# Patient Record
Sex: Female | Born: 1940 | Race: White | Hispanic: No | Marital: Married | Smoking: Former smoker
Health system: Southern US, Community
[De-identification: ages and names within clinical notes are randomized; demographics above are authoritative.]

## PROBLEM LIST (undated history)

## (undated) DIAGNOSIS — G4733 Obstructive sleep apnea (adult) (pediatric): Secondary | ICD-10-CM

## (undated) DIAGNOSIS — T7840XA Allergy, unspecified, initial encounter: Secondary | ICD-10-CM

## (undated) DIAGNOSIS — H269 Unspecified cataract: Secondary | ICD-10-CM

## (undated) DIAGNOSIS — Z85828 Personal history of other malignant neoplasm of skin: Secondary | ICD-10-CM

## (undated) DIAGNOSIS — H409 Unspecified glaucoma: Secondary | ICD-10-CM

## (undated) DIAGNOSIS — F419 Anxiety disorder, unspecified: Secondary | ICD-10-CM

## (undated) DIAGNOSIS — H9190 Unspecified hearing loss, unspecified ear: Secondary | ICD-10-CM

## (undated) DIAGNOSIS — K219 Gastro-esophageal reflux disease without esophagitis: Secondary | ICD-10-CM

## (undated) DIAGNOSIS — H35039 Hypertensive retinopathy, unspecified eye: Secondary | ICD-10-CM

## (undated) DIAGNOSIS — F32A Depression, unspecified: Secondary | ICD-10-CM

## (undated) DIAGNOSIS — E785 Hyperlipidemia, unspecified: Secondary | ICD-10-CM

## (undated) DIAGNOSIS — G43909 Migraine, unspecified, not intractable, without status migrainosus: Secondary | ICD-10-CM

## (undated) DIAGNOSIS — G4730 Sleep apnea, unspecified: Secondary | ICD-10-CM

## (undated) DIAGNOSIS — H9319 Tinnitus, unspecified ear: Secondary | ICD-10-CM

## (undated) DIAGNOSIS — F329 Major depressive disorder, single episode, unspecified: Secondary | ICD-10-CM

## (undated) DIAGNOSIS — Z9989 Dependence on other enabling machines and devices: Principal | ICD-10-CM

## (undated) HISTORY — DX: Personal history of other malignant neoplasm of skin: Z85.828

## (undated) HISTORY — DX: Unspecified cataract: H26.9

## (undated) HISTORY — DX: Unspecified glaucoma: H40.9

## (undated) HISTORY — DX: Migraine, unspecified, not intractable, without status migrainosus: G43.909

## (undated) HISTORY — DX: Sleep apnea, unspecified: G47.30

## (undated) HISTORY — DX: Depression, unspecified: F32.A

## (undated) HISTORY — DX: Tinnitus, unspecified ear: H93.19

## (undated) HISTORY — PX: EYE SURGERY: SHX253

## (undated) HISTORY — DX: Hypertensive retinopathy, unspecified eye: H35.039

## (undated) HISTORY — DX: Gastro-esophageal reflux disease without esophagitis: K21.9

## (undated) HISTORY — DX: Anxiety disorder, unspecified: F41.9

## (undated) HISTORY — PX: CATARACT EXTRACTION: SUR2

## (undated) HISTORY — DX: Dependence on other enabling machines and devices: Z99.89

## (undated) HISTORY — DX: Unspecified hearing loss, unspecified ear: H91.90

## (undated) HISTORY — PX: OTHER SURGICAL HISTORY: SHX169

## (undated) HISTORY — DX: Allergy, unspecified, initial encounter: T78.40XA

## (undated) HISTORY — DX: Hyperlipidemia, unspecified: E78.5

## (undated) HISTORY — PX: COSMETIC SURGERY: SHX468

## (undated) HISTORY — DX: Major depressive disorder, single episode, unspecified: F32.9

## (undated) HISTORY — DX: Obstructive sleep apnea (adult) (pediatric): G47.33

---

## 1998-10-28 ENCOUNTER — Other Ambulatory Visit: Admission: RE | Admit: 1998-10-28 | Discharge: 1998-10-28 | Payer: Self-pay | Admitting: Obstetrics and Gynecology

## 1999-09-23 ENCOUNTER — Other Ambulatory Visit: Admission: RE | Admit: 1999-09-23 | Discharge: 1999-09-23 | Payer: Self-pay | Admitting: Obstetrics and Gynecology

## 2000-04-26 ENCOUNTER — Ambulatory Visit (HOSPITAL_COMMUNITY): Admission: RE | Admit: 2000-04-26 | Discharge: 2000-04-26 | Payer: Self-pay | Admitting: Gastroenterology

## 2000-09-27 ENCOUNTER — Other Ambulatory Visit: Admission: RE | Admit: 2000-09-27 | Discharge: 2000-09-27 | Payer: Self-pay | Admitting: Obstetrics and Gynecology

## 2001-10-06 ENCOUNTER — Other Ambulatory Visit: Admission: RE | Admit: 2001-10-06 | Discharge: 2001-10-06 | Payer: Self-pay | Admitting: Obstetrics and Gynecology

## 2002-05-18 ENCOUNTER — Ambulatory Visit (HOSPITAL_COMMUNITY): Admission: RE | Admit: 2002-05-18 | Discharge: 2002-05-18 | Payer: Self-pay | Admitting: Neurology

## 2002-05-22 ENCOUNTER — Ambulatory Visit (HOSPITAL_COMMUNITY): Admission: RE | Admit: 2002-05-22 | Discharge: 2002-05-22 | Payer: Self-pay | Admitting: Ophthalmology

## 2002-05-22 ENCOUNTER — Encounter: Payer: Self-pay | Admitting: Ophthalmology

## 2002-11-08 ENCOUNTER — Other Ambulatory Visit: Admission: RE | Admit: 2002-11-08 | Discharge: 2002-11-08 | Payer: Self-pay | Admitting: Obstetrics and Gynecology

## 2003-11-21 ENCOUNTER — Other Ambulatory Visit: Admission: RE | Admit: 2003-11-21 | Discharge: 2003-11-21 | Payer: Self-pay | Admitting: Obstetrics and Gynecology

## 2004-12-25 ENCOUNTER — Other Ambulatory Visit: Admission: RE | Admit: 2004-12-25 | Discharge: 2004-12-25 | Payer: Self-pay | Admitting: Obstetrics and Gynecology

## 2005-08-28 ENCOUNTER — Ambulatory Visit (HOSPITAL_COMMUNITY): Admission: RE | Admit: 2005-08-28 | Discharge: 2005-08-28 | Payer: Self-pay | Admitting: Gastroenterology

## 2005-12-29 ENCOUNTER — Other Ambulatory Visit: Admission: RE | Admit: 2005-12-29 | Discharge: 2005-12-29 | Payer: Self-pay | Admitting: Obstetrics and Gynecology

## 2007-01-19 ENCOUNTER — Other Ambulatory Visit: Admission: RE | Admit: 2007-01-19 | Discharge: 2007-01-19 | Payer: Self-pay | Admitting: Obstetrics and Gynecology

## 2008-02-17 ENCOUNTER — Other Ambulatory Visit: Admission: RE | Admit: 2008-02-17 | Discharge: 2008-02-17 | Payer: Self-pay | Admitting: Gynecology

## 2009-02-27 ENCOUNTER — Other Ambulatory Visit: Admission: RE | Admit: 2009-02-27 | Discharge: 2009-02-27 | Payer: Self-pay | Admitting: Obstetrics and Gynecology

## 2011-03-20 NOTE — Op Note (Signed)
NAME:  Nicole Holt, Nicole Holt                 ACCOUNT NO.:  [id number]   MEDICAL RECORD NO.:  [id number]          PATIENT TYPE:  AMB   LOCATION:  ENDO                         FACILITY:  [facility]   PHYSICIAN:  John C. Madilyn Fireman, M.D.    DATE OF BIRTH:  02-Jul-1941   DATE OF PROCEDURE:  08/28/2005  DATE OF DISCHARGE:                                 OPERATIVE REPORT   PROCEDURE:  Colonoscopy.   INDICATIONS FOR PROCEDURE:  Family history of colon cancer in a first-degree  relative.   PROCEDURE:  The patient was placed in the left lateral decubitus position  then placed on the pulse monitor with continuous low-flow oxygen delivered  by nasal cannula. She was sedated with 75 mcg IV fentanyl  and 7 mg IV  Versed. The Olympus video colonoscope was inserted into the rectum and  advanced to the cecum, confirmed by transillumination at McBurney's point  and visualization of the ileocecal valve and appendiceal orifice. The prep  was excellent. The cecum, ascending, and transverse colon all appeared  normal with no masses, polyps, diverticula or other mucosal abnormalities.  There were a few scattered diverticula noted in the descending and sigmoid  colon which were otherwise normal. The rectum appeared normal. On  retroflexed view, the anus revealed no obvious internal hemorrhoids. The  scope was then withdrawn and the patient returned to the recovery room in  stable condition. She tolerated the procedure well and there were no  immediate complications.   IMPRESSION:  Diverticulosis otherwise normal study.   PLAN:  Repeat colonoscopy in 5 years.           ______________________________  Everardo All Madilyn Fireman, M.D.     J[facility]/MEDQ  D:  08/28/2005  T:  08/28/2005  Job:  [id number]   cc:   Harrel Lemon. Merla Riches, M.D.  Fax: [phone]

## 2011-03-20 NOTE — Procedures (Signed)
[facility]  Patient:    Nicole Holt, Nicole Holt                        MRN: [id number] Proc. Date: 04/26/00 Adm. Date:  [id number] Attending:  Louie Bun CC:         Edwena Felty. Ashley Royalty, M.D.                           Procedure Report  PROCEDURE PERFORMED:  Colonoscopy.  ENDOSCOPIST:  Everardo All. Madilyn Fireman, M.D.  INDICATION FOR PROCEDURE:  Intermittent rectal bleeding in a 70 year old patient with no prior colon screening.  DESCRIPTION OF PROCEDURE:  The patient was placed in the left lateral decubitus position and placed on the pulse monitor with continuous low-flow oxygen delivered by nasal cannula.  She was sedated with 75 mg IV Demerol and 7 mg IV Versed.  The Olympus video colonoscope was inserted into the rectum and advanced to the cecum, confirmed by transillumination of McBurneys point and visualization of the ileocecal valve and the appendiceal orifice.  The prep was excellent.  The cecum, ascending, transverse, descending and sigmoid colon all appeared normal with no masses, polyps, diverticula or other mucosal abnormalities.  The rectum likewise appeared normal and a retroflexed view of the anus did reveal some small internal hemorrhoids.  The colonoscope was then withdrawn and the patient returned to the recovery room in stable condition. She tolerated the procedure well and there were no immediate complications.  IMPRESSION:  Internal hemorrhoid; otherwise, normal colonoscopy. DD:  04/26/00 TD:  04/27/00 Job: [id number]

## 2012-01-18 ENCOUNTER — Ambulatory Visit (INDEPENDENT_AMBULATORY_CARE_PROVIDER_SITE_OTHER): Payer: Medicare PPO | Admitting: Family Medicine

## 2012-01-18 VITALS — BP 140/80 | HR 86 | Temp 97.7°F | Resp 18 | Ht 62.75 in | Wt 156.4 lb

## 2012-01-18 DIAGNOSIS — J189 Pneumonia, unspecified organism: Secondary | ICD-10-CM

## 2012-01-18 MED ORDER — HYDROCODONE-HOMATROPINE 5-1.5 MG/5ML PO SYRP: 5.0000 mL | ORAL_SOLUTION | Freq: Four times a day (QID) | ORAL | Status: DC | PRN

## 2012-01-18 MED ORDER — DOXYCYCLINE HYCLATE 100 MG PO TABS: 100.0000 mg | ORAL_TABLET | Freq: Two times a day (BID) | ORAL | Status: AC

## 2012-01-18 NOTE — Progress Notes (Signed)
2 weeks ago had N,V,D.  Was okay for a week.  Then last week developed myalgias, cough, and chills.  Associated with sore throat.  Cough is violent.  After the cough, patient feels exhausted.  She thinks that the temperature is going up at night.  She's using her hydromet from a year ago, and it is helping.  O:  NAD  Color good Oroph:  Clear TM's normal Neck:  Supple without adenopathy Chest:  Few wheezes, bibasilar rales.  Violent cough  A:  Bronchitis, early pneumonia  P:  Doxycycline 100 bid hydromet

## 2012-02-06 ENCOUNTER — Other Ambulatory Visit: Payer: Self-pay | Admitting: Internal Medicine

## 2012-06-09 ENCOUNTER — Encounter: Payer: Self-pay | Admitting: Obstetrics & Gynecology

## 2012-06-09 ENCOUNTER — Ambulatory Visit (INDEPENDENT_AMBULATORY_CARE_PROVIDER_SITE_OTHER): Payer: Self-pay | Admitting: Obstetrics & Gynecology

## 2012-06-09 VITALS — BP 138/88 | HR 81 | Temp 97.6°F | Ht 63.0 in | Wt 154.9 lb

## 2012-06-09 DIAGNOSIS — Z01419 Encounter for gynecological examination (general) (routine) without abnormal findings: Secondary | ICD-10-CM

## 2012-06-09 DIAGNOSIS — Z0000 Encounter for general adult medical examination without abnormal findings: Secondary | ICD-10-CM

## 2012-06-09 NOTE — Progress Notes (Signed)
Subjective:    Nicole Holt is a 71 y.o. female who presents for an annual exam. The patient has no complaints today. The patient is sexually active. GYN screening history: last pap: was normal. The patient wears seatbelts: yes. The patient participates in regular exercise: yes. Has the patient ever been transfused or tattooed?: not asked. The patient reports that there is not domestic violence in her life.   Menstrual History: OB History    Grav Para Term Preterm Abortions TAB SAB Ect Mult Living   2 2 2  0 0 0 0 0 0 2      Menarche age: 44 No LMP recorded. Patient is postmenopausal.    The following portions of the patient's history were reviewed and updated as appropriate: allergies, current medications, past family history, past medical history, past social history, past surgical history and problem list.  Review of Systems Pertinent items are noted in HPI.    Objective:    BP 138/88  Pulse 81  Temp 97.6 F (36.4 C)  Ht 5\' 3"  (1.6 m)  Wt 154 lb 14.4 oz (70.262 kg)  BMI 27.44 kg/m2  General Appearance:    Alert, cooperative, no distress, appears stated age  Head:    Normocephalic, without obvious abnormality, atraumatic  Eyes:    PERRL, conjunctiva/corneas clear, EOM's intact, fundi    benign, both eyes  Ears:    Normal TM's and external ear canals, both ears  Nose:   Nares normal, septum midline, mucosa normal, no drainage    or sinus tenderness  Throat:   Lips, mucosa, and tongue normal; teeth and gums normal  Neck:   Supple, symmetrical, trachea midline, no adenopathy;    thyroid:  no enlargement/tenderness/nodules; no carotid   bruit or JVD  Back:     Symmetric, no curvature, ROM normal, no CVA tenderness  Lungs:     Clear to auscultation bilaterally, respirations unlabored  Chest Wall:    No tenderness or deformity   Heart:    Regular rate and rhythm, S1 and S2 normal, no murmur, rub   or gallop  Breast Exam:    No tenderness, masses, or nipple abnormality    Abdomen:     Soft, non-tender, bowel sounds active all four quadrants,    no masses, no organomegaly  Genitalia:    Normal female without lesion, discharge or tenderness, moderate atrophy, NSSA, NT, no adnexal masses  Rectal:    Small hemorrhoid   Extremities:   Extremities normal, atraumatic, no cyanosis or edema  Pulses:   2+ and symmetric all extremities  Skin:   Skin color, texture, turgor normal, no rashes or lesions  Lymph nodes:   Cervical, supraclavicular, and axillary nodes normal  Neurologic:   CNII-XII intact, normal strength, sensation and reflexes    throughout  .    Assessment:    Healthy female exam.    Plan:     Mammogram.

## 2012-06-16 ENCOUNTER — Other Ambulatory Visit: Payer: Self-pay | Admitting: Internal Medicine

## 2012-06-16 NOTE — Telephone Encounter (Signed)
Please pull chart.

## 2012-06-16 NOTE — Telephone Encounter (Signed)
Patient's chart is at the nurses station in the pa pool.

## 2012-07-27 ENCOUNTER — Encounter: Payer: Self-pay | Admitting: Internal Medicine

## 2012-07-27 ENCOUNTER — Ambulatory Visit (INDEPENDENT_AMBULATORY_CARE_PROVIDER_SITE_OTHER): Payer: Medicare PPO | Admitting: Internal Medicine

## 2012-07-27 VITALS — BP 130/70 | HR 60 | Temp 97.8°F | Resp 16 | Ht 62.0 in | Wt 154.6 lb

## 2012-07-27 DIAGNOSIS — C4491 Basal cell carcinoma of skin, unspecified: Secondary | ICD-10-CM

## 2012-07-27 DIAGNOSIS — H8110 Benign paroxysmal vertigo, unspecified ear: Secondary | ICD-10-CM

## 2012-07-27 DIAGNOSIS — H409 Unspecified glaucoma: Secondary | ICD-10-CM

## 2012-07-27 DIAGNOSIS — H9319 Tinnitus, unspecified ear: Secondary | ICD-10-CM

## 2012-07-27 DIAGNOSIS — Z0000 Encounter for general adult medical examination without abnormal findings: Secondary | ICD-10-CM

## 2012-07-27 DIAGNOSIS — G2581 Restless legs syndrome: Secondary | ICD-10-CM

## 2012-07-27 DIAGNOSIS — Z6828 Body mass index (BMI) 28.0-28.9, adult: Secondary | ICD-10-CM

## 2012-07-27 DIAGNOSIS — G4730 Sleep apnea, unspecified: Secondary | ICD-10-CM

## 2012-07-27 DIAGNOSIS — F411 Generalized anxiety disorder: Secondary | ICD-10-CM

## 2012-07-27 DIAGNOSIS — H9190 Unspecified hearing loss, unspecified ear: Secondary | ICD-10-CM | POA: Insufficient documentation

## 2012-07-27 DIAGNOSIS — Z23 Encounter for immunization: Secondary | ICD-10-CM

## 2012-07-27 DIAGNOSIS — M810 Age-related osteoporosis without current pathological fracture: Secondary | ICD-10-CM

## 2012-07-27 DIAGNOSIS — F419 Anxiety disorder, unspecified: Secondary | ICD-10-CM

## 2012-07-27 LAB — COMPREHENSIVE METABOLIC PANEL
ALT: 17 U/L (ref 0–35)
AST: 21 U/L (ref 0–37)
BUN: 21 mg/dL (ref 6–23)
CO2: 25 mEq/L (ref 19–32)
Creat: 0.88 mg/dL (ref 0.50–1.10)
Total Bilirubin: 0.7 mg/dL (ref 0.3–1.2)

## 2012-07-27 LAB — LIPID PANEL
Cholesterol: 189 mg/dL (ref 0–200)
HDL: 51 mg/dL (ref >39)
Total CHOL/HDL Ratio: 3.7 Ratio
VLDL: 35 mg/dL (ref 0–40)

## 2012-07-27 LAB — CBC WITH DIFFERENTIAL/PLATELET
Basophils Absolute: 0 10*3/uL (ref 0.0–0.1)
Eosinophils Absolute: 0.1 10*3/uL (ref 0.0–0.7)
Eosinophils Relative: 1 % (ref 0–5)
MCH: 30.5 pg (ref 26.0–34.0)
MCV: 90.9 fL (ref 78.0–100.0)
Monocytes Absolute: 0.5 10*3/uL (ref 0.1–1.0)
Platelets: 284 10*3/uL (ref 150–400)
RDW: 14.4 % (ref 11.5–15.5)

## 2012-07-27 LAB — POCT URINALYSIS DIPSTICK
Blood, UA: NEGATIVE
Ketones, UA: NEGATIVE
Protein, UA: NEGATIVE
Spec Grav, UA: 1.01
Urobilinogen, UA: 0.2
pH, UA: 8

## 2012-07-27 MED ORDER — PNEUMOCOCCAL VAC POLYVALENT 25 MCG/0.5ML IJ INJ: 0.5000 mL | INJECTION | INTRAMUSCULAR | Status: DC

## 2012-07-27 MED ORDER — DIAZEPAM 5 MG PO TABS: 5.0000 mg | ORAL_TABLET | Freq: Four times a day (QID) | ORAL | Status: DC | PRN

## 2012-07-27 NOTE — Progress Notes (Addendum)
Subjective:    Patient ID: Nicole Holt, female    DOB: [birth year], 71 y.o.   MRN: [id number]  HPI Pt. here for F/U/routine CPE She states that this year has been going great for her.  She switched insurance plans and needed to change gynecologists, but has been able to locate a new provider.  Pt. Has not used the rx given to her for the shingles vaccine, she is undecided if this is something she will get.  She is also not interested in receiving the flu shot, but she is interested interested in receiving the pneumonia vaccine.  She takes her Valium less than before, but still uses is when she has to spend time around her family.  She also mentions she is concerned she might have sleep apnea and c/o loud snoring at night.  She shares that she has seen her dermatologist and only has one area of concern on her nose that her dermatologist is following.   Tet 10/04/08 Declines flu vaccine because husband got GuillianBarre Next depression screen negative Fall risk drain negative Colonoscopy 2012 [facility] DEXA scan per her Gynecology Dr. Alric Ran to continue Actonel in 2003 and/continues with calcium and vitamin D supplementation/Pap and mammogram up-to-date Declines zoster vaccine Ophthalmology-Whittaker/Drops for glaucoma/travatan Living will/organ donor Confirm nonsmoker very Active with volunteer activities//Active exerciser//good diet only needs anxiety medication when she has to visit with family around the care of her mother Still a live = age 71  Family history-father had kidney failure and congestive heart failure-mother with TIA-sister with rheumatoid arthritis and Parkinson's disease, polymyalgia rheumatica and history of TIA or stroke secondary to patent foramen ovale Happily married/1 daily  Review of Systems Tinnitus - chronic since 2001 Pt. Now using hearing aids (digital) Neuro: Sleep - Snoring loud, feels numb and tingling, wakes up feeling non-restored, falls asleep  watching TV,is not tired while driving Skin :Nose lesion, MOHS sx Oct 2013 Remainder 14 systems total noncontributory    Objective:   Physical Exam Vitals:  Filed Vitals:   07/27/12 1216  BP: 130/70  Pulse: 60  Temp: 97.8 F (36.6 C)  Resp: 16  Weight 154 pounds  General: Pt. is happy and conversational and appears younger than her age. Constitutional:No acute distress HEENT: Intact Though nares boggy No thyromegaly or lymphadenopathy  Respiratory: CTA bilaterally CV: RRR Without murmurs/no carotid bruits/ GI: Abdomen normal contour, No abdominal bruits, BSx4, non-tender to palpation, no masses, No organomegaly MSK: Normal bulk and tone, full ROM Major joints Skin: Red lesion on bridge of nose (prior biopsy)--Basal cell cancer with Mohs surgery pending Neurological intact Psychiatric intact         Assessment & Plan:  Routine physical examination age 71 1. BMI 28.0-28.9,adult    2. Sleep apnea-Possible    3. Tinnitus    4. Hearing loss -Has new digital hearing aids   5. Basal cell cancer -Scheduled for surgery Dr Myracle-Brassfield  6. Need for prophylactic vaccination against Streptococcus pneumoniae (pneumococcus)    7. Osteopenia   8. Glaucoma -Good ophthalmologic followup   9. Restless leg syndrome    10. Anxiety -Stable   11. Benign positional vertigo -Stable    -Discussed pneumonia and shingles vaccine with pt. -Administer Pneumovax  -Advised pt. To use hydrocortisone cream if new hearing aids irritate skin. -Pt. Education re: sleep apnea and offered sleep study -Schedule polysomnogram -Refill Valium -Continue calcium and vitamin D Meds ordered this encounter  Medications  . diazepam (VALIUM) 5 MG tablet  Sig: Take 1 tablet (5 mg total) by mouth every 6 (six) hours as needed for anxiety.    Dispense:  30 tablet    Refill:  5  . pneumococcal 23 valent vaccine (PNU-IMMUNE) injection 0.5 mL    Sig:    Routine labs

## 2012-07-27 NOTE — Progress Notes (Deleted)
  Subjective:    Patient ID: Nicole Holt, female    DOB: 05-Jun-1941, 71 y.o.   MRN: [id number]  HPI    Review of Systems  Constitutional: Negative.   HENT: Positive for tinnitus.   Eyes: Negative.   Respiratory: Positive for apnea.   Skin: Positive for color change.  Psychiatric/Behavioral: Positive for disturbed wake/sleep cycle.       Objective:   Physical Exam        Assessment & Plan:

## 2012-07-28 ENCOUNTER — Encounter: Payer: Self-pay | Admitting: Internal Medicine

## 2012-07-28 DIAGNOSIS — H8110 Benign paroxysmal vertigo, unspecified ear: Secondary | ICD-10-CM | POA: Insufficient documentation

## 2012-07-28 DIAGNOSIS — G2581 Restless legs syndrome: Secondary | ICD-10-CM | POA: Insufficient documentation

## 2012-07-28 DIAGNOSIS — M810 Age-related osteoporosis without current pathological fracture: Secondary | ICD-10-CM | POA: Insufficient documentation

## 2012-07-28 DIAGNOSIS — F419 Anxiety disorder, unspecified: Secondary | ICD-10-CM | POA: Insufficient documentation

## 2012-07-28 DIAGNOSIS — H409 Unspecified glaucoma: Secondary | ICD-10-CM | POA: Insufficient documentation

## 2012-08-19 DIAGNOSIS — Z85828 Personal history of other malignant neoplasm of skin: Secondary | ICD-10-CM

## 2012-08-19 HISTORY — DX: Personal history of other malignant neoplasm of skin: Z85.828

## 2012-08-22 ENCOUNTER — Ambulatory Visit (HOSPITAL_COMMUNITY)
Admission: RE | Admit: 2012-08-22 | Discharge: 2012-08-22 | Disposition: A | Discharge status: Home / Self Care | Payer: Medicare PPO | Source: Ambulatory Visit | Attending: Obstetrics & Gynecology | Admitting: Obstetrics & Gynecology

## 2012-08-22 DIAGNOSIS — Z0000 Encounter for general adult medical examination without abnormal findings: Secondary | ICD-10-CM

## 2012-08-22 DIAGNOSIS — Z1231 Encounter for screening mammogram for malignant neoplasm of breast: Secondary | ICD-10-CM

## 2013-01-26 ENCOUNTER — Encounter: Payer: Self-pay | Admitting: Internal Medicine

## 2013-03-08 ENCOUNTER — Other Ambulatory Visit: Payer: Self-pay | Admitting: Internal Medicine

## 2013-07-04 ENCOUNTER — Other Ambulatory Visit: Payer: Self-pay | Admitting: Internal Medicine

## 2013-08-02 ENCOUNTER — Encounter: Payer: Self-pay | Admitting: Neurology

## 2013-08-30 ENCOUNTER — Ambulatory Visit (INDEPENDENT_AMBULATORY_CARE_PROVIDER_SITE_OTHER): Payer: Medicare PPO | Admitting: Internal Medicine

## 2013-08-30 ENCOUNTER — Encounter: Payer: Self-pay | Admitting: Internal Medicine

## 2013-08-30 VITALS — BP 124/64 | HR 64 | Temp 98.0°F | Resp 16 | Ht 62.0 in | Wt 159.2 lb

## 2013-08-30 DIAGNOSIS — H9313 Tinnitus, bilateral: Secondary | ICD-10-CM

## 2013-08-30 DIAGNOSIS — M810 Age-related osteoporosis without current pathological fracture: Secondary | ICD-10-CM

## 2013-08-30 DIAGNOSIS — Z0000 Encounter for general adult medical examination without abnormal findings: Secondary | ICD-10-CM

## 2013-08-30 DIAGNOSIS — Z6828 Body mass index (BMI) 28.0-28.9, adult: Secondary | ICD-10-CM

## 2013-08-30 DIAGNOSIS — F419 Anxiety disorder, unspecified: Secondary | ICD-10-CM

## 2013-08-30 DIAGNOSIS — H9193 Unspecified hearing loss, bilateral: Secondary | ICD-10-CM

## 2013-08-30 DIAGNOSIS — H409 Unspecified glaucoma: Secondary | ICD-10-CM

## 2013-08-30 NOTE — Progress Notes (Signed)
Subjective:    Patient ID: Nicole Holt, female    DOB: June 14, 1941, 72 y.o.   MRN: [id number]  HPI Patient presents today for CPE. Patient Active Problem List   Diagnosis Date Noted  . Osteoporosis 07/28/2012  . Glaucoma 07/28/2012  . Restless leg syndrome 07/28/2012  . Anxiety 07/28/2012  . Benign positional vertigo 07/28/2012  . BMI 28.0-28.9,adult 07/27/2012  . Tinnitus 07/27/2012  . Hearing loss 07/27/2012  . Basal cell cancer 07/27/2012  Current outpatient prescriptions:diazepam (VALIUM) 5 MG tablet, TAKE ONE TABLET BY MOUTH EVERY 6 HOURS AS NEEDED FOR ANXIETY, Disp: 30 tablet, Rfl: 0;  Travoprost, BAK Free, (TRAVATAN) 0.004 % SOLN ophthalmic solution, Place 1 drop into both eyes at bedtime., Disp: , Rfl: ;  HYDROcodone-homatropine (HYCODAN) 5-1.5 MG/5ML syrup, Take 5 mLs by mouth every 6 (six) hours as needed., Disp: 120 mL, Rfl: 1   Last CPE- 08/22/12 Last pap- 02/27/09 Last mammo-10/13 Dexa- 2011 Tetanus- 1/09 Pneumovax- 07/27/12  Had constipation about 6 months ago with hemorrhoids and some rectal bleeding. This is exacerbated by travel or not having adequate fluid intake. Not currently having problems.Last colonoscopy 2012.  Left index finger had some infection around nail. She lanced it and expressed the pus and it resolved.   Doing well with OSA CPAP treatment. Has been using since January with great results. Sleeps much better, no daytime sleeping.  Knees hurt bilaterally below kneecaps, left worse than right. Noticed this after using treadmill two days in a row. Has had problems off and on in the past and had xrays, was diagnosed with tendonitis.   Her mother is still alive and living in a nursing home. Relationship improved.   Has learned to live with tinnitus. Had hearing aids adjusted from [facility] which helps with background noise.   Review of Systems  Constitutional: Negative.   HENT: Positive for hearing loss and tinnitus.   Eyes: Negative.   Respiratory:  Negative.   Cardiovascular: Negative.   Gastrointestinal: Positive for abdominal pain, constipation and anal bleeding.  Endocrine: Negative.   Genitourinary: Negative.   Musculoskeletal: Positive for arthralgias.       Knees hurt  Skin: Negative.   Allergic/Immunologic: Negative.   Neurological: Negative.   Hematological: Negative.   Psychiatric/Behavioral: Positive for agitation. The patient is nervous/anxious.        Objective:   Physical Exam  Nursing note and vitals reviewed. Constitutional: She is oriented to person, place, and time. She appears well-developed and well-nourished. No distress.  HENT:  Right Ear: External ear normal.  Left Ear: External ear normal.  Nose: Nose normal.  Mouth/Throat: Oropharynx is clear and moist.  Eyes: Conjunctivae are normal. Right eye exhibits no discharge. Left eye exhibits no discharge.  Neck: Normal range of motion. Neck supple. No thyromegaly present.  Cardiovascular: Normal rate, regular rhythm, normal heart sounds and intact distal pulses.   Pulmonary/Chest: Effort normal and breath sounds normal. Right breast exhibits no inverted nipple, no mass, no nipple discharge, no skin change and no tenderness. Left breast exhibits no inverted nipple, no mass, no nipple discharge, no skin change and no tenderness.  Abdominal: Soft. Bowel sounds are normal. She exhibits no distension and no mass. There is no tenderness. There is no rebound and no guarding.  Musculoskeletal: Normal range of motion. She exhibits no edema and no tenderness.  Lymphadenopathy:    She has no cervical adenopathy.  Neurological: She is alert and oriented to person, place, and time. She has normal reflexes.  Skin: Skin is warm and dry. She is not diaphoretic.  Psychiatric: She has a normal mood and affect. Her behavior is normal. Judgment and thought content normal.      Assessment & Plan:  Annual exam Routine general medical examination at a health care  facility  Anxiety  BMI 28.0-28.9,adult---focus on loss  Glaucoma  Hearing loss, bilateral---aids recently recalibr  Osteoporosis---has improved to osteopenia///will cont w/ D Ca  Tinnitus, bilateral---no chg  Meds ordered this encounter  Medications  . diazepam (VALIUM) 5 MG tablet    Sig: TAKE ONE TABLET BY MOUTH EVERY 6 HOURS AS NEEDED FOR ANXIETY    Dispense:  30 tablet    Refill:  1

## 2013-09-01 MED ORDER — DIAZEPAM 5 MG PO TABS: ORAL_TABLET | ORAL | Status: DC

## 2013-10-03 ENCOUNTER — Other Ambulatory Visit: Payer: Self-pay | Admitting: Internal Medicine

## 2013-10-03 DIAGNOSIS — Z1231 Encounter for screening mammogram for malignant neoplasm of breast: Secondary | ICD-10-CM

## 2013-10-12 ENCOUNTER — Ambulatory Visit (HOSPITAL_COMMUNITY)
Admission: RE | Admit: 2013-10-12 | Discharge: 2013-10-12 | Disposition: A | Discharge status: Home / Self Care | Payer: Medicare PPO | Source: Ambulatory Visit | Attending: Internal Medicine | Admitting: Internal Medicine

## 2013-10-12 DIAGNOSIS — Z1231 Encounter for screening mammogram for malignant neoplasm of breast: Secondary | ICD-10-CM | POA: Insufficient documentation

## 2014-02-01 ENCOUNTER — Encounter: Payer: Self-pay | Admitting: *Deleted

## 2014-02-02 ENCOUNTER — Encounter: Payer: Self-pay | Admitting: Neurology

## 2014-02-02 ENCOUNTER — Ambulatory Visit (INDEPENDENT_AMBULATORY_CARE_PROVIDER_SITE_OTHER): Payer: Commercial Managed Care - HMO | Admitting: Neurology

## 2014-02-02 ENCOUNTER — Encounter (INDEPENDENT_AMBULATORY_CARE_PROVIDER_SITE_OTHER): Payer: Self-pay

## 2014-02-02 VITALS — BP 111/72 | HR 79 | Resp 16 | Ht 62.0 in | Wt 167.0 lb

## 2014-02-02 DIAGNOSIS — Z9989 Dependence on other enabling machines and devices: Principal | ICD-10-CM

## 2014-02-02 DIAGNOSIS — G4733 Obstructive sleep apnea (adult) (pediatric): Secondary | ICD-10-CM | POA: Insufficient documentation

## 2014-02-02 DIAGNOSIS — G252 Other specified forms of tremor: Secondary | ICD-10-CM

## 2014-02-02 DIAGNOSIS — G478 Other sleep disorders: Secondary | ICD-10-CM | POA: Insufficient documentation

## 2014-02-02 DIAGNOSIS — E669 Obesity, unspecified: Secondary | ICD-10-CM

## 2014-02-02 DIAGNOSIS — G250 Essential tremor: Secondary | ICD-10-CM

## 2014-02-02 HISTORY — DX: Obstructive sleep apnea (adult) (pediatric): G47.33

## 2014-02-02 NOTE — Patient Instructions (Signed)

## 2014-02-02 NOTE — Progress Notes (Signed)
[facility]  Provider:  Larey Seat, M D  Referring Provider: Leandrew Koyanagi, MD Primary Care Physician:  Leandrew Koyanagi, MD  Chief Complaint  Patient presents with  . Follow-up    Room 11  . Sleep consult    HPI:  Nicole Holt is a 73 y.o. female  Is seen here as a revisit  from Dr. Laney Pastor for CPAP compliance.    Nicole Holt was seen last 12-2012.   Nicole Holt originally presented upon referral of Dr. Laney Pastor in 2013 . She is a right handed , chronically hoarse ,caucasian female patient , who  had reportedly loudly snoring and often woke herself from her snoring. She also woke up with a dry mouth and felt not  rested or restored in the mornings.  She gained weight , became less active.  Or roll prior to her going sleep evaluation she endorsed getting 6-7 hours of nocturnal sleep. She also has suffered from allergic rhinitis and used an Teaching laboratory technician. Her grandchildren had reportedly been bothered by her loud snoring , which made her seek medical advice. She endorsed the Epworth score at 14 of 21 points at the time ( 2013) .  The patient's sleep study in November 2013 documented AHI of 9.2 and RDI of 18, not SPLIT -  but she was hypoxic for over one hour at night.  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  She returned for a CPAP auto- titration and seemed to respond best to only 5 cm water pressure. 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 As she started on CPAP later that year , her rhinitis improved and she had no further sinusitis exacerbations.  She goes to bed at 11 Pm and falls asleep within 5 minutes, has a bathroom break at 2.30 AM and rarely another at 4.30. Unless she has a lot on her mind, she will easily  fall asleep again.  Total sleep time is now 7-8 hours nightly, no daytime naps.     Her Epworth sleepiness score had been reduced after she initiated CPAP he was and is currently at 6 points her fatigue severity score it on the 18 points , both greatly reduced. . Her geriatric depression score was at 3 points,  not indicative of depression.  Unable today to review the patient's compliance record she has 100% compliance on 180 date download, dated 02-02-2014.  The nightly use of 7 hours 55 minutes with CPAP her residual AHI is 0.9 at that but CPAP pressure of 5 cm water. She loves her CPAP !  No neck , face or ENT surgery history .   Review of Systems: Out of a complete 14 system review, the patient complains of only the following symptoms, and all other reviewed systems are negative. The patient just lost her mother and her sick pet, but is not depressed.   Family history : mother had dementia and died in 11/26/2013 at age 48, her sister has PD, at age 69.   History   Social History  . Marital Status: Married    Spouse Name: Legrand Como    Number of Children: 2  . Years of Education: 16   Occupational History  . Retired    Science writer  History Main Topics  . Smoking status: Former Research scientist (life sciences)  . Smokeless tobacco: Never Used     Comment: Quit in 1987  . Alcohol Use: 7.2 oz/week    12 Glasses of wine per week     Comment: wine or alchol daily  . Drug Use: No  . Sexual Activity: Yes   Other Topics Concern  . Not on file   Social History Narrative   Patient is married Legrand Como) and lives with her husband.   Patient is retired.   Patient has two children.   Patient is right-handed.   Patient has a college education.   Patient drinks two cups of coffee daily.             Family History  Problem Relation Age of Onset  . Osteoporosis Mother   . Stroke Mother   . Kidney disease Father   . Stroke Sister   . Congestive Heart Failure Father   . Arthritis/Rheumatoid    . Parkinson's  disease    . Polymyalgia rheumatica Sister     Past Medical History  Diagnosis Date  . Anxiety     panic attack  . Osteoporosis   . Depression   . Glaucoma   . Sleep apnea   . Hearing loss   . History of basal cell cancer 08/19/2012    Removed from nose  . Glaucoma   . Tinnitus   . Migraines     ocular  . OSA on CPAP 02/02/2014    PSG 09-27-2012 RDI 18.9 and AHi 9.8, Epworth 15 .titrated to only 5 cm water.     Past Surgical History  Procedure Laterality Date  . Cosmetic surgery      Current Outpatient Prescriptions  Medication Sig Dispense Refill  . diazepam (VALIUM) 5 MG tablet TAKE ONE TABLET BY MOUTH EVERY 6 HOURS AS NEEDED FOR ANXIETY  30 tablet  1  . Travoprost, BAK Free, (TRAVATAN) 0.004 % SOLN ophthalmic solution Place 1 drop into both eyes at bedtime.       No current facility-administered medications for this visit.    Allergies as of 02/02/2014 - Review Complete 02/02/2014  Allergen Reaction Noted  . Codeine Nausea Only 01/18/2012  . Estrogens conjugated Other (See Comments) 01/18/2012  . Paroxetine hcl Other (See Comments) 01/18/2012    Vitals: BP 111/72  Pulse 79  Resp 16  Ht 5\' 2"  (1.575 m)  Wt 167 lb (75.751 kg)  BMI 30.54 kg/m2 Last Weight:  Wt Readings from Last 1 Encounters:  02/02/14 167 lb (75.751 kg)   Last Height:   Ht Readings from Last 1 Encounters:  02/02/14 5\' 2"  (1.575 m)    Physical exam:  General: The patient is awake, alert and appears not in acute distress. The patient is well groomed. Head: Normocephalic, atraumatic. Neck is supple. Mallampati 2 , neck circumference:15.5 , tongue tremor , jaw tremor, tremolous speech.  Cardiovascular:  Regular rate and rhythm, without  murmurs or carotid bruit, and without distended neck veins. Respiratory: Lungs are clear to auscultation. Skin:  Without evidence of edema, or rash Trunk: BMI is  elevated . This  patient has normal posture.  Neurologic exam : The patient is awake and  alert, oriented to place and time.  Memory subjective described as intact.  There is a normal attention span & concentration ability. Speech is fluent without  dysarthria, dysphonia or aphasia. Mood and affect are appropriate.  Cranial nerves: Pupils are equal and briskly reactive to light.  Funduscopic exam without evidence of pallor or edema.  Extraocular movements  in vertical and horizontal planes intact and without nystagmus. Visual fields by finger perimetry are intact. Hearing to finger rub intact.  Facial sensation intact to fine touch. Facial motor strength is symmetric and tongue and uvula move midline.  Motor exam:   Normal tone , muscle bulk and symmetric normal strength in all extremities.  Sensory:  Fine touch, pinprick and vibration were normal.  Coordination: Rapid alternating movements in the fingers/hands is tested and normal. Finger-to-nose maneuver tested and with a mild  tremor.  Gait and station: Patient walks without assistive device . Strength within normal limits.  Deep tendon reflexes: in the  upper and lower extremities are symmetric and intact. Babinski maneuver response is  downgoing.   Assessment:  After physical and neurologic examination, review of laboratory studies, imaging, neurophysiology testing and pre-existing records, assessment is   1) OSA with UARS.  This is well controlled on the current CPAP setting of 5 cm. 100% compliance.  Humidifier can be self adjusted.   2) continued weight gain , low carb diet and exercise regimen discussed.   Plan:  Treatment plan and additional workup :Continue CPAP , RV in 12 month with machine , see NP Ward Givens  .

## 2014-03-04 ENCOUNTER — Other Ambulatory Visit: Payer: Self-pay | Admitting: Internal Medicine

## 2014-03-05 NOTE — Telephone Encounter (Signed)
faxed

## 2014-09-03 ENCOUNTER — Encounter: Payer: Self-pay | Admitting: Neurology

## 2014-09-12 ENCOUNTER — Encounter: Payer: Self-pay | Admitting: Internal Medicine

## 2014-09-12 ENCOUNTER — Ambulatory Visit (INDEPENDENT_AMBULATORY_CARE_PROVIDER_SITE_OTHER): Payer: Commercial Managed Care - HMO | Admitting: Internal Medicine

## 2014-09-12 VITALS — BP 130/76 | HR 86 | Temp 97.7°F | Resp 16 | Ht 61.5 in | Wt 164.0 lb

## 2014-09-12 DIAGNOSIS — Z026 Encounter for examination for insurance purposes: Secondary | ICD-10-CM

## 2014-09-12 DIAGNOSIS — Z136 Encounter for screening for cardiovascular disorders: Secondary | ICD-10-CM

## 2014-09-12 DIAGNOSIS — F419 Anxiety disorder, unspecified: Secondary | ICD-10-CM

## 2014-09-12 DIAGNOSIS — Z0000 Encounter for general adult medical examination without abnormal findings: Secondary | ICD-10-CM

## 2014-09-12 DIAGNOSIS — Z13220 Encounter for screening for lipoid disorders: Secondary | ICD-10-CM

## 2014-09-12 DIAGNOSIS — Z1211 Encounter for screening for malignant neoplasm of colon: Secondary | ICD-10-CM

## 2014-09-12 LAB — CBC WITH DIFFERENTIAL/PLATELET
BASOS PCT: 1 % (ref 0–1)
Basophils Absolute: 0 10*3/uL (ref 0.0–0.1)
Eosinophils Absolute: 0 10*3/uL (ref 0.0–0.7)
Eosinophils Relative: 1 % (ref 0–5)
HCT: 41.5 % (ref 36.0–46.0)
HEMOGLOBIN: 14.3 g/dL (ref 12.0–15.0)
LYMPHS ABS: 1.6 10*3/uL (ref 0.7–4.0)
Lymphocytes Relative: 32 % (ref 12–46)
MCH: 31.8 pg (ref 26.0–34.0)
MCHC: 34.5 g/dL (ref 30.0–36.0)
MCV: 92.4 fL (ref 78.0–100.0)
MONOS PCT: 8 % (ref 3–12)
Monocytes Absolute: 0.4 10*3/uL (ref 0.1–1.0)
NEUTROS ABS: 2.8 10*3/uL (ref 1.7–7.7)
NEUTROS PCT: 58 % (ref 43–77)
Platelets: 256 10*3/uL (ref 150–400)
RBC: 4.49 MIL/uL (ref 3.87–5.11)
RDW: 14.1 % (ref 11.5–15.5)
WBC: 4.9 10*3/uL (ref 4.0–10.5)

## 2014-09-12 LAB — COMPLETE METABOLIC PANEL WITH GFR
ALT: 21 U/L (ref 0–35)
AST: 24 U/L (ref 0–37)
Albumin: 4 g/dL (ref 3.5–5.2)
Alkaline Phosphatase: 61 U/L (ref 39–117)
BILIRUBIN TOTAL: 0.5 mg/dL (ref 0.2–1.2)
BUN: 15 mg/dL (ref 6–23)
CALCIUM: 8.8 mg/dL (ref 8.4–10.5)
CO2: 28 mEq/L (ref 19–32)
CREATININE: 0.87 mg/dL (ref 0.50–1.10)
Chloride: 104 mEq/L (ref 96–112)
GFR, EST NON AFRICAN AMERICAN: 67 mL/min
GFR, Est African American: 77 mL/min
Glucose, Bld: 87 mg/dL (ref 70–99)
Potassium: 4.6 mEq/L (ref 3.5–5.3)
Sodium: 142 mEq/L (ref 135–145)
Total Protein: 6.5 g/dL (ref 6.0–8.3)

## 2014-09-12 LAB — LIPID PANEL
CHOLESTEROL: 181 mg/dL (ref 0–200)
HDL: 55 mg/dL (ref >39)
LDL CALC: 96 mg/dL (ref 0–99)
Total CHOL/HDL Ratio: 3.3 Ratio
Triglycerides: 148 mg/dL (ref <150)
VLDL: 30 mg/dL (ref 0–40)
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
## 2014-09-12 NOTE — Progress Notes (Deleted)
   Subjective:    Patient ID: Nicole Holt, female    DOB: 1941-07-20, 73 y.o.   MRN: [id number]  HPI    Review of Systems  Constitutional: Positive for diaphoresis.  HENT: Positive for sneezing and tinnitus.   Eyes: Positive for discharge and redness.  Gastrointestinal: Positive for constipation.  Allergic/Immunologic: Positive for environmental allergies.  Psychiatric/Behavioral: Positive for agitation. The patient is nervous/anxious.        Objective:   Physical Exam        Assessment & Plan:

## 2014-09-13 LAB — TSH: TSH: 2.828 u[IU]/mL (ref 0.350–4.500)

## 2014-09-20 ENCOUNTER — Encounter: Payer: Self-pay | Admitting: Internal Medicine

## 2014-09-26 ENCOUNTER — Other Ambulatory Visit: Payer: Self-pay | Admitting: Internal Medicine

## 2014-09-28 NOTE — Telephone Encounter (Signed)
Almyra Free spoke to pt and put in p/up drawer.

## 2014-10-15 ENCOUNTER — Telehealth: Payer: Self-pay

## 2014-10-15 NOTE — Telephone Encounter (Signed)
Spoke to patient to encourage her to get her flu shot, however, patient states she does not get the flu shot.

## 2014-12-12 ENCOUNTER — Other Ambulatory Visit: Payer: Self-pay | Admitting: Internal Medicine

## 2014-12-16 NOTE — Telephone Encounter (Signed)
Ready

## 2014-12-17 NOTE — Telephone Encounter (Signed)
Faxed

## 2014-12-23 ENCOUNTER — Other Ambulatory Visit: Payer: Self-pay | Admitting: Internal Medicine

## 2014-12-25 NOTE — Telephone Encounter (Signed)
Faxed

## 2015-02-05 ENCOUNTER — Ambulatory Visit: Payer: Commercial Managed Care - HMO | Admitting: Neurology

## 2015-03-06 ENCOUNTER — Other Ambulatory Visit: Payer: Self-pay

## 2015-03-06 DIAGNOSIS — Z1231 Encounter for screening mammogram for malignant neoplasm of breast: Secondary | ICD-10-CM

## 2015-03-12 ENCOUNTER — Ambulatory Visit (INDEPENDENT_AMBULATORY_CARE_PROVIDER_SITE_OTHER): Payer: Commercial Managed Care - HMO | Admitting: Neurology

## 2015-03-12 ENCOUNTER — Encounter: Payer: Self-pay | Admitting: Neurology

## 2015-03-12 VITALS — BP 127/71 | HR 70 | Ht 62.6 in | Wt 169.0 lb

## 2015-03-12 DIAGNOSIS — G4733 Obstructive sleep apnea (adult) (pediatric): Secondary | ICD-10-CM | POA: Diagnosis not present

## 2015-03-12 DIAGNOSIS — E669 Obesity, unspecified: Secondary | ICD-10-CM | POA: Diagnosis not present

## 2015-03-12 DIAGNOSIS — G478 Other sleep disorders: Secondary | ICD-10-CM

## 2015-03-12 DIAGNOSIS — Z9989 Dependence on other enabling machines and devices: Principal | ICD-10-CM

## 2015-03-12 DIAGNOSIS — Z6830 Body mass index (BMI) 30.0-30.9, adult: Secondary | ICD-10-CM | POA: Insufficient documentation

## 2015-03-12 NOTE — Progress Notes (Signed)
[facility]  Provider:  Larey Seat, M D  Referring Provider: Leandrew Koyanagi, MD Primary Care Physician:  Leandrew Koyanagi, MD  Chief Complaint  Patient presents with  . Follow-up    cpap, rm 10, alone    HPI:  Nicole Holt is a 74 y.o. female  Is seen here as a revisit  from Dr. Laney Pastor for CPAP compliance.    Nicole Holt was seen last 12-2012.   Nicole Holt originally presented upon referral of Dr. Laney Pastor in 2013 . She is a right handed , chronically hoarse ,caucasian female patient , who  had reportedly loudly snoring and often woke herself from her snoring. She also woke up with a dry mouth and felt not  rested or restored in the mornings.  She gained weight , became less active. Or roll prior to her going sleep evaluation she endorsed getting 6-7 hours of nocturnal sleep. She also has suffered from allergic rhinitis and used an Teaching laboratory technician. Her grandchildren had reportedly been bothered by her loud snoring , which made her seek medical advice. She endorsed the Epworth score at 14 of 21 points at the time ( 2013) . The patient's sleep study in November 2013 documented AHI of 9.2 and RDI of 18, not SPLIT -  but she was hypoxic for over one hour at night.  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  She returned for a CPAP auto- titration and seemed to respond best to only 5 cm water pressure. 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 As she started on CPAP later that year , her rhinitis improved and she had no further sinusitis exacerbations.  She goes to bed at 11 Pm and falls asleep within 5 minutes, has a bathroom break at 2.30 AM and rarely another at 4.30. Unless she has a lot on her mind, she will easily fall asleep  again.  Total sleep time is now 7-8 hours nightly, no daytime naps.  Her Epworth sleepiness score had been reduced after she initiated CPAP he was and is currently at 6 points her fatigue severity score it on the 18 points , both greatly reduced. . Her geriatric depression score was at 3 points,  not indicative of depression.  Unable today to review the patient's compliance record she has 100% compliance on 180 date download, dated 02-02-2014. The nightly use of 7 hours 55 minutes with CPAP her residual AHI is 0.9 at that but CPAP pressure of 5 cm water. She loves her CPAP ! No neck , face or ENT surgery history .   Interval history since the patient underwent a CPAP titration on 09-27-12 as been followed yearly in our office.  Today is 03-12-15, the patient is currently under [facility] coverage and uses the only available DME, preop. She has a CPAP machine by [employer] system 1 with heated humidifier,  coiled air tubing and uses a S-M size nasal mask. She has still other size liners, but only this one fits.  She needs to reorder this size through upper area. Her compliance information is available today downloaded in office on with a date 03-11-15 she's 100% compliant forward 30 out of 30 days of use 100% for over 4  hours of daily use with an average user time of 8 hours and 17 minutes. Her average AHI is 0.8 and a CPAP pressure of only 5 cm water.   her spepech is non fluent at the beginning of today's visit, but improved. She looks for words.   Review of Systems: Out of a complete 14 system review, the patient complains of only the following symptoms, and all other reviewed systems are negative. Geriatric depression score was endorsed at 5 points which is borderline for depression,  the patient had  lost her mother, her by now 4 year old sister suffers from Parkinson's disease. Her Epworth sleepiness score is endorsed at 5 points and her fatigue severity score 27 points. She  continues an active lifestyle likes to travel and spends time at the beach. She easily cries.      History   Social History  . Marital Status: Married    Spouse Name: Legrand Como  . Number of Children: 2  . Years of Education: 16   Occupational History  . Retired    Social History Main Topics  . Smoking status: Former Research scientist (life sciences)  . Smokeless tobacco: Never Used     Comment: Quit in 1987  . Alcohol Use: 7.2 oz/week    12 Glasses of wine per week     Comment: wine or alchol daily  . Drug Use: No  . Sexual Activity: Yes   Other Topics Concern  . Not on file   Social History Narrative   Patient is married Legrand Como) and lives with her husband.   Patient is retired.   Patient has two children.   Patient is right-handed.   Patient has a college education.   Patient drinks two cups of coffee daily.             Family History  Problem Relation Age of Onset  . Osteoporosis Mother   . Stroke Mother   . Hyperlipidemia Mother   . Kidney disease Father   . Congestive Heart Failure Father   . Heart disease Father   . Hyperlipidemia Father   . Stroke Sister   . Arthritis/Rheumatoid    . Parkinson's disease    . Polymyalgia rheumatica Sister     Past Medical History  Diagnosis Date  . Anxiety     panic attack  . Osteoporosis   . Depression   . Glaucoma   . Sleep apnea   . Hearing loss   . History of basal cell cancer 08/19/2012    Removed from nose  . Glaucoma   . Tinnitus   . Migraines     ocular  . OSA on CPAP 02/02/2014    PSG 09-27-2012 RDI 18.9 and AHi 9.8, Epworth 15 .titrated to only 5 cm water.     Past Surgical History  Procedure Laterality Date  . Cosmetic surgery    . Cataract surgery      Current Outpatient Prescriptions  Medication Sig Dispense Refill  . diazepam (VALIUM) 5 MG tablet TAKE ONE TABLET BY MOUTH EVERY 6 HOURS AS NEEDED FOR ANXIETY 30 tablet 0  . Travoprost, BAK Free, (TRAVATAN) 0.004 % SOLN ophthalmic solution Place 1 drop into both  eyes at bedtime.     No current facility-administered medications for this visit.    Allergies as of 03/12/2015 - Review Complete 03/12/2015  Allergen Reaction Noted  . Codeine Nausea Only 01/18/2012  . Estrogens conjugated Other (See Comments) 01/18/2012  . Paroxetine hcl Other (See Comments) 01/18/2012  Vitals: BP 127/71 mmHg  Pulse 70  Ht 5' 2.6" (1.59 m)  Wt 169 lb (76.658 kg)  BMI 30.32 kg/m2 Last Weight:  Wt Readings from Last 1 Encounters:  03/12/15 169 lb (76.658 kg)   Last Height:   Ht Readings from Last 1 Encounters:  03/12/15 5' 2.6" (1.59 m)    Physical exam:  General: The patient is awake, alert and appears not in acute distress. The patient is well groomed. Head: Normocephalic, atraumatic.  Neck is supple. Mallampati 2 , neck circumference:15.5, tongue tremor, jaw tremor, tremolous speech.  Cardiovascular:  Regular rate and rhythm, without  murmurs or carotid bruit, and without distended neck veins. Respiratory: Lungs are clear to auscultation. Skin:  Without evidence of edema, or rash Trunk: BMI is elevated. This patient has normal posture.  Neurologic exam : The patient is awake and alert, oriented to place and time.  Memory subjective described as intact.  There is a normal attention span & concentration ability.  Speech is non- fluent, she struggles today with fluency,  is mid-sentence lost for words.  Mood and affect are outgoing, but volatile.  She feels easily in tears. She is easily choked up.  Cranial nerves: Pupils are reactive to light. The patient just underwent a left-sided cataract ectomy. She does have a slightly distal rounded pupil on the left this is also a corrective lens which allows her to not wear glasses. Funduscopic exam without evidence of pallor or edema.  Extraocular movements  in vertical and horizontal planes intact and without nystagmus.  Visual fields by finger perimetry are intact. Hearing corrected by hearing aids.   Facial sensation intact to fine touch.  Facial motor strength is symmetric and tongue and uvula move midline.mallompatin2 , neck circumference 15  Motor exam:  Normal tone, muscle bulk and symmetric normal strength in all extremities. Sensory:  Fine touch, pinprick and vibration were normal.Coordination: . Finger-to-nose maneuver tested and with a mild  tremor.  Gait and station: Patient walks without assistive device . Strength within normal limits.  Deep tendon reflexes: in the  upper and lower extremities are symmetric and intact. Babinski maneuver response is  downgoing.   Assessment:  After physical and neurologic examination, review of laboratory studies, imaging, neurophysiology testing and pre-existing records, assessment is   1) OSA with UARS.  This condition is well controlled on the current CPAP setting of 5 cm.  100% compliance.  Humidifier can be self adjusted.   2) continued weight gain , low carb diet and exercise regimen discussed.   3) the speech fluency is s disturbed, i would like for her PCP to look at this , too. I will perform a MOCA with the revisit.    Plan:  Treatment plan and additional workup : Continue CPAP , RV in 12 month with machine , MOCA please, see NP Ward Givens  .

## 2015-03-13 ENCOUNTER — Ambulatory Visit
Admission: RE | Admit: 2015-03-13 | Discharge: 2015-03-13 | Disposition: A | Discharge status: Home / Self Care | Payer: Commercial Managed Care - HMO | Source: Ambulatory Visit

## 2015-03-13 ENCOUNTER — Ambulatory Visit: Payer: Medicare PPO

## 2015-03-13 DIAGNOSIS — Z1231 Encounter for screening mammogram for malignant neoplasm of breast: Secondary | ICD-10-CM

## 2015-03-28 DIAGNOSIS — H4011X1 Primary open-angle glaucoma, mild stage: Secondary | ICD-10-CM | POA: Diagnosis not present

## 2015-04-25 DIAGNOSIS — G4733 Obstructive sleep apnea (adult) (pediatric): Secondary | ICD-10-CM | POA: Diagnosis not present

## 2015-08-08 ENCOUNTER — Other Ambulatory Visit: Payer: Self-pay | Admitting: Internal Medicine

## 2015-08-09 ENCOUNTER — Telehealth: Payer: Self-pay | Admitting: *Deleted

## 2015-08-09 NOTE — Telephone Encounter (Signed)
Called patient's pharmacy spoke to [facility] with prescription instructions on diazepam 5 mg. Also, patient was advised it was called to pharmacy.

## 2015-08-09 NOTE — Telephone Encounter (Signed)
Pt has appt on 11/16

## 2015-09-18 ENCOUNTER — Encounter: Payer: Self-pay | Admitting: Internal Medicine

## 2015-09-18 ENCOUNTER — Ambulatory Visit (INDEPENDENT_AMBULATORY_CARE_PROVIDER_SITE_OTHER): Payer: Commercial Managed Care - HMO | Admitting: Internal Medicine

## 2015-09-18 VITALS — BP 155/77 | HR 77 | Temp 98.0°F | Resp 16 | Ht 62.5 in | Wt 164.0 lb

## 2015-09-18 DIAGNOSIS — Z0000 Encounter for general adult medical examination without abnormal findings: Secondary | ICD-10-CM | POA: Diagnosis not present

## 2015-09-18 DIAGNOSIS — G4733 Obstructive sleep apnea (adult) (pediatric): Secondary | ICD-10-CM | POA: Diagnosis not present

## 2015-09-18 DIAGNOSIS — L853 Xerosis cutis: Secondary | ICD-10-CM

## 2015-09-18 DIAGNOSIS — E669 Obesity, unspecified: Secondary | ICD-10-CM | POA: Diagnosis not present

## 2015-09-18 DIAGNOSIS — F419 Anxiety disorder, unspecified: Secondary | ICD-10-CM | POA: Diagnosis not present

## 2015-09-18 DIAGNOSIS — L659 Nonscarring hair loss, unspecified: Secondary | ICD-10-CM

## 2015-09-18 DIAGNOSIS — L719 Rosacea, unspecified: Secondary | ICD-10-CM | POA: Diagnosis not present

## 2015-09-18 DIAGNOSIS — Z6828 Body mass index (BMI) 28.0-28.9, adult: Secondary | ICD-10-CM

## 2015-09-18 DIAGNOSIS — H409 Unspecified glaucoma: Secondary | ICD-10-CM | POA: Diagnosis not present

## 2015-09-18 DIAGNOSIS — Z9842 Cataract extraction status, left eye: Secondary | ICD-10-CM

## 2015-09-18 DIAGNOSIS — E781 Pure hyperglyceridemia: Secondary | ICD-10-CM | POA: Diagnosis not present

## 2015-09-18 DIAGNOSIS — Z9849 Cataract extraction status, unspecified eye: Secondary | ICD-10-CM | POA: Insufficient documentation

## 2015-09-18 LAB — COMPREHENSIVE METABOLIC PANEL
ALBUMIN: 4.3 g/dL (ref 3.6–5.1)
ALK PHOS: 65 U/L (ref 33–130)
ALT: 21 U/L (ref 6–29)
AST: 22 U/L (ref 10–35)
BILIRUBIN TOTAL: 0.7 mg/dL (ref 0.2–1.2)
BUN: 17 mg/dL (ref 7–25)
CO2: 28 mmol/L (ref 20–31)
CREATININE: 0.85 mg/dL (ref 0.60–0.93)
Calcium: 9.6 mg/dL (ref 8.6–10.4)
Chloride: 101 mmol/L (ref 98–110)
Glucose, Bld: 85 mg/dL (ref 65–99)
Potassium: 4.9 mmol/L (ref 3.5–5.3)
SODIUM: 139 mmol/L (ref 135–146)
TOTAL PROTEIN: 7.1 g/dL (ref 6.1–8.1)

## 2015-09-18 LAB — CBC WITH DIFFERENTIAL/PLATELET
BASOS ABS: 0.1 10*3/uL (ref 0.0–0.1)
BASOS PCT: 1 % (ref 0–1)
Eosinophils Absolute: 0.1 10*3/uL (ref 0.0–0.7)
Eosinophils Relative: 1 % (ref 0–5)
HCT: 43.8 % (ref 36.0–46.0)
HEMOGLOBIN: 14.7 g/dL (ref 12.0–15.0)
Lymphocytes Relative: 35 % (ref 12–46)
Lymphs Abs: 2.1 10*3/uL (ref 0.7–4.0)
MCH: 30.7 pg (ref 26.0–34.0)
MCHC: 33.6 g/dL (ref 30.0–36.0)
MCV: 91.4 fL (ref 78.0–100.0)
MONOS PCT: 7 % (ref 3–12)
MPV: 9.2 fL (ref 8.6–12.4)
Monocytes Absolute: 0.4 10*3/uL (ref 0.1–1.0)
NEUTROS ABS: 3.3 10*3/uL (ref 1.7–7.7)
NEUTROS PCT: 56 % (ref 43–77)
PLATELETS: 278 10*3/uL (ref 150–400)
RBC: 4.79 MIL/uL (ref 3.87–5.11)
RDW: 13.8 % (ref 11.5–15.5)
WBC: 5.9 10*3/uL (ref 4.0–10.5)

## 2015-09-18 LAB — LIPID PANEL
CHOLESTEROL: 199 mg/dL (ref 125–200)
HDL: 56 mg/dL (ref >=46)
LDL Cholesterol: 100 mg/dL (ref <130)
TRIGLYCERIDES: 215 mg/dL — AB (ref <150)
Total CHOL/HDL Ratio: 3.6 Ratio (ref <=5.0)
VLDL: 43 mg/dL — ABNORMAL HIGH (ref <30)

## 2015-09-18 LAB — TSH: TSH: 1.419 u[IU]/mL (ref 0.350–4.500)

## 2015-09-18 MED ORDER — ZOSTER VACCINE LIVE 19400 UNT/0.65ML ~~LOC~~ SOLR: 0.6500 mL | Freq: Once | SUBCUTANEOUS | Status: DC

## 2015-09-21 NOTE — Progress Notes (Addendum)
Subjective:    Patient ID: Nicole Holt, female    DOB: [birth year], 74 y.o.   MRN: [id number]  HPIannual Patient Active Problem List   Diagnosis Date Noted  . OSA on CPAP---doing very well!!!!! 02/02/2014    Priority: Medium  . Rosacea----flushes when anxious 09/18/2015  . S/P cataract surgery 09/18/2015  . Obesity---incr activity 03/12/2015  . Tremor, essential 02/02/2014  . UARS (upper airway resistance syndrome) 02/02/2014  . Osteoporosis 23-Aug-2012  . Glaucoma Aug 23, 2012  . Restless leg syndrome Aug 23, 2012  . Anxiety---see past OVs--part social and part unrequieted relat with Mom, a tyrant for all, now deceased 2 yrs Aug 23, 2012  . Tinnitus 07/27/2012  . Hearing loss 07/27/2012  . Basal cell cancer 07/27/2012   Doing well in general. Lots of activity with grandkids and her godson. [facility] state in philosophy and helping her with her journal which may be published at some point.   mammo 5/16 wnl Time to do dexa again--has had findings that improved at f/u over the yrs Colo 2012  Review of Systems 14pt neg other than PI    Objective:   Physical Exam  Constitutional: She is oriented to person, place, and time. She appears well-developed and well-nourished. No distress.  HENT:  Head: Normocephalic.  Right Ear: External ear normal.  Left Ear: External ear normal.  Nose: Nose normal.  Mouth/Throat: Oropharynx is clear and moist.  Eyes: Conjunctivae and EOM are normal. Pupils are equal, round, and reactive to light.  Neck: Normal range of motion. Neck supple. No thyromegaly present.  Cardiovascular: Normal rate, regular rhythm, normal heart sounds and intact distal pulses.   No murmur heard. Pulmonary/Chest: Effort normal and breath sounds normal. She has no wheezes.  Abdominal: Soft. Bowel sounds are normal. She exhibits no distension and no mass. There is no tenderness. There is no rebound.  Musculoskeletal: Normal range of motion. She exhibits no edema or  tenderness.  Lymphadenopathy:    She has no cervical adenopathy.  Neurological: She is alert and oriented to person, place, and time. She has normal reflexes. No cranial nerve deficit.  Skin: Skin is warm and dry. No rash noted.  Psychiatric: She has a normal mood and affect. Her behavior is normal. Judgment and thought content normal.  Nursing note and vitals reviewed. BP 155/77 mmHg  Pulse 77  Temp(Src) 98 F (36.7 C)  Resp 16  Ht 5' 2.5" (1.588 m)  Wt 164 lb (74.39 kg)  BMI 29.50 kg/m2 BP Readings from Last 3 Encounters:  09/18/15 155/77  03/12/15 127/71  09/12/14 130/76           Assessment & Plan:  Annual physical exam - Plan: CBC with Differential/Platelet, Comprehensive metabolic panel, Lipid panel, TSH  OSA on CPAP-stable  Obesity BMI 28.0-28.9,adult Wt Readings from Last 3 Encounters:  09/18/15 164 lb (74.39 kg)  03/12/15 169 lb (76.658 kg)  09/12/14 164 lb (74.39 kg)   discussed diet and exercise  Anxiety--improving  Rosacea--stable  Dry skin Hair loss  Glaucoma  S/P cataract surgery, left--2015  BP sl incr w/out dx HTN--?anx as usual---home BPs to be done and sent in  Meds ordered this encounter  Medications  . zoster vaccine live, PF, (ZOSTAVAX) 76283 UNT/0.65ML injection    Sig: Inject 19,400 Units into the skin once. Administer at pharmacy    Dispense:  1 each    Refill:  0   prevnar    Addendum labs 09/21/2015 Results for orders placed or performed in visit  on 09/18/15  CBC with Differential/Platelet  Result Value Ref Range   WBC 5.9 4.0 - 10.5 K/uL   RBC 4.79 3.87 - 5.11 MIL/uL   Hemoglobin 14.7 12.0 - 15.0 g/dL   HCT 43.8 36.0 - 46.0 %   MCV 91.4 78.0 - 100.0 fL   MCH 30.7 26.0 - 34.0 pg   MCHC 33.6 30.0 - 36.0 g/dL   RDW 13.8 11.5 - 15.5 %   Platelets 278 150 - 400 K/uL   MPV 9.2 8.6 - 12.4 fL   Neutrophils Relative % 56 43 - 77 %   Neutro Abs 3.3 1.7 - 7.7 K/uL   Lymphocytes Relative 35 12 - 46 %   Lymphs Abs 2.1 0.7 -  4.0 K/uL   Monocytes Relative 7 3 - 12 %   Monocytes Absolute 0.4 0.1 - 1.0 K/uL   Eosinophils Relative 1 0 - 5 %   Eosinophils Absolute 0.1 0.0 - 0.7 K/uL   Basophils Relative 1 0 - 1 %   Basophils Absolute 0.1 0.0 - 0.1 K/uL   Smear Review Criteria for review not met   Comprehensive metabolic panel  Result Value Ref Range   Sodium 139 135 - 146 mmol/L   Potassium 4.9 3.5 - 5.3 mmol/L   Chloride 101 98 - 110 mmol/L   CO2 28 20 - 31 mmol/L   Glucose, Bld 85 65 - 99 mg/dL   BUN 17 7 - 25 mg/dL   Creat 0.85 0.60 - 0.93 mg/dL   Total Bilirubin 0.7 0.2 - 1.2 mg/dL   Alkaline Phosphatase 65 33 - 130 U/L   AST 22 10 - 35 U/L   ALT 21 6 - 29 U/L   Total Protein 7.1 6.1 - 8.1 g/dL   Albumin 4.3 3.6 - 5.1 g/dL   Calcium 9.6 8.6 - 10.4 mg/dL  Lipid panel  Result Value Ref Range   Cholesterol 199 125 - 200 mg/dL   Triglycerides 215 (H) <150 mg/dL   HDL 56 >=46 mg/dL   Total CHOL/HDL Ratio 3.6 <=5.0 Ratio   VLDL 43 (H) <30 mg/dL   LDL Cholesterol 100 <130 mg/dL  TSH  Result Value Ref Range   TSH 1.419 0.350 - 4.500 uIU/mL   Fu 87yr

## 2015-09-30 ENCOUNTER — Encounter: Payer: Self-pay | Admitting: Internal Medicine

## 2015-10-01 DIAGNOSIS — H401131 Primary open-angle glaucoma, bilateral, mild stage: Secondary | ICD-10-CM | POA: Diagnosis not present

## 2015-10-23 DIAGNOSIS — L821 Other seborrheic keratosis: Secondary | ICD-10-CM | POA: Diagnosis not present

## 2015-10-23 DIAGNOSIS — L812 Freckles: Secondary | ICD-10-CM | POA: Diagnosis not present

## 2015-10-23 DIAGNOSIS — Z85828 Personal history of other malignant neoplasm of skin: Secondary | ICD-10-CM | POA: Diagnosis not present

## 2015-11-15 ENCOUNTER — Other Ambulatory Visit: Payer: Self-pay | Admitting: Internal Medicine

## 2015-11-15 ENCOUNTER — Telehealth: Payer: Self-pay

## 2015-11-15 NOTE — Telephone Encounter (Signed)
Called in.

## 2015-11-15 NOTE — Telephone Encounter (Signed)
See letter 11/19 Copy at tl Reassure her all ok

## 2015-11-15 NOTE — Telephone Encounter (Signed)
Ok to call in

## 2015-11-15 NOTE — Telephone Encounter (Signed)
Dr. Laney Pastor, pt never received labs from Nov. Please review. Thanks

## 2015-11-18 NOTE — Telephone Encounter (Signed)
Called pt and advised message from provider on their voicemail.  

## 2016-01-17 DIAGNOSIS — G4733 Obstructive sleep apnea (adult) (pediatric): Secondary | ICD-10-CM | POA: Diagnosis not present

## 2016-01-29 DIAGNOSIS — H401131 Primary open-angle glaucoma, bilateral, mild stage: Secondary | ICD-10-CM | POA: Diagnosis not present

## 2016-01-29 DIAGNOSIS — H2511 Age-related nuclear cataract, right eye: Secondary | ICD-10-CM | POA: Diagnosis not present

## 2016-03-06 ENCOUNTER — Telehealth: Payer: Self-pay | Admitting: *Deleted

## 2016-03-06 NOTE — Telephone Encounter (Signed)
I called and LMVM home that Dr. Brett Fairy ok'd for pt to see NP (CM).  Please return call.

## 2016-03-09 NOTE — Telephone Encounter (Signed)
I spoke to pt and she is ok to see NP.   Made appt for 03-10-16 at 1100.

## 2016-03-10 ENCOUNTER — Ambulatory Visit (INDEPENDENT_AMBULATORY_CARE_PROVIDER_SITE_OTHER): Payer: Commercial Managed Care - HMO | Admitting: Nurse Practitioner

## 2016-03-10 ENCOUNTER — Encounter: Payer: Self-pay | Admitting: Nurse Practitioner

## 2016-03-10 VITALS — BP 129/76 | HR 63 | Ht 62.5 in | Wt 170.0 lb

## 2016-03-10 DIAGNOSIS — G4733 Obstructive sleep apnea (adult) (pediatric): Secondary | ICD-10-CM | POA: Diagnosis not present

## 2016-03-10 DIAGNOSIS — E669 Obesity, unspecified: Secondary | ICD-10-CM

## 2016-03-10 DIAGNOSIS — G250 Essential tremor: Secondary | ICD-10-CM

## 2016-03-10 DIAGNOSIS — Z9989 Dependence on other enabling machines and devices: Principal | ICD-10-CM

## 2016-03-10 NOTE — Progress Notes (Signed)
I agree with the assessment and plan as directed by NP .The patient is known to me .   Shaye Lagace, MD  

## 2016-03-10 NOTE — Progress Notes (Signed)
[facility]  PATIENT: Nicole Holt DOB: Mar 11, 1941   REASON FOR VISIT: Follow-up for obstructive sleep apnea on CPAP, obesity, nonfluent speech at last visit HISTORY FROM: Patient    HISTORY OF PRESENT ILLNESS:HISTORT CDMrs. Holt originally presented upon referral of Dr. Laney Pastor in 2013 . She is a right handed , chronically hoarse ,caucasian female patient , who had reportedly loudly snoring and often woke herself from her snoring. She also woke up with a dry mouth and felt not rested or restored in the mornings. She gained weight , became less active. Or roll prior to her going sleep evaluation she endorsed getting 6-7 hours of nocturnal sleep. She also has suffered from allergic rhinitis and used an Counsellor. Her grandchildren had reportedly been bothered by her loud snoring , which made her seek medical advice. She endorsed the Epworth score at 14 of 21 points at the time ( 2013) . The patient's sleep study in November 2013 documented AHI of 9.2 and RDI of 18, not SPLIT - but she was hypoxic for over one hour at night.  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  She returned for a CPAP auto- titration and seemed to respond best to only 5 cm water pressure. 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 As she started on CPAP later that year , her rhinitis improved and she had no further sinusitis exacerbations.  She goes to bed at 11 Pm and falls asleep within 5 minutes, has a bathroom break at 2.30 AM and rarely another at 4.30. Unless she has a lot on her mind, she will easily fall asleep again.  Total sleep time is now 7-8 hours nightly, no daytime naps.  Her Epworth sleepiness score had been reduced after she initiated CPAP he  was and is currently at 6 points her fatigue severity score it on the 18 points , both greatly reduced. . Her geriatric depression score was at 3 points, not indicative of depression. Unable today to review the patient's compliance record she has 100% compliance on 180 date download, dated 02-02-2014. The nightly use of 7 hours 55 minutes with CPAP her residual AHI is 0.9 at that but CPAP pressure of 5 cm water. She loves her CPAP ! No neck , face or ENT surgery history .   Interval history since the patient underwent a CPAP titration on 09-27-12 as been followed yearly in our office.  Today is 03-12-15,CD the patient is currently under [facility] coverage and uses the only available DME, preop. She has a CPAP machine by [employer] system 1 with heated humidifier, coiled air tubing and uses a S-M size nasal mask. She has still other size liners, but only this one fits.  She needs to reorder this size through upper area. Her compliance information is available today downloaded in office on with a date 03-11-15 she's 100% compliant forward 30 out of 30 days of use 100% for over 4 hours of daily use with an average user time of 8 hours and 17 minutes. Her average AHI is 0.8 and a CPAP pressure of only 5 cm water.  her spepech is non fluent at the beginning of today's visit, but improved. She looks for words.   UPDATE  05/19/2017CM Nicole Holt, 75 year old returns for yearly follow-up for CPAP compliance. In addition she had some nonfluent speech at her last visit and Dr. Brett Fairy has requested [facility]  testing as well. Her CPAP compliance is  100% with average usage 8 hours 13 minutes. Her average AHI is 0.8. Pressure is at 5 cm. No leaks ESS score 4, fatigue severity scale 18. MOCA -25 out of 30. A tremor is noted to voice. Her sister has Parkinson's disease. She returns for reevaluation. Previous records reviewed REVIEW OF SYSTEMS: Full 14 system review of systems performed and notable  only for those listed, all others are neg:  Constitutional: neg  Cardiovascular: neg Ear/Nose/Throat: neg  Skin: neg Eyes: neg Respiratory: neg Gastroitestinal: neg  Hematology/Lymphatic: neg  Endocrine: neg Musculoskeletal:neg Allergy/Immunology: neg Neurological: neg Psychiatric: Anxiety depression Sleep : Obstructive sleep apnea on CPAP   ALLERGIES: Allergies  Allergen Reactions  . Codeine Nausea Only  . Estrogens Conjugated Other (See Comments)    HA  . Paroxetine Hcl Other (See Comments)    Felt like she was "out of her head"    HOME MEDICATIONS: Outpatient Prescriptions Prior to Visit  Medication Sig Dispense Refill  . diazepam (VALIUM) 5 MG tablet TAKE ONE TABLET BY MOUTH EVERY 6 HOURS AS NEEDED FOR ANXIETY 30 tablet 5  . Travoprost, BAK Free, (TRAVATAN) 0.004 % SOLN ophthalmic solution Place 1 drop into both eyes at bedtime.    . zoster vaccine live, PF, (ZOSTAVAX) 16109 UNT/0.65ML injection Inject 19,400 Units into the skin once. Administer at pharmacy 1 each 0   No facility-administered medications prior to visit.    PAST MEDICAL HISTORY: Past Medical History  Diagnosis Date  . Anxiety     panic attack  . Osteoporosis   . Depression   . Glaucoma   . Sleep apnea   . Hearing loss   . History of basal cell cancer 08/19/2012    Removed from nose  . Glaucoma   . Tinnitus   . Migraines     ocular  . OSA on CPAP 02/02/2014    PSG 09-27-2012 RDI 18.9 and AHi 9.8, Epworth 15 .titrated to only 5 cm water.     PAST SURGICAL HISTORY: Past Surgical History  Procedure Laterality Date  . Cosmetic surgery    . Cataract surgery      FAMILY HISTORY: Family History  Problem Relation Age of Onset  . Osteoporosis Mother   . Stroke Mother   . Hyperlipidemia Mother   . Kidney disease Father   . Congestive Heart Failure Father   . Heart disease Father   . Hyperlipidemia Father   . Stroke Sister   . Parkinson's disease Sister   . Arthritis/Rheumatoid    .  Parkinson's disease    . Polymyalgia rheumatica Sister   . Hypertension Brother     SOCIAL HISTORY: Social History   Social History  . Marital Status: Married    Spouse Name: Legrand Como  . Number of Children: 2  . Years of Education: 16   Occupational History  . Retired    Social History Main Topics  . Smoking status: Former Research scientist (life sciences)  . Smokeless tobacco: Never Used     Comment: Quit in 1987  . Alcohol Use: 7.2 oz/week    12 Glasses of wine per week     Comment: wine or alcohol daily  . Drug Use: No  . Sexual Activity: Yes   Other Topics Concern  . Not on file   Social  History Narrative   Patient is married Legrand Como) and lives with her husband.   Patient is retired.   Patient has two children.   Patient is right-handed.   Patient has a college education.   Patient drinks two cups of coffee daily.              PHYSICAL EXAM  Filed Vitals:   03/10/16 1059  BP: 129/76  Pulse: 63  Height: 5' 2.5" (1.588 m)  Weight: 170 lb (77.111 kg)   Body mass index is 30.58 kg/(m^2).  Generalized: Well developed, in no acute distress ,Well-groomed Head: normocephalic and atraumatic,. Oropharynx benign  Neck: Supple, no carotid bruits , neck circumference 15.5 Cardiac: Regular rate rhythm, no murmur  Musculoskeletal: No deformity   Neurological examination   Mentation: Alert oriented to time, place, history taking. Attention span and concentration appropriate. Recent and remote memory intact.  Follows all commands speech and language fluent. MOCA 25/30. ESS 4 fatigue severity score 18  Cranial nerve II-XII: Fundoscopic exam reveals sharp disc margins.Pupils were equal round reactive to light extraocular movements were full, visual field were full on confrontational test. Facial sensation and strength were normal. hearing was intact to finger rubbing bilaterally. Uvula tongue midline. head turning and shoulder shrug were normal and symmetric.Tongue protrusion into cheek  strength was normal. Vocal tremor Motor: normal bulk and tone, full strength in the BUE, BLE, fine finger movements normal, no pronator drift. No focal weakness Sensory: normal and symmetric to light touch, pinprick, and  Vibration, proprioception  Coordination: finger-nose-finger, heel-to-shin bilaterally, no dysmetria Reflexes: Brachioradialis 2/2, biceps 2/2, triceps 2/2, patellar 2/2, Achilles 2/2, plantar responses were flexor bilaterally. Gait and Station: Rising up from seated position without assistance, normal stance,  moderate stride, good arm swing, smooth turning, able to perform tiptoe, and heel walking without difficulty. Tandem gait is steady  DIAGNOSTIC DATA (LABS, IMAGING, TESTING) - I reviewed patient records, labs, notes, testing and imaging myself where available.  Lab Results  Component Value Date   WBC 5.9 09/18/2015   HGB 14.7 09/18/2015   HCT 43.8 09/18/2015   MCV 91.4 09/18/2015   PLT 278 09/18/2015      Component Value Date/Time   NA 139 09/18/2015 1258   K 4.9 09/18/2015 1258   CL 101 09/18/2015 1258   CO2 28 09/18/2015 1258   GLUCOSE 85 09/18/2015 1258   BUN 17 09/18/2015 1258   CREATININE 0.85 09/18/2015 1258   CALCIUM 9.6 09/18/2015 1258   PROT 7.1 09/18/2015 1258   ALBUMIN 4.3 09/18/2015 1258   AST 22 09/18/2015 1258   ALT 21 09/18/2015 1258   ALKPHOS 65 09/18/2015 1258   BILITOT 0.7 09/18/2015 1258   GFRNONAA 67 09/12/2014 1730   GFRAA 77 09/12/2014 1730   Lab Results  Component Value Date   CHOL 199 09/18/2015   HDL 56 09/18/2015   LDLCALC 100 09/18/2015   TRIG 215* 09/18/2015   CHOLHDL 3.6 09/18/2015    Lab Results  Component Value Date   TSH 1.419 09/18/2015      ASSESSMENT AND PLAN  75 y.o. year old female  has a past medical history of Anxiety;  Depression;  Sleep apnea;  OSA on CPAP (02/02/2014). And UARS here to follow up. Mild word finding problems.   PLAN CPAP compliance is  100% with average usage 8 hours 13 minutes.  Her average AHI is 0.8. Pressure is at 5 cm. No leaks ESS score 4, fatigue severity scale 18. MOCA -25 out  of 30.  PLAN: CPAP compliance is excellent 100% compliance for 30 days Will follow memory score over time Follow-up yearly and when necessary Vst time 25 min Dennie Bible, [facility], [facility], [facility] [address] [place], [place] [phone]

## 2016-03-10 NOTE — Patient Instructions (Signed)
CPAP compliance is excellent 100% compliance for 30 days Will follow memory score over time Follow-up yearly and when necessary  

## 2016-03-11 ENCOUNTER — Ambulatory Visit: Payer: Commercial Managed Care - HMO | Admitting: Neurology

## 2016-05-11 ENCOUNTER — Other Ambulatory Visit: Payer: Self-pay | Admitting: Emergency Medicine

## 2016-05-11 ENCOUNTER — Telehealth: Payer: Self-pay

## 2016-05-11 ENCOUNTER — Other Ambulatory Visit: Payer: Self-pay | Admitting: Family Medicine

## 2016-05-11 DIAGNOSIS — Z1231 Encounter for screening mammogram for malignant neoplasm of breast: Secondary | ICD-10-CM

## 2016-05-11 NOTE — Telephone Encounter (Signed)
Patient would like her lab results from November 2016 mailed to her.  Is it okay to send.  Doesn't look like Dr. Laney Pastor commented on them.

## 2016-05-11 NOTE — Telephone Encounter (Signed)
Yes, he usually sends a letter with his comments.

## 2016-05-12 ENCOUNTER — Ambulatory Visit
Admission: RE | Admit: 2016-05-12 | Discharge: 2016-05-12 | Disposition: A | Discharge status: Home / Self Care | Payer: Commercial Managed Care - HMO | Source: Ambulatory Visit | Attending: Family Medicine | Admitting: Family Medicine

## 2016-05-12 DIAGNOSIS — Z1231 Encounter for screening mammogram for malignant neoplasm of breast: Secondary | ICD-10-CM | POA: Diagnosis not present

## 2016-05-14 ENCOUNTER — Other Ambulatory Visit: Payer: Self-pay | Admitting: Family Medicine

## 2016-05-14 DIAGNOSIS — R928 Other abnormal and inconclusive findings on diagnostic imaging of breast: Secondary | ICD-10-CM

## 2016-05-20 ENCOUNTER — Ambulatory Visit
Admission: RE | Admit: 2016-05-20 | Discharge: 2016-05-20 | Disposition: A | Discharge status: Home / Self Care | Payer: Commercial Managed Care - HMO | Source: Ambulatory Visit | Attending: Family Medicine | Admitting: Family Medicine

## 2016-05-20 DIAGNOSIS — R928 Other abnormal and inconclusive findings on diagnostic imaging of breast: Secondary | ICD-10-CM

## 2016-05-20 DIAGNOSIS — N6489 Other specified disorders of breast: Secondary | ICD-10-CM | POA: Diagnosis not present

## 2016-06-26 ENCOUNTER — Other Ambulatory Visit: Payer: Self-pay

## 2016-06-26 MED ORDER — DIAZEPAM 5 MG PO TABS: 5.0000 mg | ORAL_TABLET | Freq: Four times a day (QID) | ORAL | 0 refills | Status: DC | PRN

## 2016-06-26 NOTE — Telephone Encounter (Signed)
[place] pt. Last OV 09/1615. Last RFs 11/15/15 for 6 mos of RFs. pended

## 2016-06-26 NOTE — Telephone Encounter (Signed)
Meds ordered this encounter  Medications  . diazepam (VALIUM) 5 MG tablet    Sig: Take 1 tablet (5 mg total) by mouth every 6 (six) hours as needed. for anxiety    Dispense:  30 tablet    Refill:  0    Please advise patient that she needs to establish with a new PCP.

## 2016-06-27 NOTE — Telephone Encounter (Signed)
rx faxed

## 2016-08-04 DIAGNOSIS — H401131 Primary open-angle glaucoma, bilateral, mild stage: Secondary | ICD-10-CM | POA: Diagnosis not present

## 2016-08-04 DIAGNOSIS — H2511 Age-related nuclear cataract, right eye: Secondary | ICD-10-CM | POA: Diagnosis not present

## 2016-09-22 ENCOUNTER — Ambulatory Visit (INDEPENDENT_AMBULATORY_CARE_PROVIDER_SITE_OTHER): Payer: Commercial Managed Care - HMO | Admitting: Physician Assistant

## 2016-09-22 ENCOUNTER — Encounter: Payer: Self-pay | Admitting: Physician Assistant

## 2016-09-22 VITALS — BP 130/74 | HR 62 | Temp 97.6°F | Resp 16 | Ht 62.5 in | Wt 168.2 lb

## 2016-09-22 DIAGNOSIS — E2839 Other primary ovarian failure: Secondary | ICD-10-CM

## 2016-09-22 DIAGNOSIS — R928 Other abnormal and inconclusive findings on diagnostic imaging of breast: Secondary | ICD-10-CM | POA: Diagnosis not present

## 2016-09-22 DIAGNOSIS — E781 Pure hyperglyceridemia: Secondary | ICD-10-CM | POA: Diagnosis not present

## 2016-09-22 DIAGNOSIS — F419 Anxiety disorder, unspecified: Secondary | ICD-10-CM | POA: Diagnosis not present

## 2016-09-22 DIAGNOSIS — Z0000 Encounter for general adult medical examination without abnormal findings: Secondary | ICD-10-CM

## 2016-09-22 DIAGNOSIS — Z23 Encounter for immunization: Secondary | ICD-10-CM | POA: Diagnosis not present

## 2016-09-22 DIAGNOSIS — M810 Age-related osteoporosis without current pathological fracture: Secondary | ICD-10-CM

## 2016-09-22 LAB — LIPID PANEL
Cholesterol: 215 mg/dL — ABNORMAL HIGH (ref <200)
HDL: 53 mg/dL (ref >50)
LDL CALC: 114 mg/dL — AB (ref <100)
Total CHOL/HDL Ratio: 4.1 Ratio (ref <5.0)
Triglycerides: 240 mg/dL — ABNORMAL HIGH (ref <150)
VLDL: 48 mg/dL — AB (ref <30)

## 2016-09-22 LAB — CBC WITH DIFFERENTIAL/PLATELET
Basophils Absolute: 53 cells/uL (ref 0–200)
Basophils Relative: 1 %
Eosinophils Absolute: 53 cells/uL (ref 15–500)
Eosinophils Relative: 1 %
HEMATOCRIT: 43.4 % (ref 35.0–45.0)
HEMOGLOBIN: 14.5 g/dL (ref 11.7–15.5)
LYMPHS ABS: 2014 {cells}/uL (ref 850–3900)
Lymphocytes Relative: 38 %
MCH: 30.7 pg (ref 27.0–33.0)
MCHC: 33.4 g/dL (ref 32.0–36.0)
MCV: 91.8 fL (ref 80.0–100.0)
MPV: 9.4 fL (ref 7.5–12.5)
Monocytes Absolute: 424 cells/uL (ref 200–950)
Monocytes Relative: 8 %
NEUTROS ABS: 2756 {cells}/uL (ref 1500–7800)
Neutrophils Relative %: 52 %
Platelets: 267 10*3/uL (ref 140–400)
RBC: 4.73 MIL/uL (ref 3.80–5.10)
RDW: 14 % (ref 11.0–15.0)
WBC: 5.3 10*3/uL (ref 3.8–10.8)

## 2016-09-22 LAB — COMPREHENSIVE METABOLIC PANEL
ALBUMIN: 4.3 g/dL (ref 3.6–5.1)
ALK PHOS: 59 U/L (ref 33–130)
ALT: 24 U/L (ref 6–29)
AST: 23 U/L (ref 10–35)
BILIRUBIN TOTAL: 0.6 mg/dL (ref 0.2–1.2)
BUN: 15 mg/dL (ref 7–25)
CALCIUM: 9.1 mg/dL (ref 8.6–10.4)
CO2: 27 mmol/L (ref 20–31)
Chloride: 103 mmol/L (ref 98–110)
Creat: 0.98 mg/dL — ABNORMAL HIGH (ref 0.60–0.93)
Glucose, Bld: 89 mg/dL (ref 65–99)
POTASSIUM: 4.3 mmol/L (ref 3.5–5.3)
Sodium: 138 mmol/L (ref 135–146)
Total Protein: 7.1 g/dL (ref 6.1–8.1)

## 2016-09-22 LAB — TSH: TSH: 1.36 m[IU]/L

## 2016-09-22 MED ORDER — DIAZEPAM 5 MG PO TABS: 5.0000 mg | ORAL_TABLET | Freq: Four times a day (QID) | ORAL | 0 refills | Status: DC | PRN

## 2016-09-22 NOTE — Progress Notes (Deleted)
   Subjective:    Patient ID: Nicole Holt, female    DOB: Aug 22, 1941, 75 y.o.   MRN: [id number]  HPI    Review of Systems     Objective:   Physical Exam        Assessment & Plan:

## 2016-09-22 NOTE — Progress Notes (Signed)
Presents today for [employer] Visit-Subsequent.   Date of last exam: 09/2015 with Dr. Laney Pastor, who has since retired  Astronomer used for this visit? no  Patient Care Team: Dennie Bible, NP as Nurse Practitioner (Neurology) Marylynn Pearson, MD as Consulting Physician (Ophthalmology)   Other items to address today: none  Review of Systems  Constitutional: Negative.   HENT: Positive for hearing loss and tinnitus. Negative for congestion, ear discharge, ear pain, nosebleeds, sinus pain and sore throat.        Sneezing  Eyes: Positive for discharge. Negative for blurred vision, double vision, photophobia, pain and redness.  Respiratory: Negative.  Negative for stridor.   Cardiovascular: Negative.   Gastrointestinal: Negative.   Genitourinary: Negative.   Musculoskeletal: Negative.   Skin: Negative.   Neurological: Negative.   Endo/Heme/Allergies: Positive for environmental allergies. Negative for polydipsia. Does not bruise/bleed easily.  Psychiatric/Behavioral: Positive for depression. Negative for hallucinations, memory loss, substance abuse and suicidal ideas. The patient is nervous/anxious. The patient does not have insomnia.        Cancer Screening: Cervical: n/a Breast: yes, 03/2015 Colon: yes, 2012  Prostate: n/a   Other Screening: Last screening for diabetes: 09/2015, normal Last lipid screening: 09/2015, TG 215, remaining normal   ADVANCE DIRECTIVES: Discussed: yes On File: no Materials Provided: patient has these documents at home, will bring them in for her record.   Immunization status: missing doses of pneumococcal, declines influenza vaccine due to fear of Rosalee Kaufman (her husband had it).  Home Environment: lives with her husband.    Patient Active Problem List   Diagnosis Date Noted  . 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 Rosacea 09/18/2015  . S/P cataract surgery 09/18/2015  . Obesity 03/12/2015  . OSA on CPAP 02/02/2014   . Obesity, unspecified 02/02/2014  . Tremor, essential 02/02/2014  . UARS (upper airway resistance syndrome) 02/02/2014  . Osteoporosis 07/28/2012  . Glaucoma 07/28/2012  . Restless leg syndrome 07/28/2012  . Anxiety 07/28/2012  . Benign positional vertigo 07/28/2012  . BMI 28.0-28.9,adult 07/27/2012  . Tinnitus 07/27/2012  . Hearing loss 07/27/2012  . Basal cell cancer 07/27/2012     Past Medical History:  Diagnosis Date  . Allergy   . Anxiety    panic attack  . Cataract   . Depression   . Glaucoma   . Glaucoma   . Hearing loss   . 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 Migraines    ocular  . OSA on CPAP 02/02/2014   PSG 09-27-2012 RDI 18.9 and AHi 9.8, Epworth 15 .titrated to only 5 cm water.   . Osteoporosis   . Sleep apnea   . Tinnitus      Past Surgical History:  Procedure Laterality Date  . cataract surgery    . COSMETIC SURGERY       Family History  Problem Relation Age of Onset  . Osteoporosis Mother   . Stroke Mother   . Hyperlipidemia Mother   . Hypertension Mother   . Kidney disease Father   . Congestive Heart Failure Father   . Heart disease Father   . Hyperlipidemia Father   . Stroke Sister   . Parkinson's disease Sister   . Heart disease Maternal Grandmother   . Stroke Maternal Grandfather   . Stroke Paternal Grandmother   . Hypertension Brother   . Arthritis/Rheumatoid    . Parkinson's disease    . Heart disease    . Polymyalgia  rheumatica Sister      Social History   Social History  . Marital status: Married    Spouse name: Legrand Como  . Number of children: 2  . Years of education: 16   Occupational History  . Retired    Social History Main Topics  . Smoking status: Former Research scientist (life sciences)  . Smokeless tobacco: Never Used     Comment: Quit in 1987  . Alcohol use 7.2 oz/week    12 Glasses of wine per week     Comment: wine or alcohol daily  . Drug use: No  . Sexual activity: Yes   Other Topics Concern   . Not on file   Social History Narrative   Patient is married Legrand Como) and lives with her husband.   Patient is retired.   Patient has two children.   Patient is right-handed.   Patient has a college education.   Patient drinks two cups of coffee daily.              Allergies  Allergen Reactions  . Codeine Nausea Only  . Estrogens Conjugated Other (See Comments)    HA  . Paroxetine Hcl Other (See Comments)    Felt like she was "out of her head"     Prior to Admission medications   Medication Sig Start Date End Date Taking? Authorizing Provider  diazepam (VALIUM) 5 MG tablet Take 1 tablet (5 mg total) by mouth every 6 (six) hours as needed. for anxiety 06/26/16   Karn Derk, PA-C  Travoprost, BAK Free, (TRAVATAN) 0.004 % SOLN ophthalmic solution Place 1 drop into both eyes at bedtime.    Historical Provider, MD  zoster vaccine live, PF, (ZOSTAVAX) 91478 UNT/0.65ML injection Inject 19,400 Units into the skin once. Administer at pharmacy 09/18/15   Leandrew Koyanagi, MD     Depression screen [facility] 2/9 09/22/2016 09/18/2015 09/12/2014  Decreased Interest 1 1 0  Down, Depressed, Hopeless 0 3 0  PHQ - 2 Score 1 4 0  Altered sleeping - 1 -  Tired, decreased energy - 1 -  Change in appetite - 0 -  Feeling bad or failure about yourself  - 0 -  Trouble concentrating - 0 -  Moving slowly or fidgety/restless - 1 -  Suicidal thoughts - 0 -  PHQ-9 Score - 7 -     Fall Risk  09/22/2016 03/10/2016 09/18/2015 09/12/2014  Falls in the past year? No No No No     Functional Status Survey: Is the patient deaf or have difficulty hearing?: Yes Does the patient have difficulty seeing, even when wearing glasses/contacts?: No Does the patient have difficulty concentrating, remembering, or making decisions?: No Does the patient have difficulty walking or climbing stairs?: No Does the patient have difficulty dressing or bathing?: No Does the patient have difficulty doing errands  alone such as visiting a doctor's office or shopping?: No     Evaluation of Cognitive Function: Mood/affect: cheerful/bright Appearance: well-groomed Family Member/caregiver input: none    PHYSICAL EXAM: BP 130/74   Pulse 62   Temp 97.6 F (36.4 C) (Oral)   Resp 16   Ht 5' 2.5" (1.588 m)   Wt 168 lb 3.2 oz (76.3 kg)   SpO2 97%   BMI 30.27 kg/m    Wt Readings from Last 3 Encounters:  09/22/16 168 lb 3.2 oz (76.3 kg)  03/10/16 170 lb (77.1 kg)  09/18/15 164 lb (74.4 kg)       Visual Acuity Screening  Right eye Left eye Both eyes  Without correction: 20/30 20/70 20/30   With correction:         Physical Exam  Constitutional: She is oriented to person, place, and time. She appears well-developed and well-nourished. She is active and cooperative. No distress.  HENT:  Head: Normocephalic and atraumatic.  Right Ear: Hearing, tympanic membrane, external ear and ear canal normal.  Left Ear: Hearing, tympanic membrane, external ear and ear canal normal.  Nose: Nose normal.  Mouth/Throat: Uvula is midline, oropharynx is clear and moist and mucous membranes are normal. No oral lesions. No uvula swelling. No oropharyngeal exudate.  Eyes: Conjunctivae, EOM and lids are normal. Pupils are equal, round, and reactive to light. Right eye exhibits no discharge. Left eye exhibits no discharge. No scleral icterus.  Fundoscopic exam:      The right eye shows no hemorrhage and no papilledema. The right eye shows red reflex.       The left eye shows no hemorrhage and no papilledema. The left eye shows red reflex.  Neck: Normal range of motion, full passive range of motion without pain and phonation normal. Neck supple. No thyromegaly present.  Cardiovascular: Normal rate, regular rhythm, normal heart sounds and intact distal pulses.  Exam reveals no gallop and no friction rub.   No murmur heard. Respiratory: Effort normal and breath sounds normal. Right breast exhibits no inverted  nipple, no mass, no nipple discharge, no skin change and no tenderness. Left breast exhibits no inverted nipple, no mass, no nipple discharge, no skin change and no tenderness. Breasts are symmetrical.  GI: Soft. Normal appearance and bowel sounds are normal. There is no hepatosplenomegaly. There is no tenderness.  Musculoskeletal:       Cervical back: Normal.       Thoracic back: Normal.       Lumbar back: Normal.  Lymphadenopathy:       Head (right side): No submandibular and no tonsillar adenopathy present.       Head (left side): No submandibular and no tonsillar adenopathy present.    She has no cervical adenopathy.       Right: No supraclavicular adenopathy present.       Left: No supraclavicular adenopathy present.  Neurological: She is alert and oriented to person, place, and time. She has normal strength. No cranial nerve deficit or sensory deficit.  Skin: Skin is warm and dry. No rash noted. She is not diaphoretic.  Psychiatric: She has a normal mood and affect. Her speech is normal and behavior is normal. Judgment and thought content normal. Cognition and memory are normal.     Education/Counseling: yes diet and exercise yes prevention of chronic diseases yes smoking/tobacco cessation yes review "Covered Medicare Preventive Services"    ASSESSMENT/PLAN:  1. Medicare annual wellness visit, subsequent Age appropriate anticipatory guidance provided.  2. Need for pneumococcal vaccination - Pneumococcal conjugate vaccine 13-valent IM  3. Age-related osteoporosis without current pathological fracture - DG Bone Density; Future  4. Hypertriglyceridemia - Comprehensive metabolic panel - CBC with Differential/Platelet - Lipid panel  5. Anxiety - TSH - diazepam (VALIUM) 5 MG tablet; Take 1 tablet (5 mg total) by mouth every 6 (six) hours as needed. for anxiety  Dispense: 30 tablet; Refill: 0  6. Abnormal mammogram of right breast Follow-up 6 months as  planned.   Fara Chute, PA-C Physician Assistant-Certified [facility]

## 2016-09-22 NOTE — Progress Notes (Deleted)
   Subjective:    Patient ID: Nicole Holt, female    DOB: [birth year], 75 y.o.   MRN: [id number]  HPI    Review of Systems     Objective:   Physical Exam        Assessment & Plan:

## 2016-09-22 NOTE — Patient Instructions (Signed)
     IF you received an x-ray today, you will receive an invoice from [facility]. Please contact [facility] at [phone] with questions or concerns regarding your invoice.   IF you received labwork today, you will receive an invoice from [facility]. Please contact [facility] at [phone] with questions or concerns regarding your invoice.   Our billing staff will not be able to assist you with questions regarding bills from these companies.  You will be contacted with the lab results as soon as they are available. The fastest way to get your results is to activate your My Chart account. Instructions are located on the last page of this paperwork. If you have not heard from us regarding the results in 2 weeks, please contact this office.      

## 2016-09-22 NOTE — Progress Notes (Deleted)
   Subjective:    Patient ID: Nicole Holt, female    DOB: 1941/04/29, 75 y.o.   MRN: [id number]  HPI    Review of Systems     Objective:   Physical Exam        Assessment & Plan:

## 2016-09-23 ENCOUNTER — Telehealth: Payer: Self-pay

## 2016-09-23 NOTE — Telephone Encounter (Signed)
Order placed for bone density needs to be changed to "estrogen deficiency" as reasons/diag.

## 2016-09-24 ENCOUNTER — Encounter: Payer: Self-pay | Admitting: Physician Assistant

## 2016-09-28 NOTE — Telephone Encounter (Signed)
Added diagnosis of estrogen deficiency. I'm unsure why the diagnosis of otseoporosis doesn't work. Perhaps they can enlighten me?

## 2016-09-28 NOTE — Telephone Encounter (Signed)
Routed to [employer]

## 2016-11-03 ENCOUNTER — Telehealth: Payer: Self-pay

## 2016-11-03 DIAGNOSIS — Z85828 Personal history of other malignant neoplasm of skin: Secondary | ICD-10-CM

## 2016-11-03 NOTE — Telephone Encounter (Signed)
PATIENT HAD HER MEDICARE PHYSICAL DONE WITH CHELLE ON September 22, 2016. SHE FOR GOT TO ASK CHELLE TO PUT IN A REFERRAL FOR HER TO SEE DR. SAVANA LAMAR AT [facility] HER ANNUAL VISIT. SHE HAD AN APPOINTMENT, BUT THEY TOLD HER SHE COULD NOT BE SEEN UNTIL HER PCP CALLED. THEIR PHONE NUMBER IS [phone]. BEST PHONE [phone] (CELL) [place]

## 2016-11-04 DIAGNOSIS — G4733 Obstructive sleep apnea (adult) (pediatric): Secondary | ICD-10-CM | POA: Diagnosis not present

## 2016-11-04 NOTE — Telephone Encounter (Signed)
Chelle, FYI, you were out of the office for a couple of days so went ahead and put this order in w/ assoc Dx of "hx of basal cell carcinoma" (on problem list). Notified pt. This is necessary bc she has [employer] ins.

## 2016-11-05 DIAGNOSIS — H2511 Age-related nuclear cataract, right eye: Secondary | ICD-10-CM | POA: Diagnosis not present

## 2016-11-05 DIAGNOSIS — H401131 Primary open-angle glaucoma, bilateral, mild stage: Secondary | ICD-10-CM | POA: Diagnosis not present

## 2016-11-06 NOTE — Telephone Encounter (Signed)
Excellent

## 2016-11-23 ENCOUNTER — Other Ambulatory Visit: Payer: Self-pay | Admitting: Family Medicine

## 2016-11-23 DIAGNOSIS — N6489 Other specified disorders of breast: Secondary | ICD-10-CM

## 2016-12-09 ENCOUNTER — Ambulatory Visit
Admission: RE | Admit: 2016-12-09 | Discharge: 2016-12-09 | Disposition: A | Discharge status: Home / Self Care | Payer: Medicare HMO | Source: Ambulatory Visit | Attending: Physician Assistant | Admitting: Physician Assistant

## 2016-12-09 ENCOUNTER — Ambulatory Visit
Admission: RE | Admit: 2016-12-09 | Discharge: 2016-12-09 | Disposition: A | Discharge status: Home / Self Care | Payer: Medicare HMO | Source: Ambulatory Visit | Attending: Family Medicine | Admitting: Family Medicine

## 2016-12-09 DIAGNOSIS — N6489 Other specified disorders of breast: Secondary | ICD-10-CM

## 2016-12-09 DIAGNOSIS — N6310 Unspecified lump in the right breast, unspecified quadrant: Secondary | ICD-10-CM | POA: Diagnosis not present

## 2016-12-09 DIAGNOSIS — E2839 Other primary ovarian failure: Secondary | ICD-10-CM

## 2016-12-09 DIAGNOSIS — M8589 Other specified disorders of bone density and structure, multiple sites: Secondary | ICD-10-CM | POA: Diagnosis not present

## 2016-12-09 DIAGNOSIS — M810 Age-related osteoporosis without current pathological fracture: Secondary | ICD-10-CM

## 2016-12-09 DIAGNOSIS — Z780 Asymptomatic menopausal state: Secondary | ICD-10-CM | POA: Diagnosis not present

## 2016-12-31 DIAGNOSIS — Z85828 Personal history of other malignant neoplasm of skin: Secondary | ICD-10-CM | POA: Diagnosis not present

## 2016-12-31 DIAGNOSIS — D1801 Hemangioma of skin and subcutaneous tissue: Secondary | ICD-10-CM | POA: Diagnosis not present

## 2016-12-31 DIAGNOSIS — L821 Other seborrheic keratosis: Secondary | ICD-10-CM | POA: Diagnosis not present

## 2016-12-31 DIAGNOSIS — L814 Other melanin hyperpigmentation: Secondary | ICD-10-CM | POA: Diagnosis not present

## 2017-03-09 ENCOUNTER — Encounter: Payer: Self-pay | Admitting: Neurology

## 2017-03-10 ENCOUNTER — Encounter: Payer: Self-pay | Admitting: Nurse Practitioner

## 2017-03-10 ENCOUNTER — Ambulatory Visit (INDEPENDENT_AMBULATORY_CARE_PROVIDER_SITE_OTHER): Payer: Medicare HMO | Admitting: Nurse Practitioner

## 2017-03-10 ENCOUNTER — Encounter (INDEPENDENT_AMBULATORY_CARE_PROVIDER_SITE_OTHER): Payer: Self-pay

## 2017-03-10 VITALS — BP 127/80 | HR 60 | Ht 62.5 in | Wt 171.0 lb

## 2017-03-10 DIAGNOSIS — G4733 Obstructive sleep apnea (adult) (pediatric): Secondary | ICD-10-CM | POA: Diagnosis not present

## 2017-03-10 DIAGNOSIS — G478 Other sleep disorders: Secondary | ICD-10-CM

## 2017-03-10 DIAGNOSIS — Z9989 Dependence on other enabling machines and devices: Secondary | ICD-10-CM | POA: Diagnosis not present

## 2017-03-10 NOTE — Progress Notes (Signed)
[facility]  PATIENT: Nicole Holt DOB: 23-Sep-1941   REASON FOR VISIT: Follow-up for obstructive sleep apnea on CPAP, obesity, nonfluent speech at last visit, word finding problems HISTORY FROM: Patient    HISTORY OF PRESENT ILLNESS:HISTORT CDMrs. Holt originally presented upon referral of Nicole Holt in 2013 . She is a right handed , chronically hoarse ,caucasian female patient , who had reportedly loudly snoring and often woke herself from her snoring. She also woke up with a dry mouth and felt not rested or restored in the mornings. She gained weight , became less active. Or roll prior to her going sleep evaluation she endorsed getting 6-7 hours of nocturnal sleep. She also has suffered from allergic rhinitis and used an Counsellor. Her grandchildren had reportedly been bothered by her loud snoring , which made her seek medical advice. She endorsed the Epworth score at 14 of 21 points at the time ( 2013) . The patient's sleep study in November 2013 documented AHI of 9.2 and RDI of 18, not SPLIT - but she was hypoxic for over one hour at night.  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  She returned for a CPAP auto- titration and seemed to respond best to only 5 cm water pressure. 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 As she started on CPAP later that year , her rhinitis improved and she had no further sinusitis exacerbations.  She goes to bed at 11 Pm and falls asleep within 5 minutes, has a bathroom break at 2.30 AM and rarely another at 4.30. Unless she has a lot on her mind, she will easily fall asleep again.  Total sleep time is now 7-8 hours nightly, no daytime naps.  Her Epworth sleepiness score had been reduced  after she initiated CPAP he was and is currently at 6 points her fatigue severity score it on the 18 points , both greatly reduced. . Her geriatric depression score was at 3 points, not indicative of depression. Unable today to review the patient's compliance record she has 100% compliance on 180 date download, dated 02-02-2014. The nightly use of 7 hours 55 minutes with CPAP her residual AHI is 0.9 at that but CPAP pressure of 5 cm water. She loves her CPAP ! No neck , face or ENT surgery history .   Interval history since the patient underwent a CPAP titration on 09-27-12 as been followed yearly in our office.  Today is 03-12-15,CD the patient is currently under Nicole Holt coverage and uses the only available DME, preop. She has a CPAP machine by Nicole Holt system 1 with heated humidifier, coiled air tubing and uses a S-M size nasal mask. She has still other size liners, but only this one fits.  She needs to reorder this size through upper area. Her compliance information is available today downloaded in office on with a date 03-11-15 she's 100% compliant forward 30 out of 30 days of use 100% for over 4 hours of daily use with an average user time of 8 hours and 17 minutes. Her average AHI is 0.8 and a CPAP pressure of only 5 cm water.  her spepech is non fluent at the beginning of today's visit, but improved. She looks for words.  UPDATE 05/19/2017CM Nicole Holt, 76 year old returns for yearly follow-up for CPAP compliance. In addition she had some nonfluent speech at her last visit and Nicole Holt has requested Nicole Holt  testing as well. Her CPAP compliance is  100% with average usage 8 hours 13 minutes. Her average AHI is 0.8. Pressure is at 5 cm. No leaks ESS score 4, fatigue severity scale 18. MOCA -25 out of 30. A tremor is noted to voice. Her sister has Parkinson's disease. She returns for reevaluation. Previous records reviewed UPDATE 03/10/17 CM Nicole Holt, 76 year old  female returns for follow-up with history of obstructive sleep apnea on CPAP. She had also had some word finding problems in the past and we will repeat her Nicole Holt. I reviewed the patient's CPAP compliance data from 02/08/2014 to 03/09/2017, which is a total of 30 days, during which time the patient used CPAP every day . The average usage for all days was 8 hours and 44 minutes. The percent used days greater than 4 hours was 100% indicating excellent compliance. The residual AHI was 0.5per hour, indicating a good treatment pressure of 5 cwp The  data will be reviewed again with the patient at the next office visit, which is currently routinely scheduled for 1 year  provide feedback and additional troubleshooting if need be. She is not longer complaining of word finding issues MOCA today 28/30. She has no recall items. She returns for reevaluation     REVIEW OF SYSTEMS: Full 14 system review of systems performed and notable only for those listed, all others are neg:  Constitutional: neg  Cardiovascular: neg Ear/Nose/Throat: neg  Skin: neg Eyes: neg Respiratory: neg Gastroitestinal: neg  Hematology/Lymphatic: neg  Endocrine: neg Musculoskeletal:neg Allergy/Immunology: neg Neurological: neg Psychiatric: Anxiety depression Sleep : Obstructive sleep apnea on CPAP   ALLERGIES: Allergies  Allergen Reactions  . Codeine Nausea Only  . Estrogens Conjugated Other (See Comments)    HA  . Paroxetine Hcl Other (See Comments)    Felt like she was "out of her head"    HOME MEDICATIONS: Outpatient Medications Prior to Visit  Medication Sig Dispense Refill  . diazepam (VALIUM) 5 MG tablet Take 1 tablet (5 mg total) by mouth every 6 (six) hours as needed. for anxiety 30 tablet 0  . Travoprost, BAK Free, (TRAVATAN) 0.004 % SOLN ophthalmic solution Place 1 drop into both eyes at bedtime.     No facility-administered medications prior to visit.     PAST MEDICAL HISTORY: Past Medical  History:  Diagnosis Date  . Allergy   . Anxiety    panic attack  . Cataract   . Depression   . Glaucoma   . Glaucoma   . Hearing loss   . 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 Migraines    ocular  . OSA on CPAP 02/02/2014   PSG 09-27-2012 RDI 18.9 and AHi 9.8, Epworth 15 .titrated to only 5 cm water.   . Osteoporosis   . Sleep apnea   . Tinnitus     PAST SURGICAL HISTORY: Past Surgical History:  Procedure Laterality Date  . cataract surgery    . COSMETIC SURGERY      FAMILY HISTORY: Family History  Problem Relation Age of Onset  . Osteoporosis Mother   . Stroke Mother   . Hyperlipidemia Mother   . Hypertension Mother   . Kidney disease Father   . Congestive Heart Failure Father   . Heart disease Father   .  Hyperlipidemia Father   . Stroke Sister   . Parkinson's disease Sister   . Heart disease Maternal Grandmother   . Stroke Maternal Grandfather   . Stroke Paternal Grandmother   . Hypertension Brother   . Arthritis/Rheumatoid    . Parkinson's disease    . Heart disease    . Polymyalgia rheumatica Sister     SOCIAL HISTORY: Social History   Social History  . Marital status: Married    Spouse name: Legrand Como  . Number of children: 2  . Years of education: 16   Occupational History  . Retired    Social History Main Topics  . Smoking status: Former Research scientist (life sciences)  . Smokeless tobacco: Never Used     Comment: Quit in 1987  . Alcohol use 7.2 oz/week    12 Glasses of wine per week     Comment: wine or alcohol daily  . Drug use: No  . Sexual activity: Yes   Other Topics Concern  . Not on file   Social History Narrative   Patient is married Legrand Como) and lives with her husband.   Patient is retired.   Patient has two children.   Patient is right-handed.   Patient has a college education.   Patient drinks two cups of coffee daily.              PHYSICAL EXAM  Vitals:   03/10/17 1046  BP: 127/80  Pulse: 60  Weight: 171 lb  (77.6 kg)  Height: 5' 2.5" (1.588 m)   Body mass index is 30.78 kg/m.  Generalized: Well developed, in no acute distress ,Well-groomed Head: normocephalic and atraumatic,. Oropharynx benign  Neck: Supple, no carotid bruits , neck circumference 15.5 Cardiac: Regular rate rhythm, no murmur  Musculoskeletal: No deformity   Neurological examination   Mentation: Alert oriented to time, place, history taking. Attention span and concentration appropriate. Recent and remote memory intact.  Follows all commands speech and language fluent. MOCA 28/30. ESS 4 fatigue severity score 17 Montreal Cognitive Assessment  03/10/2017 03/10/2016  Visuospatial/ Executive (0/5) 5 3  Naming (0/3) 3 3  Attention: Read list of digits (0/2) 2 2  Attention: Read list of letters (0/1) 1 1  Attention: Serial 7 subtraction starting at 100 (0/3) 2 3  Language: Repeat phrase (0/2) 1 1  Language : Fluency (0/1) 1 0  Abstraction (0/2) 2 2  Delayed Recall (0/5) 5 3  Orientation (0/6) 6 6  Total 28 24  Adjusted Score (based on education) - 25   Cranial nerve II-XII: Pupils were equal round reactive to light extraocular movements were full, visual field were full on confrontational test. Facial sensation and strength were normal. hearing was intact to finger rubbing bilaterally. Uvula tongue midline. head turning and shoulder shrug were normal and symmetric.Tongue protrusion into cheek strength was normal. Vocal tremor Motor: normal bulk and tone, full strength in the BUE, BLE, fine finger movements normal, no pronator drift. No focal weakness Sensory: normal and symmetric to light touch,  in the upper and lower extremities Coordination: finger-nose-finger, heel-to-shin bilaterally, no dysmetria Reflexes: Brachioradialis 2/2, biceps 2/2, triceps 2/2, patellar 2/2, Achilles 2/2, plantar responses were flexor bilaterally. Gait and Station: Rising up from seated position without assistance, normal stance,  moderate stride,  good arm swing, smooth turning, able to perform tiptoe, and heel walking without difficulty. Tandem gait is steady  DIAGNOSTIC DATA (LABS, IMAGING, TESTING) - I reviewed patient records, labs, notes, testing and imaging myself where available.  Lab  Results  Component Value Date   WBC 5.3 09/22/2016   HGB 14.5 09/22/2016   HCT 43.4 09/22/2016   MCV 91.8 09/22/2016   PLT 267 09/22/2016      Component Value Date/Time   NA 138 09/22/2016 1054   K 4.3 09/22/2016 1054   CL 103 09/22/2016 1054   CO2 27 09/22/2016 1054   GLUCOSE 89 09/22/2016 1054   BUN 15 09/22/2016 1054   CREATININE 0.98 (H) 09/22/2016 1054   CALCIUM 9.1 09/22/2016 1054   PROT 7.1 09/22/2016 1054   ALBUMIN 4.3 09/22/2016 1054   AST 23 09/22/2016 1054   ALT 24 09/22/2016 1054   ALKPHOS 59 09/22/2016 1054   BILITOT 0.6 09/22/2016 1054   GFRNONAA 67 09/12/2014 1730   GFRAA 77 09/12/2014 1730   Lab Results  Component Value Date   CHOL 215 (H) 09/22/2016   HDL 53 09/22/2016   LDLCALC 114 (H) 09/22/2016   TRIG 240 (H) 09/22/2016   CHOLHDL 4.1 09/22/2016    Lab Results  Component Value Date   TSH 1.36 09/22/2016      ASSESSMENT AND PLAN  76 y.o. year old female  has a past medical history of Anxiety;  Depression;  Sleep apnea;  OSA on CPAP (02/02/2014). And UARS here to follow up. No further word finding problems.   PLAN CPAP compliance is  100% with average usage 8 hours 82minutes. Her average AHI is 0.5. Pressure is at 5 cm. No leaks ESS score 4, fatigue severity scale 17. MOCA -28 out of 30.  PLAN: CPAP compliance is excellent 100% compliance for 30 days, no change in settings  Will follow memory score over time Follow-up yearly and when necessary next with Nicole Holt I spent 25 minutes in total face to face time with the patient more than 50% of which was spent counseling and coordination of care, reviewing CPAP test results and MOCA reviewing medications and discussing and reviewing the diagnosis of  obstructive sleep apnea and word finding difficulties Which appear to be stable at this time . , Ray[place] Ma, [facility], APRN  [facility] [address], [place], [place] [address] [phone]

## 2017-03-10 NOTE — Patient Instructions (Signed)
CPAP compliance is excellent 100% compliance for 30 days Will follow memory score over time Follow-up yearly and when necessary

## 2017-03-10 NOTE — Progress Notes (Signed)
I agree with the assessment and plan as directed by NP .The patient is known to me .   Tareva Leske, MD  

## 2017-06-01 DIAGNOSIS — H2511 Age-related nuclear cataract, right eye: Secondary | ICD-10-CM | POA: Diagnosis not present

## 2017-06-01 DIAGNOSIS — H401131 Primary open-angle glaucoma, bilateral, mild stage: Secondary | ICD-10-CM | POA: Diagnosis not present

## 2017-06-16 DIAGNOSIS — G4733 Obstructive sleep apnea (adult) (pediatric): Secondary | ICD-10-CM | POA: Diagnosis not present

## 2017-06-28 ENCOUNTER — Other Ambulatory Visit: Payer: Self-pay | Admitting: Family Medicine

## 2017-06-28 ENCOUNTER — Other Ambulatory Visit: Payer: Self-pay

## 2017-06-28 DIAGNOSIS — N63 Unspecified lump in unspecified breast: Secondary | ICD-10-CM

## 2017-07-01 ENCOUNTER — Ambulatory Visit
Admission: RE | Admit: 2017-07-01 | Discharge: 2017-07-01 | Disposition: A | Discharge status: Home / Self Care | Payer: Medicare HMO | Source: Ambulatory Visit | Attending: Family Medicine | Admitting: Family Medicine

## 2017-07-01 ENCOUNTER — Ambulatory Visit: Payer: Medicare HMO

## 2017-07-01 DIAGNOSIS — R928 Other abnormal and inconclusive findings on diagnostic imaging of breast: Secondary | ICD-10-CM | POA: Diagnosis not present

## 2017-07-01 DIAGNOSIS — N63 Unspecified lump in unspecified breast: Secondary | ICD-10-CM

## 2017-08-04 ENCOUNTER — Other Ambulatory Visit: Payer: Self-pay | Admitting: Physician Assistant

## 2017-08-04 DIAGNOSIS — F419 Anxiety disorder, unspecified: Secondary | ICD-10-CM

## 2017-08-05 NOTE — Telephone Encounter (Signed)
Meds ordered this encounter  Medications  . diazepam (VALIUM) 5 MG tablet    Sig: TAKE ONE TABLET BY MOUTH EVERY 6 HOURS AS NEEDED FOR ANXIETY    Dispense:  30 tablet    Refill:  0

## 2017-08-05 NOTE — Telephone Encounter (Signed)
Rx req Valium Sent to [facility]

## 2017-08-06 ENCOUNTER — Telehealth: Payer: Self-pay

## 2017-08-06 NOTE — Telephone Encounter (Signed)
Pt advised that rx for valium has been faxed to pharmacy.

## 2017-09-27 ENCOUNTER — Ambulatory Visit (INDEPENDENT_AMBULATORY_CARE_PROVIDER_SITE_OTHER): Payer: Medicare HMO

## 2017-09-27 VITALS — BP 130/82 | HR 76 | Temp 97.8°F | Ht 63.0 in | Wt 168.4 lb

## 2017-09-27 DIAGNOSIS — Z0000 Encounter for general adult medical examination without abnormal findings: Secondary | ICD-10-CM | POA: Diagnosis not present

## 2017-09-27 DIAGNOSIS — E781 Pure hyperglyceridemia: Secondary | ICD-10-CM

## 2017-09-27 LAB — COMPREHENSIVE METABOLIC PANEL
A/G RATIO: 1.7 (ref 1.2–2.2)
ALBUMIN: 4.5 g/dL (ref 3.5–4.8)
ALK PHOS: 69 IU/L (ref 39–117)
ALT: 26 IU/L (ref 0–32)
AST: 26 IU/L (ref 0–40)
BILIRUBIN TOTAL: 0.4 mg/dL (ref 0.0–1.2)
BUN / CREAT RATIO: 15 (ref 12–28)
BUN: 14 mg/dL (ref 8–27)
CHLORIDE: 102 mmol/L (ref 96–106)
CO2: 23 mmol/L (ref 20–29)
Calcium: 9.6 mg/dL (ref 8.7–10.3)
Creatinine, Ser: 0.95 mg/dL (ref 0.57–1.00)
GFR calc non Af Amer: 59 mL/min/{1.73_m2} — ABNORMAL LOW (ref >59)
GFR, EST AFRICAN AMERICAN: 68 mL/min/{1.73_m2} (ref >59)
GLUCOSE: 98 mg/dL (ref 65–99)
Globulin, Total: 2.7 g/dL (ref 1.5–4.5)
POTASSIUM: 4.5 mmol/L (ref 3.5–5.2)
Sodium: 140 mmol/L (ref 134–144)
TOTAL PROTEIN: 7.2 g/dL (ref 6.0–8.5)

## 2017-09-27 LAB — CBC WITH DIFFERENTIAL/PLATELET
BASOS ABS: 0 10*3/uL (ref 0.0–0.2)
Basos: 1 %
EOS (ABSOLUTE): 0.1 10*3/uL (ref 0.0–0.4)
Eos: 2 %
Hematocrit: 42.6 % (ref 34.0–46.6)
Hemoglobin: 14.1 g/dL (ref 11.1–15.9)
Immature Grans (Abs): 0 10*3/uL (ref 0.0–0.1)
Immature Granulocytes: 1 %
LYMPHS ABS: 1.6 10*3/uL (ref 0.7–3.1)
Lymphs: 36 %
MCH: 30.9 pg (ref 26.6–33.0)
MCHC: 33.1 g/dL (ref 31.5–35.7)
MCV: 93 fL (ref 79–97)
MONOS ABS: 0.4 10*3/uL (ref 0.1–0.9)
Monocytes: 9 %
NEUTROS ABS: 2.3 10*3/uL (ref 1.4–7.0)
Neutrophils: 51 %
PLATELETS: 268 10*3/uL (ref 150–379)
RBC: 4.57 x10E6/uL (ref 3.77–5.28)
RDW: 14.2 % (ref 12.3–15.4)
WBC: 4.4 10*3/uL (ref 3.4–10.8)

## 2017-09-27 LAB — LIPID PANEL
CHOL/HDL RATIO: 3.1 ratio (ref 0.0–4.4)
Cholesterol, Total: 197 mg/dL (ref 100–199)
HDL: 63 mg/dL (ref >39)
LDL Calculated: 102 mg/dL — ABNORMAL HIGH (ref 0–99)
TRIGLYCERIDES: 158 mg/dL — AB (ref 0–149)
VLDL CHOLESTEROL CAL: 32 mg/dL (ref 5–40)

## 2017-09-27 NOTE — Patient Instructions (Addendum)
Nicole Holt , Thank you for taking time to come for your Medicare Wellness Visit. I appreciate your ongoing commitment to your health goals. Please review the following plan we discussed and let me know if I can assist you in the future.   Screening recommendations/referrals: Colonoscopy: up to date, next due 11/17/2020 Mammogram: up to date, next due 07/02/2019 Bone Density: up to date, next due 12/09/2018 Recommended yearly ophthalmology/optometry visit for glaucoma screening and checkup Recommended yearly dental visit for hygiene and checkup  Vaccinations: Influenza vaccine: declined Pneumococcal vaccine: up to date Tdap vaccine: up to date, next due 11/02/2017 Shingles vaccine: up to date, Can receive new vaccine at your local pharmacy    Advanced directives: Please bring a copy of your POA (Power of [place]) and/or Living Will to your next appointment.   Conditions/risks identified: Try to decrease your alcohol intake daily.   Next appointment: 10/01/17 @ 10:40 am with [place] 75 Years and Older, Female Preventive care refers to lifestyle choices and visits with your health care provider that can promote health and wellness. What does preventive care include?  A yearly physical exam. This is also called an annual well check.  Dental exams once or twice a year.  Routine eye exams. Ask your health care provider how often you should have your eyes checked.  Personal lifestyle choices, including:  Daily care of your teeth and gums.  Regular physical activity.  Eating a healthy diet.  Avoiding tobacco and drug use.  Limiting alcohol use.  Practicing safe sex.  Taking low-dose aspirin every day.  Taking vitamin and mineral supplements as recommended by your health care provider. What happens during an annual well check? The services and screenings done by your health care provider during your annual well check will depend on your age, overall  health, lifestyle risk factors, and family history of disease. Counseling  Your health care provider may ask you questions about your:  Alcohol use.  Tobacco use.  Drug use.  Emotional well-being.  Home and relationship well-being.  Sexual activity.  Eating habits.  History of falls.  Memory and ability to understand (cognition).  Work and work Statistician.  Reproductive health. Screening  You may have the following tests or measurements:  Height, weight, and BMI.  Blood pressure.  Lipid and cholesterol levels. These may be checked every 5 years, or more frequently if you are over 75 years old.  Skin check.  Lung cancer screening. You may have this screening every year starting at age 67 if you have a 30-pack-year history of smoking and currently smoke or have quit within the past 15 years.  Fecal occult blood test (FOBT) of the stool. You may have this test every year starting at age 62.  Flexible sigmoidoscopy or colonoscopy. You may have a sigmoidoscopy every 5 years or a colonoscopy every 10 years starting at age 75.  Hepatitis C blood test.  Hepatitis B blood test.  Sexually transmitted disease (STD) testing.  Diabetes screening. This is done by checking your blood sugar (glucose) after you have not eaten for a while (fasting). You may have this done every 1-3 years.  Bone density scan. This is done to screen for osteoporosis. You may have this done starting at age 26.  Mammogram. This may be done every 1-2 years. Talk to your health care provider about how often you should have regular mammograms. Talk with your health care provider about your test results, treatment options, and  if necessary, the need for more tests. Vaccines  Your health care provider may recommend certain vaccines, such as:  Influenza vaccine. This is recommended every year.  Tetanus, diphtheria, and acellular pertussis (Tdap, Td) vaccine. You may need a Td booster every 10  years.  Zoster vaccine. You may need this after age 52.  Pneumococcal 13-valent conjugate (PCV13) vaccine. One dose is recommended after age 75.  Pneumococcal polysaccharide (PPSV23) vaccine. One dose is recommended after age 40. Talk to your health care provider about which screenings and vaccines you need and how often you need them. This information is not intended to replace advice given to you by your health care provider. Make sure you discuss any questions you have with your health care provider. Document Released: 11/15/2015 Document Revised: 07/08/2016 Document Reviewed: 08/20/2015 Elsevier Interactive Patient Education  2017 [place] Prevention in the Home Falls can cause injuries. They can happen to people of all ages. There are many things you can do to make your home safe and to help prevent falls. What can I do on the outside of my home?  Regularly fix the edges of walkways and driveways and fix any cracks.  Remove anything that might make you trip as you walk through a door, such as a raised step or threshold.  Trim any bushes or trees on the path to your home.  Use bright outdoor lighting.  Clear any walking paths of anything that might make someone trip, such as rocks or tools.  Regularly check to see if handrails are loose or broken. Make sure that both sides of any steps have handrails.  Any raised decks and porches should have guardrails on the edges.  Have any leaves, snow, or ice cleared regularly.  Use sand or salt on walking paths during winter.  Clean up any spills in your garage right away. This includes oil or grease spills. What can I do in the bathroom?  Use night lights.  Install grab bars by the toilet and in the tub and shower. Do not use towel bars as grab bars.  Use non-skid mats or decals in the tub or shower.  If you need to sit down in the shower, use a plastic, non-slip stool.  Keep the floor dry. Clean up any water that  spills on the floor as soon as it happens.  Remove soap buildup in the tub or shower regularly.  Attach bath mats securely with double-sided non-slip rug tape.  Do not have throw rugs and other things on the floor that can make you trip. What can I do in the bedroom?  Use night lights.  Make sure that you have a light by your bed that is easy to reach.  Do not use any sheets or blankets that are too big for your bed. They should not hang down onto the floor.  Have a firm chair that has side arms. You can use this for support while you get dressed.  Do not have throw rugs and other things on the floor that can make you trip. What can I do in the kitchen?  Clean up any spills right away.  Avoid walking on wet floors.  Keep items that you use a lot in easy-to-reach places.  If you need to reach something above you, use a strong step stool that has a grab bar.  Keep electrical cords out of the way.  Do not use floor polish or wax that makes floors slippery. If you  must use wax, use non-skid floor wax.  Do not have throw rugs and other things on the floor that can make you trip. What can I do with my stairs?  Do not leave any items on the stairs.  Make sure that there are handrails on both sides of the stairs and use them. Fix handrails that are broken or loose. Make sure that handrails are as long as the stairways.  Check any carpeting to make sure that it is firmly attached to the stairs. Fix any carpet that is loose or worn.  Avoid having throw rugs at the top or bottom of the stairs. If you do have throw rugs, attach them to the floor with carpet tape.  Make sure that you have a light switch at the top of the stairs and the bottom of the stairs. If you do not have them, ask someone to add them for you. What else can I do to help prevent falls?  Wear shoes that:  Do not have high heels.  Have rubber bottoms.  Are comfortable and fit you well.  Are closed at the  toe. Do not wear sandals.  If you use a stepladder:  Make sure that it is fully opened. Do not climb a closed stepladder.  Make sure that both sides of the stepladder are locked into place.  Ask someone to hold it for you, if possible.  Clearly mark and make sure that you can see:  Any grab bars or handrails.  First and last steps.  Where the edge of each step is.  Use tools that help you move around (mobility aids) if they are needed. These include:  Canes.  Walkers.  Scooters.  Crutches.  Turn on the lights when you go into a dark area. Replace any light bulbs as soon as they burn out.  Set up your furniture so you have a clear path. Avoid moving your furniture around.  If any of your floors are uneven, fix them.  If there are any pets around you, be aware of where they are.  Review your medicines with your doctor. Some medicines can make you feel dizzy. This can increase your chance of falling. Ask your doctor what other things that you can do to help prevent falls. This information is not intended to replace advice given to you by your health care provider. Make sure you discuss any questions you have with your health care provider. Document Released: 08/15/2009 Document Revised: 03/26/2016 Document Reviewed: 11/23/2014 Elsevier Interactive Patient Education  2017 [employer].

## 2017-09-27 NOTE — Progress Notes (Signed)
Subjective:   Nicole Holt is a 76 y.o. female who presents for Medicare Annual (Subsequent) preventive examination.  Review of Systems:  N/A Cardiac Risk Factors include: advanced age (>66men, >42 women);dyslipidemia     Objective:     Vitals: BP 130/82   Pulse 76   Temp 97.8 F (36.6 C) (Oral)   Ht 5\' 3"  (1.6 m)   Wt 168 lb 6 oz (76.4 kg)   SpO2 96%   BMI 29.83 kg/m   Body mass index is 29.83 kg/m.   Tobacco Social History   Tobacco Use  Smoking Status Former Smoker  Smokeless Tobacco Never Used  Tobacco Comment   Quit in 1987     Counseling given: Not Answered Comment: Quit in 1987   Past Medical History:  Diagnosis Date  . Allergy   . Anxiety    panic attack  . Cataract   . Depression   . Glaucoma   . Glaucoma   . Hearing loss   . 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 Migraines    ocular  . OSA on CPAP 02/02/2014   PSG 09-27-2012 RDI 18.9 and AHi 9.8, Epworth 15 .titrated to only 5 cm water.   . Osteoporosis   . Sleep apnea   . Tinnitus    Past Surgical History:  Procedure Laterality Date  . cataract surgery    . COSMETIC SURGERY     Family History  Problem Relation Age of Onset  . Osteoporosis Mother   . Stroke Mother   . Hyperlipidemia Mother   . Hypertension Mother   . Kidney disease Father   . Congestive Heart Failure Father   . Heart disease Father   . Hyperlipidemia Father   . Stroke Sister   . Parkinson's disease Sister   . Heart disease Maternal Grandmother   . Stroke Maternal Grandfather   . Stroke Paternal Grandmother   . Hypertension Brother   . Diabetes Brother   . Arthritis/Rheumatoid Unknown   . Parkinson's disease Unknown   . Heart disease Unknown   . Polymyalgia rheumatica Sister    Social History   Substance and Sexual Activity  Sexual Activity Yes    Outpatient Encounter Medications as of 09/27/2017  Medication Sig  . diazepam (VALIUM) 5 MG tablet TAKE ONE TABLET BY MOUTH EVERY  6 HOURS AS NEEDED FOR ANXIETY  . Travoprost, BAK Free, (TRAVATAN) 0.004 % SOLN ophthalmic solution Place 1 drop into both eyes at bedtime.   No facility-administered encounter medications on file as of 09/27/2017.     Activities of Daily Living In your present state of health, do you have any difficulty performing the following activities: 09/27/2017  Hearing? Y  Comment Patient has to wear hearing aids sometimes  Vision? N  Difficulty concentrating or making decisions? Y  Comment Patient has issues with remembering names and objects  Walking or climbing stairs? N  Dressing or bathing? N  Doing errands, shopping? N  Preparing Food and eating ? N  Using the Toilet? N  In the past six months, have you accidently leaked urine? N  Do you have problems with loss of bowel control? N  Managing your Medications? N  Managing your Finances? N  Housekeeping or managing your Housekeeping? N  Some recent data might be hidden    Patient Care Team: Harrison Mons, PA-C as PCP - General (Family Medicine) Dennie Bible, NP as Nurse Practitioner (Neurology) Marylynn Pearson, MD  as Consulting Physician (Ophthalmology)    Assessment:     Exercise Activities and Dietary recommendations Current Exercise Habits: Structured exercise class, Type of exercise: stretching;treadmill, Time (Minutes): > 60(120 minutes), Frequency (Times/Week): 2, Weekly Exercise (Minutes/Week): 0, Intensity: Mild, Exercise limited by: None identified  Goals    . Reduce alcohol intake     Patient states that she wants to try to decrease her alcohol intake daily.       Fall Risk Fall Risk  09/27/2017 09/22/2016 03/10/2016 09/18/2015 09/12/2014  Falls in the past year? No No No No No   Depression Screen PHQ 2/9 Scores 09/27/2017 09/22/2016 09/18/2015 09/12/2014  PHQ - 2 Score 2 1 4  0  PHQ- 9 Score 7 - 7 -     Cognitive Function   Montreal Cognitive Assessment  03/10/2017 03/10/2016  Visuospatial/ Executive  (0/5) 5 3  Naming (0/3) 3 3  Attention: Read list of digits (0/2) 2 2  Attention: Read list of letters (0/1) 1 1  Attention: Serial 7 subtraction starting at 100 (0/3) 2 3  Language: Repeat phrase (0/2) 1 1  Language : Fluency (0/1) 1 0  Abstraction (0/2) 2 2  Delayed Recall (0/5) 5 3  Orientation (0/6) 6 6  Total 28 24  Adjusted Score (based on education) - 25   6CIT Screen 09/27/2017  What Year? 0 points  What month? 0 points  What time? 0 points  Count back from 20 0 points  Months in reverse 0 points  Repeat phrase 6 points  Total Score 6    Immunization History  Administered Date(s) Administered  . Pneumococcal Conjugate-13 09/22/2016  . Pneumococcal Polysaccharide-23 07/27/2012  . Td 11/03/2007  . Zoster 10/02/2015   Screening Tests Health Maintenance  Topic Date Due  . INFLUENZA VACCINE  09/28/2027 (Originally 06/02/2017)  . TETANUS/TDAP  11/02/2017  . COLONOSCOPY  11/17/2020  . DEXA SCAN  Completed  . PNA vac Low Risk Adult  Completed      Plan:   I have personally reviewed and noted the following in the patient's chart:   . Medical and social history . Use of alcohol, tobacco or illicit drugs  . Current medications and supplements . Functional ability and status . Nutritional status . Physical activity . Advanced directives . List of other physicians . Hospitalizations, surgeries, and ER visits in previous 12 months . Vitals . Screenings to include cognitive, depression, and falls . Referrals and appointments  In addition, I have reviewed and discussed with patient certain preventive protocols, quality metrics, and best practice recommendations. A written personalized care plan for preventive services as well as general preventive health recommendations were provided to patient.   Patient declined flu vaccine 1. Hypertriglyceridemia - Lipid panel - Comprehensive metabolic panel - CBC with Differential/Platelet  2. Encounter for Medicare annual  wellness exam   Andrez Grime, LPN  [id number]

## 2017-10-01 ENCOUNTER — Other Ambulatory Visit: Payer: Self-pay

## 2017-10-01 ENCOUNTER — Encounter: Payer: Self-pay | Admitting: Physician Assistant

## 2017-10-01 ENCOUNTER — Ambulatory Visit (INDEPENDENT_AMBULATORY_CARE_PROVIDER_SITE_OTHER): Payer: Medicare HMO | Admitting: Physician Assistant

## 2017-10-01 VITALS — BP 130/78 | HR 70 | Temp 98.6°F | Resp 18 | Ht 63.0 in | Wt 170.2 lb

## 2017-10-01 DIAGNOSIS — F419 Anxiety disorder, unspecified: Secondary | ICD-10-CM

## 2017-10-01 DIAGNOSIS — Z6830 Body mass index (BMI) 30.0-30.9, adult: Secondary | ICD-10-CM

## 2017-10-01 DIAGNOSIS — J3489 Other specified disorders of nose and nasal sinuses: Secondary | ICD-10-CM

## 2017-10-01 DIAGNOSIS — K1379 Other lesions of oral mucosa: Secondary | ICD-10-CM

## 2017-10-01 DIAGNOSIS — Z0000 Encounter for general adult medical examination without abnormal findings: Secondary | ICD-10-CM | POA: Diagnosis not present

## 2017-10-01 DIAGNOSIS — M810 Age-related osteoporosis without current pathological fracture: Secondary | ICD-10-CM

## 2017-10-01 DIAGNOSIS — E781 Pure hyperglyceridemia: Secondary | ICD-10-CM | POA: Diagnosis not present

## 2017-10-01 MED ORDER — FLUTICASONE PROPIONATE 50 MCG/ACT NA SUSP: 2.0000 | Freq: Every day | NASAL | 12 refills | Status: DC

## 2017-10-01 MED ORDER — DIAZEPAM 5 MG PO TABS: 5.0000 mg | ORAL_TABLET | Freq: Four times a day (QID) | ORAL | 0 refills | Status: DC | PRN

## 2017-10-01 NOTE — Progress Notes (Signed)
Subjective:    Patient ID: Nicole Holt, female    DOB: 02/04/1941, 76 y.o.   MRN: [id number]  HPI   Patient presents for annual physical exam. Main concern is sinus drainage for 8 months. Sometimes its so much that it feels like she's choking. Cold liquids and weather make it worse. This causes her to cough sometimes. Tried using her husband's nasal spray which helped to open up her airways.   Patient also reports mouth pain. Had bridge put in left lower jaw in September. Got infected and was put on Amoxicillin. Feels like part of her jaw is raw when flossing last molar on left lower jaw.    Mood has been okay. Takes Valium as needed for stressful situations. Goes to movies or works out to deal with stress. Has been having rosacea flare-ups more frequently especially with stressful situations.  Endorses stress sweat.  Review of Systems  Constitutional: Negative for chills, fatigue and fever.  HENT: Positive for dental problem, postnasal drip, rhinorrhea and tinnitus. Negative for congestion, ear pain, sinus pressure, sinus pain and sore throat.   Eyes: Negative for pain and discharge.  Respiratory: Positive for cough. Negative for shortness of breath.   Cardiovascular: Negative for chest pain and palpitations.  Gastrointestinal: Positive for constipation (uses warm sitz baths, helpful). Negative for abdominal pain, diarrhea, nausea and vomiting.  Genitourinary: Negative for difficulty urinating.  Musculoskeletal: Negative for arthralgias.  Skin: Negative for rash.  Neurological: Negative for dizziness and headaches.  Psychiatric/Behavioral: The patient is nervous/anxious.    Patient Active Problem List   Diagnosis Date Noted  . 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 Abnormal mammogram of right breast 09/22/2016  . Rosacea 09/18/2015  . S/P cataract surgery 09/18/2015  . BMI 30.0-30.9,adult 03/12/2015  . OSA on CPAP 02/02/2014  . Tremor, essential 02/02/2014  . UARS (upper airway  resistance syndrome) 02/02/2014  . Osteoporosis 07/28/2012  . Glaucoma 07/28/2012  . Restless leg syndrome 07/28/2012  . Anxiety 07/28/2012  . Benign positional vertigo 07/28/2012  . BMI 28.0-28.9,adult 07/27/2012  . Tinnitus 07/27/2012  . Hearing loss 07/27/2012  . Basal cell cancer 07/27/2012   Outpatient Encounter Medications as of 10/01/2017  Medication Sig  . diazepam (VALIUM) 5 MG tablet Take 1 tablet (5 mg total) by mouth every 6 (six) hours as needed. for anxiety  . Travoprost, BAK Free, (TRAVATAN) 0.004 % SOLN ophthalmic solution Place 1 drop into both eyes at bedtime.  . [DISCONTINUED] diazepam (VALIUM) 5 MG tablet TAKE ONE TABLET BY MOUTH EVERY 6 HOURS AS NEEDED FOR ANXIETY  . fluticasone (FLONASE) 50 MCG/ACT nasal spray Place 2 sprays into both nostrils daily.   No facility-administered encounter medications on file as of 10/01/2017.    Allergies  Allergen Reactions  . Codeine Nausea Only  . Estrogens Conjugated Other (See Comments)    HA  . Paroxetine Hcl Other (See Comments)    Felt like she was "out of her head"   Social History   Socioeconomic History  . Marital status: Married    Spouse name: Legrand Como  . Number of children: 2  . Years of education: 62  . Highest education level: Some college, no degree  Social Needs  . Financial resource strain: Not hard at all  . Food insecurity - worry: Never true  . Food insecurity - inability: Never true  . Transportation needs - medical: No  . Transportation needs - non-medical: No  Occupational History  . Occupation: Retired  Tobacco Use  .  Smoking status: Former Research scientist (life sciences)  . Smokeless tobacco: Never Used  . Tobacco comment: Quit in 1987  Substance and Sexual Activity  . Alcohol use: Yes    Alcohol/week: 7.2 oz    Types: 12 Glasses of wine per week    Comment: wine or alcohol daily  . Drug use: No  . Sexual activity: Yes  Other Topics Concern  . Not on file  Social History Narrative   Patient is married  Legrand Como) and lives with her husband.   Patient is retired.   Patient has two children.   Patient is right-handed.   Patient has a college education.   Patient drinks two cups of coffee daily.               Objective:   Physical Exam  Constitutional: She appears well-developed and well-nourished. No distress.  HENT:  Head: Normocephalic and atraumatic.  Right Ear: External ear normal.  Left Ear: External ear normal.  Nose: Nose normal.  Mouth/Throat: Oropharynx is clear and moist.  Eyes: Pupils are equal, round, and reactive to light.  Cardiovascular: Normal rate, regular rhythm, normal heart sounds and intact distal pulses.  Pulmonary/Chest: Effort normal and breath sounds normal.  Abdominal: Soft. Bowel sounds are normal. There is no tenderness.  Lymphadenopathy:    She has no cervical adenopathy.  Neurological: She is alert.  Skin: Skin is warm.  Psychiatric: She has a normal mood and affect.      Assessment & Plan:  Osteopenia, take calcium and vitamin D Flonase for sinus pressure  1. Annual physical exam Age relevant preventative care informational handouts provided.  2. Hypertriglyceridemia -Comprehensive metabolic panel done 14/43/15, abnormal findings: GFR 59 -CBC w/ diff done 09/27/17, results within normal limits -Lipid panel done  done 09/27/17, abnormal findings: triglycerides 158, calculated LDL 102 -Follow up for recheck of labs in an year.   3. Age-related osteoporosis without current pathological fracture Patient instructed to take OTC calcium 1500mg  and vitamin D 1000-2000 IU Look for a calcium supplement that contains magnesium 250-500mg .   4. Anxiety -TSH done 09/22/2016 was within normal limits - diazepam (VALIUM) 5 MG tablet; Take 1 tablet (5 mg total) by mouth every 6 (six) hours as needed. for anxiety  Dispense: 30 tablet; Refill: 0  5. Rosacea Anxiety related. Patient will continue taking Valium as needed.  6. Sinus drainage -  fluticasone (FLONASE) 50 MCG/ACT nasal spray; Place 2 sprays into both nostrils daily.  Dispense: 16 g; Refill: 12  7. Mouth pain Patient instructed to contact her dentist/oral surgeon regarding the irritation with flossing.  8. BMI 30.0-30.9,adult - Amb Ref to Medical Weight Management  [place], Student-PA

## 2017-10-01 NOTE — Assessment & Plan Note (Signed)
Osteopenia by DEXA 12/2016. Calcium 1500 mg daily (add Magnesium 250-500 as needed if increases constipation) and Vitamin D 1000-2000 IU daily. Repeat DEXA 12/2018.

## 2017-10-01 NOTE — Assessment & Plan Note (Signed)
Encouraged increased physical activity and healthy eating choices. She is interested in additional support. Referral to medical weight management.

## 2017-10-01 NOTE — Patient Instructions (Addendum)
Take OTC calcium 1530m and vitamin D 1000-2000 IU Look for a calcium supplement that contains magnesium 250-5040m   Contact your dentist/oral surgeon regarding the irritation with flossing.    IF you received an x-ray today, you will receive an [facility] from [facility]. Please contact [facility] at [phone] questions or concerns regarding your invoice.   IF you received labwork today, you will receive an invoice from [facility] at [phone] questions or concerns regarding your invoice.   Our billing staff will not be able to assist you with questions regarding bills from these companies.  You will be contacted with the lab results as soon as they are available. The fastest way to get your results is to activate your My Chart account. Instructions are located on the last page of this paperwork. If you have not heard from [facility] results in 2 weeks, please contact this office.     Preventive Care 6536ears and Older, Female Preventive care refers to lifestyle choices and visits with your health care provider that can promote health and wellness. What does preventive care include?  A yearly physical exam. This is also called an annual well check.  Dental exams once or twice a year.  Routine eye exams. Ask your health care provider how often you should have your eyes checked.  Personal lifestyle choices, including: ? Daily care of your teeth and gums. ? Regular physical activity. ? Eating a healthy diet. ? Avoiding tobacco and drug use. ? Limiting alcohol use. ? Practicing safe sex. ? Taking low-dose aspirin every day. ? Taking vitamin and mineral supplements as recommended by your health care provider. What happens during an annual well check? The services and screenings done by your health care provider during your annual well check will depend on your age, overall health, lifestyle risk factors, and family  history of disease. Counseling Your health care provider may ask you questions about your:  Alcohol use.  Tobacco use.  Drug use.  Emotional well-being.  Home and relationship well-being.  Sexual activity.  Eating habits.  History of falls.  Memory and ability to understand (cognition).  Work and work enStatistician Reproductive health.  Screening You may have the following tests or measurements:  Height, weight, and BMI.  Blood pressure.  Lipid and cholesterol levels. These may be checked every 5 years, or more frequently if you are over 5069ears old.  Skin check.  Lung cancer screening. You may have this screening every year starting at age 7454f you have a 30-pack-year history of smoking and currently smoke or have quit within the past 15 years.  Fecal occult blood test (FOBT) of the stool. You may have this test every year starting at age 75 Flexible sigmoidoscopy or colonoscopy. You may have a sigmoidoscopy every 5 years or a colonoscopy every 10 years starting at age 75 Hepatitis C blood test.  Hepatitis B blood test.  Sexually transmitted disease (STD) testing.  Diabetes screening. This is done by checking your blood sugar (glucose) after you have not eaten for a while (fasting). You may have this done every 1-3 years.  Bone density scan. This is done to screen for osteoporosis. You may have this done starting at age 75 Mammogram. This may be done every 1-2 years. Talk to your health care provider about how often you should have regular mammograms.  Talk with your health care provider about your test results, treatment options,  and if necessary, the need for more tests. Vaccines Your health care provider may recommend certain vaccines, such as:  Influenza vaccine. This is recommended every year.  Tetanus, diphtheria, and acellular pertussis (Tdap, Td) vaccine. You may need a Td booster every 10 years.  Varicella vaccine. You may need this if  you have not been vaccinated.  Zoster vaccine. You may need this after age 79.  Measles, mumps, and rubella (MMR) vaccine. You may need at least one dose of MMR if you were born in 1957 or later. You may also need a second dose.  Pneumococcal 13-valent conjugate (PCV13) vaccine. One dose is recommended after age 50.  Pneumococcal polysaccharide (PPSV23) vaccine. One dose is recommended after age 65.  Meningococcal vaccine. You may need this if you have certain conditions.  Hepatitis A vaccine. You may need this if you have certain conditions or if you travel or work in places where you may be exposed to hepatitis A.  Hepatitis B vaccine. You may need this if you have certain conditions or if you travel or work in places where you may be exposed to hepatitis B.  Haemophilus influenzae type b (Hib) vaccine. You may need this if you have certain conditions.  Talk to your health care provider about which screenings and vaccines you need and how often you need them. This information is not intended to replace advice given to you by your health care provider. Make sure you discuss any questions you have with your health care provider. Document Released: 11/15/2015 Document Revised: 07/08/2016 Document Reviewed: 08/20/2015 Elsevier Interactive Patient Education  2017 [employer].

## 2017-10-01 NOTE — Progress Notes (Signed)
Patient ID: YOUSRA Holt, female    DOB: [birth year], 76 y.o.   MRN: [id number]  PCP: Nicole Mons, PA-C  Chief Complaint  Patient presents with  . Annual Exam  . Medication Refill    Valium 5 MG    Subjective:   Presents for Annual Exam. Had her Medicare Wellness Visit 09/27/2017. Labs were updated at that time. Seasonal influenza vaccine offered. She declined, relating that she had never taken it and never would, because she doesn't trust the government  Cervical Cancer Screening: no longer a candidate Breast Cancer Screening: mammogram 06/2017. Repeat in 1 year, due to abnormal findings, thought likely benign. Colorectal Cancer Screening: normal colonoscopy 2012. Repeat 2022? Bone Density Testing: 12/09/2016. Osteopenia. Calcium and vitamin D. Rpeat 12/2018. HIV Screening: not a candidate for screening. Very low risk. STI Screening: very low risk. Seasonal Influenza Vaccination: declined Td/Tdap Vaccination: 11/2007. Repeat 2019. Pneumococcal Vaccination: series complete Zoster Vaccination: Zostavax 2016 Frequency of Dental evaluation: Q6 months, recent root canal and bridge. Having some  Frequency of Eye evaluation: annually    Review of Systems Constitutional: Negative for chills, fatigue and fever.  HENT: Positive for dental problem, postnasal drip, rhinorrhea and tinnitus. (sinus symptoms x 8 months. Phlegm sometimes chokes her. Uses her husband's nasal spray with relief, but doesn't know what it is.) Negative for congestion, ear pain, sinus pressure, sinus pain and sore throat.   Eyes: Negative for pain and discharge.  Respiratory: Positive for cough. Negative for shortness of breath.   Cardiovascular: Negative for chest pain and palpitations.  Gastrointestinal: Positive for constipation (uses warm sitz baths, helpful). Negative for abdominal pain, diarrhea, nausea and vomiting.  Genitourinary: Negative for difficulty urinating.  Musculoskeletal: Negative for  arthralgias.  Skin: Negative for rash.  Neurological: Negative for dizziness and headaches.  Psychiatric/Behavioral: The patient is nervous/anxious.        Patient Active Problem List   Diagnosis Date Noted  . 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 Abnormal mammogram of right breast 09/22/2016  . Rosacea 09/18/2015  . S/P cataract surgery 09/18/2015  . Obesity 03/12/2015  . OSA on CPAP 02/02/2014  . Obesity, unspecified 02/02/2014  . Tremor, essential 02/02/2014  . UARS (upper airway resistance syndrome) 02/02/2014  . Osteoporosis 07/28/2012  . Glaucoma 07/28/2012  . Restless leg syndrome 07/28/2012  . Anxiety 07/28/2012  . Benign positional vertigo 07/28/2012  . BMI 28.0-28.9,adult 07/27/2012  . Tinnitus 07/27/2012  . Hearing loss 07/27/2012  . Basal cell cancer 07/27/2012     Prior to Admission medications   Medication Sig Start Date End Date Taking? Authorizing Provider  diazepam (VALIUM) 5 MG tablet TAKE ONE TABLET BY MOUTH EVERY 6 HOURS AS NEEDED FOR ANXIETY 08/05/17  Yes Peace Jost, PA-C  Travoprost, BAK Free, (TRAVATAN) 0.004 % SOLN ophthalmic solution Place 1 drop into both eyes at bedtime.   Yes [provider]     Allergies  Allergen Reactions  . Codeine Nausea Only  . Estrogens Conjugated Other (See Comments)    HA  . Paroxetine Hcl Other (See Comments)    Felt like she was "out of her head"       Objective:  Physical Exam  Constitutional: She is oriented to person, place, and time. She appears well-developed and well-nourished. She is active and cooperative. No distress.  BP 130/78 (BP Location: Right Arm, Patient Position: Sitting, Cuff Size: Normal)   Pulse 70   Temp 98.6 F (37 C) (Oral)   Resp 18   Ht  5\' 3"  (1.6 m)   Wt 170 lb 3.2 oz (77.2 kg)   SpO2 95%   BMI 30.15 kg/m   HENT:  Head: Normocephalic and atraumatic.  Right Ear: Hearing and external ear normal.  Left Ear: Hearing and external ear normal.  Nose: Nose normal.    Mouth/Throat: Uvula is midline, oropharynx is clear and moist and mucous membranes are normal.    Eyes: Conjunctivae are normal. No scleral icterus.  Neck: Normal range of motion. Neck supple. No thyromegaly present.  Cardiovascular: Normal rate, regular rhythm and normal heart sounds.  Pulses:      Radial pulses are 2+ on the right side, and 2+ on the left side.  Pulmonary/Chest: Effort normal and breath sounds normal. Right breast exhibits no inverted nipple, no mass, no nipple discharge, no skin change and no tenderness. Left breast exhibits no inverted nipple, no mass, no nipple discharge, no skin change and no tenderness. Breasts are symmetrical.  Abdominal: Soft. Bowel sounds are normal. There is no hepatosplenomegaly. There is no tenderness. There is no CVA tenderness.  Lymphadenopathy:       Head (right side): No tonsillar, no preauricular, no posterior auricular and no occipital adenopathy present.       Head (left side): No tonsillar, no preauricular, no posterior auricular and no occipital adenopathy present.    She has no cervical adenopathy.       Right: No supraclavicular adenopathy present.       Left: No supraclavicular adenopathy present.  Neurological: She is alert and oriented to person, place, and time. No sensory deficit.  Skin: Skin is warm, dry and intact. No rash noted. No cyanosis or erythema. Nails show no clubbing.  Psychiatric: She has a normal mood and affect. Her speech is normal and behavior is normal.           Assessment & Plan:   Problem List Items Addressed This Visit    Osteoporosis    Osteopenia by DEXA 12/2016. Calcium 1500 mg daily (add Magnesium 250-500 as needed if increases constipation) and Vitamin D 1000-2000 IU daily. Repeat DEXA 12/2018.      Anxiety    Stable. Continue PRN diazepam.      Relevant Medications   diazepam (VALIUM) 5 MG tablet   BMI 30.0-30.9,adult    Encouraged increased physical activity and healthy eating choices.  She is interested in additional support. Referral to medical weight management.      Relevant Orders   Amb Ref to Medical Weight Management   Hypertriglyceridemia    Much improved with addition of OTC fish oil supplement. Healthy eating, increase exercise.       Other Visit Diagnoses    Annual physical exam    -  Primary   Age appropriate health guidance provided.   Sinus drainage       Trial of Flonase. If ineffective, let me know what her husband uses.   Relevant Medications   fluticasone (FLONASE) 50 MCG/ACT nasal spray   Mouth pain       Abnormality not appreciated. Encouraged her to contact her dental specialist.       No Follow-up on file.   Fara Chute, PA-C [facility]

## 2017-10-01 NOTE — Assessment & Plan Note (Signed)
Much improved with addition of OTC fish oil supplement. Healthy eating, increase exercise.

## 2017-10-01 NOTE — Assessment & Plan Note (Signed)
Stable. Continue PRN diazepam.

## 2017-10-19 DIAGNOSIS — H2511 Age-related nuclear cataract, right eye: Secondary | ICD-10-CM | POA: Diagnosis not present

## 2017-10-19 DIAGNOSIS — H401131 Primary open-angle glaucoma, bilateral, mild stage: Secondary | ICD-10-CM | POA: Diagnosis not present

## 2017-12-19 DIAGNOSIS — G4733 Obstructive sleep apnea (adult) (pediatric): Secondary | ICD-10-CM | POA: Diagnosis not present

## 2018-01-19 ENCOUNTER — Ambulatory Visit (INDEPENDENT_AMBULATORY_CARE_PROVIDER_SITE_OTHER): Payer: Medicare HMO | Admitting: Physician Assistant

## 2018-01-19 ENCOUNTER — Encounter: Payer: Self-pay | Admitting: Physician Assistant

## 2018-01-19 ENCOUNTER — Other Ambulatory Visit: Payer: Self-pay

## 2018-01-19 VITALS — BP 114/66 | HR 79 | Temp 98.7°F | Resp 18 | Ht 63.0 in | Wt 171.8 lb

## 2018-01-19 DIAGNOSIS — R05 Cough: Secondary | ICD-10-CM | POA: Diagnosis not present

## 2018-01-19 DIAGNOSIS — K219 Gastro-esophageal reflux disease without esophagitis: Secondary | ICD-10-CM

## 2018-01-19 DIAGNOSIS — R053 Chronic cough: Secondary | ICD-10-CM

## 2018-01-19 MED ORDER — HYDROCODONE-HOMATROPINE 5-1.5 MG/5ML PO SYRP: 2.5000 mL | ORAL_SOLUTION | Freq: Every day | ORAL | 0 refills | Status: AC

## 2018-01-19 NOTE — Progress Notes (Signed)
01/19/2018 10:57 AM   DOB: 1940-11-22 / MRN: [id number]  SUBJECTIVE:  Nicole Holt is a 77 y.o. female presenting for acute on chronic cough.  She tells me she has GERD which has been worsening over the last 2-3 months seems to be related to what she eats.  She feels that she can control this with diet.  She is tried Tums and this seems to relieve it.  She like some cough syrup to help her rest at night.  She is allergic to codeine; estrogens conjugated; and paroxetine hcl.   She  has a past medical history of Allergy, Anxiety, Cataract, Depression, Glaucoma, Glaucoma, Hearing loss, History of basal cell cancer (08/19/2012), Migraines, OSA on CPAP (02/02/2014), Osteoporosis, Sleep apnea, and Tinnitus.    She  reports that she has quit smoking. she has never used smokeless tobacco. She reports that she drinks about 7.2 oz of alcohol per week. She reports that she does not use drugs. She  reports that she currently engages in sexual activity. The patient  has a past surgical history that includes Cosmetic surgery and cataract surgery.  Her family history includes Arthritis/Rheumatoid in her unknown relative; Congestive Heart Failure in her father; Diabetes in her brother; Heart disease in her father, maternal grandmother, and unknown relative; Hyperlipidemia in her father and mother; Hypertension in her brother and mother; Kidney disease in her father; Osteoporosis in her mother; Parkinson's disease in her sister and unknown relative; Polymyalgia rheumatica in her sister; Stroke in her maternal grandfather, mother, paternal grandmother, and sister.  Review of Systems  Constitutional: Negative for chills, diaphoresis and fever.  HENT: Positive for congestion. Negative for sinus pain and sore throat.   Eyes: Negative.   Respiratory: Positive for cough and sputum production. Negative for hemoptysis, shortness of breath and wheezing.   Cardiovascular: Negative for chest pain, orthopnea and leg  swelling.  Gastrointestinal: Negative for nausea.  Skin: Negative for rash.  Neurological: Negative for dizziness, sensory change, speech change, focal weakness and headaches.    The problem list and medications were reviewed and updated by myself where necessary and exist elsewhere in the encounter.   OBJECTIVE:  BP 114/66 (BP Location: Left Arm, Patient Position: Sitting, Cuff Size: Large)   Pulse 79   Temp 98.7 F (37.1 C) (Oral)   Resp 18   Ht 5\' 3"  (1.6 m)   Wt 171 lb 12.8 oz (77.9 kg)   SpO2 95%   BMI 30.43 kg/m   Wt Readings from Last 3 Encounters:  01/19/18 171 lb 12.8 oz (77.9 kg)  10/01/17 170 lb 3.2 oz (77.2 kg)  09/27/17 168 lb 6 oz (76.4 kg)     Physical Exam  Constitutional: She is active.  Non-toxic appearance.  HENT:  Right Ear: Hearing, tympanic membrane, external ear and ear canal normal.  Left Ear: Hearing, tympanic membrane, external ear and ear canal normal.  Nose: Nose normal. Right sinus exhibits no maxillary sinus tenderness and no frontal sinus tenderness. Left sinus exhibits no maxillary sinus tenderness and no frontal sinus tenderness.  Mouth/Throat: Uvula is midline, oropharynx is clear and moist and mucous membranes are normal. Mucous membranes are not dry. No oropharyngeal exudate, posterior oropharyngeal edema or tonsillar abscesses.  Cardiovascular: Normal rate, regular rhythm, S1 normal, S2 normal, normal heart sounds and intact distal pulses. Exam reveals no gallop, no friction rub and no decreased pulses.  No murmur heard. Pulmonary/Chest: Effort normal. No stridor. No tachypnea. No respiratory distress. She has no  wheezes. She has no rales.  Abdominal: She exhibits no distension.  Musculoskeletal: She exhibits no edema.  Lymphadenopathy:       Head (right side): No submandibular and no tonsillar adenopathy present.       Head (left side): No submandibular and no tonsillar adenopathy present.    She has no cervical adenopathy.    Neurological: She is alert.  Skin: Skin is warm and dry. She is not diaphoretic. No pallor.    No results found for this or any previous visit (from the past 72 hour(s)).  No results found.  ASSESSMENT AND PLAN:  Crosby was seen today for cough.  Diagnoses and all orders for this visit:  Chronic coughing patient here for acute on chronic cough.  Her nose is congested however she has very few other symptoms.  She is very wary of taking pills.  She does have a history of worsening GERD over the last 8 months.  I have advised that we try some Zantac 75 mg at night as this may be the cause of her cough however she refuses this.  I will give her 30 mL's of cough syrup and advising on her AVS that if this fails to work she will need to try to treat her heartburn.  She is not losing weight.  She can come back and see myself or Chelle for this. -     HYDROcodone-homatropine (HYCODAN) 5-1.5 MG/5ML syrup; Take 2.5-5 mLs by mouth at bedtime for 5 days. Do not mix with Valium.  Always wear your CPAP if you are going to be taking this medication to sleep.  Gastroesophageal reflux disease, esophagitis presence not specified: Likely the driving etiology for chronic cough.   The patient is advised to call or return to clinic if she does not see an improvement in symptoms, or to seek the care of the closest emergency department if she worsens with the above plan.   Philis Fendt, MHS, PA-C [facility] 01/19/2018 10:57 AM

## 2018-01-19 NOTE — Patient Instructions (Addendum)
If your cough does not go away with the cough syrup, time and please go purchase some Zantac 75 mg  and take at night to treat for GERD etiology.   We recommend that you schedule a mammogram for breast cancer screening. Typically, you do not need a referral to do this. Please contact a local imaging center to schedule your mammogram.  [facility] - [phone]  *ask for the Radiology Department [facility] ([place]) - [phone] or [phone]  [facility] - [phone] [place] [phone] [facility] - [phone]  *ask for the [facility] - [phone]  *ask for the Radiology Department [facility] - [phone]  *ask for the [place] - [phone]

## 2018-02-07 ENCOUNTER — Encounter: Payer: Self-pay | Admitting: Physician Assistant

## 2018-02-16 DIAGNOSIS — L718 Other rosacea: Secondary | ICD-10-CM | POA: Diagnosis not present

## 2018-02-16 DIAGNOSIS — L814 Other melanin hyperpigmentation: Secondary | ICD-10-CM | POA: Diagnosis not present

## 2018-02-16 DIAGNOSIS — Z85828 Personal history of other malignant neoplasm of skin: Secondary | ICD-10-CM | POA: Diagnosis not present

## 2018-02-16 DIAGNOSIS — L821 Other seborrheic keratosis: Secondary | ICD-10-CM | POA: Diagnosis not present

## 2018-02-16 DIAGNOSIS — D2221 Melanocytic nevi of right ear and external auricular canal: Secondary | ICD-10-CM | POA: Diagnosis not present

## 2018-02-16 DIAGNOSIS — L438 Other lichen planus: Secondary | ICD-10-CM | POA: Diagnosis not present

## 2018-02-16 DIAGNOSIS — L738 Other specified follicular disorders: Secondary | ICD-10-CM | POA: Diagnosis not present

## 2018-03-13 ENCOUNTER — Encounter: Payer: Self-pay | Admitting: Neurology

## 2018-03-14 ENCOUNTER — Ambulatory Visit: Payer: Medicare HMO | Admitting: Neurology

## 2018-03-14 ENCOUNTER — Encounter: Payer: Self-pay | Admitting: Neurology

## 2018-03-14 VITALS — BP 130/81 | HR 72 | Ht 63.0 in | Wt 171.0 lb

## 2018-03-14 DIAGNOSIS — R0982 Postnasal drip: Secondary | ICD-10-CM

## 2018-03-14 DIAGNOSIS — R0689 Other abnormalities of breathing: Secondary | ICD-10-CM | POA: Diagnosis not present

## 2018-03-14 DIAGNOSIS — G478 Other sleep disorders: Secondary | ICD-10-CM | POA: Diagnosis not present

## 2018-03-14 DIAGNOSIS — G4733 Obstructive sleep apnea (adult) (pediatric): Secondary | ICD-10-CM | POA: Diagnosis not present

## 2018-03-14 NOTE — Progress Notes (Signed)
[facility]  Provider:  Larey Seat, M D  Referring Provider: Harrison Mons, PA-C Primary Care Physician:  Harrison Mons, PA-C  Chief Complaint  Patient presents with  . Follow-up    pt alone, rm 11. pt states that she feels it has been 5 years and she is questioning about getting a new machine. no problems wih current machine, DME [employer],     HPI:   Nicole Holt is a 77 y.o. female seen here as a revisit on 03-14-2018 She was originally sent by PA  Jacqulynn Cadet for OSA work up, now followed for CPAP compliance.  I have not seen Nicole Holt since 2017 and in the meantime she has followed with our nurse practitioner here for regular compliance checks.  Today she endorsed the fatigue severity score of 12 points, the Epworth sleepiness score is 3 points, the geriatric depression score at 0 points.  Her C-Flex machine has been followed by Hue[facility] Romans And she received her machine  5.5 years ago and could get a new one. Has one nocturia at 3 AM. Sleeps 6-7 hours at night. No dry mouth nor headaches. No choking or snoring.   This CPAP still works, she has been highly compliant, 100% with a average use of time on all days of 8 hours and 50 minutes nightly, she has a residual AHI of only 0.6 with a CPAP setting of only 5 cm water.  This patient had a very mild apnea and mainly upper airway resistance syndrome which is treated with low pressure CPAP.  She never required oxygen, and she has been feeling less sleepy and less fatigued, clearly stating a benefit of CPAP therapy. She has still sinus post nasal drip.     Nicole Holt originally presented upon referral of Dr. Laney Pastor in 2013 . She is a right handed , chronically hoarse ,caucasian female patient , who  had reportedly loudly snoring and often woke herself from her snoring. She also woke up with a dry mouth and felt not  rested or restored in the mornings.  She gained weight , became less active.  Prior to her sleep evaluation  she endorsed getting 6-7 hours of nocturnal sleep. She also has suffered from allergic rhinitis and used an Teaching laboratory technician. Her grandchildren had reportedly been bothered by her loud snoring , which made her seek medical advice. She endorsed the Epworth score at 14 of 21 points at the time ( 2013) . The patient's sleep study in November 2013 documented AHI of 9.2 and RDI of 18, not SPLIT -  but she was hypoxic for over one hour at night.  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  She returned for a CPAP auto- titration and seemed to respond best to only 5 cm water pressure. 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 As she started on CPAP later that year , her rhinitis improved and she had no further sinusitis exacerbations.  She goes to bed at 11 Pm and falls asleep within 5 minutes, has a bathroom break at 2.30 AM and rarely another at 4.30. Unless she has a lot on her mind, she will easily fall asleep again.  Total sleep time is now 7-8 hours nightly, no daytime naps.  Her  Epworth sleepiness score had been reduced after she initiated CPAP he was and is currently at 6 points her fatigue severity score it on the 18 points , both greatly reduced. . Her geriatric depression score was at 3 points,  not indicative of depression.  Unable today to review the patient's compliance record she has 100% compliance on 180 date download, dated 02-02-2014. The nightly use of 7 hours 55 minutes with CPAP her residual AHI is 0.9 at that but CPAP pressure of 5 cm water. She loves her CPAP ! No neck , face or ENT surgery history .   Interval history since the patient underwent a CPAP titration on 09-27-12 as been followed yearly in our office.  Today is 03-12-15, the patient is currently under  [facility] coverage and uses the only available DME, preop. She has a CPAP machine by [employer] system 1 with heated humidifier,  coiled air tubing and uses a S-M size nasal mask. She has still other size liners, but only this one fits.  She needs to reorder this size through upper area. Her compliance information is available today downloaded in office on with a date 03-11-15 she's 100% compliant forward 30 out of 30 days of use 100% for over 4 hours of daily use with an average user time of 8 hours and 17 minutes. Her average AHI is 0.8 and a CPAP pressure of only 5 cm water. Her spepech is non fluent at the beginning of today's visit, but improved. She looks for words.   Review of Systems: Out of a complete 14 system review, the patient complains of only the following symptoms, and all other reviewed systems are negative.   Her 72 year old sister suffers from Parkinson's disease.   Today she endorsed the fatigue severity score of 12 points,  the Epworth sleepiness score is 3 points,  the geriatric depression score at 0 points.   Her C-Flex machine has been followed by Huey Romans    Social History   Socioeconomic History  . Marital status: Married    Spouse name: Legrand Como  . Number of children: 2  . Years of education: 50  . Highest education level: Some college, no degree  Occupational History  . Occupation: Retired  Scientific laboratory technician  . Financial resource strain: Not hard at all  . Food insecurity:    Worry: Never true    Inability: Never true  . Transportation needs:    Medical: No    Non-medical: No  Tobacco Use  . Smoking status: Former Research scientist (life sciences)  . Smokeless tobacco: Never Used  . Tobacco comment: Quit in 1987  Substance and Sexual Activity  . Alcohol use: Yes    Alcohol/week: 7.2 oz    Types: 12 Glasses of wine per week    Comment: wine or alcohol daily  . Drug use: No  . Sexual activity: Yes  Lifestyle  . Physical activity:    Days per week: 2 days     Minutes per session: 120 min  . Stress: Very much  Relationships  . Social connections:    Talks on phone: More than three times a week    Gets together: Twice a week    Attends religious service: Never    Active member of club or organization: Yes    Attends meetings of clubs or organizations: More than 4 times per year    Relationship status: Married  . Intimate partner violence:    Fear of current or ex partner: No  Emotionally abused: No    Physically abused: No    Forced sexual activity: No  Other Topics Concern  . Not on file  Social History Narrative   Patient is married Legrand Como) and lives with her husband.   Patient is retired.   Patient has two children.   Patient is right-handed.   Patient has a college education.   Patient drinks two cups of coffee daily.             Family History  Problem Relation Age of Onset  . Osteoporosis Mother   . Stroke Mother   . Hyperlipidemia Mother   . Hypertension Mother   . Kidney disease Father   . Congestive Heart Failure Father   . Heart disease Father   . Hyperlipidemia Father   . Stroke Sister   . Parkinson's disease Sister   . Heart disease Maternal Grandmother   . Stroke Maternal Grandfather   . Stroke Paternal Grandmother   . Hypertension Brother   . Diabetes Brother   . Arthritis/Rheumatoid Unknown   . Parkinson's disease Unknown   . Heart disease Unknown   . Polymyalgia rheumatica Sister     Past Medical History:  Diagnosis Date  . Allergy   . Anxiety    panic attack  . Cataract   . Depression   . Glaucoma   . Glaucoma   . Hearing loss   . 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 Migraines    ocular  . OSA on CPAP 02/02/2014   PSG 09-27-2012 RDI 18.9 and AHi 9.8, Epworth 15 .titrated to only 5 cm water.   . Osteoporosis   . Sleep apnea   . Tinnitus     Past Surgical History:  Procedure Laterality Date  . cataract surgery    . COSMETIC SURGERY      Current  Outpatient Medications  Medication Sig Dispense Refill  . diazepam (VALIUM) 5 MG tablet Take 1 tablet (5 mg total) by mouth every 6 (six) hours as needed. for anxiety 30 tablet 0  . fluticasone (FLONASE) 50 MCG/ACT nasal spray Place 2 sprays into both nostrils daily. 16 g 12  . Travoprost, BAK Free, (TRAVATAN) 0.004 % SOLN ophthalmic solution Place 1 drop into both eyes at bedtime.     No current facility-administered medications for this visit.     Allergies as of 03/14/2018 - Review Complete 03/14/2018  Allergen Reaction Noted  . Codeine Nausea Only 01/18/2012  . Estrogens conjugated Other (See Comments) 01/18/2012  . Paroxetine hcl Other (See Comments) 01/18/2012    Vitals: BP 130/81   Pulse 72   Ht 5\' 3"  (1.6 m)   Wt 171 lb (77.6 kg)   BMI 30.29 kg/m  Last Weight:  Wt Readings from Last 1 Encounters:  03/14/18 171 lb (77.6 kg)   Last Height:   Ht Readings from Last 1 Encounters:  03/14/18 5\' 3"  (1.6 m)    Physical exam:  General: The patient is awake, alert and appears not in acute distress. The patient is well groomed. Has dysphonia.  Head: Normocephalic, atraumatic.  Neck is supple. Mallampati 2 , neck circumference:15.0, has still mild  tongue tremor,  jaw tremor, tremulous speech.  Cardiovascular:  Regular rate and rhythm, without murmurs or carotid bruit, and without distended neck veins. Respiratory: Lungs are clear to auscultation.Skin: tanned,  Without evidence of edema, or rash. Facial rosacea.  Trunk: BMI is elevated. This patient has normal posture.  Neurologic exam : The patient is awake and alert, oriented to place and time.  There is a normal attention span & concentration ability.  Speech is non- fluent, she struggles today with fluency,  is mid-sentence lost for words.  Mood and affect are outgoing, but volatile. She feels easily in tears. She is easily choked up.  Cranial nerves: Pupils are reactive to light. The patient just underwent a left-sided  lensectomy.  She does have a slightly disrounded left pupil - wears no longer corrective lenses which allows her to not wear glasses. Funduscopic exam without evidence of pallor or edema.  Extraocular movements  in vertical and horizontal planes intact and without nystagmus.  Visual fields by finger perimetry are intact. Hearing corrected by hearing aids bilaterally- she speaks loudly.  Facial motor strength is symmetric. Motor exam:  Normal tone, muscle bulk and symmetric normal strength in all extremities. Sensory:  Fine touch, pinprick and vibration were normal. Coordination: . Finger-to-nose maneuver tested and with a mild  tremor.  Gait and station: Patient walks without assistive device. Strength within normal limits.  Deep tendon reflexes: in the  upper and lower extremities are symmetric and intact. Babinski maneuver response is  downgoing.   Assessment:  After physical and neurologic examination, review of laboratory studies, imaging, neurophysiology testing and pre-existing records, assessment is   1) OSA with UARS.  This condition is well controlled on the current CPAP setting of 5 cm.  100% compliance, well controlled AHI under 1 ! .  Humidifier can be self adjusted.   2) weight has been stabile, lost a few pounds.    3) some delay in word finding - delayed , not a "blank"     Plan:  Treatment plan and additional workup : Continue CPAP at 5 cm water, for UARS and  mild OSA,.   RV in 12 month with CPAP machine , MOCA please.  Patient declined speech therapy .   Larey Seat, MD   03-14-2018   Medical Director of [facility] at [facility],  Fellow of the AASM, ABPN and ABSM.

## 2018-03-21 DIAGNOSIS — G4733 Obstructive sleep apnea (adult) (pediatric): Secondary | ICD-10-CM | POA: Diagnosis not present

## 2018-03-30 ENCOUNTER — Encounter: Payer: Self-pay | Admitting: Family Medicine

## 2018-04-19 DIAGNOSIS — H401131 Primary open-angle glaucoma, bilateral, mild stage: Secondary | ICD-10-CM | POA: Diagnosis not present

## 2018-04-19 DIAGNOSIS — H2511 Age-related nuclear cataract, right eye: Secondary | ICD-10-CM | POA: Diagnosis not present

## 2018-04-19 DIAGNOSIS — H43813 Vitreous degeneration, bilateral: Secondary | ICD-10-CM | POA: Diagnosis not present

## 2018-05-24 ENCOUNTER — Ambulatory Visit: Payer: Medicare HMO | Admitting: Family Medicine

## 2018-05-24 ENCOUNTER — Encounter: Payer: Self-pay | Admitting: Family Medicine

## 2018-05-24 VITALS — BP 122/78 | HR 64 | Temp 98.4°F | Ht 63.0 in | Wt 170.8 lb

## 2018-05-24 DIAGNOSIS — F419 Anxiety disorder, unspecified: Secondary | ICD-10-CM

## 2018-05-24 DIAGNOSIS — E785 Hyperlipidemia, unspecified: Secondary | ICD-10-CM | POA: Insufficient documentation

## 2018-05-24 DIAGNOSIS — W57XXXA Bitten or stung by nonvenomous insect and other nonvenomous arthropods, initial encounter: Secondary | ICD-10-CM

## 2018-05-24 DIAGNOSIS — S40862A Insect bite (nonvenomous) of left upper arm, initial encounter: Secondary | ICD-10-CM

## 2018-05-24 DIAGNOSIS — S80862A Insect bite (nonvenomous), left lower leg, initial encounter: Secondary | ICD-10-CM

## 2018-05-24 DIAGNOSIS — S40861A Insect bite (nonvenomous) of right upper arm, initial encounter: Secondary | ICD-10-CM

## 2018-05-24 DIAGNOSIS — S80861A Insect bite (nonvenomous), right lower leg, initial encounter: Secondary | ICD-10-CM

## 2018-05-24 MED ORDER — DIAZEPAM 5 MG PO TABS: 5.0000 mg | ORAL_TABLET | Freq: Four times a day (QID) | ORAL | 0 refills | Status: DC | PRN

## 2018-05-24 NOTE — Progress Notes (Signed)
Subjective:  Nicole Holt is a 77 y.o. female who presents today with a chief complaint of insect bites and to establish care  HPI:  Insect bites, new problem Symptoms started several weeks to months ago.  Located on lower extremities and arms bilaterally.  Has several small, dark spots that are itchy.  Has tried over-the-counter medications including hydrocortisone which have helped a little bit.  Symptoms seem to be stable.  No reported fevers or chills.  No other obvious alleviating or aggravating factors.  Dyslipidemia, chronic problem, new to provider Several year history.  Was told she had high triglyceride levels.  She has been making lifestyle modifications and been taking fish oil.  Goes to the gym and walks on a treadmill 4 times a week.  Anxiety, chronic problem, new to provider Severe history.  Very sparingly takes Valium 5 mg daily as needed for severe anxiety and panic attacks.  Symptoms seem to be well controlled.  ROS: Per HPI, otherwise a complete review of systems was negative.   PMH:  The following were reviewed and entered/updated in epic: Past Medical History:  Diagnosis Date  . Allergy   . Anxiety    panic attack  . Cataract   . Depression   . GERD (gastroesophageal reflux disease)   . Glaucoma   . Glaucoma   . Hearing loss   . 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 Hyperlipidemia   . Migraines    ocular  . OSA on CPAP 02/02/2014   PSG 09-27-2012 RDI 18.9 and AHi 9.8, Epworth 15 .titrated to only 5 cm water.   . Osteoporosis   . Sleep apnea   . Tinnitus    Patient Active Problem List   Diagnosis Date Noted  . Dyslipidemia 05/24/2018  . Abnormal mammogram of right breast 09/22/2016  . Rosacea 09/18/2015  . S/P cataract surgery 09/18/2015  . BMI 30.0-30.9,adult 03/12/2015  . OSA on CPAP 02/02/2014  . Tremor, essential 02/02/2014  . UARS (upper airway resistance syndrome) 02/02/2014  . Osteoporosis 07/28/2012  .  Glaucoma 07/28/2012  . Restless leg syndrome 07/28/2012  . Anxiety 07/28/2012  . Benign positional vertigo 07/28/2012  . Tinnitus 07/27/2012  . Hearing loss 07/27/2012  . Basal cell cancer 07/27/2012   Past Surgical History:  Procedure Laterality Date  . cataract surgery    . COSMETIC SURGERY      Family History  Problem Relation Age of Onset  . Osteoporosis Mother   . Stroke Mother   . Hyperlipidemia Mother   . Hypertension Mother   . Kidney disease Father   . Congestive Heart Failure Father   . Heart disease Father   . Hyperlipidemia Father   . Stroke Sister   . Parkinson's disease Sister   . Heart disease Maternal Grandmother   . Stroke Maternal Grandfather   . Stroke Paternal Grandmother   . Hypertension Brother   . Diabetes Brother   . Arthritis/Rheumatoid Unknown   . Parkinson's disease Unknown   . Heart disease Unknown   . Polymyalgia rheumatica Sister     Medications- reviewed and updated Current Outpatient Medications  Medication Sig Dispense Refill  . calcium-vitamin D (OSCAL WITH D) 500-200 MG-UNIT tablet Take 2 tablets by mouth daily with breakfast.    . diazepam (VALIUM) 5 MG tablet Take 1 tablet (5 mg total) by mouth every 6 (six) hours as needed. for anxiety 30 tablet 0  . fluticasone (FLONASE) 50 MCG/ACT nasal  spray Place 2 sprays into both nostrils daily. 16 g 12  . Multiple Vitamins-Minerals (CENTRUM ADULTS PO) Take by mouth.    . Omega-3 Fatty Acids (FISH OIL PO) Take by mouth.    . Travoprost, BAK Free, (TRAVATAN) 0.004 % SOLN ophthalmic solution Place 1 drop into both eyes at bedtime.     No current facility-administered medications for this visit.     Allergies-reviewed and updated Allergies  Allergen Reactions  . Codeine Nausea Only  . Estrogens Conjugated Other (See Comments)    HA  . Paroxetine Hcl Other (See Comments)    Felt like she was "out of her head"    Social History   Socioeconomic History  . Marital status: Married     Spouse name: Legrand Como  . Number of children: 2  . Years of education: 53  . Highest education level: Some college, no degree  Occupational History  . Occupation: Retired  Scientific laboratory technician  . Financial resource strain: Not hard at all  . Food insecurity:    Worry: Never true    Inability: Never true  . Transportation needs:    Medical: No    Non-medical: No  Tobacco Use  . Smoking status: Former Research scientist (life sciences)  . Smokeless tobacco: Never Used  . Tobacco comment: Quit in 1987  Substance and Sexual Activity  . Alcohol use: Yes    Alcohol/week: 7.2 oz    Types: 12 Glasses of wine per week    Comment: wine or alcohol daily  . Drug use: No  . Sexual activity: Yes  Lifestyle  . Physical activity:    Days per week: 2 days    Minutes per session: 120 min  . Stress: Very much  Relationships  . Social connections:    Talks on phone: More than three times a week    Gets together: Twice a week    Attends religious service: Never    Active member of club or organization: Yes    Attends meetings of clubs or organizations: More than 4 times per year    Relationship status: Married  Other Topics Concern  . Not on file  Social History Narrative   Patient is married Legrand Como) and lives with her husband.   Patient is retired.   Patient has two children.   Patient is right-handed.   Patient has a college education.   Patient drinks two cups of coffee daily.             Objective:  Physical Exam: BP 122/78 (BP Location: Left Arm, Patient Position: Sitting, Cuff Size: Normal)   Pulse 64   Temp 98.4 F (36.9 C) (Oral)   Ht 5\' 3"  (1.6 m)   Wt 170 lb 12.8 oz (77.5 kg)   SpO2 96%   BMI 30.26 kg/m   Gen: NAD, resting comfortably CV: RRR with no murmurs appreciated Pulm: NWOB, CTAB with no crackles, wheezes, or rhonchi GI: Normal bowel sounds present. Soft, Nontender, Nondistended. MSK: No edema, cyanosis, or clubbing noted Skin: Several small, discrete, 1 cm hyperpigmented lesions  scattered across lower extremities and upper extremities. Neuro: Grossly normal, moves all extremities Psych: Normal affect and thought content  Assessment/Plan:  Dyslipidemia Continue lifestyle modifications.  Follow-up in 4 months for lipid panel.  Anxiety Stable.  Continue low-dose Valium as needed.  Refill was given today.  Insect bites No red flag signs or symptoms.  Reassured patient.  She will continue using over-the-counter hydrocortisone cream as needed.  Preventative Healthcare Patient was  instructed to return soon for CPE.  Algis Greenhouse. Jerline Pain, MD 05/24/2018 11:24 AM

## 2018-05-24 NOTE — Assessment & Plan Note (Signed)
Continue lifestyle modifications.  Follow-up in 4 months for lipid panel.

## 2018-05-24 NOTE — Assessment & Plan Note (Signed)
Stable.  Continue low-dose Valium as needed.  Refill was given today.

## 2018-05-24 NOTE — Patient Instructions (Signed)
It was very nice to see you today!  You are very good job care of yourself! Please keep up the good work!  Please keep an eye on the spots in your legs.  I think they should follow-up normally.  Please let you know if the spots get worse or if you have any redness or drainage.  Come back to see me on or after 09/28/2018 for your physical with blood work.  You can also schedule an annual wellness visit.  Take care, Dr Jerline Pain

## 2018-07-27 ENCOUNTER — Other Ambulatory Visit: Payer: Self-pay | Admitting: Family Medicine

## 2018-07-27 DIAGNOSIS — N6489 Other specified disorders of breast: Secondary | ICD-10-CM

## 2018-07-29 ENCOUNTER — Telehealth: Payer: Self-pay | Admitting: *Deleted

## 2018-07-29 NOTE — Telephone Encounter (Signed)
I have not received anything from [facility] as of yet.

## 2018-07-29 NOTE — Telephone Encounter (Signed)
Copied from [place] [id number]. Topic: Referral - Status >> Jul 29, 2018 10:29 AM Reyne Dumas L wrote: Reason for CRM:   Cherish from the [facility] calling.  States they faxed over orders for pt to have a follow up diagnostic mammogram (appt scheduled for Monday) on 09/25 and 09/26.  They need these signed and sent back. Cherish can be reached at [phone]

## 2018-07-29 NOTE — Telephone Encounter (Signed)
Form has been signed and faxed. 

## 2018-07-29 NOTE — Telephone Encounter (Signed)
[place] with [facility] called and will be faxing again. She said if she doesn't hear back by 4pm, they will have to call the patient and cancel her appt for monday

## 2018-08-01 ENCOUNTER — Ambulatory Visit
Admission: RE | Admit: 2018-08-01 | Discharge: 2018-08-01 | Disposition: A | Discharge status: Home / Self Care | Payer: Medicare HMO | Source: Ambulatory Visit | Attending: Family Medicine | Admitting: Family Medicine

## 2018-08-01 DIAGNOSIS — N6489 Other specified disorders of breast: Secondary | ICD-10-CM

## 2018-08-08 DIAGNOSIS — G4733 Obstructive sleep apnea (adult) (pediatric): Secondary | ICD-10-CM | POA: Diagnosis not present

## 2018-09-20 DIAGNOSIS — H2511 Age-related nuclear cataract, right eye: Secondary | ICD-10-CM | POA: Diagnosis not present

## 2018-09-20 DIAGNOSIS — H04221 Epiphora due to insufficient drainage, right lacrimal gland: Secondary | ICD-10-CM | POA: Diagnosis not present

## 2018-09-20 DIAGNOSIS — H401131 Primary open-angle glaucoma, bilateral, mild stage: Secondary | ICD-10-CM | POA: Diagnosis not present

## 2018-09-20 DIAGNOSIS — H43813 Vitreous degeneration, bilateral: Secondary | ICD-10-CM | POA: Diagnosis not present

## 2018-09-26 ENCOUNTER — Telehealth: Payer: Self-pay | Admitting: Family Medicine

## 2018-09-26 NOTE — Telephone Encounter (Signed)
Patient requests a call to have lab work done on 10/14/18 for her annual wellness visit. Please call to advise as she is concerned about being a possible diabetic.

## 2018-09-28 ENCOUNTER — Ambulatory Visit: Payer: Medicare HMO

## 2018-09-28 NOTE — Telephone Encounter (Signed)
Spoke with patient and assured her that she could have her lab work done when she has her annual wellness visit.

## 2018-10-06 ENCOUNTER — Ambulatory Visit: Payer: Medicare HMO

## 2018-10-06 ENCOUNTER — Ambulatory Visit: Payer: Medicare HMO | Admitting: Family Medicine

## 2018-10-06 DIAGNOSIS — H04221 Epiphora due to insufficient drainage, right lacrimal gland: Secondary | ICD-10-CM | POA: Diagnosis not present

## 2018-10-11 ENCOUNTER — Ambulatory Visit: Payer: Medicare HMO | Admitting: Family Medicine

## 2018-10-14 ENCOUNTER — Encounter: Payer: Self-pay | Admitting: Family Medicine

## 2018-10-14 ENCOUNTER — Ambulatory Visit (INDEPENDENT_AMBULATORY_CARE_PROVIDER_SITE_OTHER): Payer: Medicare HMO | Admitting: Family Medicine

## 2018-10-14 ENCOUNTER — Ambulatory Visit (INDEPENDENT_AMBULATORY_CARE_PROVIDER_SITE_OTHER): Payer: Medicare HMO

## 2018-10-14 VITALS — BP 128/82 | HR 68 | Temp 97.7°F | Ht 63.0 in | Wt 175.4 lb

## 2018-10-14 VITALS — BP 128/82 | HR 68 | Temp 97.7°F | Ht 63.0 in | Wt 175.0 lb

## 2018-10-14 DIAGNOSIS — Z0000 Encounter for general adult medical examination without abnormal findings: Secondary | ICD-10-CM

## 2018-10-14 DIAGNOSIS — H9193 Unspecified hearing loss, bilateral: Secondary | ICD-10-CM | POA: Diagnosis not present

## 2018-10-14 DIAGNOSIS — K625 Hemorrhage of anus and rectum: Secondary | ICD-10-CM

## 2018-10-14 DIAGNOSIS — E785 Hyperlipidemia, unspecified: Secondary | ICD-10-CM

## 2018-10-14 DIAGNOSIS — K5900 Constipation, unspecified: Secondary | ICD-10-CM | POA: Diagnosis not present

## 2018-10-14 DIAGNOSIS — F419 Anxiety disorder, unspecified: Secondary | ICD-10-CM | POA: Diagnosis not present

## 2018-10-14 LAB — COMPREHENSIVE METABOLIC PANEL
ALT: 25 U/L (ref 0–35)
AST: 23 U/L (ref 0–37)
Albumin: 4.4 g/dL (ref 3.5–5.2)
Alkaline Phosphatase: 62 U/L (ref 39–117)
BUN: 15 mg/dL (ref 6–23)
CHLORIDE: 104 meq/L (ref 96–112)
CO2: 27 mEq/L (ref 19–32)
Calcium: 9.5 mg/dL (ref 8.4–10.5)
Creatinine, Ser: 0.85 mg/dL (ref 0.40–1.20)
GFR: 69.05 mL/min (ref >60.00)
Glucose, Bld: 105 mg/dL — ABNORMAL HIGH (ref 70–99)
Potassium: 4.6 mEq/L (ref 3.5–5.1)
Sodium: 140 mEq/L (ref 135–145)
Total Bilirubin: 0.6 mg/dL (ref 0.2–1.2)
Total Protein: 6.9 g/dL (ref 6.0–8.3)

## 2018-10-14 LAB — LIPID PANEL
CHOL/HDL RATIO: 4
Cholesterol: 203 mg/dL — ABNORMAL HIGH (ref 0–200)
HDL: 54.8 mg/dL (ref >39.00)
NONHDL: 148.55
Triglycerides: 207 mg/dL — ABNORMAL HIGH (ref 0.0–149.0)
VLDL: 41.4 mg/dL — ABNORMAL HIGH (ref 0.0–40.0)

## 2018-10-14 LAB — TSH: TSH: 1.33 u[IU]/mL (ref 0.35–4.50)

## 2018-10-14 LAB — CBC
HCT: 43.9 % (ref 36.0–46.0)
Hemoglobin: 14.9 g/dL (ref 12.0–15.0)
MCHC: 33.9 g/dL (ref 30.0–36.0)
MCV: 93.3 fl (ref 78.0–100.0)
Platelets: 270 10*3/uL (ref 150.0–400.0)
RBC: 4.7 Mil/uL (ref 3.87–5.11)
RDW: 13.6 % (ref 11.5–15.5)
WBC: 5.7 10*3/uL (ref 4.0–10.5)

## 2018-10-14 LAB — LDL CHOLESTEROL, DIRECT: Direct LDL: 116 mg/dL

## 2018-10-14 MED ORDER — DIAZEPAM 5 MG PO TABS: 5.0000 mg | ORAL_TABLET | Freq: Four times a day (QID) | ORAL | 0 refills | Status: DC | PRN

## 2018-10-14 NOTE — Patient Instructions (Signed)
Nicole Holt , Thank you for taking time to come for your Medicare Wellness Visit. I appreciate your ongoing commitment to your health goals. Please review the following plan we discussed and let me know if I can assist you in the future.   These are the goals we discussed: Goals    . Reduce alcohol intake     Patient states that she wants to try to decrease her alcohol intake daily.        This is a list of the screening recommended for you and due dates:  Health Maintenance  Topic Date Due  . Tetanus Vaccine  05/25/2019*  . Flu Shot  09/28/2027*  . DEXA scan (bone density measurement)  Completed  . Pneumonia vaccines  Completed  *Topic was postponed. The date shown is not the original due date.   Preventive Care for Adults  A healthy lifestyle and preventive care can promote health and wellness. Preventive health guidelines for adults include the following key practices.  . A routine yearly physical is a good way to check with your health care provider about your health and preventive screening. It is a chance to share any concerns and updates on your health and to receive a thorough exam.  . Visit your dentist for a routine exam and preventive care every 6 months. Brush your teeth twice a day and floss once a day. Good oral hygiene prevents tooth decay and gum disease.  . The frequency of eye exams is based on your age, health, family medical history, use  of contact lenses, and other factors. Follow your health care provider's recommendations for frequency of eye exams.  . Eat a healthy diet. Foods like vegetables, fruits, whole grains, low-fat dairy products, and lean protein foods contain the nutrients you need without too many calories. Decrease your intake of foods high in solid fats, added sugars, and salt. Eat the right amount of calories for you. Get information about a proper diet from your health care provider, if necessary.  . Regular physical exercise is one of the  most important things you can do for your health. Most adults should get at least 150 minutes of moderate-intensity exercise (any activity that increases your heart rate and causes you to sweat) each week. In addition, most adults need muscle-strengthening exercises on 2 or more days a week.  Silver Sneakers may be a benefit available to you. To determine eligibility, you may visit the website: www.silversneakers.com or contact program at 9172393581 Mon-Fri between 8AM-8PM.   . Maintain a healthy weight. The body mass index (BMI) is a screening tool to identify possible weight problems. It provides an estimate of body fat based on height and weight. Your health care provider can find your BMI and can help you achieve or maintain a healthy weight.   For adults 20 years and older: ? A BMI below 18.5 is considered underweight. ? A BMI of 18.5 to 24.9 is normal. ? A BMI of 25 to 29.9 is considered overweight. ? A BMI of 30 and above is considered obese.   . Maintain normal blood lipids and cholesterol levels by exercising and minimizing your intake of saturated fat. Eat a balanced diet with plenty of fruit and vegetables. Blood tests for lipids and cholesterol should begin at age 33 and be repeated every 5 years. If your lipid or cholesterol levels are high, you are over 50, or you are at high risk for heart disease, you may need your cholesterol levels checked  more frequently. Ongoing high lipid and cholesterol levels should be treated with medicines if diet and exercise are not working.  . If you smoke, find out from your health care provider how to quit. If you do not use tobacco, please do not start.  . If you choose to drink alcohol, please do not consume more than 2 drinks per day. One drink is considered to be 12 ounces (355 mL) of beer, 5 ounces (148 mL) of wine, or 1.5 ounces (44 mL) of liquor.  . If you are 75-34 years old, ask your health care provider if you should take aspirin to  prevent strokes.  . Use sunscreen. Apply sunscreen liberally and repeatedly throughout the day. You should seek shade when your shadow is shorter than you. Protect yourself by wearing long sleeves, pants, a wide-brimmed hat, and sunglasses year round, whenever you are outdoors.  . Once a month, do a whole body skin exam, using a mirror to look at the skin on your back. Tell your health care provider of new moles, moles that have irregular borders, moles that are larger than a pencil eraser, or moles that have changed in shape or color.

## 2018-10-14 NOTE — Assessment & Plan Note (Signed)
Check CBC, CMET, and TSH.  Recommended good oral hydration and fiber supplementation.  Recommended over-the-counter MiraLAX as needed to have at least 2-3 soft bowel movements daily.  Recommended KUB however patient declined.  Patient declined rectal exam as well.  Discussed reasons to return to care or seek emergent care.  Referral to GI placed.

## 2018-10-14 NOTE — Progress Notes (Signed)
   Subjective:  Nicole Holt is a 77 y.o. female who presents today with a chief complaint of constipation.   HPI:  Constipation, new problem Patient has had some difficulty with bowel movement for the past several months.  This worsened within the last couple of weeks.  She feels like she is having some issue moving stool.  Says that she occasionally has to "move the stool" with her finger have a bowel movement.  Has some hemorrhoids with occasional rectal bleeding as well.  Has had a colonoscopy in 2012 which was reportedly normal.  She has not tried any over-the-counter medications or other treatments.  Dental pain Started few weeks ago.  Has seen her dentist and was started on amoxicillin.  Pain is improved modestly since then.  She thinks that is due to ill fitting bridge and crown.   Anxiety, established problem, Stable Currently takes Valium 5 mg as needed.  She has not had to take this very often.  Symptoms of been worsened recently.  She usually comes with her anxiety by getting some alone time.  Depression screen PHQ 2/9 10/14/2018  Decreased Interest 1  Down, Depressed, Hopeless 2  PHQ - 2 Score 3  Altered sleeping 3  Tired, decreased energy 0  Change in appetite 0  Feeling bad or failure about yourself  0  Trouble concentrating 1  Moving slowly or fidgety/restless 0  Suicidal thoughts 0  PHQ-9 Score 7  Difficult doing work/chores Somewhat difficult   ROS: Per HPI  PMH: She reports that she has quit smoking. She has never used smokeless tobacco. She reports current alcohol use of about 12.0 standard drinks of alcohol per week. She reports that she does not use drugs.  Objective:  Physical Exam: BP 128/82 (BP Location: Left Arm, Patient Position: Sitting, Cuff Size: Normal)   Pulse 68   Temp 97.7 F (36.5 C) (Oral)   Ht 5\' 3"  (1.6 m)   Wt 175 lb (79.4 kg)   SpO2 95%   BMI 31.00 kg/m   Gen: NAD, resting comfortably HEENT: No apparent dental abscesses or other  lesions. CV: RRR with no murmurs appreciated Pulm: NWOB, CTAB with no crackles, wheezes, or rhonchi GI: Normal bowel sounds present. Soft, Nontender, Nondistended.  Assessment/Plan:  Rectal bleeding Likely secondary to hemorrhoids.  Vitals stable.  Check CBC.  Referral placed to GI.  Dyslipidemia Check lipid panel today.  Constipation Check CBC, CMET, and TSH.  Recommended good oral hydration and fiber supplementation.  Recommended over-the-counter MiraLAX as needed to have at least 2-3 soft bowel movements daily.  Recommended KUB however patient declined.  Patient declined rectal exam as well.  Discussed reasons to return to care or seek emergent care.  Referral to GI placed.  Anxiety Had a lengthy discussion with patient regarding treatment options.  She does not want to have psychotherapy at this time.  Discussed a few mindfulness techniques.  She will continue Valium 5 mg as needed.  This is refilled today.  Time Spent: I spent >40 minutes face-to-face with the patient, with more than half spent on counseling for management plan for constipation, dental pain, and anxiety.   Algis Greenhouse. Jerline Pain, MD 10/14/2018 1:04 PM

## 2018-10-14 NOTE — Progress Notes (Signed)
PCP notes: Last OV 05/24/2018   Health maintenance: Up to Date   Abnormal screenings: PHQ9 score of 7   Patient concerns: Needs a referral placed to [facility] hearing office for hearing testing to be updated. She needs to have her hearing aids updated-Referral placed  Having issues with constipation and rectal bleeding, right sided abdominal pain   Nurse concerns:See above   Next PCP appt: 10/14/2018

## 2018-10-14 NOTE — Progress Notes (Signed)
I have personally reviewed the Medicare Annual Wellness Visit and agree with the assessment and plan.  Algis Greenhouse. Jerline Pain, MD 10/14/2018 1:28 PM

## 2018-10-14 NOTE — Progress Notes (Signed)
Subjective:   Nicole Holt is a 77 y.o. female who presents for Medicare Annual (Subsequent) preventive examination.  Review of Systems:  No ROS.  Medicare Wellness Visit. Additional risk factors are reflected in the social history. Cardiac Risk Factors include: advanced age (>38men, >22 women) Patient lives in a 2 story  ith her husband of 38 years. They have 2 cats. Patient enjoys feeding the birds and her cats. Enjoys drinking coffee and reading. Working on Estate agent a book. Enjoys journaling.    Goes to bed around 10-12. Wears a CPAP at night. Gets up about 2 times a night to go to the bathroom. She gets up around 8:30. Feels more rested now than before she had the CPAP.    Objective:     Vitals: BP 128/82 (BP Location: Left Arm, Patient Position: Sitting, Cuff Size: Large)   Pulse 68   Temp 97.7 F (36.5 C) (Oral)   Ht 5\' 3"  (1.6 m)   Wt 175 lb 6.4 oz (79.6 kg)   SpO2 95%   BMI 31.07 kg/m   Body mass index is 31.07 kg/m.  Advanced Directives 10/14/2018 09/27/2017  Does Patient Have a Medical Advance Directive? Yes Yes  Type of Paramedic of [place];Living will [place];Living will  Does patient want to make changes to medical advance directive? No - Patient declined -  Copy of [place] in Chart? No - copy requested No - copy requested    Tobacco Social History   Tobacco Use  Smoking Status Former Smoker  Smokeless Tobacco Never Used  Tobacco Comment   Quit in 1987     Counseling given: Not Answered Comment: Quit in 1987    Past Medical History:  Diagnosis Date  . Allergy   . Anxiety    panic attack  . Cataract   . Depression   . GERD (gastroesophageal reflux disease)   . Glaucoma   . Glaucoma   . Hearing loss   . 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 Hyperlipidemia   . Migraines    ocular  . OSA on CPAP 02/02/2014   PSG 09-27-2012 RDI 18.9 and AHi 9.8, [place]  15 .titrated to only 5 cm water.   . Osteoporosis   . Sleep apnea   . Tinnitus    Past Surgical History:  Procedure Laterality Date  . cataract surgery    . COSMETIC SURGERY     Family History  Problem Relation Age of Onset  . Osteoporosis Mother   . Stroke Mother   . Hyperlipidemia Mother   . Hypertension Mother   . Kidney disease Father   . Congestive Heart Failure Father   . Heart disease Father   . Hyperlipidemia Father   . Stroke Sister   . Parkinson's disease Sister   . Heart disease Sister   . Heart disease Maternal Grandmother   . Gout Maternal Grandmother   . Stroke Maternal Grandfather   . Stroke Paternal Grandmother   . Thrombosis Paternal Grandfather   . Early death Paternal Grandfather   . Hypertension Brother   . Diabetes Brother   . Arthritis/Rheumatoid Other   . Parkinson's disease Other   . Heart disease Other   . Obesity Daughter   . Breast cancer Neg Hx    Social History   Socioeconomic History  . Marital status: Married    Spouse name: Legrand Como  . Number of children: 2  .  Years of education: 29  . Highest education level: Some college, no degree  Occupational History  . Occupation: Retired  Scientific laboratory technician  . Financial resource strain: Not hard at all  . Food insecurity:    Worry: Never true    Inability: Never true  . Transportation needs:    Medical: No    Non-medical: No  Tobacco Use  . Smoking status: Former Research scientist (life sciences)  . Smokeless tobacco: Never Used  . Tobacco comment: Quit in 1987  Substance and Sexual Activity  . Alcohol use: Yes    Alcohol/week: 12.0 standard drinks    Types: 12 Glasses of wine per week    Comment: wine or alcohol daily  . Drug use: No  . Sexual activity: Yes  Lifestyle  . Physical activity:    Days per week: 2 days    Minutes per session: 120 min  . Stress: Very much  Relationships  . Social connections:    Talks on phone: More than three times a week    Gets together: Twice a week    Attends religious  service: Never    Active member of club or organization: Yes    Attends meetings of clubs or organizations: More than 4 times per year    Relationship status: Married  Other Topics Concern  . Not on file  Social History Narrative   Patient is married Legrand Como) and lives with her husband.   Patient is retired.   Patient has two children.   Patient is right-handed.   Patient has a college education.   Patient drinks two cups of coffee daily.             Outpatient Encounter Medications as of 10/14/2018  Medication Sig  . calcium-vitamin D (OSCAL WITH D) 500-200 MG-UNIT tablet Take 2 tablets by mouth daily with breakfast.  . diazepam (VALIUM) 5 MG tablet Take 1 tablet (5 mg total) by mouth every 6 (six) hours as needed. for anxiety  . fluticasone (FLONASE) 50 MCG/ACT nasal spray Place 2 sprays into both nostrils daily.  . Multiple Vitamins-Minerals (CENTRUM ADULTS PO) Take by mouth.  . Travoprost, BAK Free, (TRAVATAN) 0.004 % SOLN ophthalmic solution Place 1 drop into both eyes at bedtime.  . Omega-3 Fatty Acids (FISH OIL PO) Take by mouth.   No facility-administered encounter medications on file as of 10/14/2018.     Activities of Daily Living In your present state of health, do you have any difficulty performing the following activities: 10/14/2018  Hearing? Y  Comment Referral placed for hearing testing  Vision? N  Difficulty concentrating or making decisions? N  Walking or climbing stairs? N  Dressing or bathing? N  Doing errands, shopping? N  Preparing Food and eating ? N  Using the Toilet? N  In the past six months, have you accidently leaked urine? N  Do you have problems with loss of bowel control? N  Managing your Medications? N  Managing your Finances? N  Housekeeping or managing your Housekeeping? N  Some recent data might be hidden    Patient Care Team: Vivi Barrack, MD as PCP - General (Family Medicine) Dennie Bible, NP as Nurse Practitioner  (Neurology) Marylynn Pearson, MD as Consulting Physician (Ophthalmology) Dohmeier, Asencion Partridge, MD as Consulting Physician (Neurology) Dentistry, [facility]    Assessment:   This is a routine wellness examination for Zaeda.  Exercise Activities and Dietary recommendations Current Exercise Habits: Structured exercise class([employer]), Type of exercise: treadmill, Time (Minutes): 60,  Frequency (Times/Week): 2, Weekly Exercise (Minutes/Week): 120, Intensity: Mild, Exercise limited by: orthopedic condition(s)   Breakfast: coffee, eats something around 10am. Cereal or leftovers, oatmeal  Lunch: Sometimes she goes to [employer]. Vegetables, chicken, sauteed spinach with vinegar, drinks water  Dinner: Leftovers, chicken and vegetables, drinks water or cranberry juice  Snacks on chips at night, occasional rum with orange and cranberry juice, or gin and tonic, some kind of alcohol every night Goals    . Reduce alcohol intake     Patient states that she wants to try to decrease her alcohol intake daily.        Fall Risk Fall Risk  10/14/2018 05/24/2018 01/19/2018 10/01/2017 09/27/2017  Falls in the past year? 0 No No No No    Depression Screen PHQ 2/9 Scores 10/14/2018 05/24/2018 01/19/2018 10/01/2017  PHQ - 2 Score 3 0 0 0  PHQ- 9 Score 7 - - -  Exception Documentation - - - Medical reason     Cognitive Function MMSE - Mini Mental State Exam 10/14/2018  Orientation to time 5  Orientation to Place 5  Registration 3  Attention/ Calculation 5  Recall 3  Language- name 2 objects 2  Language- repeat 1  Language- follow 3 step command 3  Language- read & follow direction 1  Write a sentence 1  Copy design 1  Total score 30   Montreal Cognitive Assessment  03/10/2017 03/10/2016  Visuospatial/ Executive (0/5) 5 3  Naming (0/3) 3 3  Attention: Read list of digits (0/2) 2 2  Attention: Read list of letters (0/1) 1 1  Attention: Serial 7 subtraction starting at 100 (0/3) 2 3  Language:  Repeat phrase (0/2) 1 1  Language : Fluency (0/1) 1 0  Abstraction (0/2) 2 2  Delayed Recall (0/5) 5 3  Orientation (0/6) 6 6  Total 28 24  Adjusted Score (based on education) - 25   6CIT Screen 09/27/2017  What Year? 0 points  What month? 0 points  What time? 0 points  Count back from 20 0 points  Months in reverse 0 points  Repeat phrase 6 points  Total Score 6    Immunization History  Administered Date(s) Administered  . Pneumococcal Conjugate-13 09/22/2016  . Pneumococcal Polysaccharide-23 07/27/2012  . Td 11/03/2007  . Zoster 10/02/2015      Screening Tests Health Maintenance  Topic Date Due  . TETANUS/TDAP  05/25/2019 (Originally 11/02/2017)  . INFLUENZA VACCINE  09/28/2027 (Originally 06/02/2018)  . DEXA SCAN  Completed  . PNA vac Low Risk Adult  Completed         Plan:   Follow Up with PCP as Advised   I have personally reviewed and noted the following in the patient's chart:   . Medical and social history . Use of alcohol, tobacco or illicit drugs  . Current medications and supplements . Functional ability and status . Nutritional status . Physical activity . Advanced directives . List of other physicians . Vitals . Screenings to include cognitive, depression, and falls . Referrals and appointments  In addition, I have reviewed and discussed with patient certain preventive protocols, quality metrics, and best practice recommendations. A written personalized care plan for preventive services as well as general preventive health recommendations were provided to patient.     [place], [place]  17/79/3903

## 2018-10-14 NOTE — Assessment & Plan Note (Signed)
Likely secondary to hemorrhoids.  Vitals stable.  Check CBC.  Referral placed to GI.

## 2018-10-14 NOTE — Assessment & Plan Note (Signed)
Had a lengthy discussion with patient regarding treatment options.  She does not want to have psychotherapy at this time.  Discussed a few mindfulness techniques.  She will continue Valium 5 mg as needed.  This is refilled today.

## 2018-10-14 NOTE — Patient Instructions (Signed)
It was very nice to see you today!  We will check blood work today.  Please make sure you are getting plenty of fluids.  You can take MiraLAX as needed.  Please also make sure you are taking plenty of fiber in your diet.  We will send you to see the GI doctor also.  Take care, Dr Jerline Pain

## 2018-10-14 NOTE — Assessment & Plan Note (Signed)
Check lipid panel today 

## 2018-10-19 ENCOUNTER — Other Ambulatory Visit: Payer: Self-pay

## 2018-10-19 ENCOUNTER — Encounter: Payer: Self-pay | Admitting: Family Medicine

## 2018-10-19 ENCOUNTER — Telehealth: Payer: Self-pay | Admitting: Family Medicine

## 2018-10-19 DIAGNOSIS — R739 Hyperglycemia, unspecified: Secondary | ICD-10-CM | POA: Insufficient documentation

## 2018-10-19 MED ORDER — ATORVASTATIN CALCIUM 40 MG PO TABS: 40.0000 mg | ORAL_TABLET | Freq: Every day | ORAL | 3 refills | Status: DC

## 2018-10-19 NOTE — Progress Notes (Signed)
Please inform patient of the following:  Her cholesterol and blood sugar levels were up a bit. Recommend starting lipitor 40mg  daily to improve cholesterol numbers and lower risk of heart attack and stroke. Please send this in if she is willing to start. She should continue working on regular exercise and a healthy diet.  All of her other labs are normal.  We can recheck again in 1 year.

## 2018-10-19 NOTE — Telephone Encounter (Signed)
See note

## 2018-10-19 NOTE — Telephone Encounter (Signed)
Copied from [place] [id number]. Topic: Referral - Question >> Oct 19, 2018  8:41 AM Keene Breath wrote: Reason for CRM: Michelene Heady with [facility]. Called to request information regarding a referral.  She stated that there is some information missing from the doctor's office that needs to be filled out before the referral can be accepted.  CB# [phone]

## 2018-12-06 ENCOUNTER — Encounter: Payer: Self-pay | Admitting: Family Medicine

## 2018-12-06 ENCOUNTER — Ambulatory Visit (INDEPENDENT_AMBULATORY_CARE_PROVIDER_SITE_OTHER): Payer: Medicare HMO | Admitting: Family Medicine

## 2018-12-06 VITALS — BP 124/78 | HR 79 | Temp 98.4°F | Ht 63.0 in | Wt 180.0 lb

## 2018-12-06 DIAGNOSIS — E785 Hyperlipidemia, unspecified: Secondary | ICD-10-CM

## 2018-12-06 DIAGNOSIS — M25561 Pain in right knee: Secondary | ICD-10-CM

## 2018-12-06 DIAGNOSIS — M25551 Pain in right hip: Secondary | ICD-10-CM | POA: Diagnosis not present

## 2018-12-06 DIAGNOSIS — F419 Anxiety disorder, unspecified: Secondary | ICD-10-CM | POA: Diagnosis not present

## 2018-12-06 DIAGNOSIS — S76311A Strain of muscle, fascia and tendon of the posterior muscle group at thigh level, right thigh, initial encounter: Secondary | ICD-10-CM | POA: Diagnosis not present

## 2018-12-06 NOTE — Assessment & Plan Note (Addendum)
No red flags.  She seems to be managing a difficult time of year for her as best as possible. Continue Valium as needed.

## 2018-12-06 NOTE — Assessment & Plan Note (Signed)
Patient declined starting statin.  We will recheck lipid panel again in 6 to 12 months.

## 2018-12-06 NOTE — Patient Instructions (Signed)
It was very nice to see you today!  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  I also think that you probably strained her hamstring a little bit.  Please work on the exercises.  You can take ibuprofen as needed.  Let me know if not improving able we will get you into see Dr. Paulla Fore.  Take care, Dr Jerline Pain

## 2018-12-06 NOTE — Progress Notes (Signed)
   Chief Complaint:  Nicole Holt is a 78 y.o. female who presents for same day appointment with a chief complaint of leg Holt.   Assessment/Plan:  Right Leg Holt Likely multifactorial in setting of underlying knee and hip osteoarthritis.  She may have a small hamstring strain as well.  Symptoms symptoms are improving.  Discussed home exercise program and handout was given.  Continue over-the-counter ibuprofen as needed.  Discussed reasons return to care.  Follow-up with sports medicine if not improving.  Dyslipidemia Patient declined starting statin.  We will recheck lipid panel again in 6 to 12 months.  Anxiety No red flags.  She seems to be managing a difficult time of year for her as best as possible. Continue Valium as needed.      Subjective:  HPI:  Leg Holt, acute problem Started about a week ago.  Located in right knee and radiates into her low back.  Was doing work around the house when she bent over and felt a sudden Holt to the outer part of her right knee. Took ibuprofen which helped. Symptoms have improved over the past few days. No other obvious alleviating or aggravating factors.   Dyslipidemia Found on most recent blood work. Did not start the lipitor.   Anxiety Overall, her symptoms are stable, however she does note this is a more difficult time of year to her due to the anniversary of her husband's suicide and passing of her parents.  She occasionally takes Valium as needed which helps.   ROS: Per HPI  PMH: She reports that she has quit smoking. She has never used smokeless tobacco. She reports current alcohol use of about 12.0 standard drinks of alcohol per week. She reports that she does not use drugs.      Objective:  Physical Exam: BP 124/78 (BP Location: Left Arm, Patient Position: Sitting, Cuff Size: Normal)   Pulse 79   Temp 98.4 F (36.9 C) (Oral)   Ht 5\' 3"  (1.6 m)   Wt 180 lb (81.6 kg)   SpO2 97%   BMI 31.89 kg/m   Gen: NAD, resting  comfortably MSK: -Right hip: No deformities.  Limited range of motion with internal and external rotation.  Holt elicited with resisted abduction and flexion. -Right knee: Tender palpation along joint line.  Crepitus with active range of motion.  Negative anterior and posterior drawer signs.  Neurovascular intact distally.      Nicole Holt. Nicole Pain, MD 12/06/2018 11:28 AM

## 2019-02-22 DIAGNOSIS — Z85828 Personal history of other malignant neoplasm of skin: Secondary | ICD-10-CM | POA: Diagnosis not present

## 2019-02-22 DIAGNOSIS — G4733 Obstructive sleep apnea (adult) (pediatric): Secondary | ICD-10-CM | POA: Diagnosis not present

## 2019-02-22 DIAGNOSIS — L821 Other seborrheic keratosis: Secondary | ICD-10-CM | POA: Diagnosis not present

## 2019-02-22 DIAGNOSIS — D485 Neoplasm of uncertain behavior of skin: Secondary | ICD-10-CM | POA: Diagnosis not present

## 2019-02-22 DIAGNOSIS — D1801 Hemangioma of skin and subcutaneous tissue: Secondary | ICD-10-CM | POA: Diagnosis not present

## 2019-02-22 DIAGNOSIS — C44212 Basal cell carcinoma of skin of right ear and external auricular canal: Secondary | ICD-10-CM | POA: Diagnosis not present

## 2019-03-07 DIAGNOSIS — C44212 Basal cell carcinoma of skin of right ear and external auricular canal: Secondary | ICD-10-CM | POA: Diagnosis not present

## 2019-03-07 DIAGNOSIS — Z85828 Personal history of other malignant neoplasm of skin: Secondary | ICD-10-CM | POA: Diagnosis not present

## 2019-03-09 ENCOUNTER — Telehealth: Payer: Self-pay | Admitting: Neurology

## 2019-03-09 NOTE — Telephone Encounter (Signed)
Called patient to offer a virtual visit for her 5/14 yearly cpap appointment. Patient stated that she did not know whether or not she needed this appointment. I informed her that a yearly follow-up for her cpap is necessary as it works just like a prescription- an appt is needed in order to have a new prescription for the cpap supplies. She stated that she wanted a new cpap machine. I told her that in order to get a new cpap machine she would need to talk to Dr. Brett Fairy and it is recommended that she keep her appointment. She stated that she would like to cancel this appointment.

## 2019-03-16 ENCOUNTER — Ambulatory Visit: Payer: Medicare HMO | Admitting: Neurology

## 2019-04-24 ENCOUNTER — Other Ambulatory Visit: Payer: Self-pay

## 2019-04-24 ENCOUNTER — Encounter: Payer: Self-pay | Admitting: Family Medicine

## 2019-04-24 ENCOUNTER — Ambulatory Visit (INDEPENDENT_AMBULATORY_CARE_PROVIDER_SITE_OTHER): Payer: Medicare HMO | Admitting: Family Medicine

## 2019-04-24 VITALS — BP 124/80 | HR 83 | Temp 97.7°F | Ht 63.0 in | Wt 182.2 lb

## 2019-04-24 DIAGNOSIS — E785 Hyperlipidemia, unspecified: Secondary | ICD-10-CM | POA: Diagnosis not present

## 2019-04-24 DIAGNOSIS — J3489 Other specified disorders of nose and nasal sinuses: Secondary | ICD-10-CM | POA: Diagnosis not present

## 2019-04-24 DIAGNOSIS — K625 Hemorrhage of anus and rectum: Secondary | ICD-10-CM

## 2019-04-24 DIAGNOSIS — J309 Allergic rhinitis, unspecified: Secondary | ICD-10-CM | POA: Diagnosis not present

## 2019-04-24 DIAGNOSIS — F419 Anxiety disorder, unspecified: Secondary | ICD-10-CM

## 2019-04-24 LAB — COMPREHENSIVE METABOLIC PANEL
ALT: 18 U/L (ref 0–35)
AST: 18 U/L (ref 0–37)
Albumin: 4.3 g/dL (ref 3.5–5.2)
Alkaline Phosphatase: 64 U/L (ref 39–117)
BUN: 14 mg/dL (ref 6–23)
CO2: 28 mEq/L (ref 19–32)
Calcium: 9.3 mg/dL (ref 8.4–10.5)
Chloride: 102 mEq/L (ref 96–112)
Creatinine, Ser: 0.89 mg/dL (ref 0.40–1.20)
GFR: 61.52 mL/min (ref >60.00)
Glucose, Bld: 95 mg/dL (ref 70–99)
Potassium: 4.6 mEq/L (ref 3.5–5.1)
Sodium: 138 mEq/L (ref 135–145)
Total Bilirubin: 0.5 mg/dL (ref 0.2–1.2)
Total Protein: 6.5 g/dL (ref 6.0–8.3)

## 2019-04-24 LAB — CBC
HCT: 41.5 % (ref 36.0–46.0)
Hemoglobin: 14.1 g/dL (ref 12.0–15.0)
MCHC: 34 g/dL (ref 30.0–36.0)
MCV: 93.3 fl (ref 78.0–100.0)
Platelets: 253 10*3/uL (ref 150.0–400.0)
RBC: 4.45 Mil/uL (ref 3.87–5.11)
RDW: 14.2 % (ref 11.5–15.5)
WBC: 6.4 10*3/uL (ref 4.0–10.5)

## 2019-04-24 LAB — LIPID PANEL
Cholesterol: 179 mg/dL (ref 0–200)
HDL: 57.4 mg/dL (ref >39.00)
LDL Cholesterol: 98 mg/dL (ref 0–99)
NonHDL: 121.63
Total CHOL/HDL Ratio: 3
Triglycerides: 120 mg/dL (ref 0.0–149.0)
VLDL: 24 mg/dL (ref 0.0–40.0)

## 2019-04-24 LAB — TSH: TSH: 1.44 u[IU]/mL (ref 0.35–4.50)

## 2019-04-24 MED ORDER — DIAZEPAM 5 MG PO TABS: 5.0000 mg | ORAL_TABLET | Freq: Four times a day (QID) | ORAL | 0 refills | Status: DC | PRN

## 2019-04-24 MED ORDER — FLUTICASONE PROPIONATE 50 MCG/ACT NA SUSP: 2.0000 | Freq: Every day | NASAL | 12 refills | Status: DC

## 2019-04-24 NOTE — Assessment & Plan Note (Signed)
Check CBC, CMET, lipid panel, and TSH.

## 2019-04-24 NOTE — Patient Instructions (Signed)
It was very nice to see you today!  You have no signs of blockages in your arteries or veins.  We will check blood work today.  I will refill your Flonase and Valium.  Please schedule an appointment with Lattie Haw soon.  Come back to see me in 3 to 6 months, or sooner as needed.  Take care, Dr Jerline Pain

## 2019-04-24 NOTE — Assessment & Plan Note (Signed)
Stable. Continue Flonase 

## 2019-04-24 NOTE — Progress Notes (Signed)
   Chief Complaint:  Nicole Holt is a 78 y.o. female who presents today with a chief complaint of anxiety.   Assessment/Plan:  Allergic rhinitis Stable.  Continue Flonase.  Anxiety Worsened since last visit.  She would like to follow-up with behavioral health specialist as she meant was given with contact information.  She stated she would contact them soon.  We will continue Valium 5 mg q6hrs daily as needed.  Rectal bleeding Advised her to follow up with GI soon. Will check CBC.   Dyslipidemia Check CBC, CMET, lipid panel, and TSH.     Subjective:  HPI:  Her stable, chronic medical conditions are outlined below:  # Dyslipidemia Found on blood work 6 months ago. She refused started a statin at this time. Would like to have blood work rechecked.   # Anxiety / Depression - Worsened since last visit.  - Has a lot of anxiety and depression/bereaveent surrounding the suicide of her late husband and passing of several family members - Takes valium as needed which works well - ROS: No reported SI or HI  # Allergic Rhinitis - Uses flonase as needed seasonally  ROS: Per HPI  PMH: She reports that she has quit smoking. She has never used smokeless tobacco. She reports current alcohol use of about 12.0 standard drinks of alcohol per week. She reports that she does not use drugs.      Objective:  Physical Exam: BP 124/80 (BP Location: Left Arm, Patient Position: Sitting, Cuff Size: Normal)   Pulse 83   Temp 97.7 F (36.5 C) (Oral)   Ht 5\' 3"  (1.6 m)   Wt 182 lb 3.2 oz (82.6 kg)   SpO2 98%   BMI 32.28 kg/m   Gen: NAD, resting comfortably CV: Regular rate and rhythm with no murmurs appreciated.  No carotid bruits. Pulm: Normal work of breathing, clear to auscultation bilaterally with no crackles, wheezes, or rhonchi      Lanny Lipkin M. Jerline Pain, MD 04/24/2019 3:50 PM

## 2019-04-24 NOTE — Assessment & Plan Note (Signed)
Advised her to follow up with GI soon. Will check CBC.

## 2019-04-24 NOTE — Assessment & Plan Note (Signed)
Worsened since last visit.  She would like to follow-up with behavioral health specialist as she meant was given with contact information.  She stated she would contact them soon.  We will continue Valium 5 mg q6hrs daily as needed.

## 2019-04-25 NOTE — Progress Notes (Signed)
Please inform patient of the following:  All of her labs are NORMAL. Cholesterol levels look much better. Do not need to change her treatment plan at this time. We can recheck in 6-12 months.  Algis Greenhouse. Jerline Pain, MD 04/25/2019 8:12 AM

## 2019-04-26 ENCOUNTER — Telehealth: Payer: Self-pay

## 2019-04-26 NOTE — Telephone Encounter (Signed)
Copied from [place] [phone]. Topic: General - Other >> Apr 26, 2019 11:39 AM Marin Olp L wrote: Reason for CRM: Patient returning missed call RE lab results. Patient also wants them mailed to her home address.

## 2019-04-26 NOTE — Telephone Encounter (Signed)
Patient has received her lab results.

## 2019-05-10 DIAGNOSIS — Z8371 Family history of colonic polyps: Secondary | ICD-10-CM | POA: Diagnosis not present

## 2019-05-10 DIAGNOSIS — K921 Melena: Secondary | ICD-10-CM | POA: Diagnosis not present

## 2019-05-10 DIAGNOSIS — R198 Other specified symptoms and signs involving the digestive system and abdomen: Secondary | ICD-10-CM | POA: Diagnosis not present

## 2019-05-17 DIAGNOSIS — K921 Melena: Secondary | ICD-10-CM | POA: Diagnosis not present

## 2019-05-17 DIAGNOSIS — Z8371 Family history of colonic polyps: Secondary | ICD-10-CM | POA: Diagnosis not present

## 2019-05-17 DIAGNOSIS — Z800 Family history of malignant neoplasm of digestive organs: Secondary | ICD-10-CM | POA: Diagnosis not present

## 2019-05-17 DIAGNOSIS — D120 Benign neoplasm of cecum: Secondary | ICD-10-CM | POA: Diagnosis not present

## 2019-05-17 DIAGNOSIS — K5730 Diverticulosis of large intestine without perforation or abscess without bleeding: Secondary | ICD-10-CM | POA: Diagnosis not present

## 2019-05-17 DIAGNOSIS — D125 Benign neoplasm of sigmoid colon: Secondary | ICD-10-CM | POA: Diagnosis not present

## 2019-05-17 LAB — HM COLONOSCOPY

## 2019-05-19 DIAGNOSIS — D125 Benign neoplasm of sigmoid colon: Secondary | ICD-10-CM | POA: Diagnosis not present

## 2019-05-19 DIAGNOSIS — D120 Benign neoplasm of cecum: Secondary | ICD-10-CM | POA: Diagnosis not present

## 2019-06-06 DIAGNOSIS — G4733 Obstructive sleep apnea (adult) (pediatric): Secondary | ICD-10-CM | POA: Diagnosis not present

## 2019-06-14 ENCOUNTER — Encounter: Payer: Self-pay | Admitting: Family Medicine

## 2019-07-04 ENCOUNTER — Other Ambulatory Visit: Payer: Self-pay | Admitting: Family Medicine

## 2019-07-04 DIAGNOSIS — Z1231 Encounter for screening mammogram for malignant neoplasm of breast: Secondary | ICD-10-CM

## 2019-08-15 DIAGNOSIS — H401132 Primary open-angle glaucoma, bilateral, moderate stage: Secondary | ICD-10-CM | POA: Diagnosis not present

## 2019-08-15 DIAGNOSIS — H04221 Epiphora due to insufficient drainage, right lacrimal gland: Secondary | ICD-10-CM | POA: Diagnosis not present

## 2019-08-15 DIAGNOSIS — H2511 Age-related nuclear cataract, right eye: Secondary | ICD-10-CM | POA: Diagnosis not present

## 2019-08-15 DIAGNOSIS — H43813 Vitreous degeneration, bilateral: Secondary | ICD-10-CM | POA: Diagnosis not present

## 2019-08-21 ENCOUNTER — Ambulatory Visit
Admission: RE | Admit: 2019-08-21 | Discharge: 2019-08-21 | Disposition: A | Discharge status: Home / Self Care | Payer: Medicare HMO | Source: Ambulatory Visit | Attending: Family Medicine | Admitting: Family Medicine

## 2019-08-21 ENCOUNTER — Other Ambulatory Visit: Payer: Self-pay

## 2019-08-21 DIAGNOSIS — Z1231 Encounter for screening mammogram for malignant neoplasm of breast: Secondary | ICD-10-CM

## 2019-08-31 ENCOUNTER — Ambulatory Visit (INDEPENDENT_AMBULATORY_CARE_PROVIDER_SITE_OTHER): Payer: Medicare HMO | Admitting: Family Medicine

## 2019-08-31 ENCOUNTER — Encounter: Payer: Self-pay | Admitting: Family Medicine

## 2019-08-31 ENCOUNTER — Other Ambulatory Visit: Payer: Self-pay

## 2019-08-31 VITALS — BP 132/74 | HR 67 | Temp 98.0°F | Ht 63.0 in | Wt 175.2 lb

## 2019-08-31 DIAGNOSIS — G4700 Insomnia, unspecified: Secondary | ICD-10-CM

## 2019-08-31 DIAGNOSIS — M65311 Trigger thumb, right thumb: Secondary | ICD-10-CM | POA: Diagnosis not present

## 2019-08-31 MED ORDER — DICLOFENAC SODIUM 75 MG PO TBEC: 75.0000 mg | DELAYED_RELEASE_TABLET | Freq: Two times a day (BID) | ORAL | 0 refills | Status: DC

## 2019-08-31 NOTE — Patient Instructions (Signed)
It was very nice to see you today!  Please try the the diclofenac.   Try the benadryl for sleep.  Let me know if not improving in 5-7 days.  Take care, Dr Jerline Pain  Please try these tips to maintain a healthy lifestyle:   Eat at least 3 REAL meals and 1-2 snacks per day.  Aim for no more than 5 hours between eating.  If you eat breakfast, please do so within one hour of getting up.    Obtain twice as many fruits/vegetables as protein or carbohydrate foods for both lunch and dinner. (Half of each meal should be fruits/vegetables, one quarter protein, and one quarter starchy carbs)   Cut down on sweet beverages. This includes juice, soda, and sweet tea.    Exercise at least 150 minutes every week.

## 2019-08-31 NOTE — Progress Notes (Signed)
   Chief Complaint:  Nicole Holt is a 78 y.o. female who presents today with a chief complaint of thumb pain.   Assessment/Plan:  Trigger thumb Offered corticosteroid injection today however patient declined.  We will start diclofenac 75 mg twice daily for the next week.  If no improvement would consider referral to sports medicine.  Discussed reasons return to care.  Insomnia Briefly discussed lifestyle modifications.  Recommended melatonin.  Also recommended Benadryl.  Discussed reasons return to care.    Subjective:  HPI:  Thumb Pain Started 3 weeks ago. No obvious precipitating events. Sometimes feels like her thumb becomes locked in position.  Some swelling as well.  Located on right thumb.  No treatments tried.  Symptoms are stable.  No other obvious aggravating or alleviating factors.  Insomnia Several year history.  Has tried melatonin with modest improvement.  Has difficulty staying asleep.  Her anxiety and depression seem to be at baseline.  ROS: Per HPI  PMH: She reports that she has quit smoking. She has never used smokeless tobacco. She reports current alcohol use of about 12.0 standard drinks of alcohol per week. She reports that she does not use drugs.      Objective:  Physical Exam: BP 132/74   Pulse 67   Temp 98 F (36.7 C)   Ht 5\' 3"  (1.6 m)   Wt 175 lb 3.2 oz (79.5 kg)   SpO2 96%   BMI 31.04 kg/m  Wt Readings from Last 3 Encounters:  08/31/19 175 lb 3.2 oz (79.5 kg)  04/24/19 182 lb 3.2 oz (82.6 kg)  12/06/18 180 lb (81.6 kg)  MSK: -Right thumb: Degenerative changes noted.  Full range of motion.  Some catching with active extension noted.  Mildly tender to palpation along MCP joint.  Neurovascular intact distally.     Algis Greenhouse. Jerline Pain, MD 08/31/2019 10:25 AM

## 2019-08-31 NOTE — Assessment & Plan Note (Signed)
Briefly discussed lifestyle modifications.  Recommended melatonin.  Also recommended Benadryl.  Discussed reasons return to care.

## 2019-10-23 ENCOUNTER — Ambulatory Visit (INDEPENDENT_AMBULATORY_CARE_PROVIDER_SITE_OTHER): Payer: Medicare HMO

## 2019-10-23 ENCOUNTER — Other Ambulatory Visit: Payer: Self-pay

## 2019-10-23 VITALS — BP 126/72 | HR 74 | Temp 98.3°F | Ht 63.0 in | Wt 176.4 lb

## 2019-10-23 DIAGNOSIS — Z0000 Encounter for general adult medical examination without abnormal findings: Secondary | ICD-10-CM

## 2019-10-23 NOTE — Patient Instructions (Signed)
[name] , Thank you for taking time to come for your Medicare Wellness Visit. I appreciate your ongoing commitment to your health goals. Please review the following plan we discussed and let me know if I can assist you in the future.   Screening recommendations/referrals: Colorectal Screening: up to date; colonoscopy 05/17/19 Mammogram: up to date; last 08/21/19 Bone Density: up to date; last 12/09/16  Vision and Dental Exams: Recommended annual ophthalmology exams for early detection of glaucoma and other disorders of the eye Recommended annual dental exams for proper oral hygiene  Vaccinations: Influenza vaccine:  recommended this fall either at PCP office or through your local pharmacy  Pneumococcal vaccine: up to date; last 09/22/16 Tdap vaccine: recommended; Please call your insurance company to determine your out of pocket expense. You may receive this vaccine at your local pharmacy or Health Dept. Shingles vaccine: Please call your insurance company to determine your out of pocket expense for the Shingrix vaccine. You may receive this vaccine at your local pharmacy.  Advanced directives: We have received a copy of your POA (Power of [place]) and/or Living Will. These documents can be located in your chart.  Goals: Recommend to drink at least 6-8 8oz glasses of water per day and consume a balanced diet rich in fresh fruits and vegetables.   Next appointment: Please schedule your Annual Wellness Visit with your Nurse Health Advisor in one year.  Preventive Care 30 Years and Older, Female Preventive care refers to lifestyle choices and visits with your health care provider that can promote health and wellness. What does preventive care include?  A yearly physical exam. This is also called an annual well check.  Dental exams once or twice a year.  Routine eye exams. Ask your health care provider how often you should have your eyes checked.  Personal lifestyle choices,  including:  Daily care of your teeth and gums.  Regular physical activity.  Eating a healthy diet.  Avoiding tobacco and drug use.  Limiting alcohol use.  Practicing safe sex.  Taking low-dose aspirin every day if recommended by your health care provider.  Taking vitamin and mineral supplements as recommended by your health care provider. What happens during an annual well check? The services and screenings done by your health care provider during your annual well check will depend on your age, overall health, lifestyle risk factors, and family history of disease. Counseling  Your health care provider may ask you questions about your:  Alcohol use.  Tobacco use.  Drug use.  Emotional well-being.  Home and relationship well-being.  Sexual activity.  Eating habits.  History of falls.  Memory and ability to understand (cognition).  Work and work Statistician.  Reproductive health. Screening  You may have the following tests or measurements:  Height, weight, and BMI.  Blood pressure.  Lipid and cholesterol levels. These may be checked every 5 years, or more frequently if you are over 60 years old.  Skin check.  Lung cancer screening. You may have this screening every year starting at age 71 if you have a 30-pack-year history of smoking and currently smoke or have quit within the past 15 years.  Fecal occult blood test (FOBT) of the stool. You may have this test every year starting at age 41.  Flexible sigmoidoscopy or colonoscopy. You may have a sigmoidoscopy every 5 years or a colonoscopy every 10 years starting at age 45.  Hepatitis C blood test.  Hepatitis B blood test.  Sexually transmitted disease (  STD) testing.  Diabetes screening. This is done by checking your blood sugar (glucose) after you have not eaten for a while (fasting). You may have this done every 1-3 years.  Bone density scan. This is done to screen for osteoporosis. You may have this  done starting at age 14.  Mammogram. This may be done every 1-2 years. Talk to your health care provider about how often you should have regular mammograms. Talk with your health care provider about your test results, treatment options, and if necessary, the need for more tests. Vaccines  Your health care provider may recommend certain vaccines, such as:  Influenza vaccine. This is recommended every year.  Tetanus, diphtheria, and acellular pertussis (Tdap, Td) vaccine. You may need a Td booster every 10 years.  Zoster vaccine. You may need this after age 9.  Pneumococcal 13-valent conjugate (PCV13) vaccine. One dose is recommended after age 79.  Pneumococcal polysaccharide (PPSV23) vaccine. One dose is recommended after age 34. Talk to your health care provider about which screenings and vaccines you need and how often you need them. This information is not intended to replace advice given to you by your health care provider. Make sure you discuss any questions you have with your health care provider. Document Released: 11/15/2015 Document Revised: 07/08/2016 Document Reviewed: 08/20/2015 Elsevier Interactive Patient Education  2017 [place] Prevention in the Home Falls can cause injuries. They can happen to people of all ages. There are many things you can do to make your home safe and to help prevent falls. What can I do on the outside of my home?  Regularly fix the edges of walkways and driveways and fix any cracks.  Remove anything that might make you trip as you walk through a door, such as a raised step or threshold.  Trim any bushes or trees on the path to your home.  Use bright outdoor lighting.  Clear any walking paths of anything that might make someone trip, such as rocks or tools.  Regularly check to see if handrails are loose or broken. Make sure that both sides of any steps have handrails.  Any raised decks and porches should have guardrails on the  edges.  Have any leaves, snow, or ice cleared regularly.  Use sand or salt on walking paths during winter.  Clean up any spills in your garage right away. This includes oil or grease spills. What can I do in the bathroom?  Use night lights.  Install grab bars by the toilet and in the tub and shower. Do not use towel bars as grab bars.  Use non-skid mats or decals in the tub or shower.  If you need to sit down in the shower, use a plastic, non-slip stool.  Keep the floor dry. Clean up any water that spills on the floor as soon as it happens.  Remove soap buildup in the tub or shower regularly.  Attach bath mats securely with double-sided non-slip rug tape.  Do not have throw rugs and other things on the floor that can make you trip. What can I do in the bedroom?  Use night lights.  Make sure that you have a light by your bed that is easy to reach.  Do not use any sheets or blankets that are too big for your bed. They should not hang down onto the floor.  Have a firm chair that has side arms. You can use this for support while you get dressed.  Do  not have throw rugs and other things on the floor that can make you trip. What can I do in the kitchen?  Clean up any spills right away.  Avoid walking on wet floors.  Keep items that you use a lot in easy-to-reach places.  If you need to reach something above you, use a strong step stool that has a grab bar.  Keep electrical cords out of the way.  Do not use floor polish or wax that makes floors slippery. If you must use wax, use non-skid floor wax.  Do not have throw rugs and other things on the floor that can make you trip. What can I do with my stairs?  Do not leave any items on the stairs.  Make sure that there are handrails on both sides of the stairs and use them. Fix handrails that are broken or loose. Make sure that handrails are as long as the stairways.  Check any carpeting to make sure that it is firmly  attached to the stairs. Fix any carpet that is loose or worn.  Avoid having throw rugs at the top or bottom of the stairs. If you do have throw rugs, attach them to the floor with carpet tape.  Make sure that you have a light switch at the top of the stairs and the bottom of the stairs. If you do not have them, ask someone to add them for you. What else can I do to help prevent falls?  Wear shoes that:  Do not have high heels.  Have rubber bottoms.  Are comfortable and fit you well.  Are closed at the toe. Do not wear sandals.  If you use a stepladder:  Make sure that it is fully opened. Do not climb a closed stepladder.  Make sure that both sides of the stepladder are locked into place.  Ask someone to hold it for you, if possible.  Clearly mark and make sure that you can see:  Any grab bars or handrails.  First and last steps.  Where the edge of each step is.  Use tools that help you move around (mobility aids) if they are needed. These include:  Canes.  Walkers.  Scooters.  Crutches.  Turn on the lights when you go into a dark area. Replace any light bulbs as soon as they burn out.  Set up your furniture so you have a clear path. Avoid moving your furniture around.  If any of your floors are uneven, fix them.  If there are any pets around you, be aware of where they are.  Review your medicines with your doctor. Some medicines can make you feel dizzy. This can increase your chance of falling. Ask your doctor what other things that you can do to help prevent falls. This information is not intended to replace advice given to you by your health care provider. Make sure you discuss any questions you have with your health care provider. Document Released: 08/15/2009 Document Revised: 03/26/2016 Document Reviewed: 11/23/2014 Elsevier Interactive Patient Education  2017 [employer].

## 2019-10-23 NOTE — Progress Notes (Signed)
Subjective:   Nicole Holt is a 78 y.o. female who presents for Medicare Annual (Subsequent) preventive examination.  Review of Systems:   Cardiac Risk Factors include: advanced age (>32men, >69 women)    Objective:     Vitals: BP 126/72 (BP Location: Left Arm, Patient Position: Sitting, Cuff Size: Small)   Pulse 74   Temp 98.3 F (36.8 C) (Temporal)   Ht 5\' 3"  (1.6 m)   Wt 176 lb 6.4 oz (80 kg)   SpO2 97%   BMI 31.25 kg/m   Body mass index is 31.25 kg/m.  Advanced Directives 10/23/2019 10/14/2018 09/27/2017  Does Patient Have a Medical Advance Directive? Yes Yes Yes  Type of Advance Directive Living will;Healthcare Power of [place];Living will [name];Living will  Does patient want to make changes to medical advance directive? No - Patient declined No - Patient declined -  Copy of [name] in Chart? Yes - validated most recent copy scanned in chart (See row information) No - copy requested No - copy requested    Tobacco Social History   Tobacco Use  Smoking Status Former Smoker  Smokeless Tobacco Never Used  Tobacco Comment   Quit in 1987     Counseling given: Not Answered Comment: Quit in 1987   Clinical Intake:  Pre-visit preparation completed: Yes  Pain : No/denies pain  Diabetes: No  How often do you need to have someone help you when you read instructions, pamphlets, or other written materials from your doctor or pharmacy?: 1 - Never  Interpreter Needed?: No  Information entered by :: Denman George LPN  Past Medical History:  Diagnosis Date  . Allergy   . Anxiety    panic attack  . Cataract   . Depression   . GERD (gastroesophageal reflux disease)   . Glaucoma   . Glaucoma   . Hearing loss   . 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 Hyperlipidemia   . Migraines    ocular  . OSA on CPAP 02/02/2014   PSG 09-27-2012 RDI 18.9 and AHi 9.8,  Epworth 15 .titrated to only 5 cm water.   . Osteoporosis   . Sleep apnea   . Tinnitus    Past Surgical History:  Procedure Laterality Date  . cataract surgery    . COSMETIC SURGERY     Family History  Problem Relation Age of Onset  . Osteoporosis Mother   . Stroke Mother   . Hyperlipidemia Mother   . Hypertension Mother   . Kidney disease Father   . Congestive Heart Failure Father   . Heart disease Father   . Hyperlipidemia Father   . Stroke Sister   . Parkinson's disease Sister   . Heart disease Sister   . Heart disease Maternal Grandmother   . Gout Maternal Grandmother   . Stroke Maternal Grandfather   . Stroke Paternal Grandmother   . Thrombosis Paternal Grandfather   . Early death Paternal Grandfather   . Hypertension Brother   . Diabetes Brother   . Arthritis/Rheumatoid Other   . Parkinson's disease Other   . Heart disease Other   . Obesity Daughter   . Breast cancer Neg Hx    Social History   Socioeconomic History  . Marital status: Married    Spouse name: Legrand Como  . Number of children: 2  . Years of education: 16  . Highest education level:  Some college, no degree  Occupational History  . Occupation: Retired  Tobacco Use  . Smoking status: Former Research scientist (life sciences)  . Smokeless tobacco: Never Used  . Tobacco comment: Quit in 1987  Substance and Sexual Activity  . Alcohol use: Yes    Alcohol/week: 12.0 standard drinks    Types: 12 Glasses of wine per week    Comment: wine or alcohol daily  . Drug use: No  . Sexual activity: Yes  Other Topics Concern  . Not on file  Social History Narrative   Patient is married Legrand Como) and lives with her husband.   Patient is retired.   Patient has two children.   Patient is right-handed.   Patient has a college education.   Patient drinks two cups of coffee daily.            Social Determinants of Health   Financial Resource Strain:   . Difficulty of Paying Living Expenses: Not on file  Food Insecurity:   .  Worried About Charity fundraiser in the Last Year: Not on file  . Ran Out of Food in the Last Year: Not on file  Transportation Needs:   . Lack of Transportation (Medical): Not on file  . Lack of Transportation (Non-Medical): Not on file  Physical Activity:   . Days of Exercise per Week: Not on file  . Minutes of Exercise per Session: Not on file  Stress:   . Feeling of Stress : Not on file  Social Connections:   . Frequency of Communication with Friends and Family: Not on file  . Frequency of Social Gatherings with Friends and Family: Not on file  . Attends Religious Services: Not on file  . Active Member of Clubs or Organizations: Not on file  . Attends Archivist Meetings: Not on file  . Marital Status: Not on file    Outpatient Encounter Medications as of 10/23/2019  Medication Sig  . calcium-vitamin D (OSCAL WITH D) 500-200 MG-UNIT tablet Take 2 tablets by mouth daily with breakfast.  . diazepam (VALIUM) 5 MG tablet Take 1 tablet (5 mg total) by mouth every 6 (six) hours as needed. for anxiety  . diclofenac (VOLTAREN) 75 MG EC tablet Take 1 tablet (75 mg total) by mouth 2 (two) times daily.  . fluticasone (FLONASE) 50 MCG/ACT nasal spray Place 2 sprays into both nostrils daily.  . Melatonin 10 MG TABS Take by mouth.  . Multiple Vitamins-Minerals (CENTRUM ADULTS PO) Take by mouth.  . Omega-3 Fatty Acids (FISH OIL PO) Take by mouth.  . Travoprost, BAK Free, (TRAVATAN) 0.004 % SOLN ophthalmic solution Place 1 drop into both eyes at bedtime.  . Turmeric (QC TUMERIC COMPLEX PO) Take by mouth.   No facility-administered encounter medications on file as of 10/23/2019.    Activities of Daily Living In your present state of health, do you have any difficulty performing the following activities: 10/23/2019  Hearing? N  Vision? N  Difficulty concentrating or making decisions? N  Walking or climbing stairs? N  Dressing or bathing? N  Doing errands, shopping? N  Preparing  Food and eating ? N  Using the Toilet? N  In the past six months, have you accidently leaked urine? N  Do you have problems with loss of bowel control? N  Managing your Medications? N  Managing your Finances? N  Housekeeping or managing your Housekeeping? N  Some recent data might be hidden    Patient Care Team: Vivi Barrack,  MD as PCP - General (Family Medicine) Dennie Bible, NP as Nurse Practitioner (Neurology) Marylynn Pearson, MD as Consulting Physician (Ophthalmology) Dohmeier, Asencion Partridge, MD as Consulting Physician (Neurology) Dentistry, [facility]    Assessment:   This is a routine wellness examination for Selia.  Exercise Activities and Dietary recommendations Current Exercise Habits: Home exercise routine, Type of exercise: Other - see comments(walking), Time (Minutes): 30, Frequency (Times/Week): 4, Weekly Exercise (Minutes/Week): 120, Intensity: Mild  Goals    . Reduce alcohol intake     Patient states that she wants to try to decrease her alcohol intake daily.        Fall Risk Fall Risk  10/23/2019 10/14/2018 05/24/2018 01/19/2018 10/01/2017  Falls in the past year? 1 0 No No No  Injury with Fall? 1 - - - -  Follow up Falls evaluation completed;Education provided;Falls prevention discussed - - - -   Is the patient's home free of loose throw rugs in walkways, pet beds, electrical cords, etc?   yes      Grab bars in the bathroom? yes      Handrails on the stairs?   yes      Adequate lighting? Yes   Timed Get Up and Go performed: completed and within normal timeframe; no gait abnormalities noted    Depression Screen PHQ 2/9 Scores 10/23/2019 04/24/2019 12/06/2018 10/14/2018  PHQ - 2 Score 0 1 0 3  PHQ- 9 Score - 4 - 7  Exception Documentation - - - -     Cognitive Function- no cognitive concerns at this time  Cognitive Testing  Alert? Yes         Normal Appearance? Yes  Oriented to person? Yes           Place? Yes  Time? Yes  Recall of three  objects? Yes  Can perform simple calculations? Yes  Displays appropriate judgment? Yes  Can read the correct time from a watch face? Yes   MMSE - Mini Mental State Exam 10/14/2018  Orientation to time 5  Orientation to Place 5  Registration 3  Attention/ Calculation 5  Recall 3  Language- name 2 objects 2  Language- repeat 1  Language- follow 3 step command 3  Language- read & follow direction 1  Write a sentence 1  Copy design 1  Total score 30   Montreal Cognitive Assessment  03/10/2017 03/10/2016  Visuospatial/ Executive (0/5) 5 3  Naming (0/3) 3 3  Attention: Read list of digits (0/2) 2 2  Attention: Read list of letters (0/1) 1 1  Attention: Serial 7 subtraction starting at 100 (0/3) 2 3  Language: Repeat phrase (0/2) 1 1  Language : Fluency (0/1) 1 0  Abstraction (0/2) 2 2  Delayed Recall (0/5) 5 3  Orientation (0/6) 6 6  Total 28 24  Adjusted Score (based on education) - 25   6CIT Screen 09/27/2017  What Year? 0 points  What month? 0 points  What time? 0 points  Count back from 20 0 points  Months in reverse 0 points  Repeat phrase 6 points  Total Score 6    Immunization History  Administered Date(s) Administered  . Pneumococcal Conjugate-13 09/22/2016  . Pneumococcal Polysaccharide-23 07/27/2012  . Td 11/03/2007  . Zoster 10/02/2015    Qualifies for Shingles Vaccine?Discussed and patient will check with pharmacy for coverage.  Patient education handout provided   Screening Tests Health Maintenance  Topic Date Due  . TETANUS/TDAP  08/30/2020 (Originally 11/02/2017)  .  INFLUENZA VACCINE  09/28/2027 (Originally 06/03/2019)  . DEXA SCAN  Completed  . PNA vac Low Risk Adult  Completed    Cancer Screenings: Lung: Low Dose CT Chest recommended if Age 66-80 years, 30 pack-year currently smoking OR have quit w/in 15years. Patient does not qualify. Breast:  Up to date on Mammogram? Yes   Up to date of Bone Density/Dexa? Yes Colorectal: colonoscopy 05/17/19 with  Dr. Watt Climes     Plan:  I have personally reviewed and addressed the Medicare Annual Wellness questionnaire and have noted the following in the patient's chart:  A. Medical and social history B. Use of alcohol, tobacco or illicit drugs  C. Current medications and supplements D. Functional ability and status E.  Nutritional status F.  Physical activity G. Advance directives H. List of other physicians I.  Hospitalizations, surgeries, and ER visits in previous 12 months J.  [place] such as hearing and vision if needed, cognitive and depression L. Referrals, records requested, and appointments- none   In addition, I have reviewed and discussed with patient certain preventive protocols, quality metrics, and best practice recommendations. A written personalized care plan for preventive services as well as general preventive health recommendations were provided to patient.   Signed,  Denman George, LPN  Nurse Health Advisor   Nurse Notes: no additional

## 2019-10-25 ENCOUNTER — Ambulatory Visit: Payer: Medicare HMO

## 2019-10-30 ENCOUNTER — Other Ambulatory Visit: Payer: Self-pay

## 2019-10-30 ENCOUNTER — Encounter: Payer: Self-pay | Admitting: Neurology

## 2019-10-30 ENCOUNTER — Ambulatory Visit (INDEPENDENT_AMBULATORY_CARE_PROVIDER_SITE_OTHER): Payer: Medicare HMO | Admitting: Neurology

## 2019-10-30 VITALS — BP 141/86 | HR 84 | Temp 97.4°F | Ht 63.0 in | Wt 178.0 lb

## 2019-10-30 DIAGNOSIS — G4733 Obstructive sleep apnea (adult) (pediatric): Secondary | ICD-10-CM

## 2019-10-30 DIAGNOSIS — R0689 Other abnormalities of breathing: Secondary | ICD-10-CM

## 2019-10-30 DIAGNOSIS — G478 Other sleep disorders: Secondary | ICD-10-CM | POA: Diagnosis not present

## 2019-10-30 DIAGNOSIS — H9193 Unspecified hearing loss, bilateral: Secondary | ICD-10-CM | POA: Diagnosis not present

## 2019-10-30 DIAGNOSIS — R0982 Postnasal drip: Secondary | ICD-10-CM

## 2019-10-30 DIAGNOSIS — J309 Allergic rhinitis, unspecified: Secondary | ICD-10-CM | POA: Diagnosis not present

## 2019-10-30 DIAGNOSIS — R4701 Aphasia: Secondary | ICD-10-CM

## 2019-10-30 DIAGNOSIS — G2581 Restless legs syndrome: Secondary | ICD-10-CM

## 2019-10-30 DIAGNOSIS — G250 Essential tremor: Secondary | ICD-10-CM

## 2019-10-30 DIAGNOSIS — Z9989 Dependence on other enabling machines and devices: Secondary | ICD-10-CM

## 2019-10-30 NOTE — Patient Instructions (Signed)
[place] resistance Syndrome/ mild OSA    Aphasia Aphasia is a language disorder that affects the part of your brain that you use to communicate. Aphasia does not affect your intelligence, but you may have trouble:  Speaking.  Understanding speech.  Reading.  Writing. Some people with aphasia may also have trouble with memory or attention. Aphasia can happen to anyone at any age, but it is most common in older adults. What are the causes? This condition is caused by damage to the language centers of the brain. Damage may be caused by:  Stroke. This causes a disruption of blood flow to certain areas of the brain. Stroke is the most common cause of aphasia.  Traumatic brain injury (TBI).  Brain tumor.  Infection of the brain tissues.  Nervous system disease that gradually gets worse (progressiveneurological disorder), such as dementia or multiple sclerosis (MS).  Brain surgery. What are the signs or symptoms? Symptoms of this condition include:  Trouble finding the right words.  Difficulty expressing thoughts and needs through speech.  Using the wrong words, nonsense words, or jargon.  Talking in sentences that do not make sense or that are not grammatically correct.  Being unable to repeat back words and phrases.  Difficulty expressing ideas through writing.  Difficulty comprehending when reading.  Being unable to understand other people's speech.  Having trouble understanding numbers. The condition affects people differently. Symptoms may start suddenly or come on gradually, depending on the underlying cause. How is this diagnosed? This condition may be diagnosed based on a screening of your ability to communicate as soon as symptoms start, or are medically stable after a stroke or brain injury. Later, a more comprehensive assessment may be conducted either in the hospital or rehabilitation center. The assessment may test your ability to:  Use speech to  communicate your personal needs.  Use muscles in your mouth and throat for speaking and swallowing.  Express ideas with speech or other means of communication, such as hand gestures.  Make conversation with others across a variety of topics.  Hear and understand speech.  Understand and produce written material.  Manage memory and attention associated with communication. How is this treated? Treatment for this condition depends on your needs and abilities. The goal is to help restore your ability to communicate or find ways to manage communication challenges. Common treatments include:  Speech-language therapy. Part of this may include: ? Re-building intonation, sentence structure, and vocabulary. ? Learning other ways to communicate, such as using word books, communication boards, or special software programs. ? Learning to communicate with writing, sign language, or hand gestures. ? Using a combination of methods to communicate.  Working with family members. This may include: ? Learning ways to communicate. ? Emotional support.  Support groups.  Occupational therapy. This can help to find devices to assist with daily living activities. Treatment usually begins as soon as possible. It may begin while you are in the hospital and continue in a rehabilitation center or at home. In some cases, aphasia may improve quickly on its own. In other cases, recovery occurs more slowly over time. Follow these instructions at home:   Keep all follow-up visits as told by your health care provider. This is important.  Make sure you have a good support system at home.  Find a support group. This can help you connect with others who are going through the same thing.  Try the following tips while communicating: ? Use short, simple sentences. Ask  family members to do the same. Sentences that require one-word or short answers are easiest. ? Avoid distractions like background noise when trying to  listen or talk. ? Try communicating with gestures, pointing, writing, or drawing. ? Talk slowly. Ask family members to talk to you slowly. ? Maintain eye contact when communicating. Ask family members to do the same when communicating with you. ? Ask family members to give you time to respond or to engage in conversation with them. Contact a health care provider if:  Your symptoms change or get worse.  You are struggling with anxiety or depression. Get help right away if you have:  Any symptoms of a stroke. "BE FAST" is an easy way to remember the main warning signs of a stroke: ? B - Balance. Signs are dizziness, sudden trouble walking, or loss of balance. ? E - Eyes. Signs are trouble seeing or a sudden change in vision. ? F - Face. Signs are a sudden weakness or numbness of the face, or the face or eyelid drooping on one side. ? A - Arms. Signs are weakness or numbness in an arm. This happens suddenly and usually on one side of the body. ? S - Speech. Signs are sudden trouble speaking, slurred speech, or trouble understanding what people say. ? T - Time. Time to call emergency service. Write down what time symptoms started.  Other signs of a stroke, such as: ? A sudden, severe headache with no known cause. ? Nausea or vomiting. ? Seizure. Summary  Aphasia is a language disorder that results from damage to the part of your brain that helps you use language to communicate. Aphasia does not affect your intelligence, but may cause difficulty with speech, writing, reading, or understanding others.  In some cases, aphasia may improve quickly on its own. In other cases, recovery occurs more slowly over time.  Get help right away if you have symptoms of a stroke. This information is not intended to replace advice given to you by your health care provider. Make sure you discuss any questions you have with your health care provider. Document Released: 07/11/2002 Document Revised: 11/19/2017  Document Reviewed: 11/16/2017 Elsevier Patient Education  2020 [employer].

## 2019-10-30 NOTE — Progress Notes (Signed)
[facility]  Provider:  Larey Seat, M D  Referring Provider: Harrison Mons, PA Primary Care Physician:  Vivi Barrack, MD  Chief Complaint  Patient presents with  . Follow-up    pt alone, rm 11. pt states that her machine is 77 yrs old and she is interested getting a new machine. DME [facility]    HPI:   Nicole Holt is a 78 y.o. female seen here as a an established patient with OSA pn CPAP on RV 10-30-2019. She is hard of hearing and would rather me using a face-shield. She reports a lot of broken veins in her legs. I encouraged her to see PCP for that. Her speech has been less fluent , more stammering.  She has been listening to great courses on CD ROM, established a day by day routine.  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  She became tearful when she spoke about lonely Christmas. Her sister has PD, and is ' pitiful" in declining health. Her grandson, whom she adores is now 63 years old and came over on Christmas Day. She failed on social distancing by her own accord. At the [place] is born on [birth year], she is using a C-Flex [employer] machine her download for the last 30 days shows 100% compliance no day without use of an average use at time of 9 hours 29 minutes and 59 seconds.  Average time in airleak is 2 minutes at night her average AHI is 0.7 and her CPAP is set at the lowest possible pressure of 5 cm water.  The geriatric depression score was endorsed at 8 out of 15 points indicative of clinical depression, her Epworth Sleepiness Scale was endorsed at 6 out of 24 points and she does not feel necessarily fatigue but she acknowledges that she has a hard time getting out of bed in the morning. The CPAP machine was issued 09-27-2012.       CD -Revisit on 03-14-2018 She was originally sent by PA  Jacqulynn Cadet for OSA work up, now followed for CPAP compliance.  I had not seen Nicole Holt since 2017 and in the meantime she has  followed with our nurse practitioner here for regular compliance checks.  Today she endorsed the fatigue severity score of 12 points, the Epworth sleepiness score is 3 points, the geriatric depression score at 0 points.  Her C-Flex machine has been followed by Huey Romans And she received her machine  5.5 years ago and could get a new one. Has one nocturia at 3 AM. Sleeps 6-7 hours at night. No dry mouth nor headaches. No choking or snoring.   This CPAP still works, she has been highly compliant, 100% with a average use of time on all days of 8 hours and 50 minutes nightly, she has a residual AHI of only 0.6 with a CPAP setting of only 5 cm water.  This patient had a very mild apnea and mainly upper airway resistance syndrome which is treated with low pressure CPAP.  She never required oxygen, and she has been feeling less sleepy and less fatigued, clearly stating a benefit of CPAP therapy. She has still sinus post nasal drip.     Nicole Holt originally presented upon referral of Dr. Laney Pastor in 2013 . She is a right handed , chronically hoarse ,caucasian female patient , who  had reportedly loudly snoring and often woke herself from her snoring. She also woke up with a  dry mouth and felt not  rested or restored in the mornings.  She gained weight , became less active.  Prior to her sleep evaluation she endorsed getting 6-7 hours of nocturnal sleep. She also has suffered from allergic rhinitis and used an Teaching laboratory technician. Her grandchildren had reportedly been bothered by her loud snoring , which made her seek medical advice. She endorsed the Epworth score at 14 of 21 points at the time ( 2013) . The patient's sleep study in November 2013 documented AHI of 9.2 and RDI of 18, not SPLIT -  but she was hypoxic for over one hour at night.  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  She returned for a CPAP auto- titration and  seemed to respond best to only 5 cm water pressure. 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 As she started on CPAP later that year , her rhinitis improved and she had no further sinusitis exacerbations.  She goes to bed at 11 Pm and falls asleep within 5 minutes, has a bathroom break at 2.30 AM and rarely another at 4.30. Unless she has a lot on her mind, she will easily fall asleep again.  Total sleep time is now 7-8 hours nightly, no daytime naps.  Her Epworth sleepiness score had been reduced after she initiated CPAP he was and is currently at 6 points her fatigue severity score it on the 18 points , both greatly reduced. . Her geriatric depression score was at 3 points,  not indicative of depression.  Unable today to review the patient's compliance record she has 100% compliance on 180 date download, dated 02-02-2014. The nightly use of 7 hours 55 minutes with CPAP her residual AHI is 0.9 at that but CPAP pressure of 5 cm water. She loves her CPAP ! No neck , face or ENT surgery history .   Interval history since the patient underwent a CPAP titration on 09-27-12 as been followed yearly in our office.  Today is 03-12-15, the patient is currently under [facility] coverage and uses the only available DME, preop. She has a CPAP machine by [employer] system 1 with heated humidifier,  coiled air tubing and uses a S-M size nasal mask. She has still other size liners, but only this one fits.  She needs to reorder this size through upper area. Her compliance information is available today downloaded in office on with a date 03-11-15 she's 100% compliant forward 30 out of 30 days of use 100% for over 4 hours of daily use with an average user time of 8 hours and 17 minutes. Her average AHI is 0.8 and a CPAP pressure of only 5 cm water. Her speech is  non fluent at the beginning of today's visit, but improved. She looks for words.   Review of Systems: Out of a complete 14 system review, the patient complains of only the following symptoms, and all other reviewed systems are negative. Her speech is non fluent at the beginning of today's visit, but improved. She looks for words.  She writes fluently.   UARS - mild OSA- can't sleep without CPAP.    Her 49 year old sister suffers from Parkinson's disease, falls all the time.   Today she endorsed the Epworth  sleepiness score is 1 points,  the geriatric depression score at 0 points.   Her C-Flex machine has been followed by Huey Romans    Social History   Socioeconomic History  . Marital status: Married    Spouse name: Legrand Como  . Number of children: 2  . Years of education: 38  . Highest education level: Some college, no degree  Occupational History  . Occupation: Retired  Tobacco Use  . Smoking status: Former Research scientist (life sciences)  . Smokeless tobacco: Never Used  . Tobacco comment: Quit in 1987  Substance and Sexual Activity  . Alcohol use: Yes    Alcohol/week: 12.0 standard drinks    Types: 12 Glasses of wine per week    Comment: wine or alcohol daily  . Drug use: No  . Sexual activity: Yes  Other Topics Concern  . Not on file  Social History Narrative   Patient is married Legrand Como) and lives with her husband.   Patient is retired.   Patient has two children.   Patient is right-handed.   Patient has a college education.   Patient drinks two cups of coffee daily.            Social Determinants of Health   Financial Resource Strain:   . Difficulty of Paying Living Expenses: Not on file  Food Insecurity:   . Worried About Charity fundraiser in the Last Year: Not on file  . Ran Out of Food in the Last Year: Not on file  Transportation Needs:   . Lack of Transportation (Medical): Not on file  . Lack of Transportation (Non-Medical): Not on file  Physical Activity:   . Days of  Exercise per Week: Not on file  . Minutes of Exercise per Session: Not on file  Stress:   . Feeling of Stress : Not on file  Social Connections:   . Frequency of Communication with Friends and Family: Not on file  . Frequency of Social Gatherings with Friends and Family: Not on file  . Attends Religious Services: Not on file  . Active Member of Clubs or Organizations: Not on file  . Attends Archivist Meetings: Not on file  . Marital Status: Not on file  Intimate Partner Violence:   . Fear of Current or Ex-Partner: Not on file  . Emotionally Abused: Not on file  . Physically Abused: Not on file  . Sexually Abused: Not on file    Family History  Problem Relation Age of Onset  . Osteoporosis Mother   . Stroke Mother   . Hyperlipidemia Mother   . Hypertension Mother   . Kidney disease Father   . Congestive Heart Failure Father   . Heart disease Father   . Hyperlipidemia Father   . Stroke Sister   . Parkinson's disease Sister   . Heart disease Sister   . Heart disease Maternal Grandmother   . Gout Maternal Grandmother   . Stroke Maternal Grandfather   . Stroke Paternal Grandmother   . Thrombosis Paternal Grandfather   . Early death Paternal Grandfather   . Hypertension Brother   . Diabetes Brother   . Arthritis/Rheumatoid Other   . Parkinson's disease Other   . Heart disease Other   . Obesity Daughter   . Breast cancer Neg Hx     Past Medical History:  Diagnosis Date  . Allergy   . Anxiety    panic attack  . Cataract   . Depression   . GERD (gastroesophageal reflux  disease)   . Glaucoma   . Glaucoma   . Hearing loss   . 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 Hyperlipidemia   . Migraines    ocular  . OSA on CPAP 02/02/2014   PSG 09-27-2012 RDI 18.9 and AHi 9.8, Epworth 15 .titrated to only 5 cm water.   . Osteoporosis   . Sleep apnea   . Tinnitus     Past Surgical History:  Procedure Laterality Date  . cataract surgery     . COSMETIC SURGERY      Current Outpatient Medications  Medication Sig Dispense Refill  . calcium-vitamin D (OSCAL WITH D) 500-200 MG-UNIT tablet Take 2 tablets by mouth daily with breakfast.    . diazepam (VALIUM) 5 MG tablet Take 1 tablet (5 mg total) by mouth every 6 (six) hours as needed. for anxiety 30 tablet 0  . diclofenac (VOLTAREN) 75 MG EC tablet Take 1 tablet (75 mg total) by mouth 2 (two) times daily. 30 tablet 0  . fluticasone (FLONASE) 50 MCG/ACT nasal spray Place 2 sprays into both nostrils daily. 16 g 12  . Melatonin 10 MG TABS Take by mouth.    . Multiple Vitamins-Minerals (CENTRUM ADULTS PO) Take by mouth.    . Omega-3 Fatty Acids (FISH OIL PO) Take by mouth.    . Travoprost, BAK Free, (TRAVATAN) 0.004 % SOLN ophthalmic solution Place 1 drop into both eyes at bedtime.    . Turmeric (QC TUMERIC COMPLEX PO) Take by mouth.     No current facility-administered medications for this visit.    Allergies as of 10/30/2019 - Review Complete 10/30/2019  Allergen Reaction Noted  . Codeine Nausea Only 01/18/2012  . Estrogens conjugated Other (See Comments) 01/18/2012  . Paroxetine hcl Other (See Comments) 01/18/2012    Vitals: BP (!) 141/86   Pulse 84   Temp (!) 97.4 F (36.3 C)   Ht 5\' 3"  (1.6 m)   Wt 178 lb (80.7 kg)   BMI 31.53 kg/m  Last Weight:  Wt Readings from Last 1 Encounters:  10/30/19 178 lb (80.7 kg)   Last Height:   Ht Readings from Last 1 Encounters:  10/30/19 5\' 3"  (1.6 m)    Physical exam:  General: The patient is awake, alert and appears not in acute distress. The patient is well groomed. Has dysphonia.  Head: Normocephalic, atraumatic.  Neck is supple. Mallampati 2 , neck circumference:15.0, has still mild  tongue tremor,  jaw tremor, tremulous speech.  Cardiovascular:  Regular rate and rhythm, without murmurs or carotid bruit, and without distended neck veins. Respiratory: Lungs are clear to auscultation.Skin: tanned,  Without evidence of  edema, or rash. Facial rosacea.  Trunk: BMI is elevated. This patient has normal posture.  Neurologic exam : The patient is awake and alert, oriented to place and time.  There is a normal attention span & concentration ability.  Speech is non- fluent, she struggles today with fluency,  is mid-sentence lost for words.  Mood and affect are outgoing, but volatile. She feels easily in tears. She is easily choked up.  Cranial nerves: Pupils are reactive to light. The patient just underwent a left-sided lensectomy.  She does have a slightly disrounded left pupil - wears no longer corrective lenses which allows her to not wear glasses. Funduscopic exam without evidence of pallor or edema.  Extraocular movements  in vertical and horizontal planes intact and without nystagmus.  Visual fields by finger perimetry  are intact. Hearing corrected by hearing aids bilaterally- she speaks loudly.  Facial motor strength is symmetric. Motor exam:  Normal tone, muscle bulk and symmetric normal strength in all extremities. Sensory:  Fine touch, pinprick and vibration were normal. Coordination: . Finger-to-nose maneuver tested and with a mild  tremor.  Gait and station: Patient walks without assistive device. Strength within normal limits.  Deep tendon reflexes: in the  upper and lower extremities are symmetric and intact. Babinski maneuver response is  downgoing.   Assessment:  After physical and neurologic examination, review of laboratory studies, imaging, neurophysiology testing and pre-existing records, assessment is   1) Mild OSA with UARS.  CPAP is years old  This condition is well controlled on the current CPAP setting of only  5 cm.  100% compliance, well controlled AHI under 1 ! .  Humidifier can be self adjusted.   2) weight has been stabile, lost a few pounds.   BMI 30.9  3) some delay in word finding - delayed , not a "blank"  Stammering.  Expressive aphasia ? rather different. Thi is more a  stammering.     Plan:  Treatment plan and additional workup : Continue CPAP  With a new machine autotitration capable. at 5 cm water, for UARS and  mild OSA,.   RV in 3 month with now CPAP machine ,  I will order a new machine. Since her last sdianosis was for mild apnea and UARS and medicare doesn't acknowledge UARS as a daignisis any longer, I rather order a new machien without new Test.  Next visit with [place] please.   [place] had been suggested for this visits too.  Patient declined speech therapy.   Larey Seat, MD   10-30-2019 Medical Director of [facility] at [facility],  Fellow of the AASM, ABPN and ABSM.

## 2019-11-07 ENCOUNTER — Other Ambulatory Visit: Payer: Self-pay

## 2019-11-08 ENCOUNTER — Ambulatory Visit (INDEPENDENT_AMBULATORY_CARE_PROVIDER_SITE_OTHER): Payer: Medicare HMO | Admitting: Family Medicine

## 2019-11-08 ENCOUNTER — Encounter: Payer: Self-pay | Admitting: Family Medicine

## 2019-11-08 VITALS — BP 136/78 | HR 64 | Temp 98.2°F | Ht 62.0 in | Wt 178.2 lb

## 2019-11-08 DIAGNOSIS — Z0001 Encounter for general adult medical examination with abnormal findings: Secondary | ICD-10-CM

## 2019-11-08 DIAGNOSIS — F338 Other recurrent depressive disorders: Secondary | ICD-10-CM | POA: Insufficient documentation

## 2019-11-08 DIAGNOSIS — H9202 Otalgia, left ear: Secondary | ICD-10-CM

## 2019-11-08 DIAGNOSIS — E785 Hyperlipidemia, unspecified: Secondary | ICD-10-CM | POA: Diagnosis not present

## 2019-11-08 DIAGNOSIS — F419 Anxiety disorder, unspecified: Secondary | ICD-10-CM | POA: Diagnosis not present

## 2019-11-08 DIAGNOSIS — R739 Hyperglycemia, unspecified: Secondary | ICD-10-CM

## 2019-11-08 LAB — COMPREHENSIVE METABOLIC PANEL
ALT: 21 U/L (ref 0–35)
AST: 22 U/L (ref 0–37)
Albumin: 4.2 g/dL (ref 3.5–5.2)
Alkaline Phosphatase: 62 U/L (ref 39–117)
BUN: 14 mg/dL (ref 6–23)
CO2: 25 mEq/L (ref 19–32)
Calcium: 9.2 mg/dL (ref 8.4–10.5)
Chloride: 105 mEq/L (ref 96–112)
Creatinine, Ser: 0.88 mg/dL (ref 0.40–1.20)
GFR: 62.24 mL/min (ref >60.00)
Glucose, Bld: 102 mg/dL — ABNORMAL HIGH (ref 70–99)
Potassium: 4.2 mEq/L (ref 3.5–5.1)
Sodium: 140 mEq/L (ref 135–145)
Total Bilirubin: 0.5 mg/dL (ref 0.2–1.2)
Total Protein: 6.7 g/dL (ref 6.0–8.3)

## 2019-11-08 LAB — LIPID PANEL
Cholesterol: 187 mg/dL (ref 0–200)
HDL: 57 mg/dL (ref >39.00)
LDL Cholesterol: 102 mg/dL — ABNORMAL HIGH (ref 0–99)
NonHDL: 129.55
Total CHOL/HDL Ratio: 3
Triglycerides: 137 mg/dL (ref 0.0–149.0)
VLDL: 27.4 mg/dL (ref 0.0–40.0)

## 2019-11-08 LAB — CBC
HCT: 42.3 % (ref 36.0–46.0)
Hemoglobin: 14.1 g/dL (ref 12.0–15.0)
MCHC: 33.2 g/dL (ref 30.0–36.0)
MCV: 94.7 fl (ref 78.0–100.0)
Platelets: 269 10*3/uL (ref 150.0–400.0)
RBC: 4.47 Mil/uL (ref 3.87–5.11)
RDW: 14.3 % (ref 11.5–15.5)
WBC: 5.7 10*3/uL (ref 4.0–10.5)

## 2019-11-08 LAB — TSH: TSH: 1.69 u[IU]/mL (ref 0.35–4.50)

## 2019-11-08 LAB — HEMOGLOBIN A1C: Hgb A1c MFr Bld: 5.8 % (ref 4.6–6.5)

## 2019-11-08 MED ORDER — DIAZEPAM 5 MG PO TABS: 5.0000 mg | ORAL_TABLET | Freq: Four times a day (QID) | ORAL | 0 refills | Status: DC | PRN

## 2019-11-08 NOTE — Assessment & Plan Note (Signed)
Check lipids, CBC, C met, TSH.  Continue lifestyle modifications.

## 2019-11-08 NOTE — Patient Instructions (Signed)
It was very nice to see you today!  We will check blood work today.  You can use Flonase daily if needed.  Come back in 6 to 12 months, or sooner if needed.  Take care, Dr Jerline Pain  Please try these tips to maintain a healthy lifestyle:   Eat at least 3 REAL meals and 1-2 snacks per day.  Aim for no more than 5 hours between eating.  If you eat breakfast, please do so within one hour of getting up.    Each meal should contain half fruits/vegetables, one quarter protein, and one quarter carbs (no bigger than a computer mouse)   Cut down on sweet beverages. This includes juice, soda, and sweet tea.     Drink at least 1 glass of water with each meal and aim for at least 8 glasses per day   Exercise at least 150 minutes every week.    Preventive Care 78 Years and Older, Female Preventive care refers to lifestyle choices and visits with your health care provider that can promote health and wellness. This includes:  A yearly physical exam. This is also called an annual well check.  Regular dental and eye exams.  Immunizations.  Screening for certain conditions.  Healthy lifestyle choices, such as diet and exercise. What can I expect for my preventive care visit? Physical exam Your health care provider will check:  Height and weight. These may be used to calculate body mass index (BMI), which is a measurement that tells if you are at a healthy weight.  Heart rate and blood pressure.  Your skin for abnormal spots. Counseling Your health care provider may ask you questions about:  Alcohol, tobacco, and drug use.  Emotional well-being.  Home and relationship well-being.  Sexual activity.  Eating habits.  History of falls.  Memory and ability to understand (cognition).  Work and work Statistician.  Pregnancy and menstrual history. What immunizations do I need?  Influenza (flu) vaccine  This is recommended every year. Tetanus, diphtheria, and pertussis  (Tdap) vaccine  You may need a Td booster every 10 years. Varicella (chickenpox) vaccine  You may need this vaccine if you have not already been vaccinated. Zoster (shingles) vaccine  You may need this after age 36. Pneumococcal conjugate (PCV13) vaccine  One dose is recommended after age 44. Pneumococcal polysaccharide (PPSV23) vaccine  One dose is recommended after age 20. Measles, mumps, and rubella (MMR) vaccine  You may need at least one dose of MMR if you were born in 1957 or later. You may also need a second dose. Meningococcal conjugate (MenACWY) vaccine  You may need this if you have certain conditions. Hepatitis A vaccine  You may need this if you have certain conditions or if you travel or work in places where you may be exposed to hepatitis A. Hepatitis B vaccine  You may need this if you have certain conditions or if you travel or work in places where you may be exposed to hepatitis B. Haemophilus influenzae type b (Hib) vaccine  You may need this if you have certain conditions. You may receive vaccines as individual doses or as more than one vaccine together in one shot (combination vaccines). Talk with your health care provider about the risks and benefits of combination vaccines. What tests do I need? Blood tests  Lipid and cholesterol levels. These may be checked every 5 years, or more frequently depending on your overall health.  Hepatitis C test.  Hepatitis B test. Screening  Lung cancer screening. You may have this screening every year starting at age 49 if you have a 30-pack-year history of smoking and currently smoke or have quit within the past 15 years.  Colorectal cancer screening. All adults should have this screening starting at age 68 and continuing until age 55. Your health care provider may recommend screening at age 38 if you are at increased risk. You will have tests every 1-10 years, depending on your results and the type of screening  test.  Diabetes screening. This is done by checking your blood sugar (glucose) after you have not eaten for a while (fasting). You may have this done every 1-3 years.  Mammogram. This may be done every 1-2 years. Talk with your health care provider about how often you should have regular mammograms.  BRCA-related cancer screening. This may be done if you have a family history of breast, ovarian, tubal, or peritoneal cancers. Other tests  Sexually transmitted disease (STD) testing.  Bone density scan. This is done to screen for osteoporosis. You may have this done starting at age 16. Follow these instructions at home: Eating and drinking  Eat a diet that includes fresh fruits and vegetables, whole grains, lean protein, and low-fat dairy products. Limit your intake of foods with high amounts of sugar, saturated fats, and salt.  Take vitamin and mineral supplements as recommended by your health care provider.  Do not drink alcohol if your health care provider tells you not to drink.  If you drink alcohol: ? Limit how much you have to 0-1 drink a day. ? Be aware of how much alcohol is in your drink. In the [place] one drink equals one 12 oz bottle of beer (355 mL), one 5 oz glass of wine (148 mL), or one 1 oz glass of hard liquor (44 mL). Lifestyle  Take daily care of your teeth and gums.  Stay active. Exercise for at least 30 minutes on 5 or more days each week.  Do not use any products that contain nicotine or tobacco, such as cigarettes, e-cigarettes, and chewing tobacco. If you need help quitting, ask your health care provider.  If you are sexually active, practice safe sex. Use a condom or other form of protection in order to prevent STIs (sexually transmitted infections).  Talk with your health care provider about taking a low-dose aspirin or statin. What's next?  Go to your health care provider once a year for a well check visit.  Ask your health care provider how often you  should have your eyes and teeth checked.  Stay up to date on all vaccines. This information is not intended to replace advice given to you by your health care provider. Make sure you discuss any questions you have with your health care provider. Document Revised: 10/13/2018 Document Reviewed: 10/13/2018 Elsevier Patient Education  2020 [employer].

## 2019-11-08 NOTE — Assessment & Plan Note (Signed)
See above.  Mood is slightly worsened.  She feels like she is managing well.

## 2019-11-08 NOTE — Assessment & Plan Note (Signed)
Slightly worsened since last visit.  Would like to continue Valium 5 mg 3 times daily as needed.  Does not want to start additional medication at this point.

## 2019-11-08 NOTE — Assessment & Plan Note (Signed)
Check A1c. 

## 2019-11-08 NOTE — Progress Notes (Signed)
Chief Complaint:  Nicole Holt is a 79 y.o. female who presents today for her annual comprehensive physical exam.    Assessment/Plan:  Acute Issues: Eustachian tube dysfunction No red flags.  Recommended daily use of Flonase.  Chronic Problems Addressed Today: Anxiety Slightly worsened since last visit.  Would like to continue Valium 5 mg 3 times daily as needed.  Does not want to start additional medication at this point.  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  Mood is slightly worsened.  She feels like she is managing well.  Hyperglycemia Check A1c.  Dyslipidemia Check lipids, CBC, C met, TSH.  Continue lifestyle modifications.  Preventative Healthcare: Discussed Covid vaccine.  Up-to-date on other vaccines and immunizations.  Patient Counseling(The following topics were reviewed and/or handout was given):  -Nutrition: Stressed importance of moderation in sodium/caffeine intake, saturated fat and cholesterol, caloric balance, sufficient intake of fresh fruits, vegetables, and fiber.  -Stressed the importance of regular exercise.   -Substance Abuse: Discussed cessation/primary prevention of tobacco, alcohol, or other drug use; driving or other dangerous activities under the influence; availability of treatment for abuse.   -Injury prevention: Discussed safety belts, safety helmets, smoke detector, smoking near bedding or upholstery.   -Sexuality: Discussed sexually transmitted diseases, partner selection, use of condoms, avoidance of unintended pregnancy and contraceptive alternatives.   -Dental health: Discussed importance of regular tooth brushing, flossing, and dental visits.  -Health maintenance and immunizations reviewed. Please refer to Health maintenance section.  Return to care in 1 year for next preventative visit.     Subjective:  HPI:  She has no acute complaints today.   Anxiety is worsened slightly.  Usually worsens during the winter months.  She has  been managing well.  She is also had some congestion in her left ear and left sinuses.  She has been using Flonase with modest improvement.  Lifestyle Diet: Balanced. Plenty of fruits and vegetables.  Exercise: Likes to walk.   Depression screen [facility] 2/9 10/23/2019  Decreased Interest 0  Down, Depressed, Hopeless 0  PHQ - 2 Score 0  Altered sleeping -  Tired, decreased energy -  Change in appetite -  Feeling bad or failure about yourself  -  Trouble concentrating -  Moving slowly or fidgety/restless -  Suicidal thoughts -  PHQ-9 Score -  Difficult doing work/chores -   There are no preventive care reminders to display for this patient.   PMH:  The following were reviewed and entered/updated in epic: Past Medical History:  Diagnosis Date  . Allergy   . Anxiety    panic attack  . Cataract   . Depression   . GERD (gastroesophageal reflux disease)   . Glaucoma   . Glaucoma   . Hearing loss   . 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 Hyperlipidemia   . Migraines    ocular  . OSA on CPAP 02/02/2014   PSG 09-27-2012 RDI 18.9 and AHi 9.8, Epworth 15 .titrated to only 5 cm water.   . Osteoporosis   . Sleep apnea   . Tinnitus    Patient Active Problem List   Diagnosis Date Noted  . 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 Aphasia determined by examination 10/30/2019  . PND (post-nasal drip) 10/30/2019  . Sleep related choking sensation 10/30/2019  . Insomnia 08/31/2019  . Allergic rhinitis 04/24/2019  . Hyperglycemia 10/19/2018  . Constipation 10/14/2018  . Rectal bleeding 10/14/2018  . Dyslipidemia 05/24/2018  .  Abnormal mammogram of right breast 09/22/2016  . Rosacea 09/18/2015  . S/P cataract surgery 09/18/2015  . OSA on CPAP 02/02/2014  . Tremor, essential 02/02/2014  . UARS (upper airway resistance syndrome) 02/02/2014  . Osteoporosis 07/28/2012  . Glaucoma 07/28/2012  . Restless leg syndrome 07/28/2012  . Anxiety 07/28/2012    . Benign positional vertigo 07/28/2012  . Tinnitus 07/27/2012  . Hearing loss 07/27/2012  . Basal cell cancer 07/27/2012   Past Surgical History:  Procedure Laterality Date  . cataract surgery    . COSMETIC SURGERY      Family History  Problem Relation Age of Onset  . Osteoporosis Mother   . Stroke Mother   . Hyperlipidemia Mother   . Hypertension Mother   . Kidney disease Father   . Congestive Heart Failure Father   . Heart disease Father   . Hyperlipidemia Father   . Stroke Sister   . Parkinson's disease Sister   . Heart disease Sister   . Heart disease Maternal Grandmother   . Gout Maternal Grandmother   . Stroke Maternal Grandfather   . Stroke Paternal Grandmother   . Thrombosis Paternal Grandfather   . Early death Paternal Grandfather   . Hypertension Brother   . Diabetes Brother   . Arthritis/Rheumatoid Other   . Parkinson's disease Other   . Heart disease Other   . Obesity Daughter   . Breast cancer Neg Hx     Medications- reviewed and updated Current Outpatient Medications  Medication Sig Dispense Refill  . calcium-vitamin D (OSCAL WITH D) 500-200 MG-UNIT tablet Take 2 tablets by mouth daily with breakfast.    . diazepam (VALIUM) 5 MG tablet Take 1 tablet (5 mg total) by mouth every 6 (six) hours as needed. for anxiety 30 tablet 0  . diclofenac (VOLTAREN) 75 MG EC tablet Take 1 tablet (75 mg total) by mouth 2 (two) times daily. 30 tablet 0  . fluticasone (FLONASE) 50 MCG/ACT nasal spray Place 2 sprays into both nostrils daily. 16 g 12  . Melatonin 10 MG TABS Take by mouth.    . Multiple Vitamins-Minerals (CENTRUM ADULTS PO) Take by mouth.    . Omega-3 Fatty Acids (FISH OIL PO) Take by mouth.    . Travoprost, BAK Free, (TRAVATAN) 0.004 % SOLN ophthalmic solution Place 1 drop into both eyes at bedtime.    . Turmeric (QC TUMERIC COMPLEX PO) Take by mouth.     No current facility-administered medications for this visit.    Allergies-reviewed and  updated Allergies  Allergen Reactions  . Codeine Nausea Only  . Estrogens Conjugated Other (See Comments)    HA  . Paroxetine Hcl Other (See Comments)    Felt like she was "out of her head"    Social History   Socioeconomic History  . Marital status: Married    Spouse name: Legrand Como  . Number of children: 2  . Years of education: 79  . Highest education level: Some college, no degree  Occupational History  . Occupation: Retired  Tobacco Use  . Smoking status: Former Research scientist (life sciences)  . Smokeless tobacco: Never Used  . Tobacco comment: Quit in 1987  Substance and Sexual Activity  . Alcohol use: Yes    Alcohol/week: 12.0 standard drinks    Types: 12 Glasses of wine per week    Comment: wine or alcohol daily  . Drug use: No  . Sexual activity: Yes  Other Topics Concern  . Not on file  Social History Narrative  Patient is married Legrand Como) and lives with her husband.   Patient is retired.   Patient has two children.   Patient is right-handed.   Patient has a college education.   Patient drinks two cups of coffee daily.            Social Determinants of Health   Financial Resource Strain:   . Difficulty of Paying Living Expenses: Not on file  Food Insecurity:   . Worried About Charity fundraiser in the Last Year: Not on file  . Ran Out of Food in the Last Year: Not on file  Transportation Needs:   . Lack of Transportation (Medical): Not on file  . Lack of Transportation (Non-Medical): Not on file  Physical Activity:   . Days of Exercise per Week: Not on file  . Minutes of Exercise per Session: Not on file  Stress:   . Feeling of Stress : Not on file  Social Connections:   . Frequency of Communication with Friends and Family: Not on file  . Frequency of Social Gatherings with Friends and Family: Not on file  . Attends Religious Services: Not on file  . Active Member of Clubs or Organizations: Not on file  . Attends Archivist Meetings: Not on file  .  Marital Status: Not on file        Objective:  Physical Exam: BP 136/78   Pulse 64   Temp 98.2 F (36.8 C)   Ht 5' 2"  (1.575 m)   Wt 178 lb 4 oz (80.9 kg)   SpO2 96%   BMI 32.60 kg/m   Body mass index is 32.6 kg/m. Wt Readings from Last 3 Encounters:  11/08/19 178 lb 4 oz (80.9 kg)  10/30/19 178 lb (80.7 kg)  10/23/19 176 lb 6.4 oz (80 kg)   Gen: NAD, resting comfortably HEENT: Bilateral TMs with clear effusion.  Nose mucosa slightly erythematous with clear discharge.  OP clear. No thyromegaly noted.  CV: RRR with no murmurs appreciated Pulm: NWOB, CTAB with no crackles, wheezes, or rhonchi GI: Normal bowel sounds present. Soft, Nontender, Nondistended. MSK: no edema, cyanosis, or clubbing noted Skin: warm, dry Neuro: CN2-12 grossly intact. Strength 5/5 in upper and lower extremities. Reflexes symmetric and intact bilaterally.  Psych: Normal affect and thought content     Khush Pasion M. Jerline Pain, MD 11/08/2019 11:55 AM

## 2019-11-09 NOTE — Progress Notes (Signed)
Please inform patient of the following:  Cholesterol and blood sugar levels are both borderline but stable. Do not need to start any additional medications at this point. Recommend that she continue to work on diet and exercise and we can recheck in a year or so.

## 2019-11-13 DIAGNOSIS — G4733 Obstructive sleep apnea (adult) (pediatric): Secondary | ICD-10-CM | POA: Diagnosis not present

## 2019-11-15 DIAGNOSIS — H401132 Primary open-angle glaucoma, bilateral, moderate stage: Secondary | ICD-10-CM | POA: Diagnosis not present

## 2019-11-15 DIAGNOSIS — H2511 Age-related nuclear cataract, right eye: Secondary | ICD-10-CM | POA: Diagnosis not present

## 2019-11-15 DIAGNOSIS — H04221 Epiphora due to insufficient drainage, right lacrimal gland: Secondary | ICD-10-CM | POA: Diagnosis not present

## 2019-11-15 DIAGNOSIS — H43813 Vitreous degeneration, bilateral: Secondary | ICD-10-CM | POA: Diagnosis not present

## 2019-12-14 DIAGNOSIS — G4733 Obstructive sleep apnea (adult) (pediatric): Secondary | ICD-10-CM | POA: Diagnosis not present

## 2019-12-22 ENCOUNTER — Ambulatory Visit: Payer: Medicare HMO | Attending: Neurology

## 2019-12-22 ENCOUNTER — Other Ambulatory Visit: Payer: Self-pay

## 2019-12-22 DIAGNOSIS — R498 Other voice and resonance disorders: Secondary | ICD-10-CM | POA: Diagnosis not present

## 2019-12-22 DIAGNOSIS — R4701 Aphasia: Secondary | ICD-10-CM | POA: Diagnosis not present

## 2019-12-22 NOTE — Therapy (Signed)
[name] [address] [place], [place], [address] Phone: [phone]   Fax:  [phone]  Speech Language Pathology Evaluation  Patient Details  Name: Nicole Holt MRN: [id number] Date of Birth: 09/09/1941 Referring Provider (SLP): Dohmeier, Asencion Partridge, MD   Encounter Date: 12/22/2019  End of Session - 12/22/19 1219    Visit Number  1    Number of Visits  1    Date for SLP Re-Evaluation  12/22/19    SLP Start Time  10    SLP Stop Time   1140    SLP Time Calculation (min)  50 min    Activity Tolerance  Patient tolerated treatment well       Past Medical History:  Diagnosis Date  . Allergy   . Anxiety    panic attack  . Cataract   . Depression   . GERD (gastroesophageal reflux disease)   . Glaucoma   . Glaucoma   . Hearing loss   . 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 Hyperlipidemia   . Migraines    ocular  . OSA on CPAP 02/02/2014   PSG 09-27-2012 RDI 18.9 and AHi 9.8, Epworth 15 .titrated to only 5 cm water.   . Osteoporosis   . Sleep apnea   . Tinnitus     Past Surgical History:  Procedure Laterality Date  . cataract surgery    . COSMETIC SURGERY      There were no vitals filed for this visit.  Subjective Assessment - 12/22/19 1056    Subjective  "Dr. Brett Fairy mentioned my speech and I saw 'aphasia'. I mentioned I had performance anxiety and forget certain words."         SLP Evaluation [facility] - 12/22/19 1057      SLP Visit Information   SLP Received On  12/22/19    Referring Provider (SLP)  Dohmeier, Asencion Partridge, MD    Onset Date  pt unsure of onset date    Medical Diagnosis  Aphasia       Subjective   Patient/Family Stated Goal  "Im not really sure why I'm here."      General Information   HPI  Pt with hx of mental trauma (e.g., husband committing suicide in front of her, difficult childhood)and two remote falls in the past - pt questions if these remote TBIs  are the cause of any word-finding issues. Pt reports that she is normally anxious when seeing Dr. Brett Fairy and this translates into her voice shaking.       Prior Functional Status   Cognitive/Linguistic Baseline  Within functional limits    Type of Home  House     Lives With  Spouse    Available Support  Family    Vocation  Retired      Associate Professor   Overall Cognitive Status  Within Functional Limits for tasks assessed    Behaviors  Lability;Restless   Pt frequently tearful during evaluation     Auditory Comprehension   Overall Auditory Comprehension  Appears within functional limits for tasks assessed      Verbal Expression   Overall Verbal Expression  Appears within functional limits for tasks assessed    Other Verbal Expression Comments  Pt had three word finding episodes in 40-minute evaluation, two of which she ultimately generated the word within a socially-acceptable amount of time. During the evaluation,SLP engaged pt in varied conversation from simple to complex.  Motor Speech   Overall Motor Speech  Appears within functional limits for tasks assessed   Pt spoke at times on residual volume                     SLP Education - 12/22/19 1219    Education Details  eval results, no therapy necessary    Person(s) Educated  Patient    Methods  Explanation    Comprehension  Verbalized understanding           Plan - 12/22/19 1219    Clinical Impression Statement  Pt reports today general anxiety when seeing Dr. Brett Fairy [name], she sort of intimidates me." pt stated) which she tells SLP exacerbates a "quivery voice". In fact, pt demonstrated this for first 15-20 minutes of this evaluation today, and, when SLP had pt begin to talk about her writing, her godson, and her previous jobs pt's voice quality was WNL. Word finding difficulty was thought to be WNL. Pt told SLP about some traumatic experiences in her past which incr her anxiety in unfamiliar  situations. SLP ?s if pt should be referred to psychology for this anxiety/stress caused by these past traumatic experiences At this time SLP believes differences in pt's voice pattern and language are due to more of a psychological factor than a neurological factor, (although currently SLP cannot locate brain imaging data in her chart and pt denies any brain imaging in the past 5 years) and therefore no ST is necessary at this time.    Speech Therapy Frequency  One time visit    Consulted and Agree with Plan of Care  Patient       Patient will benefit from skilled therapeutic intervention in order to improve the following deficits and impairments:   Other voice and resonance disorders - Plan: SLP plan of care cert/re-cert  Aphasia - Plan: SLP plan of care cert/re-cert    Problem List Patient Active Problem List   Diagnosis Date Noted  . 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 Aphasia determined by examination 10/30/2019  . PND (post-nasal drip) 10/30/2019  . Sleep related choking sensation 10/30/2019  . Insomnia 08/31/2019  . Allergic rhinitis 04/24/2019  . Hyperglycemia 10/19/2018  . Constipation 10/14/2018  . Rectal bleeding 10/14/2018  . Dyslipidemia 05/24/2018  . Abnormal mammogram of right breast 09/22/2016  . Rosacea 09/18/2015  . S/P cataract surgery 09/18/2015  . OSA on CPAP 02/02/2014  . Tremor, essential 02/02/2014  . UARS (upper airway resistance syndrome) 02/02/2014  . Osteoporosis 07/28/2012  . Glaucoma 07/28/2012  . Restless leg syndrome 07/28/2012  . Anxiety 07/28/2012  . Benign positional vertigo 07/28/2012  . Tinnitus 07/27/2012  . Hearing loss 07/27/2012  . Basal cell cancer 07/27/2012    [facility] ,MS, CCC-SLP  12/22/2019, 12:29 PM  [facility] [address] [place], [place], [address] Phone: [phone]   Fax:  [phone]  Name: Nicole Holt MRN: [id number] Date of Birth:  02/06/1941

## 2020-01-07 ENCOUNTER — Ambulatory Visit: Payer: Medicare HMO | Attending: Internal Medicine

## 2020-01-07 DIAGNOSIS — Z23 Encounter for immunization: Secondary | ICD-10-CM

## 2020-01-07 NOTE — Progress Notes (Signed)
   Covid-19 Vaccination Clinic  Name:  Nicole Holt    MRN: [id number] DOB: August 10, 1941  01/07/2020  Ms. Gorby was observed post Covid-19 immunization for 15 minutes without incident. She was provided with Vaccine Information Sheet and instruction to access the V-Safe system.   Ms. Irvan was instructed to call 911 with any severe reactions post vaccine: Marland Kitchen Difficulty breathing  . Swelling of face and throat  . A fast heartbeat  . A bad rash all over body  . Dizziness and weakness   Immunizations Administered    Name Date Dose VIS Date Route   Pfizer COVID-19 Vaccine 01/07/2020  4:24 PM 0.3 mL 10/13/2019 Intramuscular   Manufacturer: [place]   Lot: [id number]   [place]: [id number]

## 2020-01-11 DIAGNOSIS — G4733 Obstructive sleep apnea (adult) (pediatric): Secondary | ICD-10-CM | POA: Diagnosis not present

## 2020-01-29 ENCOUNTER — Ambulatory Visit: Payer: Medicare HMO | Admitting: Family Medicine

## 2020-02-07 ENCOUNTER — Ambulatory Visit: Payer: Medicare HMO | Attending: Internal Medicine

## 2020-02-07 DIAGNOSIS — Z23 Encounter for immunization: Secondary | ICD-10-CM

## 2020-02-07 NOTE — Progress Notes (Signed)
   Covid-19 Vaccination Clinic  Name:  Nicole Holt    MRN: [id number] DOB: 1941/03/12  02/07/2020  [name] was observed post Covid-19 immunization for 15 minutes without incident. She was provided with Vaccine Information Sheet and instruction to access the V-Safe system.   [name] was instructed to call 911 with any severe reactions post vaccine: Marland Kitchen Difficulty breathing  . Swelling of face and throat  . A fast heartbeat  . A bad rash all over body  . Dizziness and weakness   Immunizations Administered    Name Date Dose VIS Date Route   Pfizer COVID-19 Vaccine 02/07/2020 11:03 AM 0.3 mL 10/13/2019 Intramuscular   Manufacturer: [employer]   Lot: [id number]   [place]: [id number]

## 2020-02-11 DIAGNOSIS — G4733 Obstructive sleep apnea (adult) (pediatric): Secondary | ICD-10-CM | POA: Diagnosis not present

## 2020-02-22 DIAGNOSIS — L821 Other seborrheic keratosis: Secondary | ICD-10-CM | POA: Diagnosis not present

## 2020-02-22 DIAGNOSIS — D485 Neoplasm of uncertain behavior of skin: Secondary | ICD-10-CM | POA: Diagnosis not present

## 2020-02-22 DIAGNOSIS — I788 Other diseases of capillaries: Secondary | ICD-10-CM | POA: Diagnosis not present

## 2020-02-22 DIAGNOSIS — L738 Other specified follicular disorders: Secondary | ICD-10-CM | POA: Diagnosis not present

## 2020-02-22 DIAGNOSIS — Z85828 Personal history of other malignant neoplasm of skin: Secondary | ICD-10-CM | POA: Diagnosis not present

## 2020-02-22 DIAGNOSIS — C44319 Basal cell carcinoma of skin of other parts of face: Secondary | ICD-10-CM | POA: Diagnosis not present

## 2020-03-12 DIAGNOSIS — G4733 Obstructive sleep apnea (adult) (pediatric): Secondary | ICD-10-CM | POA: Diagnosis not present

## 2020-03-13 DIAGNOSIS — C44319 Basal cell carcinoma of skin of other parts of face: Secondary | ICD-10-CM | POA: Diagnosis not present

## 2020-03-13 DIAGNOSIS — Z85828 Personal history of other malignant neoplasm of skin: Secondary | ICD-10-CM | POA: Diagnosis not present

## 2020-03-20 DIAGNOSIS — Z4802 Encounter for removal of sutures: Secondary | ICD-10-CM | POA: Diagnosis not present

## 2020-04-12 DIAGNOSIS — G4733 Obstructive sleep apnea (adult) (pediatric): Secondary | ICD-10-CM | POA: Diagnosis not present

## 2020-05-12 DIAGNOSIS — G4733 Obstructive sleep apnea (adult) (pediatric): Secondary | ICD-10-CM | POA: Diagnosis not present

## 2020-06-03 ENCOUNTER — Other Ambulatory Visit: Payer: Self-pay | Admitting: Family Medicine

## 2020-06-03 DIAGNOSIS — F419 Anxiety disorder, unspecified: Secondary | ICD-10-CM

## 2020-06-03 NOTE — Telephone Encounter (Signed)
LR: 11-08-2019 Qty: 64 w 0 Last office visit: 11-08-2019 Upcoming appointment: No pending appt

## 2020-06-11 ENCOUNTER — Telehealth: Payer: Self-pay | Admitting: Family Medicine

## 2020-06-11 NOTE — Progress Notes (Signed)
  Chronic Care Management   Outreach Note  06/11/2020 Name: Nicole Holt MRN: [id number] DOB: Jan 16, 1941  Referred by: Vivi Barrack, MD Reason for referral : No chief complaint on file.   An unsuccessful telephone outreach was attempted today. The patient was referred to the pharmacist for assistance with care management and care coordination.   Follow Up Plan:   Earney Hamburg Upstream Scheduler

## 2020-06-12 DIAGNOSIS — G4733 Obstructive sleep apnea (adult) (pediatric): Secondary | ICD-10-CM | POA: Diagnosis not present

## 2020-06-13 ENCOUNTER — Telehealth: Payer: Self-pay | Admitting: Family Medicine

## 2020-06-13 NOTE — Progress Notes (Signed)
  Chronic Care Management   Outreach Note  06/13/2020 Name: Nicole Holt MRN: [id number] DOB: 1941/08/16  Referred by: Vivi Barrack, MD Reason for referral : No chief complaint on file.   An unsuccessful telephone outreach was attempted today. The patient was referred to the pharmacist for assistance with care management and care coordination.   Follow Up Plan:   Earney Hamburg Upstream Scheduler

## 2020-06-14 ENCOUNTER — Telehealth: Payer: Self-pay | Admitting: Family Medicine

## 2020-06-14 NOTE — Progress Notes (Signed)
  Chronic Care Management   Note  06/14/2020 Name: Nicole Holt MRN: [id number] DOB: 10/11/1941  Nicole Holt is a 79 y.o. year old female who is a primary care patient of Jerline Pain, Algis Greenhouse, MD. I reached out to Nicole Holt by phone today in response to a referral sent by Ms. Jaclyn Prime Seeley's PCP, Vivi Barrack, MD.   Ms. Paff was given information about Chronic Care Management services today including:  1. CCM service includes personalized support from designated clinical staff supervised by her physician, including individualized plan of care and coordination with other care providers 2. 24/7 contact phone numbers for assistance for urgent and routine care needs. 3. Service will only be billed when office clinical staff spend 20 minutes or more in a month to coordinate care. 4. Only one practitioner may furnish and bill the service in a calendar month. 5. The patient may stop CCM services at any time (effective at the end of the month) by phone call to the office staff.   Patient agreed to services and verbal consent obtained.   Follow up plan:   Earney Hamburg Upstream Scheduler

## 2020-07-05 ENCOUNTER — Other Ambulatory Visit: Payer: Self-pay | Admitting: Family Medicine

## 2020-07-05 DIAGNOSIS — J3489 Other specified disorders of nose and nasal sinuses: Secondary | ICD-10-CM

## 2020-07-13 DIAGNOSIS — G4733 Obstructive sleep apnea (adult) (pediatric): Secondary | ICD-10-CM | POA: Diagnosis not present

## 2020-07-15 ENCOUNTER — Other Ambulatory Visit: Payer: Self-pay

## 2020-07-15 ENCOUNTER — Ambulatory Visit (INDEPENDENT_AMBULATORY_CARE_PROVIDER_SITE_OTHER): Payer: Medicare HMO | Admitting: Family Medicine

## 2020-07-15 ENCOUNTER — Encounter: Payer: Self-pay | Admitting: Family Medicine

## 2020-07-15 VITALS — BP 116/74 | HR 63 | Temp 98.1°F | Ht 62.0 in | Wt 173.8 lb

## 2020-07-15 DIAGNOSIS — R040 Epistaxis: Secondary | ICD-10-CM

## 2020-07-15 DIAGNOSIS — F419 Anxiety disorder, unspecified: Secondary | ICD-10-CM

## 2020-07-15 DIAGNOSIS — R202 Paresthesia of skin: Secondary | ICD-10-CM

## 2020-07-15 NOTE — Assessment & Plan Note (Signed)
Doing better over last month after she stopped drinking alcohol.  We will continue Valium 5 mg 3 times daily.

## 2020-07-15 NOTE — Progress Notes (Signed)
   Nicole Holt is a 79 y.o. female who presents today for an office visit.  Assessment/Plan:  New/Acute Problems: Epistaxis No obvious abnormalities on exam.  Offered referral to ENT however patient declined.  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  Normal exam today does not have any red flag signs or symptoms.  Discussed modifying sleep position.  Persist will consider referral to sports medicine.  Leg Weakness Patient with right hip flexor weakness on exam.  Otherwise normal.  She is concerned about PMR.  Will check labs today per her request including CRP, sed rate, and B12.  Also check CBC, CMET, TSH.  Depending on results will likely need referral to PT/sports med or rheumatology.  Chronic Problems Addressed Today: Anxiety Doing better over last month after she stopped drinking alcohol.  We will continue Valium 5 mg 3 times daily.     Subjective:  HPI:  Patient here today with multiple issues that she would like to discuss  She has been having intermittent nosebleeds out of her right nostril for the past year or so.  Happens every 3 to 4 weeks.  Usually she is able to hold pressure and put tissue in the area which helps to stop.  No pain.  No obvious precipitating events.  She is worried about elevated blood pressure readings because of this.  She is also under more stress recently.  She does admit that she has been heavily drinking for period of several weeks but has not had anything to drink for the past month or so.  She is currently taking Valium as needed which helps well.  She is also had right-sided arm numbness.  This only occurs at night.  She is able to wake up "shake out" her arm which resolves the symptoms.  Usually happens when she is sleeping on her right side.  She has also had more issues with bilateral lower extremity pain and weakness.  Has been going on for several months.  She has a family history of  polymyalgia rheumatica is concerned that she may be developing this.  Pain is worse with going from sitting to standing and with leg raises.      Objective:  Physical Exam: BP 116/74   Pulse 63   Temp 98.1 F (36.7 C) (Temporal)   Ht 5\' 2"  (1.575 m)   Wt 173 lb 12.8 oz (78.8 kg)   SpO2 93%   BMI 31.79 kg/m   Gen: No acute distress, resting comfortably HEENT: Nares normal bilaterally CV: Regular rate and rhythm with no murmurs appreciated Pulm: Normal work of breathing, clear to auscultation bilaterally with no crackles, wheezes, or rhonchi MSK: -Neck: No deformities.  Spurling negative -Right upper extremity: No deformities.  Tinel's sign negative at medial epicondyle and wrist.  Phalen's test negative.  Neurovascular intact distally. -Legs: Right hip flexors 3 out of 5 strength.  5/5 throughout otherwise.  Sensation light touch intact throughout.  Straight leg raise negative Neuro: Grossly normal, moves all extremities Psych: Normal affect and thought content  Time Spent: 47 minutes of total time was spent on the date of the encounter performing the following actions: chart review prior to seeing the patient, obtaining history, performing a medically necessary exam including msk exam, counseling on the treatment plan, placing orders, and documenting in our EHR.        Algis Greenhouse. Jerline Pain, MD 07/15/2020 11:29 AM

## 2020-07-15 NOTE — Patient Instructions (Signed)
It was very nice to see you today!  We will check blood work and a urine sample today.  Please let me know if you need referral to see the ear nose and throat doctor.  I think you are having a pinched nerve in your neck /arm.   I think you have some weakness in your hip flexor muscles.  Will check labs today.  We may need to send you to see a specialist depending on the results.  Take care, Dr Jerline Pain  Please try these tips to maintain a healthy lifestyle:   Eat at least 3 REAL meals and 1-2 snacks per day.  Aim for no more than 5 hours between eating.  If you eat breakfast, please do so within one hour of getting up.    Each meal should contain half fruits/vegetables, one quarter protein, and one quarter carbs (no bigger than a computer mouse)   Cut down on sweet beverages. This includes juice, soda, and sweet tea.     Drink at least 1 glass of water with each meal and aim for at least 8 glasses per day   Exercise at least 150 minutes every week.

## 2020-07-16 LAB — COMPREHENSIVE METABOLIC PANEL
AG Ratio: 1.7 (calc) (ref 1.0–2.5)
ALT: 21 U/L (ref 6–29)
AST: 23 U/L (ref 10–35)
Albumin: 4.6 g/dL (ref 3.6–5.1)
Alkaline phosphatase (APISO): 69 U/L (ref 37–153)
BUN: 14 mg/dL (ref 7–25)
CO2: 30 mmol/L (ref 20–32)
Calcium: 9.6 mg/dL (ref 8.6–10.4)
Chloride: 102 mmol/L (ref 98–110)
Creat: 0.89 mg/dL (ref 0.60–0.93)
Globulin: 2.7 g/dL (calc) (ref 1.9–3.7)
Glucose, Bld: 93 mg/dL (ref 65–99)
Potassium: 4.5 mmol/L (ref 3.5–5.3)
Sodium: 139 mmol/L (ref 135–146)
Total Bilirubin: 0.7 mg/dL (ref 0.2–1.2)
Total Protein: 7.3 g/dL (ref 6.1–8.1)

## 2020-07-16 LAB — SEDIMENTATION RATE: Sed Rate: 11 mm/h (ref 0–30)

## 2020-07-16 LAB — URINALYSIS, ROUTINE W REFLEX MICROSCOPIC
Bilirubin Urine: NEGATIVE
Glucose, UA: NEGATIVE
Hgb urine dipstick: NEGATIVE
Ketones, ur: NEGATIVE
Leukocytes,Ua: NEGATIVE
Nitrite: NEGATIVE
Protein, ur: NEGATIVE
Specific Gravity, Urine: 1.011 (ref 1.001–1.03)
pH: 7 (ref 5.0–8.0)

## 2020-07-16 LAB — CBC
HCT: 45.9 % — ABNORMAL HIGH (ref 35.0–45.0)
Hemoglobin: 15.4 g/dL (ref 11.7–15.5)
MCH: 30.9 pg (ref 27.0–33.0)
MCHC: 33.6 g/dL (ref 32.0–36.0)
MCV: 92.2 fL (ref 80.0–100.0)
MPV: 10 fL (ref 7.5–12.5)
Platelets: 252 10*3/uL (ref 140–400)
RBC: 4.98 10*6/uL (ref 3.80–5.10)
RDW: 12.9 % (ref 11.0–15.0)
WBC: 5.4 10*3/uL (ref 3.8–10.8)

## 2020-07-16 LAB — TSH: TSH: 1.68 mIU/L (ref 0.40–4.50)

## 2020-07-16 LAB — CK: Total CK: 95 U/L (ref 29–143)

## 2020-07-16 LAB — VITAMIN B12: Vitamin B-12: 477 pg/mL (ref 200–1100)

## 2020-07-16 LAB — C-REACTIVE PROTEIN: CRP: 12.2 mg/L — ABNORMAL HIGH (ref <8.0)

## 2020-07-16 NOTE — Progress Notes (Signed)
Please inform patient of the following:  One of her inflammatory markers is slightly elevated but everything else is NORMAL. No signs of polymyalgia rheumatica. Recommend referral to sports medicine or PT if patient is interested.  Algis Greenhouse. Jerline Pain, MD 07/16/2020 9:59 AM

## 2020-07-23 ENCOUNTER — Other Ambulatory Visit: Payer: Medicare HMO

## 2020-07-23 ENCOUNTER — Telehealth: Payer: Self-pay | Admitting: Family Medicine

## 2020-07-23 ENCOUNTER — Other Ambulatory Visit: Payer: Self-pay | Admitting: *Deleted

## 2020-07-23 DIAGNOSIS — Z20822 Contact with and (suspected) exposure to covid-19: Secondary | ICD-10-CM | POA: Diagnosis not present

## 2020-07-23 DIAGNOSIS — G250 Essential tremor: Secondary | ICD-10-CM

## 2020-07-23 NOTE — Telephone Encounter (Signed)
Referral placed.

## 2020-07-23 NOTE — Telephone Encounter (Signed)
Please advise 

## 2020-07-23 NOTE — Telephone Encounter (Signed)
Patient called and said she was ready to have the referral for Sports Medicine.

## 2020-07-23 NOTE — Progress Notes (Signed)
[facility] [employer]

## 2020-07-23 NOTE — Telephone Encounter (Signed)
Ok with me. Please place any necessary orders. 

## 2020-07-25 LAB — NOVEL CORONAVIRUS, NAA: SARS-CoV-2, NAA: NOT DETECTED

## 2020-07-25 LAB — SARS-COV-2, NAA 2 DAY TAT

## 2020-07-26 ENCOUNTER — Telehealth: Payer: Self-pay | Admitting: Family Medicine

## 2020-07-26 NOTE — Telephone Encounter (Signed)
Negative COVID results given. Patient results "NOT Detected." Caller expressed understanding. ° °

## 2020-07-31 ENCOUNTER — Ambulatory Visit (INDEPENDENT_AMBULATORY_CARE_PROVIDER_SITE_OTHER): Payer: Medicare HMO

## 2020-07-31 ENCOUNTER — Encounter: Payer: Self-pay | Admitting: Family Medicine

## 2020-07-31 ENCOUNTER — Ambulatory Visit: Payer: Self-pay

## 2020-07-31 ENCOUNTER — Ambulatory Visit (INDEPENDENT_AMBULATORY_CARE_PROVIDER_SITE_OTHER): Payer: Medicare HMO | Admitting: Family Medicine

## 2020-07-31 ENCOUNTER — Other Ambulatory Visit: Payer: Self-pay

## 2020-07-31 VITALS — BP 120/78 | HR 67 | Ht 62.0 in | Wt 171.8 lb

## 2020-07-31 DIAGNOSIS — M48061 Spinal stenosis, lumbar region without neurogenic claudication: Secondary | ICD-10-CM | POA: Diagnosis not present

## 2020-07-31 DIAGNOSIS — M25551 Pain in right hip: Secondary | ICD-10-CM | POA: Diagnosis not present

## 2020-07-31 DIAGNOSIS — M25561 Pain in right knee: Secondary | ICD-10-CM | POA: Diagnosis not present

## 2020-07-31 DIAGNOSIS — G8929 Other chronic pain: Secondary | ICD-10-CM

## 2020-07-31 DIAGNOSIS — M25562 Pain in left knee: Secondary | ICD-10-CM

## 2020-07-31 DIAGNOSIS — M1611 Unilateral primary osteoarthritis, right hip: Secondary | ICD-10-CM | POA: Diagnosis not present

## 2020-07-31 DIAGNOSIS — M5136 Other intervertebral disc degeneration, lumbar region: Secondary | ICD-10-CM | POA: Diagnosis not present

## 2020-07-31 NOTE — Progress Notes (Signed)
Subjective:    I'm seeing this patient as a consultation for:  Dr. Jerline Pain. Note will be routed back to referring provider/PCP.  CC: B LE pain and weakness  I, Molly Weber, LAT, ATC, am serving as scribe for Dr. Lynne Leader.  HPI: Pt is a 79 y/o female presenting w/ c/o right worse than left bilateral LE pain/weakness.    LE pain/weakness: Has been happening for several months.  She notices the weakness in her knees and thighs.  She also has pain in her R groin and medial thigh.  She also has a family hx of polymyalgia rheumatica. -Numbness/tingling: yes at night when she's trying to sleep -Aggravating factors: climbing stairs; prolonged walking -Treatments tried: turmeric; IBU weight loss, exercises  She also notes some muscle twitching and spasming at night that she attributes to restless leg syndrome.  She would like to try treating these problems without medications if possible.  Past medical history, Surgical history, Family history, Social history, Allergies, and medications have been entered into the medical record, reviewed.   Review of Systems: No new headache, visual changes, nausea, vomiting, diarrhea, constipation, dizziness, abdominal pain, skin rash, fevers, chills, night sweats, weight loss, swollen lymph nodes, body aches, joint swelling, muscle aches, chest pain, shortness of breath, mood changes, visual or auditory hallucinations.   Objective:    Vitals:   07/31/20 1328  BP: 120/78  Pulse: 67  SpO2: 98%   General: Well Developed, well nourished, and in no acute distress.  Neuro/Psych: Alert and oriented x3, extra-ocular muscles intact, able to move all 4 extremities, sensation grossly intact. Skin: Warm and dry, no rashes noted.  Respiratory: Not using accessory muscles, speaking in full sentences, trachea midline.  Cardiovascular: Pulses palpable, no extremity edema. Abdomen: Does not appear distended. MSK: L-spine normal-appearing nontender midline  decreased lumbar motion to flexion and extension. Right hip normal-appearing mildly tender palpation greater trochanter.  Decreased hip motion to flexion and internal rotation. Strength diminished to abduction and external rotation. Right knee normal-appearing nontender normal motion with crepitation.  Stable ligamentous exam.  Lab and Radiology Results  X-ray images L-spine right hip and right knee obtained today personally and independently interpreted  L-spine: DDD L-spine.  No fractures.  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  Right knee: Minimal DJD.  No fracture.  Await formal radiology review  Impression and Recommendations:    Assessment and Plan: 79 y.o. female with right leg pain and weakness.  Keionna is not a good historian and it was pretty challenging to figure out what her chief complaint was today.  Fundamentally she has discomfort and weakness especially around the right hip with pain into the lower leg.  Additionally she is having some tremors and twitchy sensations at bedtime.  Plan today for x-ray evaluation lumbar spine hip and knee to evaluate for arthritis.  Additionally will provide home exercise teaching for hip strengthening.  She should really should be doing physical therapy but she would like to try it on her own first which is reasonable.  If not improving will proceed with formal physical therapy and return to clinic.    Otherwise recheck in 1 month.Marland Kitchen  PDMP not reviewed this encounter. Orders Placed This Encounter  Procedures  . DG Lumbar Spine 2-3 Views    Standing Status:   Future    Number of Occurrences:   1    Standing Expiration Date:   07/31/2021    Order Specific Question:   Reason for Exam (  SYMPTOM  OR DIAGNOSIS REQUIRED)    Answer:   eval right leg pain    Order Specific Question:   Preferred imaging location?    Answer:   [facility]  . DG HIP UNILAT WITH PELVIS 2-3 VIEWS RIGHT    Standing Status:   Future     Number of Occurrences:   1    Standing Expiration Date:   07/31/2021    Order Specific Question:   Reason for Exam (SYMPTOM  OR DIAGNOSIS REQUIRED)    Answer:   eval hip pain    Order Specific Question:   Preferred imaging location?    Answer:   [facility]  . DG Knee AP/LAT W/Sunrise Right    Standing Status:   Future    Number of Occurrences:   1    Standing Expiration Date:   07/31/2021    Order Specific Question:   Reason for Exam (SYMPTOM  OR DIAGNOSIS REQUIRED)    Answer:   eval knee pain    Order Specific Question:   Preferred imaging location?    Answer:   [facility]   No orders of the defined types were placed in this encounter.   Discussed warning signs or symptoms. Please see discharge instructions. Patient expresses understanding.   The above documentation has been reviewed and is accurate and complete Lynne Leader, M.D.

## 2020-07-31 NOTE — Patient Instructions (Addendum)
Thank you for coming in today.  Please get an Xray today before you leave  I would like you get you going with physical therapy.  We can try it on your own first.  If not getting better let me know and I will order physical therapy.   Please perform the exercise program that we have prepared for you and gone over in detail on a daily basis.  In addition to the handout you were provided you can access your program through: [email or web address]   Your unique program code is:  [email or web address]   Recheck in about 1 month with me.   I can add medicine at bedtime as needed for restless leg.

## 2020-08-02 ENCOUNTER — Other Ambulatory Visit: Payer: Self-pay | Admitting: Family Medicine

## 2020-08-02 DIAGNOSIS — Z1231 Encounter for screening mammogram for malignant neoplasm of breast: Secondary | ICD-10-CM

## 2020-08-02 NOTE — Progress Notes (Signed)
X-ray lumbar spine shows mild arthritis and some mild scoliosis .Marland Kitchen  No fractures.

## 2020-08-02 NOTE — Progress Notes (Signed)
X-ray right hip shows mild hip arthritis without fracture.

## 2020-08-02 NOTE — Progress Notes (Signed)
X-ray of right knee looks normal to radiology.  Not much arthritis present.  No fractures present.

## 2020-08-12 DIAGNOSIS — G4733 Obstructive sleep apnea (adult) (pediatric): Secondary | ICD-10-CM | POA: Diagnosis not present

## 2020-08-14 ENCOUNTER — Other Ambulatory Visit: Payer: Self-pay | Admitting: *Deleted

## 2020-08-14 DIAGNOSIS — E785 Hyperlipidemia, unspecified: Secondary | ICD-10-CM

## 2020-08-14 DIAGNOSIS — R739 Hyperglycemia, unspecified: Secondary | ICD-10-CM

## 2020-08-21 DIAGNOSIS — H2511 Age-related nuclear cataract, right eye: Secondary | ICD-10-CM | POA: Diagnosis not present

## 2020-08-21 DIAGNOSIS — H401132 Primary open-angle glaucoma, bilateral, moderate stage: Secondary | ICD-10-CM | POA: Diagnosis not present

## 2020-08-21 DIAGNOSIS — H04221 Epiphora due to insufficient drainage, right lacrimal gland: Secondary | ICD-10-CM | POA: Diagnosis not present

## 2020-08-21 DIAGNOSIS — H43813 Vitreous degeneration, bilateral: Secondary | ICD-10-CM | POA: Diagnosis not present

## 2020-08-22 ENCOUNTER — Ambulatory Visit
Admission: RE | Admit: 2020-08-22 | Discharge: 2020-08-22 | Disposition: A | Discharge status: Home / Self Care | Payer: Medicare HMO | Source: Ambulatory Visit | Attending: Family Medicine | Admitting: Family Medicine

## 2020-08-22 ENCOUNTER — Other Ambulatory Visit: Payer: Self-pay

## 2020-08-22 DIAGNOSIS — Z1231 Encounter for screening mammogram for malignant neoplasm of breast: Secondary | ICD-10-CM | POA: Diagnosis not present

## 2020-08-26 DIAGNOSIS — H59211 Accidental puncture and laceration of right eye and adnexa during an ophthalmic procedure: Secondary | ICD-10-CM | POA: Diagnosis not present

## 2020-08-26 DIAGNOSIS — H2511 Age-related nuclear cataract, right eye: Secondary | ICD-10-CM | POA: Diagnosis not present

## 2020-08-26 DIAGNOSIS — H401132 Primary open-angle glaucoma, bilateral, moderate stage: Secondary | ICD-10-CM | POA: Diagnosis not present

## 2020-08-27 ENCOUNTER — Telehealth: Payer: Self-pay

## 2020-08-27 DIAGNOSIS — H401132 Primary open-angle glaucoma, bilateral, moderate stage: Secondary | ICD-10-CM | POA: Diagnosis not present

## 2020-08-27 DIAGNOSIS — H2511 Age-related nuclear cataract, right eye: Secondary | ICD-10-CM | POA: Diagnosis not present

## 2020-08-27 NOTE — Progress Notes (Signed)
Chronic Care Management Pharmacy Assistant   Name: Nicole Holt  MRN: [id number] DOB: 1941-08-18  Reason for Encounter: Initial Call  Patient Questions:  1.  Have you seen any other providers since your last visit?        Patient stated she had a visit with sports medicine specialist Lynne Leader MD for arthritis in knee, shoulder, and hip. Patient also stated she had a visit with Opthalmologist on 08-26-2020 for cataract surgery.    2.  Any changes in your medicines or health?   Patient stated no changes in medications at this time.   Nicole Holt,  79 y.o. , female presents for their Initial CCM visit with the clinical pharmacist In office.  PCP : Vivi Barrack, MD  Allergies:   Allergies  Allergen Reactions  . Codeine Nausea Only  . Estrogens Conjugated Other (See Comments)    HA  . Paroxetine Hcl Other (See Comments)    Felt like she was "out of her head"    Medications: Outpatient Encounter Medications as of 08/27/2020  Medication Sig  . diazepam (VALIUM) 5 MG tablet TAKE 1 TABLET BY MOUTH EVERY 6 HOURS AS NEEDED FOR ANXIETY  . diclofenac (VOLTAREN) 75 MG EC tablet Take 1 tablet (75 mg total) by mouth 2 (two) times daily.  . fluticasone (FLONASE) 50 MCG/ACT nasal spray Use 2 spray(s) in each nostril once daily  . Melatonin 10 MG TABS Take by mouth.  . Multiple Vitamins-Minerals (CENTRUM ADULTS PO) Take by mouth.  . Omega-3 Fatty Acids (FISH OIL PO) Take by mouth.  . Travoprost, BAK Free, (TRAVATAN) 0.004 % SOLN ophthalmic solution Place 1 drop into both eyes at bedtime.  . Turmeric (QC TUMERIC COMPLEX PO) Take by mouth.   No facility-administered encounter medications on file as of 08/27/2020.    Current Diagnosis: Patient Active Problem List   Diagnosis Date Noted  . 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 PND (post-nasal drip) 10/30/2019  . Sleep related choking sensation 10/30/2019  . Insomnia 08/31/2019  . Allergic rhinitis 04/24/2019  .  Hyperglycemia 10/19/2018  . Constipation 10/14/2018  . Rectal bleeding 10/14/2018  . Dyslipidemia 05/24/2018  . Abnormal mammogram of right breast 09/22/2016  . Rosacea 09/18/2015  . S/P cataract surgery 09/18/2015  . OSA on CPAP 02/02/2014  . Tremor, essential 02/02/2014  . UARS (upper airway resistance syndrome) 02/02/2014  . Osteoporosis 07/28/2012  . Glaucoma 07/28/2012  . Restless leg syndrome 07/28/2012  . Anxiety 07/28/2012  . Benign positional vertigo 07/28/2012  . Tinnitus 07/27/2012  . Hearing loss 07/27/2012  . Basal cell cancer 07/27/2012   Have you seen any other providers since your last visit?        Patient stated she had a visit with sports medicine specialist Lynne Leader MD for arthritis in knee, shoulder, and hip. Patient also stated she had a visit with Opthalmologist on 08-26-2020 for cataract surgery.  Any changes in your medications or health?      Patient stated no changes in medications at this time.  Any side effects from any medications?     Patient stated she has no side effects from medications at this time.  Do you have an symptoms or problems not managed by your medications?     Patient stated she has no symptoms or concerns at this time.  Any concerns about your health right now?      Patient stated she is experiencing intense shoulder pain from arthritis. Patient  stated she is taking a lot of Ibuprofen and Diazepam for shoulder pain. Patient stated she would like to have a medication that's effective for pain.  Has your provider asked that you check blood pressure, blood sugar, or follow special diet at home?      Patient stated she does not check blood pressure, blood sugar and she is not on a special diet.  Do you get any type of exercise on a regular basis?       Patient stated she does not exercise at this time due to knee pain from arthritis.  Can you think of a goal you would like to reach for your health?      Patient stated she would like  to get shoulder pain under control.  Do you have any problems getting your medications?      Patient stated she does not have any problems getting medications at this time.  Is there anything that you would like to discuss during the appointment?     Patient stated she would like to discuss insomnia. Patient stated she has an issue with staying asleep. Patient stated she is taking Melatonin to help her stay asleep and it has not been effective. Patient would like to have a more effective sleep aid.  Please bring medications and supplements to appointment   Nicole Holt ,[facility] Clinical Pharmacist Assistant [phone]  Follow-Up:  Pharmacist Review

## 2020-08-28 ENCOUNTER — Other Ambulatory Visit: Payer: Self-pay

## 2020-08-28 ENCOUNTER — Ambulatory Visit: Payer: Medicare HMO

## 2020-08-28 DIAGNOSIS — E785 Hyperlipidemia, unspecified: Secondary | ICD-10-CM

## 2020-08-28 DIAGNOSIS — F419 Anxiety disorder, unspecified: Secondary | ICD-10-CM

## 2020-08-28 NOTE — Progress Notes (Signed)
Chronic Care Management Pharmacy  Name: Nicole Holt   MRN: [id number]   DOB: 11/17/1940  Chief Complaint/ HPI Nicole Holt, 79 y.o., female, presents for their initial CCM visit with the clinical pharmacist in office.   PCP : Vivi Barrack, MD Encounter Diagnoses  Name Primary?   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   PND (post-nasal drip) 10/30/2019   Sleep related choking sensation 10/30/2019   Insomnia 08/31/2019   Allergic rhinitis 04/24/2019   Hyperglycemia 10/19/2018   Constipation 10/14/2018   Rectal bleeding 10/14/2018   Dyslipidemia 05/24/2018   Abnormal mammogram of right breast 09/22/2016   Rosacea 09/18/2015   S/P cataract surgery 09/18/2015   OSA on CPAP 02/02/2014   Tremor, essential 02/02/2014   UARS (upper airway resistance syndrome) 02/02/2014   Osteoporosis 07/28/2012   Glaucoma 07/28/2012   Restless leg syndrome 07/28/2012   Anxiety 07/28/2012   Benign positional vertigo 07/28/2012   Tinnitus 07/27/2012   Hearing loss 07/27/2012   Basal cell cancer 07/27/2012   Past Surgical History:  Procedure Laterality Date   cataract surgery     COSMETIC SURGERY     Family History  Problem Relation Age of Onset   Osteoporosis Mother    Stroke Mother    Hyperlipidemia Mother    Hypertension Mother    Kidney disease Father    Congestive Heart Failure Father    Heart disease Father    Hyperlipidemia Father    Stroke Sister    Parkinson's disease Sister    Heart disease Sister    Heart disease Maternal Grandmother    Gout Maternal Grandmother    Stroke Maternal Grandfather    Stroke Paternal Grandmother    Thrombosis Paternal Grandfather    Early death Paternal Grandfather    Hypertension Brother    Diabetes Brother    Arthritis/Rheumatoid Other    Parkinson's disease Other    Heart disease Other     Obesity Daughter    Breast cancer Neg Hx    Allergies  Allergen Reactions   Codeine Nausea Only   Estrogens Conjugated Other (See Comments)    HA   Paroxetine Hcl Other (See Comments)    Felt like she was "out of her head"   Outpatient Encounter Medications as of 08/28/2020  Medication Sig   diazepam (VALIUM) 5 MG tablet TAKE 1 TABLET BY MOUTH EVERY 6 HOURS AS NEEDED FOR ANXIETY   fluticasone (FLONASE) 50 MCG/ACT nasal spray Use 2 spray(s) in each nostril once daily   Melatonin 10 MG TABS Take by mouth.   Multiple Vitamins-Minerals (CENTRUM ADULTS PO) Take by mouth.   Omega-3 Fatty Acids (FISH OIL PO) Take by mouth.   Travoprost, BAK Free, (TRAVATAN) 0.004 % SOLN ophthalmic solution Place 1 drop into both eyes at bedtime.   Turmeric (QC TUMERIC COMPLEX PO) Take by mouth.   diclofenac (VOLTAREN) 75 MG EC tablet Take 1 tablet (75 mg total) by mouth 2 (two) times daily. (Patient not taking: Reported on 08/28/2020)   No facility-administered encounter medications on file as of 08/28/2020.   Patient Care Team    Relationship Specialty Notifications Start End  Vivi Barrack, MD PCP - General Family Medicine  05/24/18   Dennie Bible, NP Nurse Practitioner Neurology  09/22/16   Marylynn Pearson, MD Consulting Physician Ophthalmology  09/22/16   Dohmeier, Asencion Partridge, MD Consulting Physician  Neurology  05/24/18   Dentistry, [facility]    05/24/18   Madelin Rear, [facility] Pharmacist Pharmacist  06/14/20    Comment: [phone]   Current Diagnosis/Assessment: Goals Addressed            This Visit's Progress    PharmD Care Plan       CARE PLAN ENTRY (see longitudinal plan of care for additional care plan information)  Current Barriers:   Chronic Disease Management support, education, and care coordination needs related to Hyperlipidemia and Anxiety   Hyperlipidemia Lab Results  Component Value Date/Time   LDLCALC 102 (H) 11/08/2019 11:47 AM   LDLCALC 102  (H) 09/27/2017 10:57 AM   LDLDIRECT 116.0 10/14/2018 12:06 PM    Pharmacist Clinical Goal(s): o Over the next 180 days, patient will work with PharmD and providers to achieve LDL goal < 100  Current regimen:  o Diet and exercise (as tolerated) alone  Interventions: o Reviewed dietary recommendations including focus on fruits/vegetables, white meats, salt avoidance, limiting sweets.  o Recommend substituting nuts over carb rich snacks such as crackers  Patient self care activities - Over the next 180 days, patient will: o Continue current management Anxiety  Pharmacist Clinical Goal(s) o Over the next 180 days, patient will work with PharmD and providers to minimize symptoms of anxiety and ensure medication safety  Current regimen:  o Diazepam every 6 hours as needed  Interventions: o Reviewed potential options for antianxiety pharmacotherapy, counseling  Patient self care activities - Over the next 180 days, patient will: o Continue current management o Let office know if she is wanting additional support for anxiety  Medication management  Pharmacist Clinical Goal(s): o Over the next 180 days, patient will work with PharmD and providers to maintain optimal medication adherence  Current pharmacy: [facility]  Interventions o Comprehensive medication review performed. o Continue current medication management strategy  Patient self care activities - Over the next 180 days, patient will: o Take medications as prescribed o Report any questions or concerns to PharmD and/or provider(s) Initial goal documentation.      Hyperlipidemia   LDL goal < 100  Lipid Panel     Component Value Date/Time   CHOL 187 11/08/2019 1147   CHOL 197 09/27/2017 1057   TRIG 137.0 11/08/2019 1147   HDL 57.00 11/08/2019 1147   HDL 63 09/27/2017 1057   LDLCALC 102 (H) 11/08/2019 1147   LDLCALC 102 (H) 09/27/2017 1057   LDLDIRECT 116.0 10/14/2018 1206    Hepatic Function Latest Ref Rng &  Units 07/15/2020 11/08/2019 04/24/2019  Total Protein 6.1 - 8.1 g/dL 7.3 6.7 6.5  Albumin 3.5 - 5.2 g/dL - 4.2 4.3  AST 10 - 35 U/L _0 ALT 6 - 29 U/L _1 Alk Phosphatase 39 - 117 U/L - 62 64  Total Bilirubin 0.2 - 1.2 mg/dL 0.7 0.5 0.5    The 10-year ASCVD risk score Mikey Bussing DC Jr., et al., 2013) is: 19.6%   Values used to calculate the score:     Age: 30 years     Sex: Female     Is Non-Hispanic African American: No     Diabetic: No     Tobacco smoker: No     Systolic Blood Pressure: 333 mmHg     Is BP treated: No     HDL Cholesterol: 57 mg/dL     Total Cholesterol: 187 mg/dL   Patient has failed these meds in past: previous rx  for Lipitor but not taken due to wanting to control with diet and exercise. Exercise has been limited due to some recent injury in back/shoulder, is seeing sports medicine.  Patient is currently near goal the following medications:   n/a  We discussed diet and exercise extensively including focus on fruits/vegetables, white meats, salt avoidance, limiting sweets.   Plan  Continue current medications.  Osteopenia   Last DEXA Scan: 2018 - osteopenia.  No results found for: VD25OH  Previous medications: n/a.Marland Kitchen Patient is not a candidate for pharmacologic treatment.  Current medications:  Calcium/vitamin d3 supplementation  We discussed: Recommend [phone] units of vitamin D daily. Recommend 1200 mg of calcium daily from dietary and supplemental sources. Recommend weight-bearing and muscle strengthening exercises for building and maintaining bone density.  Plan  Continue current medications.  Anxiety/Depression   Previous medications: took one dose of paxil several years ago, ortherwise no hx of SSRIs or similar medications.  Taking diazepam as needed. Denies side effects, reports ongoing benefit of taking medication.  Not wanting to start on maintenance therapy or counseling at this time.  Patient is currently controlled on the  following medications:   Valium 5 mg once daily as needed  Plan  Continue current medications.  Vaccines   Immunization History  Administered Date(s) Administered   PFIZER SARS-COV-2 Vaccination 01/07/2020, 02/07/2020   Pneumococcal Conjugate-13 09/22/2016   Pneumococcal Polysaccharide-23 07/27/2012   Td 11/03/2007   Zoster 10/02/2015   Planning on getting covid vaccine booster within next week. Declined annual flu shot, has not received previously. Reviewed and discussed patient's vaccination history.    Provided information on scheduling pfizer covid booster.  Plan  Recommended patient receive covid vaccine booster, tdap, shingrix.   Medication Management / Care Coordination   Receives prescription medications from:  [place], [place] [address] [place] [place] [address] Phone: [phone] Fax: [phone]   Denies any issues with current medication management.   Plan  Continue current medication management strategy. ___________________________ SDOH (Social Determinants of Health) assessments performed: Yes.  Future Appointments  Date Time Provider [place]  08/29/2020  1:30 PM Gregor Hams, MD [facility] None  11/07/2020 11:00 AM [facility] HEALTH COACH [facility]  11/12/2020 11:00 AM Vivi Barrack, MD [facility]  02/24/2021  1:00 PM [facility] CCM PHARMACIST [facility]   Visit follow-up:   [facility] follow-up: 6 month office visit.  Madelin Rear, Pharm.D., BCGP Clinical Pharmacist [facility] [phone]

## 2020-08-28 NOTE — Patient Instructions (Signed)
Please review care plan below and call me at [phone] with any questions!  Thank you, Nicole Holt., Clinical Pharmacist  Goals Addressed            This Visit's Progress   . PharmD Care Plan       CARE PLAN ENTRY (see longitudinal plan of care for additional care plan information)  Current Barriers:  . Chronic Disease Management support, education, and care coordination needs related to Hyperlipidemia and Anxiety   Hyperlipidemia Lab Results  Component Value Date/Time   LDLCALC 102 (H) 11/08/2019 11:47 AM   LDLCALC 102 (H) 09/27/2017 10:57 AM   LDLDIRECT 116.0 10/14/2018 12:06 PM   . Pharmacist Clinical Goal(s): o Over the next 180 days, patient will work with PharmD and providers to achieve LDL goal < 100 . Current regimen:  o Diet and exercise (as tolerated) alone . Interventions: o Reviewed dietary recommendations including focus on fruits/vegetables, white meats, salt avoidance, limiting sweets.  o Recommend substituting nuts over carb rich snacks such as crackers . Patient self care activities - Over the next 180 days, patient will: o Continue current management Anxiety . Pharmacist Clinical Goal(s) o Over the next 180 days, patient will work with PharmD and providers to minimize symptoms of anxiety and ensure medication safety . Current regimen:  o Diazepam every 6 hours as needed . Interventions: o Reviewed potential options for antianxiety pharmacotherapy, counseling . Patient self care activities - Over the next 180 days, patient will: o Continue current management o Let office know if she is wanting additional support for anxiety  Medication management . Pharmacist Clinical Goal(s): o Over the next 180 days, patient will work with PharmD and providers to maintain optimal medication adherence . Current pharmacy: [facility] . Interventions o Comprehensive medication review performed. o Continue current medication management strategy . Patient self care  activities - Over the next 180 days, patient will: o Take medications as prescribed o Report any questions or concerns to PharmD and/or provider(s) Initial goal documentation.      The patient verbalized understanding of instructions provided today and agreed to receive a mailed copy of patient instruction and/or educational materials. Telephone follow up appointment with pharmacy team member scheduled for: See next appointment with "Care Management Staff" under "What's Next" below.   Madelin Rear, Pharm.D., BCGP Clinical Pharmacist [facility] [phone]  High Cholesterol  High cholesterol is a condition in which the blood has high levels of a white, waxy, fat-like substance (cholesterol). The human body needs small amounts of cholesterol. The liver makes all the cholesterol that the body needs. Extra (excess) cholesterol comes from the food that we eat. Cholesterol is carried from the liver by the blood through the blood vessels. If you have high cholesterol, deposits (plaques) may build up on the walls of your blood vessels (arteries). Plaques make the arteries narrower and stiffer. Cholesterol plaques increase your risk for heart attack and stroke. Work with your health care provider to keep your cholesterol levels in a healthy range. What increases the risk? This condition is more likely to develop in people who:  Eat foods that are high in animal fat (saturated fat) or cholesterol.  Are overweight.  Are not getting enough exercise.  Have a family history of high cholesterol. What are the signs or symptoms? There are no symptoms of this condition. How is this diagnosed? This condition may be diagnosed from the results of a blood test.  If you are older than age 95,  your health care provider may check your cholesterol every 4-6 years.  You may be checked more often if you already have high cholesterol or other risk factors for heart disease. The blood test for  cholesterol measures:  "Bad" cholesterol (LDL cholesterol). This is the main type of cholesterol that causes heart disease. The desired level for LDL is less than 100.  "Good" cholesterol (HDL cholesterol). This type helps to protect against heart disease by cleaning the arteries and carrying the LDL away. The desired level for HDL is 60 or higher.  Triglycerides. These are fats that the body can store or burn for energy. The desired number for triglycerides is lower than 150.  Total cholesterol. This is a measure of the total amount of cholesterol in your blood, including LDL cholesterol, HDL cholesterol, and triglycerides. A healthy number is less than 200. How is this treated? This condition is treated with diet changes, lifestyle changes, and medicines. Diet changes  This may include eating more whole grains, fruits, vegetables, nuts, and fish.  This may also include cutting back on red meat and foods that have a lot of added sugar. Lifestyle changes  Changes may include getting at least 40 minutes of aerobic exercise 3 times a week. Aerobic exercises include walking, biking, and swimming. Aerobic exercise along with a healthy diet can help you maintain a healthy weight.  Changes may also include quitting smoking. Medicines  Medicines are usually given if diet and lifestyle changes have failed to reduce your cholesterol to healthy levels.  Your health care provider may prescribe a statin medicine. Statin medicines have been shown to reduce cholesterol, which can reduce the risk of heart disease. Follow these instructions at home: Eating and drinking If told by your health care provider:  Eat chicken (without skin), fish, veal, shellfish, ground [place] breast, and round or loin cuts of red meat.  Do not eat fried foods or fatty meats, such as hot dogs and salami.  Eat plenty of fruits, such as apples.  Eat plenty of vegetables, such as broccoli, potatoes, and carrots.  Eat  beans, peas, and lentils.  Eat grains such as barley, rice, couscous, and bulgur wheat.  Eat pasta without cream sauces.  Use skim or nonfat milk, and eat low-fat or nonfat yogurt and cheeses.  Do not eat or drink whole milk, cream, ice cream, egg yolks, or hard cheeses.  Do not eat stick margarine or tub margarines that contain trans fats (also called partially hydrogenated oils).  Do not eat saturated tropical oils, such as coconut oil and palm oil.  Do not eat cakes, cookies, crackers, or other baked goods that contain trans fats.  General instructions  Exercise as directed by your health care provider. Increase your activity level with activities such as gardening, walking, and taking the stairs.  Take over-the-counter and prescription medicines only as told by your health care provider.  Do not use any products that contain nicotine or tobacco, such as cigarettes and e-cigarettes. If you need help quitting, ask your health care provider.  Keep all follow-up visits as told by your health care provider. This is important. Contact a health care provider if:  You are struggling to maintain a healthy diet or weight.  You need help to start on an exercise program.  You need help to stop smoking. Get help right away if:  You have chest pain.  You have trouble breathing. This information is not intended to replace advice given to you by your  health care provider. Make sure you discuss any questions you have with your health care provider. Document Revised: 10/22/2017 Document Reviewed: 04/18/2016 Elsevier Patient Education  [place].

## 2020-08-29 ENCOUNTER — Encounter: Payer: Self-pay | Admitting: Family Medicine

## 2020-08-29 ENCOUNTER — Ambulatory Visit (INDEPENDENT_AMBULATORY_CARE_PROVIDER_SITE_OTHER): Payer: Medicare HMO | Admitting: Family Medicine

## 2020-08-29 ENCOUNTER — Other Ambulatory Visit: Payer: Self-pay | Admitting: Family Medicine

## 2020-08-29 ENCOUNTER — Ambulatory Visit: Payer: Self-pay

## 2020-08-29 VITALS — BP 134/78 | HR 66 | Ht 62.0 in | Wt 172.0 lb

## 2020-08-29 DIAGNOSIS — F419 Anxiety disorder, unspecified: Secondary | ICD-10-CM

## 2020-08-29 DIAGNOSIS — M9903 Segmental and somatic dysfunction of lumbar region: Secondary | ICD-10-CM | POA: Diagnosis not present

## 2020-08-29 DIAGNOSIS — M5412 Radiculopathy, cervical region: Secondary | ICD-10-CM

## 2020-08-29 DIAGNOSIS — M5032 Other cervical disc degeneration, mid-cervical region, unspecified level: Secondary | ICD-10-CM | POA: Diagnosis not present

## 2020-08-29 DIAGNOSIS — M9901 Segmental and somatic dysfunction of cervical region: Secondary | ICD-10-CM | POA: Diagnosis not present

## 2020-08-29 DIAGNOSIS — M25511 Pain in right shoulder: Secondary | ICD-10-CM

## 2020-08-29 DIAGNOSIS — M62830 Muscle spasm of back: Secondary | ICD-10-CM | POA: Diagnosis not present

## 2020-08-29 MED ORDER — DIAZEPAM 5 MG PO TABS: 5.0000 mg | ORAL_TABLET | Freq: Four times a day (QID) | ORAL | 0 refills | Status: DC | PRN

## 2020-08-29 MED ORDER — PREDNISONE 50 MG PO TABS: 50.0000 mg | ORAL_TABLET | Freq: Every day | ORAL | 0 refills | Status: DC

## 2020-08-29 MED ORDER — GABAPENTIN 100 MG PO CAPS: 100.0000 mg | ORAL_CAPSULE | Freq: Three times a day (TID) | ORAL | 1 refills | Status: DC | PRN

## 2020-08-29 NOTE — Progress Notes (Signed)
° °  I, Wendy Poet, LAT, ATC, am serving as scribe for Dr. Lynne Leader.  Nicole Holt is a 79 y.o. female who presents to [place] at [facility] today for f/u of B knee and R hip pain and B LE weakness.  She was last seen by Dr. Georgina Snell on 07/31/20 and was shown a HEP to include hip flexor and hip aBd SLRs and bridges.  Since her last visit, pt reports that she was doing her HEP but started having mid-back pain and R shoulder pain.  She is also having radiating pain into her R UE.  She cannot tell any difference in terms of leg pain w/ squatting.  Diagnostic testing: L-spine, R hip and R knee XR- 07/31/20   Pertinent review of systems: No fevers or chills  Relevant historical information: Right eye cataract surgery recently.  Sleep apnea   Exam:  BP 134/78 (BP Location: Right Arm, Patient Position: Sitting, Cuff Size: Normal)    Pulse 66    Ht 5\' 2"  (1.575 m)    Wt 172 lb (78 kg)    SpO2 98%    BMI 31.46 kg/m  General: Well Developed, well nourished, and in no acute distress.   MSK: C-spine normal-appearing nontender midline.  Decreased cervical motion.  Reproducing arm symptoms with neck extension. Upper extremity strength intact. Reflexes intact equal bilateral upper extremities. Right shoulder normal-appearing nontender normal shoulder motion normal strength negative impingement testing.  Right hip normal-appearing decreased motion not particular tender palpation.    Lab and Radiology Results  X-ray C-spine was ordered today but patient forgot to get the x-rays on her way out.    Assessment and Plan: 79 y.o. female with cervical radiculopathy and arm pain.  New issue today.  Unclear etiology with no trauma cause.  Plan for course of prednisone and gabapentin.  Again recommended physical therapy and again patient declined but she will be willing to consider it if not better in the near future.  Right hip and leg pain from last month: Somewhat improved with a  bit of watchful waiting and home exercise program.  Again would consider PT in the future if needed.  Recheck in 1 month.   PDMP not reviewed this encounter. Orders Placed This Encounter  Procedures   DG Cervical Spine 2 or 3 views    Standing Status:   Future    Standing Expiration Date:   08/29/2021    Order Specific Question:   Reason for Exam (SYMPTOM  OR DIAGNOSIS REQUIRED)    Answer:   eval right cervical rad    Order Specific Question:   Preferred imaging location?    Answer:   [facility]   Meds ordered this encounter  Medications   predniSONE (DELTASONE) 50 MG tablet    Sig: Take 1 tablet (50 mg total) by mouth daily.    Dispense:  5 tablet    Refill:  0   gabapentin (NEURONTIN) 100 MG capsule    Sig: Take 1-3 capsules (100-300 mg total) by mouth 3 (three) times daily as needed (nerve pain).    Dispense:  90 capsule    Refill:  1     Discussed warning signs or symptoms. Please see discharge instructions. Patient expresses understanding.   The above documentation has been reviewed and is accurate and complete Lynne Leader, M.D.

## 2020-08-29 NOTE — Patient Instructions (Addendum)
Thank you for coming in today.  I think you have a pinched nerve in your neck.   Take prednisone daily for 5 days.   Take the gabapentin mostly at bedtime for nerve pain.   If this medicine does not help.  Let me know. I think we will do some PT and consider an MRI.   Please get an Xray today before you leave   Recheck in 1 month.  Keep me updated.  Return sooner if needed.

## 2020-08-30 DIAGNOSIS — M9903 Segmental and somatic dysfunction of lumbar region: Secondary | ICD-10-CM | POA: Diagnosis not present

## 2020-08-30 DIAGNOSIS — M62830 Muscle spasm of back: Secondary | ICD-10-CM | POA: Diagnosis not present

## 2020-08-30 DIAGNOSIS — M9901 Segmental and somatic dysfunction of cervical region: Secondary | ICD-10-CM | POA: Diagnosis not present

## 2020-08-30 DIAGNOSIS — M5032 Other cervical disc degeneration, mid-cervical region, unspecified level: Secondary | ICD-10-CM | POA: Diagnosis not present

## 2020-09-02 DIAGNOSIS — M62830 Muscle spasm of back: Secondary | ICD-10-CM | POA: Diagnosis not present

## 2020-09-02 DIAGNOSIS — M5032 Other cervical disc degeneration, mid-cervical region, unspecified level: Secondary | ICD-10-CM | POA: Diagnosis not present

## 2020-09-02 DIAGNOSIS — M9901 Segmental and somatic dysfunction of cervical region: Secondary | ICD-10-CM | POA: Diagnosis not present

## 2020-09-02 DIAGNOSIS — M9903 Segmental and somatic dysfunction of lumbar region: Secondary | ICD-10-CM | POA: Diagnosis not present

## 2020-09-04 ENCOUNTER — Other Ambulatory Visit: Payer: Self-pay

## 2020-09-04 ENCOUNTER — Ambulatory Visit (INDEPENDENT_AMBULATORY_CARE_PROVIDER_SITE_OTHER): Payer: Medicare HMO | Admitting: Ophthalmology

## 2020-09-04 ENCOUNTER — Encounter (INDEPENDENT_AMBULATORY_CARE_PROVIDER_SITE_OTHER): Payer: Self-pay | Admitting: Ophthalmology

## 2020-09-04 DIAGNOSIS — I10 Essential (primary) hypertension: Secondary | ICD-10-CM | POA: Diagnosis not present

## 2020-09-04 DIAGNOSIS — H3581 Retinal edema: Secondary | ICD-10-CM | POA: Diagnosis not present

## 2020-09-04 DIAGNOSIS — H43813 Vitreous degeneration, bilateral: Secondary | ICD-10-CM | POA: Diagnosis not present

## 2020-09-04 DIAGNOSIS — H59021 Cataract (lens) fragments in eye following cataract surgery, right eye: Secondary | ICD-10-CM

## 2020-09-04 DIAGNOSIS — Z961 Presence of intraocular lens: Secondary | ICD-10-CM

## 2020-09-04 DIAGNOSIS — H35033 Hypertensive retinopathy, bilateral: Secondary | ICD-10-CM

## 2020-09-04 DIAGNOSIS — H401132 Primary open-angle glaucoma, bilateral, moderate stage: Secondary | ICD-10-CM | POA: Diagnosis not present

## 2020-09-04 DIAGNOSIS — H04221 Epiphora due to insufficient drainage, right lacrimal gland: Secondary | ICD-10-CM | POA: Diagnosis not present

## 2020-09-04 NOTE — Progress Notes (Signed)
[facility] Note  09/04/2020     CHIEF COMPLAINT Patient presents for Retina Evaluation   HISTORY OF PRESENT ILLNESS: Nicole Holt is a 79 y.o. female who presents to the clinic today for:   HPI    Retina Evaluation    In right eye.  This started 1 week ago.  Duration of 1 week.  Associated Symptoms Floaters and Shoulder/Hip pain.  Negative for Flashes.  I, the attending physician,  performed the HPI with the patient and updated documentation appropriately.          Comments    Patient here for Retina Evaluation. Referred by Dr Venetia Maxon. Patient states had cataract surgery OD and istent OD on October 25,2021. A piece of the cataract still in OD broke off. Its huge. No eye pain. Just stings.  Pt seen Dr. Venetia Maxon this morning.  Her IOP 40 OD.  He sent her right over to our office.  Pt has had glaucoma since 1990's.  Taking Travatan Z qhs OU, Bromfenac BID OD, Ocuflox QID OD, and Prednisolone QID OD.          Last edited by Bernarda Caffey, MD on 09/04/2020 11:07 PM. (History)    pt is here on the referral of Dr. Venetia Maxon for retained lens material, pt states she had cataract sx OD with him last Monday, October 25, pt states when she went back to her post appt the next day her IOP was okay, she feels like her pressure may have spiked last night bc she had a headache last night and she doesn't not usually get headaches, pt states Dr. Venetia Maxon told her that during sx a piece of her lens broke off and fell back into her eye, pt states Dr. Venetia Maxon told her he could not perform that sx and a different dr would have to do it, pt states this morning she was having so much pain and pressure that she told her husband that something was not right and she wanted to go back to the dr  Referring physician: Marylynn Pearson, MD [address],  [place] [id number]  HISTORICAL INFORMATION:   Selected notes from the MEDICAL RECORD NUMBER Referred by Dr. Venetia Maxon  for retained lens material OD LEE:  Ocular Hx- PMH-    CURRENT MEDICATIONS: Current Outpatient Medications (Ophthalmic Drugs)  Medication Sig  . Travoprost, BAK Free, (TRAVATAN) 0.004 % SOLN ophthalmic solution Place 1 drop into both eyes at bedtime.   No current facility-administered medications for this visit. (Ophthalmic Drugs)   Current Outpatient Medications (Other)  Medication Sig  . diazepam (VALIUM) 5 MG tablet Take 1 tablet (5 mg total) by mouth every 6 (six) hours as needed. for anxiety  . fluticasone (FLONASE) 50 MCG/ACT nasal spray Use 2 spray(s) in each nostril once daily  . gabapentin (NEURONTIN) 100 MG capsule Take 1-3 capsules (100-300 mg total) by mouth 3 (three) times daily as needed (nerve pain).  . Melatonin 10 MG TABS Take by mouth.  . Multiple Vitamins-Minerals (CENTRUM ADULTS PO) Take by mouth.  . Omega-3 Fatty Acids (FISH OIL PO) Take by mouth.  . predniSONE (DELTASONE) 50 MG tablet Take 1 tablet (50 mg total) by mouth daily.  . Turmeric (QC TUMERIC COMPLEX PO) Take by mouth.   No current facility-administered medications for this visit. (Other)      REVIEW OF SYSTEMS: ROS    Positive for: Neurological, Eyes, Respiratory   Negative for: Constitutional, Gastrointestinal,  Skin, Genitourinary, Musculoskeletal, HENT, Endocrine, Cardiovascular, Psychiatric, Allergic/Imm, Heme/Lymph   Last edited by Leonie Douglas, COA on 09/04/2020  2:30 PM. (History)       ALLERGIES Allergies  Allergen Reactions  . Codeine Nausea Only  . Estrogens Conjugated Other (See Comments)    HA  . Paroxetine Hcl Other (See Comments)    Felt like she was "out of her head"    PAST MEDICAL HISTORY Past Medical History:  Diagnosis Date  . Allergy   . Anxiety    panic attack  . Cataract   . Depression   . GERD (gastroesophageal reflux disease)   . Glaucoma   . Glaucoma   . Hearing loss   . 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 Hyperlipidemia    . Migraines    ocular  . OSA on CPAP 02/02/2014   PSG 09-27-2012 RDI 18.9 and AHi 9.8, Epworth 15 .titrated to only 5 cm water.   . Osteoporosis   . Sleep apnea   . Tinnitus    Past Surgical History:  Procedure Laterality Date  . cataract surgery    . COSMETIC SURGERY      FAMILY HISTORY Family History  Problem Relation Age of Onset  . Osteoporosis Mother   . Stroke Mother   . Hyperlipidemia Mother   . Hypertension Mother   . Kidney disease Father   . Congestive Heart Failure Father   . Heart disease Father   . Hyperlipidemia Father   . Stroke Sister   . Parkinson's disease Sister   . Heart disease Sister   . Heart disease Maternal Grandmother   . Gout Maternal Grandmother   . Stroke Maternal Grandfather   . Stroke Paternal Grandmother   . Thrombosis Paternal Grandfather   . Early death Paternal Grandfather   . Hypertension Brother   . Diabetes Brother   . Arthritis/Rheumatoid Other   . Parkinson's disease Other   . Heart disease Other   . Obesity Daughter   . Breast cancer Neg Hx     SOCIAL HISTORY Social History   Tobacco Use  . Smoking status: Former Research scientist (life sciences)  . Smokeless tobacco: Never Used  . Tobacco comment: Quit in 1987  Vaping Use  . Vaping Use: Never used  Substance Use Topics  . Alcohol use: Yes    Alcohol/week: 12.0 standard drinks    Types: 12 Glasses of wine per week    Comment: wine or alcohol daily  . Drug use: No         OPHTHALMIC EXAM:  Base Eye Exam    Visual Acuity (Snellen - Linear)      Right Left   Dist [place] 20/150 20/30 +2   Dist ph [place] NI 20/20       Tonometry (Tonopen, 2:13 PM)      Right Left   Pressure 17 12       Pupils      Dark Light Shape React APD   Right 3 2 Round Brisk None   Left 3 2 Round Brisk None       Visual Fields (Counting fingers)      Left Right   Restrictions  Partial outer inferior temporal, inferior nasal deficiencies       Extraocular Movement      Right Left    Full Full        Neuro/Psych    Oriented x3: Yes   Mood/Affect: Normal  Dilation    Both eyes: 1.0% Mydriacyl, 2.5% Phenylephrine @ 2:13 PM        Slit Lamp and Fundus Exam    Slit Lamp Exam      Right Left   Lids/Lashes Dermatochalasis - upper lid Dermatochalasis - upper lid   Conjunctiva/Sclera 2-3+ Injection temporally White and quiet   Cornea Trace Punctate epithelial erosions, well healed temporal cataract wounds with 10-0 nylon suture at 0900, arcus, mild corneal haze 1+Punctate epithelial erosions, well healed temporal cataract wounds   Anterior Chamber Deep, 3+ Cell, mild cortical remnants, iStent at 0200 Deep and quiet, Deep   Iris Round and dilated Round and dilated   Lens 3 piece IOL in good position, open PC, cortical remnants nasal half of capsule PC IOL in good position with trace PCO non-central   Vitreous +Cell/debris; retained lens fragments -- mobile, settled inferiorly syneresis       Fundus Exam      Right Left   Disc hazy view, Pink and Sharp, mild PPA Pink and Sharp, mild tilt, IT PPA   C/D Ratio 0.6 0.5   Macula hazy view, grossly flat Flat, Blunted foveal reflex, mild RPE mottling, No heme or edema   Vessels hazy view, attenuated, Tortuous attenuated, Tortuous   Periphery hazy view, grossly attached, retianed lens material settled inferiorly Attached           Refraction    Manifest Refraction      Sphere Cylinder Axis Dist VA   Right [place]   NI   Left -1.50 +0.75 165 20/25+1  When I checked vision during refraction OD 20/200, OS 20/50          IMAGING AND PROCEDURES  Imaging and Procedures for 09/04/2020  OCT, Retina - OU - Both Eyes       Right Eye Quality was borderline. Central Foveal Thickness: 280. Progression has no prior data. Findings include normal foveal contour, no IRF, no SRF (+vitreous opacities).   Left Eye Quality was good. Central Foveal Thickness: 280. Progression has no prior data. Findings include normal foveal contour, no IRF, no  SRF.   Notes *Images captured and stored on drive  Diagnosis / Impression:  NFP, no IRF/SRF OU  Clinical management:  See below  Abbreviations: NFP - Normal foveal profile. CME - cystoid macular edema. PED - pigment epithelial detachment. IRF - intraretinal fluid. SRF - subretinal fluid. EZ - ellipsoid zone. ERM - epiretinal membrane. ORA - outer retinal atrophy. ORT - outer retinal tubulation. SRHM - subretinal hyper-reflective material. IRHM - intraretinal hyper-reflective material                ASSESSMENT/PLAN:    ICD-10-CM   1. Retained lens material following cataract surgery of right eye  H59.021   2. Retinal edema  H35.81 OCT, Retina - OU - Both Eyes  3. Essential hypertension  I10   4. Hypertensive retinopathy of both eyes  H35.033   5. Pseudophakia, both eyes  Z96.1     1. Retained lens material OD  - s/p CE, sulcus IOL on Monday 10.25.21  - broken posterior capsule, nuclear and cortical lens fragments in vitreous  - had IOP spike earlier today -- 40  - improved here post drops and diamox  - 3+ AC cell  - discussed findings, prognosis and possible need for surgery to remove lens fragments  - recommend medical management for now to reduce inflammation and control IOP  - recommend  ocuflox QID OD  PF Q1H OD    Cosopt BID OD    Bromsite QID OD    Travaprost QHS OU  - f/u tomorrow 10AM  2. No retinal edema on exam or OCT  3,4. Hypertensive retinopathy OU - discussed importance of tight BP control - monitor  5. Pseudophakia OU  - s/p CE/IOL (Dr. Venetia Maxon, OD: 10.25.21, OS: 12.2015)  - IOLs in good position  - retained lens material OD as above - monitor  Ophthalmic Meds Ordered this visit:  No orders of the defined types were placed in this encounter.      Return in about 1 day (around 09/05/2020) for f/u tomorrow, 10AM retianed lens material OD, DFE, OCT.  There are no Patient Instructions on file for this visit.   Explained the diagnoses,  plan, and follow up with the patient and they expressed understanding.  Patient expressed understanding of the importance of proper follow up care.   This document serves as a record of services personally performed by Gardiner Sleeper, MD, PhD. It was created on their behalf by San Jetty. Owens Shark, OA an ophthalmic technician. The creation of this record is the provider's dictation and/or activities during the visit.    Electronically signed by: San Jetty. Marguerita Merles 11.03.2021 11:26 PM   Gardiner Sleeper, M.D., Ph.D. Diseases & Surgery of the Retina and Vitreous [facility]  I have reviewed the above documentation for accuracy and completeness, and I agree with the above. Gardiner Sleeper, M.D., Ph.D. 09/04/20 11:26 PM   Abbreviations: M myopia (nearsighted); A astigmatism; H hyperopia (farsighted); P presbyopia; Mrx spectacle prescription;  CTL contact lenses; OD right eye; OS left eye; OU both eyes  XT exotropia; ET esotropia; PEK punctate epithelial keratitis; PEE punctate epithelial erosions; DES dry eye syndrome; MGD meibomian gland dysfunction; ATs artificial tears; PFAT's preservative free artificial tears; [place] nuclear sclerotic cataract; PSC posterior subcapsular cataract; ERM epi-retinal membrane; PVD posterior vitreous detachment; RD retinal detachment; DM diabetes mellitus; DR diabetic retinopathy; NPDR non-proliferative diabetic retinopathy; PDR proliferative diabetic retinopathy; CSME clinically significant macular edema; DME diabetic macular edema; dbh dot blot hemorrhages; CWS cotton wool spot; POAG primary open angle glaucoma; C/D cup-to-disc ratio; HVF humphrey visual field; GVF goldmann visual field; OCT optical coherence tomography; IOP intraocular pressure; BRVO Branch retinal vein occlusion; CRVO central retinal vein occlusion; CRAO central retinal artery occlusion; BRAO branch retinal artery occlusion; RT retinal tear; SB scleral buckle; PPV pars plana vitrectomy;  VH Vitreous hemorrhage; PRP panretinal laser photocoagulation; IVK intravitreal kenalog; VMT vitreomacular traction; MH Macular hole;  NVD neovascularization of the disc; NVE neovascularization elsewhere; AREDS age related eye disease study; ARMD age related macular degeneration; POAG primary open angle glaucoma; EBMD epithelial/anterior basement membrane dystrophy; ACIOL anterior chamber intraocular lens; IOL intraocular lens; PCIOL posterior chamber intraocular lens; Phaco/IOL phacoemulsification with intraocular lens placement; [place] photorefractive keratectomy; LASIK laser assisted in situ keratomileusis; HTN hypertension; DM diabetes mellitus; COPD chronic obstructive pulmonary disease

## 2020-09-05 ENCOUNTER — Encounter (INDEPENDENT_AMBULATORY_CARE_PROVIDER_SITE_OTHER): Payer: Self-pay | Admitting: Ophthalmology

## 2020-09-05 ENCOUNTER — Ambulatory Visit (INDEPENDENT_AMBULATORY_CARE_PROVIDER_SITE_OTHER): Payer: Medicare HMO | Admitting: Ophthalmology

## 2020-09-05 DIAGNOSIS — I10 Essential (primary) hypertension: Secondary | ICD-10-CM | POA: Diagnosis not present

## 2020-09-05 DIAGNOSIS — H3581 Retinal edema: Secondary | ICD-10-CM | POA: Diagnosis not present

## 2020-09-05 DIAGNOSIS — H59021 Cataract (lens) fragments in eye following cataract surgery, right eye: Secondary | ICD-10-CM

## 2020-09-05 DIAGNOSIS — Z961 Presence of intraocular lens: Secondary | ICD-10-CM

## 2020-09-05 DIAGNOSIS — H35033 Hypertensive retinopathy, bilateral: Secondary | ICD-10-CM | POA: Diagnosis not present

## 2020-09-05 NOTE — Progress Notes (Addendum)
[facility] Note  09/05/2020     CHIEF COMPLAINT Patient presents for Retina Follow Up   HISTORY OF PRESENT ILLNESS: Nicole Holt is a 79 y.o. female who presents to the clinic today for:   HPI    Retina Follow Up    Patient presents with  Other.  In right eye.  Duration of 1 day.  I, the attending physician,  performed the HPI with the patient and updated documentation appropriately.          Comments    IOP check today, retained lens material OD.  This morning pt has used: Ofloxacin, Prednisolone x2, Dorzolamide, and Bromfenac prior to appt.        Last edited by Nicole Caffey, MD on 09/05/2020  4:11 PM. (History)    pt states last night went really well, she was able to sleep and did not have a headache or pressure around her eyes, pt was able to keep up with the drops as directed  Referring physician: Vivi Barrack, MD [place],  [place] [address]  HISTORICAL INFORMATION:   Selected notes from the MEDICAL RECORD NUMBER Referred by Dr. Venetia Maxon for retained lens material OD LEE:  Ocular Hx- PMH-    CURRENT MEDICATIONS: Current Outpatient Medications (Ophthalmic Drugs)  Medication Sig  . Travoprost, BAK Free, (TRAVATAN) 0.004 % SOLN ophthalmic solution Place 1 drop into both eyes at bedtime.   No current facility-administered medications for this visit. (Ophthalmic Drugs)   Current Outpatient Medications (Other)  Medication Sig  . diazepam (VALIUM) 5 MG tablet Take 1 tablet (5 mg total) by mouth every 6 (six) hours as needed. for anxiety  . fluticasone (FLONASE) 50 MCG/ACT nasal spray Use 2 spray(s) in each nostril once daily  . gabapentin (NEURONTIN) 100 MG capsule Take 1-3 capsules (100-300 mg total) by mouth 3 (three) times daily as needed (nerve pain).  . Melatonin 10 MG TABS Take by mouth.  . Multiple Vitamins-Minerals (CENTRUM ADULTS PO) Take by mouth.  . Omega-3 Fatty Acids (FISH OIL PO) Take by mouth.  .  predniSONE (DELTASONE) 50 MG tablet Take 1 tablet (50 mg total) by mouth daily.  . Turmeric (QC TUMERIC COMPLEX PO) Take by mouth.   No current facility-administered medications for this visit. (Other)      REVIEW OF SYSTEMS: ROS    Positive for: Neurological, Eyes, Respiratory   Negative for: Constitutional, Gastrointestinal, Skin, Genitourinary, Musculoskeletal, HENT, Endocrine, Cardiovascular, Psychiatric, Allergic/Imm, Heme/Lymph   Last edited by Nicole Holt, COA on 09/05/2020 10:10 AM. (History)       ALLERGIES Allergies  Allergen Reactions  . Codeine Nausea Only  . Estrogens Conjugated Other (See Comments)    HA  . Paroxetine Hcl Other (See Comments)    Felt like she was "out of her head"    PAST MEDICAL HISTORY Past Medical History:  Diagnosis Date  . Allergy   . Anxiety    panic attack  . Cataract   . Depression   . GERD (gastroesophageal reflux disease)   . Glaucoma   . Glaucoma   . Hearing loss   . 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 Hyperlipidemia   . Migraines    ocular  . OSA on CPAP 02/02/2014   PSG 09-27-2012 RDI 18.9 and AHi 9.8, Epworth 15 .titrated to only 5 cm water.   . Osteoporosis   . Sleep apnea   . Tinnitus  Past Surgical History:  Procedure Laterality Date  . cataract surgery    . COSMETIC SURGERY      FAMILY HISTORY Family History  Problem Relation Age of Onset  . Osteoporosis Mother   . Stroke Mother   . Hyperlipidemia Mother   . Hypertension Mother   . Kidney disease Father   . Congestive Heart Failure Father   . Heart disease Father   . Hyperlipidemia Father   . Stroke Sister   . Parkinson's disease Sister   . Heart disease Sister   . Heart disease Maternal Grandmother   . Gout Maternal Grandmother   . Stroke Maternal Grandfather   . Stroke Paternal Grandmother   . Thrombosis Paternal Grandfather   . Early death Paternal Grandfather   . Hypertension Brother   . Diabetes Brother   .  Arthritis/Rheumatoid Other   . Parkinson's disease Other   . Heart disease Other   . Obesity Daughter   . Breast cancer Neg Hx     SOCIAL HISTORY Social History   Tobacco Use  . Smoking status: Former Research scientist (life sciences)  . Smokeless tobacco: Never Used  . Tobacco comment: Quit in 1987  Vaping Use  . Vaping Use: Never used  Substance Use Topics  . Alcohol use: Yes    Alcohol/week: 12.0 standard drinks    Types: 12 Glasses of wine per week    Comment: wine or alcohol daily  . Drug use: No         OPHTHALMIC EXAM:  Base Eye Exam    Visual Acuity (Snellen - Linear)      Right Left   Dist [place] 20/100-2+2 20/50 -2   Dist ph [place] NI 20/25 -2       Tonometry (Tonopen, 10:18 AM)      Right Left   Pressure 31 14  Checked IOP twice OD 31 both times       Pupils      Dark Light Shape React APD   Right 3 2 Irregular Minimal None   Left 2 1 Round Slow None       Neuro/Psych    Oriented x3: Yes   Mood/Affect: Normal       Dilation    Both eyes: 1.0% Mydriacyl, 2.5% Phenylephrine @ 10:18 AM        Slit Lamp and Fundus Exam    Slit Lamp Exam      Right Left   Lids/Lashes Dermatochalasis - upper lid Dermatochalasis - upper lid   Conjunctiva/Sclera 1+ Injection temporally White and quiet   Cornea Trace Punctate epithelial erosions, well healed temporal cataract wounds with 10-0 nylon suture at 0900, arcus, mild corneal haze 1+Punctate epithelial erosions, well healed temporal cataract wounds   Anterior Chamber Deep, 3+ Cell, mild cortical remnants, iStent at 0200 Deep and quiet, Deep   Iris Round and dilated Round and dilated   Lens 3 piece IOL in good position, open PC, cortical remnants nasal half of capsule PC IOL in good position with trace PCO non-central   Vitreous +Cell/debris; retained lens fragments -- mobile, settled inferiorly syneresis       Fundus Exam      Right Left   Disc hazy view improving, Pink and Sharp, +cupping, mild PPP Pink and Sharp, mild tilt, IT PPA    C/D Ratio 0.6 0.5   Macula hazy view improving, grossly flat, No heme or edema Flat, Blunted foveal reflex, mild RPE mottling, No heme or edema   Vessels hazy view  improving, attenuated, Tortuous attenuated, Tortuous   Periphery hazy view improving, grossly attached, retained lens material mobile, settled inferiorly Attached             IMAGING AND PROCEDURES  Imaging and Procedures for 09/05/2020  OCT, Retina - OU - Both Eyes       Right Eye Quality was borderline. Central Foveal Thickness: 283. Progression has been stable. Findings include normal foveal contour, no IRF, no SRF (+vitreous opacities).   Left Eye Quality was good. Central Foveal Thickness: 281. Progression has been stable. Findings include normal foveal contour, no IRF, no SRF.   Notes *Images captured and stored on drive  Diagnosis / Impression:  NFP, no IRF/SRF OU  Clinical management:  See below  Abbreviations: NFP - Normal foveal profile. CME - cystoid macular edema. PED - pigment epithelial detachment. IRF - intraretinal fluid. SRF - subretinal fluid. EZ - ellipsoid zone. ERM - epiretinal membrane. ORA - outer retinal atrophy. ORT - outer retinal tubulation. SRHM - subretinal hyper-reflective material. IRHM - intraretinal hyper-reflective material                ASSESSMENT/PLAN:    ICD-10-CM   1. Retained lens material following cataract surgery of right eye  H59.021   2. Retinal edema  H35.81 OCT, Retina - OU - Both Eyes  3. Essential hypertension  I10   4. Hypertensive retinopathy of both eyes  H35.033   5. Pseudophakia, both eyes  Z96.1     1. Retained lens material OD  - s/p CE, sulcus IOL on Monday 10.25.21  - broken posterior capsule, nuclear and cortical lens fragments in vitreous  - had IOP spike to 40 on 11.3.21  - improved post drops, AC tap and diamox by Dr. Venetia Maxon  - 3+ AC cell  - discussed findings, prognosis and possible need for surgery to remove lens fragments  -  recommend medical management for now to reduce inflammation and control IOP  - continue  ocuflox QID OD    PF Q1H OD    Bromsite QID OD    Travaprost QHS OU  - increase Cosopt to TID OD  - start  Brimonidine TID OD  - will plan for PPV w/ removal of retained lens material next week -- Thursday, 11.11.21 -- can cancel if sx ends up not being necessary  - f/u tomorrow 8:15 AM  2. No retinal edema on exam or OCT  3,4. Hypertensive retinopathy OU - discussed importance of tight BP control - monitor  5. Pseudophakia OU  - s/p CE/IOL (Dr. Venetia Maxon, OD: 10.25.21, OS: 12.2015)  - IOLs in good position  - retained lens material OD as above - monitor  Ophthalmic Meds Ordered this visit:  No orders of the defined types were placed in this encounter.      Return in about 1 day (around 09/06/2020) for f/u 8:15 am retianed lens material OD.  There are no Patient Instructions on file for this visit.   Explained the diagnoses, plan, and follow up with the patient and they expressed understanding.  Patient expressed understanding of the importance of proper follow up care.   This document serves as a record of services personally performed by Gardiner Sleeper, MD, PhD. It was created on their behalf by San Jetty. Owens Shark, OA an ophthalmic technician. The creation of this record is the provider's dictation and/or activities during the visit.    Electronically signed by: San Jetty. [place], [place] 11.04.2021 11:04 AM   Aaron Edelman  Antony Haste, M.D., Ph.D. Diseases & Surgery of the Retina and Vitreous [facility]  I have reviewed the above documentation for accuracy and completeness, and I agree with the above. Gardiner Sleeper, M.D., Ph.D. 09/05/20 4:21 PM    Abbreviations: M myopia (nearsighted); A astigmatism; H hyperopia (farsighted); P presbyopia; Mrx spectacle prescription;  CTL contact lenses; OD right eye; OS left eye; OU both eyes  XT exotropia; ET esotropia; PEK punctate  epithelial keratitis; PEE punctate epithelial erosions; DES dry eye syndrome; MGD meibomian gland dysfunction; ATs artificial tears; PFAT's preservative free artificial tears; [place] nuclear sclerotic cataract; PSC posterior subcapsular cataract; ERM epi-retinal membrane; PVD posterior vitreous detachment; RD retinal detachment; DM diabetes mellitus; DR diabetic retinopathy; NPDR non-proliferative diabetic retinopathy; PDR proliferative diabetic retinopathy; CSME clinically significant macular edema; DME diabetic macular edema; dbh dot blot hemorrhages; CWS cotton wool spot; POAG primary open angle glaucoma; C/D cup-to-disc ratio; HVF humphrey visual field; GVF goldmann visual field; OCT optical coherence tomography; IOP intraocular pressure; BRVO Branch retinal vein occlusion; CRVO central retinal vein occlusion; CRAO central retinal artery occlusion; BRAO branch retinal artery occlusion; RT retinal tear; SB scleral buckle; PPV pars plana vitrectomy; VH Vitreous hemorrhage; PRP panretinal laser photocoagulation; IVK intravitreal kenalog; VMT vitreomacular traction; MH Macular hole;  NVD neovascularization of the disc; NVE neovascularization elsewhere; AREDS age related eye disease study; ARMD age related macular degeneration; POAG primary open angle glaucoma; EBMD epithelial/anterior basement membrane dystrophy; ACIOL anterior chamber intraocular lens; IOL intraocular lens; PCIOL posterior chamber intraocular lens; Phaco/IOL phacoemulsification with intraocular lens placement; [place] photorefractive keratectomy; LASIK laser assisted in situ keratomileusis; HTN hypertension; DM diabetes mellitus; COPD chronic obstructive pulmonary disease

## 2020-09-06 ENCOUNTER — Encounter (INDEPENDENT_AMBULATORY_CARE_PROVIDER_SITE_OTHER): Payer: Self-pay | Admitting: Ophthalmology

## 2020-09-06 ENCOUNTER — Other Ambulatory Visit: Payer: Self-pay

## 2020-09-06 ENCOUNTER — Ambulatory Visit (INDEPENDENT_AMBULATORY_CARE_PROVIDER_SITE_OTHER): Payer: Medicare HMO | Admitting: Ophthalmology

## 2020-09-06 DIAGNOSIS — H35033 Hypertensive retinopathy, bilateral: Secondary | ICD-10-CM | POA: Diagnosis not present

## 2020-09-06 DIAGNOSIS — H59021 Cataract (lens) fragments in eye following cataract surgery, right eye: Secondary | ICD-10-CM

## 2020-09-06 DIAGNOSIS — H3581 Retinal edema: Secondary | ICD-10-CM | POA: Diagnosis not present

## 2020-09-06 DIAGNOSIS — I10 Essential (primary) hypertension: Secondary | ICD-10-CM | POA: Diagnosis not present

## 2020-09-06 DIAGNOSIS — Z961 Presence of intraocular lens: Secondary | ICD-10-CM

## 2020-09-06 MED ORDER — ACETAZOLAMIDE ER 500 MG PO CP12: 500.0000 mg | ORAL_CAPSULE | Freq: Two times a day (BID) | ORAL | 0 refills | Status: DC

## 2020-09-06 NOTE — Progress Notes (Signed)
[facility] Note  09/06/2020     CHIEF COMPLAINT Patient presents for Retina Follow Up   HISTORY OF PRESENT ILLNESS: Nicole Holt is a 79 y.o. female who presents to the clinic today for:   HPI    Retina Follow Up    Patient presents with  Other.  In right eye.  This started weeks ago.  Severity is moderate.  Duration of 1 day.  Since onset it is gradually worsening.  I, the attending physician,  performed the HPI with the patient and updated documentation appropriately.          Comments    79 y/o female pt here for 1 day f/u for retained lens material OD.  No change in [place] OU.  Denies pain, FOL, but still has large floaters OD.  Ocuflox QID OD PF Q1H OD Bromsite QID OD Travoprost QHS OU Cosopt TID OD Brimonidine TID OD       Last edited by Bernarda Caffey, MD on 09/06/2020  8:48 AM. (History)    Pt woke up this morning with "stinging" in her right eye.    Referring physician: Vivi Barrack, MD [place],  [place] [address]  HISTORICAL INFORMATION:   Selected notes from the MEDICAL RECORD NUMBER Referred by Dr. Venetia Maxon for retained lens material OD LEE:  Ocular Hx- PMH-    CURRENT MEDICATIONS: Current Outpatient Medications (Ophthalmic Drugs)  Medication Sig  . ofloxacin (OCUFLOX) 0.3 % ophthalmic solution   . prednisoLONE acetate (PRED FORTE) 1 % ophthalmic suspension   . Travoprost, BAK Free, (TRAVATAN) 0.004 % SOLN ophthalmic solution Place 1 drop into both eyes at bedtime.   No current facility-administered medications for this visit. (Ophthalmic Drugs)   Current Outpatient Medications (Other)  Medication Sig  . diazepam (VALIUM) 5 MG tablet Take 1 tablet (5 mg total) by mouth every 6 (six) hours as needed. for anxiety  . fluticasone (FLONASE) 50 MCG/ACT nasal spray Use 2 spray(s) in each nostril once daily  . gabapentin (NEURONTIN) 100 MG capsule Take 1-3 capsules (100-300 mg total) by mouth 3 (three) times daily as  needed (nerve pain).  . Melatonin 10 MG TABS Take by mouth.  . Multiple Vitamins-Minerals (CENTRUM ADULTS PO) Take by mouth.  . Omega-3 Fatty Acids (FISH OIL PO) Take by mouth.  . predniSONE (DELTASONE) 50 MG tablet Take 1 tablet (50 mg total) by mouth daily.  . Turmeric (QC TUMERIC COMPLEX PO) Take by mouth.  Marland Kitchen acetaZOLAMIDE (DIAMOX SEQUELS) 500 MG capsule Take 1 capsule (500 mg total) by mouth 2 (two) times daily.   No current facility-administered medications for this visit. (Other)      REVIEW OF SYSTEMS: ROS    Positive for: Neurological, Musculoskeletal, HENT, Eyes, Respiratory   Negative for: Constitutional, Gastrointestinal, Skin, Genitourinary, Endocrine, Cardiovascular, Psychiatric, Allergic/Imm, Heme/Lymph   Last edited by Matthew Folks, COA on 09/06/2020  8:39 AM. (History)       ALLERGIES Allergies  Allergen Reactions  . Codeine Nausea Only  . Estrogens Conjugated Other (See Comments)    HA  . Paroxetine Hcl Other (See Comments)    Felt like she was "out of her head"    PAST MEDICAL HISTORY Past Medical History:  Diagnosis Date  . Allergy   . Anxiety    panic attack  . Depression   . GERD (gastroesophageal reflux disease)   . Glaucoma   . Glaucoma   . Hearing loss   .  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 Hyperlipidemia   . Hypertensive retinopathy    OU  . Migraines    ocular  . OSA on CPAP 02/02/2014   PSG 09-27-2012 RDI 18.9 and AHi 9.8, Epworth 15 .titrated to only 5 cm water.   . Osteoporosis   . Sleep apnea   . Tinnitus    Past Surgical History:  Procedure Laterality Date  . CATARACT EXTRACTION Bilateral OD: 10.25.21 OS: 12.2015   Dr. Venetia Maxon  . cataract surgery    . COSMETIC SURGERY    . EYE SURGERY Bilateral    Cat Sx - Dr. Venetia Maxon    FAMILY HISTORY Family History  Problem Relation Age of Onset  . Osteoporosis Mother   . Stroke Mother   . Hyperlipidemia Mother   . Hypertension Mother   . Kidney  disease Father   . Congestive Heart Failure Father   . Heart disease Father   . Hyperlipidemia Father   . Stroke Sister   . Parkinson's disease Sister   . Heart disease Sister   . Heart disease Maternal Grandmother   . Gout Maternal Grandmother   . Stroke Maternal Grandfather   . Stroke Paternal Grandmother   . Thrombosis Paternal Grandfather   . Early death Paternal Grandfather   . Hypertension Brother   . Diabetes Brother   . Arthritis/Rheumatoid Other   . Parkinson's disease Other   . Heart disease Other   . Obesity Daughter   . Breast cancer Neg Hx     SOCIAL HISTORY Social History   Tobacco Use  . Smoking status: Former Research scientist (life sciences)  . Smokeless tobacco: Never Used  . Tobacco comment: Quit in 1987  Vaping Use  . Vaping Use: Never used  Substance Use Topics  . Alcohol use: Yes    Alcohol/week: 12.0 standard drinks    Types: 12 Glasses of wine per week    Comment: wine or alcohol daily  . Drug use: No         OPHTHALMIC EXAM:  Base Eye Exam    Visual Acuity (Snellen - Linear)      Right Left   Dist [place] 20/200 -2 20/30 -2   Dist ph [place] NI 20/20 -2       Tonometry (Tonopen, 8:42 AM)      Right Left   Pressure 40 15       Tonometry #2 (Tonopen, 9:13 AM)      Right Left   Pressure 46        Tonometry #3 (Tonopen, 9:26 AM)      Right Left   Pressure 5        Pupils      Dark Light Shape React APD   Right 3 2 Round Minimal None   Left 2 1 Round Minimal None       Visual Fields (Counting fingers)      Left Right    Full    Restrictions  Partial outer inferior temporal, inferior nasal deficiencies       Extraocular Movement      Right Left    Full, Ortho Full, Ortho       Neuro/Psych    Oriented x3: Yes   Mood/Affect: Normal       Dilation    Right eye: 1.0% Mydriacyl, 2.5% Phenylephrine @ 8:42 AM        Slit Lamp and Fundus Exam    Slit Lamp Exam  Right Left   Lids/Lashes Dermatochalasis - upper lid Dermatochalasis - upper lid    Conjunctiva/Sclera trace Injection temporally White and quiet   Cornea Trace Punctate epithelial erosions, well healed temporal cataract wounds with 10-0 nylon suture at 0900, arcus, micro cystic edema, mild corneal haze 1+Punctate epithelial erosions, well healed temporal cataract wounds   Anterior Chamber Deep, 3+ Cell, mild cortical remnants, iStent at 0200 Deep and quiet, Deep   Iris Round and dilated Round and dilated   Lens 3 piece IOL in good position, open PC, cortical remnants nasal half of capsule PC IOL in good position with trace PCO non-central   Vitreous +Cell/debris; retained lens fragments -- mobile, settled inferiorly syneresis       Fundus Exam      Right Left   Disc hazy view, Pink and Sharp, +cupping, mild PPP Pink and Sharp, mild tilt, IT PPA   C/D Ratio 0.6 0.5   Macula hazy view, grossly flat, No heme or edema Flat, Blunted foveal reflex, mild RPE mottling, No heme or edema   Vessels hazy view, attenuated, Tortuous attenuated, Tortuous   Periphery hazy view, grossly attached, retained lens material mobile, settled inferiorly Attached             IMAGING AND PROCEDURES  Imaging and Procedures for 09/06/2020  OCT, Retina - OU - Both Eyes       Right Eye Quality was poor. Central Foveal Thickness: 400. Progression has been stable. Findings include normal foveal contour, no IRF, no SRF (+vitreous opacities).   Left Eye Quality was good. Central Foveal Thickness: 279. Progression has been stable. Findings include normal foveal contour, no IRF, no SRF.   Notes *Images captured and stored on drive  Diagnosis / Impression:  NFP, no IRF/SRF OU - hazier view OD today  Clinical management:  See below  Abbreviations: NFP - Normal foveal profile. CME - cystoid macular edema. PED - pigment epithelial detachment. IRF - intraretinal fluid. SRF - subretinal fluid. EZ - ellipsoid zone. ERM - epiretinal membrane. ORA - outer retinal atrophy. ORT - outer retinal  tubulation. SRHM - subretinal hyper-reflective material. IRHM - intraretinal hyper-reflective material                ASSESSMENT/PLAN:    ICD-10-CM   1. Retained lens material following cataract surgery of right eye  H59.021   2. Retinal edema  H35.81 OCT, Retina - OU - Both Eyes  3. Essential hypertension  I10   4. Hypertensive retinopathy of both eyes  H35.033   5. Pseudophakia, both eyes  Z96.1     1. Retained lens material OD  - s/p CE, sulcus IOL on Monday 10.25.21  - broken posterior capsule, nuclear and cortical lens fragments in vitreous  - had IOP spike to 40 on 11.3.21  - improved post drops, AC tap and diamox by Dr. Venetia Maxon  - 3+ AC cell  - today, IOP back up to 40+ and with microcystic corneal edema  - IOP improved after 1 dose of po diamox 250mg  and AC tap  - discussed findings, prognosis and possible need for surgery to remove lens fragments  - recommend medical management for now to reduce inflammation and control IOP  - continue  ocuflox QID OD    PF Q2H OD    Bromsite QID OD    Travaprost QHS OU    Cosopt TID OD    Brimonidine TID OD             -  start  po Diamox sequels 500mg  PO BID  - will plan for PPV w/ removal of retained lens material next week -- Thursday, 11.11.21 -- can cancel if sx ends up not being necessary  - f/u Monday 8:00AM  2. No retinal edema on exam or OCT  3,4. Hypertensive retinopathy OU - discussed importance of tight BP control - monitor  5. Pseudophakia OU  - s/p CE/IOL (Dr. Venetia Maxon, OD: 10.25.21, OS: 12.2015)  - IOLs in good position  - retained lens material OD as above - monitor  Ophthalmic Meds Ordered this visit:  Meds ordered this encounter  Medications  . acetaZOLAMIDE (DIAMOX SEQUELS) 500 MG capsule    Sig: Take 1 capsule (500 mg total) by mouth 2 (two) times daily.    Dispense:  60 capsule    Refill:  0       Return in about 3 days (around 09/09/2020) for Dilated Exam, OCT.  There are no Patient  Instructions on file for this visit.   Explained the diagnoses, plan, and follow up with the patient and they expressed understanding.  Patient expressed understanding of the importance of proper follow up care.   This document serves as a record of services personally performed by Gardiner Sleeper, MD, PhD. It was created on their behalf by Leeann Must, [place], an ophthalmic technician. The creation of this record is the provider's dictation and/or activities during the visit.    Electronically signed by: Leeann Must, COA @TODAY @ 8:47 PM  Gardiner Sleeper, M.D., Ph.D. Diseases & Surgery of the Retina and [place] 09/06/2020   I have reviewed the above documentation for accuracy and completeness, and I agree with the above. Gardiner Sleeper, M.D., Ph.D. 09/08/20 8:47 PM   Abbreviations: M myopia (nearsighted); A astigmatism; H hyperopia (farsighted); P presbyopia; Mrx spectacle prescription;  CTL contact lenses; OD right eye; OS left eye; OU both eyes  XT exotropia; ET esotropia; PEK punctate epithelial keratitis; PEE punctate epithelial erosions; DES dry eye syndrome; MGD meibomian gland dysfunction; ATs artificial tears; PFAT's preservative free artificial tears; [employer] nuclear sclerotic cataract; PSC posterior subcapsular cataract; ERM epi-retinal membrane; PVD posterior vitreous detachment; RD retinal detachment; DM diabetes mellitus; DR diabetic retinopathy; NPDR non-proliferative diabetic retinopathy; PDR proliferative diabetic retinopathy; CSME clinically significant macular edema; DME diabetic macular edema; dbh dot blot hemorrhages; CWS cotton wool spot; POAG primary open angle glaucoma; C/D cup-to-disc ratio; HVF humphrey visual field; GVF goldmann visual field; OCT optical coherence tomography; IOP intraocular pressure; BRVO Branch retinal vein occlusion; CRVO central retinal vein occlusion; CRAO central retinal artery occlusion; BRAO branch retinal artery  occlusion; RT retinal tear; SB scleral buckle; PPV pars plana vitrectomy; VH Vitreous hemorrhage; PRP panretinal laser photocoagulation; IVK intravitreal kenalog; VMT vitreomacular traction; MH Macular hole;  NVD neovascularization of the disc; NVE neovascularization elsewhere; AREDS age related eye disease study; ARMD age related macular degeneration; POAG primary open angle glaucoma; EBMD epithelial/anterior basement membrane dystrophy; ACIOL anterior chamber intraocular lens; IOL intraocular lens; PCIOL posterior chamber intraocular lens; Phaco/IOL phacoemulsification with intraocular lens placement; [place] photorefractive keratectomy; LASIK laser assisted in situ keratomileusis; HTN hypertension; DM diabetes mellitus; COPD chronic obstructive pulmonary disease

## 2020-09-09 ENCOUNTER — Encounter (INDEPENDENT_AMBULATORY_CARE_PROVIDER_SITE_OTHER): Payer: Self-pay | Admitting: Ophthalmology

## 2020-09-09 ENCOUNTER — Other Ambulatory Visit (HOSPITAL_COMMUNITY)
Admission: RE | Admit: 2020-09-09 | Discharge: 2020-09-09 | Disposition: A | Discharge status: Home / Self Care | Payer: Medicare HMO | Source: Ambulatory Visit | Attending: Ophthalmology | Admitting: Ophthalmology

## 2020-09-09 ENCOUNTER — Other Ambulatory Visit: Payer: Self-pay

## 2020-09-09 ENCOUNTER — Ambulatory Visit (INDEPENDENT_AMBULATORY_CARE_PROVIDER_SITE_OTHER): Payer: Medicare HMO | Admitting: Ophthalmology

## 2020-09-09 DIAGNOSIS — Z20822 Contact with and (suspected) exposure to covid-19: Secondary | ICD-10-CM | POA: Diagnosis not present

## 2020-09-09 DIAGNOSIS — Z961 Presence of intraocular lens: Secondary | ICD-10-CM

## 2020-09-09 DIAGNOSIS — H59021 Cataract (lens) fragments in eye following cataract surgery, right eye: Secondary | ICD-10-CM

## 2020-09-09 DIAGNOSIS — H3581 Retinal edema: Secondary | ICD-10-CM

## 2020-09-09 DIAGNOSIS — H40051 Ocular hypertension, right eye: Secondary | ICD-10-CM | POA: Diagnosis not present

## 2020-09-09 DIAGNOSIS — I10 Essential (primary) hypertension: Secondary | ICD-10-CM | POA: Diagnosis not present

## 2020-09-09 DIAGNOSIS — Z01812 Encounter for preprocedural laboratory examination: Secondary | ICD-10-CM | POA: Insufficient documentation

## 2020-09-09 DIAGNOSIS — H35033 Hypertensive retinopathy, bilateral: Secondary | ICD-10-CM | POA: Diagnosis not present

## 2020-09-09 LAB — SARS CORONAVIRUS 2 (TAT 6-24 HRS): SARS Coronavirus 2: NEGATIVE

## 2020-09-09 NOTE — Progress Notes (Signed)
[facility] Note  09/09/2020     CHIEF COMPLAINT Patient presents for Retina Follow Up   HISTORY OF PRESENT ILLNESS: Nicole Holt is a 79 y.o. female who presents to the clinic today for:   HPI    Retina Follow Up    Patient presents with  Other.  In right eye.  I, the attending physician,  performed the HPI with the patient and updated documentation appropriately.          Comments    3 day follow up Retained lens material OD- This morning eye was feeling irritated so she used her drops earlier which helped.   Travatan Z qhs OU, Ofloxacin QID, Prednisolone (using about 6x/d), Bromfenac QID, Cosopt TID, Brimonidine TID OD.  Diamox Seq po BID- she believes the Diamox is making her sleepy.        Last edited by Bernarda Caffey, MD on 09/09/2020  1:32 PM. (History)    Pt states she woke up this morning around 5:00 and her eye was stinging, she states she went ahead and used all her drops at that point, she states the diamox is making her tired  Referring physician: Vivi Barrack, MD [place],  [place] [id number]  HISTORICAL INFORMATION:   Selected notes from the MEDICAL RECORD NUMBER Referred by Dr. Venetia Maxon for retained lens material OD LEE:  Ocular Hx- PMH-    CURRENT MEDICATIONS: Current Outpatient Medications (Ophthalmic Drugs)  Medication Sig  . ofloxacin (OCUFLOX) 0.3 % ophthalmic solution   . prednisoLONE acetate (PRED FORTE) 1 % ophthalmic suspension   . Travoprost, BAK Free, (TRAVATAN) 0.004 % SOLN ophthalmic solution Place 1 drop into both eyes at bedtime.   No current facility-administered medications for this visit. (Ophthalmic Drugs)   Current Outpatient Medications (Other)  Medication Sig  . acetaZOLAMIDE (DIAMOX SEQUELS) 500 MG capsule Take 1 capsule (500 mg total) by mouth 2 (two) times daily.  . diazepam (VALIUM) 5 MG tablet Take 1 tablet (5 mg total) by mouth every 6 (six) hours as needed. for anxiety  .  fluticasone (FLONASE) 50 MCG/ACT nasal spray Use 2 spray(s) in each nostril once daily  . gabapentin (NEURONTIN) 100 MG capsule Take 1-3 capsules (100-300 mg total) by mouth 3 (three) times daily as needed (nerve pain).  . Melatonin 10 MG TABS Take by mouth.  . Multiple Vitamins-Minerals (CENTRUM ADULTS PO) Take by mouth.  . Omega-3 Fatty Acids (FISH OIL PO) Take by mouth.  . predniSONE (DELTASONE) 50 MG tablet Take 1 tablet (50 mg total) by mouth daily.  . Turmeric (QC TUMERIC COMPLEX PO) Take by mouth.   No current facility-administered medications for this visit. (Other)      REVIEW OF SYSTEMS: ROS    Positive for: Neurological, Musculoskeletal, HENT, Eyes, Respiratory   Negative for: Constitutional, Gastrointestinal, Skin, Genitourinary, Endocrine, Cardiovascular, Psychiatric, Allergic/Imm, Heme/Lymph   Last edited by Leonie Douglas, COA on 09/09/2020  8:09 AM. (History)       ALLERGIES Allergies  Allergen Reactions  . Codeine Nausea Only  . Estrogens Conjugated Other (See Comments)    HA  . Paroxetine Hcl Other (See Comments)    Felt like she was "out of her head"    PAST MEDICAL HISTORY Past Medical History:  Diagnosis Date  . Allergy   . Anxiety    panic attack  . Depression   . GERD (gastroesophageal reflux disease)   . Glaucoma   . Glaucoma   .  Hearing loss   . 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 Hyperlipidemia   . Hypertensive retinopathy    OU  . Migraines    ocular  . OSA on CPAP 02/02/2014   PSG 09-27-2012 RDI 18.9 and AHi 9.8, Epworth 15 .titrated to only 5 cm water.   . Osteoporosis   . Sleep apnea   . Tinnitus    Past Surgical History:  Procedure Laterality Date  . CATARACT EXTRACTION Bilateral OD: 10.25.21 OS: 12.2015   Dr. Venetia Maxon  . cataract surgery    . COSMETIC SURGERY    . EYE SURGERY Bilateral    Cat Sx - Dr. Venetia Maxon    FAMILY HISTORY Family History  Problem Relation Age of Onset  . Osteoporosis  Mother   . Stroke Mother   . Hyperlipidemia Mother   . Hypertension Mother   . Kidney disease Father   . Congestive Heart Failure Father   . Heart disease Father   . Hyperlipidemia Father   . Stroke Sister   . Parkinson's disease Sister   . Heart disease Sister   . Heart disease Maternal Grandmother   . Gout Maternal Grandmother   . Stroke Maternal Grandfather   . Stroke Paternal Grandmother   . Thrombosis Paternal Grandfather   . Early death Paternal Grandfather   . Hypertension Brother   . Diabetes Brother   . Arthritis/Rheumatoid Other   . Parkinson's disease Other   . Heart disease Other   . Obesity Daughter   . Breast cancer Neg Hx     SOCIAL HISTORY Social History   Tobacco Use  . Smoking status: Former Research scientist (life sciences)  . Smokeless tobacco: Never Used  . Tobacco comment: Quit in 1987  Vaping Use  . Vaping Use: Never used  Substance Use Topics  . Alcohol use: Yes    Alcohol/week: 12.0 standard drinks    Types: 12 Glasses of wine per week    Comment: wine or alcohol daily  . Drug use: No         OPHTHALMIC EXAM:  Base Eye Exam    Visual Acuity (Snellen - Linear)      Right Left   Dist [place] 20/100 +2 20/25   Dist ph [place] NI 20/25 +1       Tonometry (Tonopen, 8:16 AM)      Right Left   Pressure 35 15       Pupils      Dark Light Shape React APD   Right 3 2 Irregular Minimal None   Left 2 1 Round Minimal None       Visual Fields (Counting fingers)      Left Right    Full Full       Extraocular Movement      Right Left    Full Full       Neuro/Psych    Oriented x3: Yes   Mood/Affect: Normal       Dilation    Both eyes: 1.0% Mydriacyl, 2.5% Phenylephrine @ 8:17 AM        Slit Lamp and Fundus Exam    Slit Lamp Exam      Right Left   Lids/Lashes Dermatochalasis - upper lid Dermatochalasis - upper lid   Conjunctiva/Sclera trace Injection temporally White and quiet   Cornea Trace Punctate epithelial erosions, well healed temporal cataract  wounds with 10-0 nylon suture at 0900, arcus, trace micro cystic edema inferiorly, mild corneal haze 1+Punctate  epithelial erosions, well healed temporal cataract wounds   Anterior Chamber Deep, 3+ Cell, mild cortical remnants, iStent at 0200 Deep and quiet, Deep   Iris Round and dilated Round and dilated   Lens 3 piece IOL in good position, open PC, cortical remnants temporal half of capsule - slighlty improved PC IOL in good position with trace PCO non-central   Vitreous +Cell/debris; retained lens fragments -- mobile, settled inferiorly, disintegrating syneresis       Fundus Exam      Right Left   Disc hazy view, Pink and Sharp, +cupping, mild PPP Pink and Sharp, mild tilt, IT PPA   C/D Ratio 0.6 0.5   Macula hazy view, grossly flat, No heme or edema Flat, Blunted foveal reflex, mild RPE mottling, No heme or edema   Vessels hazy view, attenuated, Tortuous attenuated, Tortuous   Periphery hazy view, grossly attached, retained lens material mobile, settled inferiorly Attached             IMAGING AND PROCEDURES  Imaging and Procedures for 09/09/2020  OCT, Retina - OU - Both Eyes       Right Eye Quality was borderline. Central Foveal Thickness: 278. Progression has been stable. Findings include normal foveal contour, no IRF, no SRF (+vitreous opacities/debris).   Left Eye Quality was good. Central Foveal Thickness: 279. Progression has been stable. Findings include normal foveal contour, no IRF, no SRF.   Notes *Images captured and stored on drive  Diagnosis / Impression:  NFP, no IRF/SRF OU -OD: view slightly improved today  Clinical management:  See below  Abbreviations: NFP - Normal foveal profile. CME - cystoid macular edema. PED - pigment epithelial detachment. IRF - intraretinal fluid. SRF - subretinal fluid. EZ - ellipsoid zone. ERM - epiretinal membrane. ORA - outer retinal atrophy. ORT - outer retinal tubulation. SRHM - subretinal hyper-reflective material. IRHM -  intraretinal hyper-reflective material                ASSESSMENT/PLAN:    ICD-10-CM   1. Retained lens material following cataract surgery of right eye  H59.021   2. Retinal edema  H35.81 OCT, Retina - OU - Both Eyes  3. Essential hypertension  I10   4. Hypertensive retinopathy of both eyes  H35.033   5. Pseudophakia, both eyes  Z96.1     1. Retained lens material OD  - s/p CE, sulcus IOL on Monday 10.25.21  - broken posterior capsule, nuclear and cortical lens fragments in vitreous  - had IOP spike to 40 on 11.3.21  - improved post drops, AC tap and diamox by Dr. Venetia Maxon  - 3+ AC cell  - today, IOP back up to 35 with mild microcystic corneal edema  - IOP improved w/ AC tap today 11.8.21  - discussed findings, prognosis and possible need for surgery to remove lens fragments  - continue  ocuflox QID OD    PF Q2H OD    Bromsite QID OD    Travaprost QHS OU    Cosopt TID OD    Brimonidine TID OD             - continue po Diamox sequels 500mg  PO BID  - will plan for PPV w/ removal of retained lens material Thursday, 11.11.21 -- case posted  - f/u tomorrow IOP check, surgical paperwork  2. No retinal edema on exam or OCT  3,4. Hypertensive retinopathy OU - discussed importance of tight BP control - monitor  5. Pseudophakia OU  -  s/p CE/IOL (Dr. Venetia Maxon, OD: 10.25.21, OS: 12.2015)  - IOLs in good position  - retained lens material OD as above - monitor  Ophthalmic Meds Ordered this visit:  No orders of the defined types were placed in this encounter.      Return in about 1 day (around 09/10/2020) for retained lens material, IOP check, DFE, OCT.  There are no Patient Instructions on file for this visit.   Explained the diagnoses, plan, and follow up with the patient and they expressed understanding.  Patient expressed understanding of the importance of proper follow up care.   This document serves as a record of services personally performed by Gardiner Sleeper, MD, PhD. It was created on their behalf by San Jetty. Owens Shark, OA an ophthalmic technician. The creation of this record is the provider's dictation and/or activities during the visit.    Electronically signed by: San Jetty. Owens Shark, [place] 11.08.2021 1:35 PM  Gardiner Sleeper, M.D., Ph.D. Diseases & Surgery of the Retina and Vitreous [facility]  I have reviewed the above documentation for accuracy and completeness, and I agree with the above. Gardiner Sleeper, M.D., Ph.D. 09/09/20 1:35 PM   Abbreviations: M myopia (nearsighted); A astigmatism; H hyperopia (farsighted); P presbyopia; Mrx spectacle prescription;  CTL contact lenses; OD right eye; OS left eye; OU both eyes  XT exotropia; ET esotropia; PEK punctate epithelial keratitis; PEE punctate epithelial erosions; DES dry eye syndrome; MGD meibomian gland dysfunction; ATs artificial tears; PFAT's preservative free artificial tears; [place] nuclear sclerotic cataract; PSC posterior subcapsular cataract; ERM epi-retinal membrane; PVD posterior vitreous detachment; RD retinal detachment; DM diabetes mellitus; DR diabetic retinopathy; NPDR non-proliferative diabetic retinopathy; PDR proliferative diabetic retinopathy; CSME clinically significant macular edema; DME diabetic macular edema; dbh dot blot hemorrhages; CWS cotton wool spot; POAG primary open angle glaucoma; C/D cup-to-disc ratio; HVF humphrey visual field; GVF goldmann visual field; OCT optical coherence tomography; IOP intraocular pressure; BRVO Branch retinal vein occlusion; CRVO central retinal vein occlusion; CRAO central retinal artery occlusion; BRAO branch retinal artery occlusion; RT retinal tear; SB scleral buckle; PPV pars plana vitrectomy; VH Vitreous hemorrhage; PRP panretinal laser photocoagulation; IVK intravitreal kenalog; VMT vitreomacular traction; MH Macular hole;  NVD neovascularization of the disc; NVE neovascularization elsewhere; AREDS age related eye  disease study; ARMD age related macular degeneration; POAG primary open angle glaucoma; EBMD epithelial/anterior basement membrane dystrophy; ACIOL anterior chamber intraocular lens; IOL intraocular lens; PCIOL posterior chamber intraocular lens; Phaco/IOL phacoemulsification with intraocular lens placement; [place] photorefractive keratectomy; LASIK laser assisted in situ keratomileusis; HTN hypertension; DM diabetes mellitus; COPD chronic obstructive pulmonary disease

## 2020-09-10 ENCOUNTER — Other Ambulatory Visit (HOSPITAL_COMMUNITY): Payer: Medicare HMO

## 2020-09-10 ENCOUNTER — Ambulatory Visit (INDEPENDENT_AMBULATORY_CARE_PROVIDER_SITE_OTHER): Payer: Medicare HMO | Admitting: Ophthalmology

## 2020-09-10 ENCOUNTER — Ambulatory Visit (INDEPENDENT_AMBULATORY_CARE_PROVIDER_SITE_OTHER): Payer: Medicare HMO | Admitting: Family Medicine

## 2020-09-10 ENCOUNTER — Encounter (INDEPENDENT_AMBULATORY_CARE_PROVIDER_SITE_OTHER): Payer: Self-pay | Admitting: Ophthalmology

## 2020-09-10 ENCOUNTER — Encounter: Payer: Self-pay | Admitting: Family Medicine

## 2020-09-10 ENCOUNTER — Other Ambulatory Visit: Payer: Self-pay

## 2020-09-10 VITALS — BP 115/69 | HR 66 | Temp 97.8°F | Ht 62.0 in | Wt 171.0 lb

## 2020-09-10 DIAGNOSIS — H40051 Ocular hypertension, right eye: Secondary | ICD-10-CM | POA: Diagnosis not present

## 2020-09-10 DIAGNOSIS — Z01818 Encounter for other preprocedural examination: Secondary | ICD-10-CM

## 2020-09-10 DIAGNOSIS — H3581 Retinal edema: Secondary | ICD-10-CM | POA: Diagnosis not present

## 2020-09-10 DIAGNOSIS — H35033 Hypertensive retinopathy, bilateral: Secondary | ICD-10-CM

## 2020-09-10 DIAGNOSIS — H59021 Cataract (lens) fragments in eye following cataract surgery, right eye: Secondary | ICD-10-CM

## 2020-09-10 DIAGNOSIS — Z961 Presence of intraocular lens: Secondary | ICD-10-CM | POA: Diagnosis not present

## 2020-09-10 DIAGNOSIS — I10 Essential (primary) hypertension: Secondary | ICD-10-CM | POA: Diagnosis not present

## 2020-09-10 NOTE — Progress Notes (Signed)
[facility] Note  09/10/2020     CHIEF COMPLAINT Patient presents for Retina Follow Up   HISTORY OF PRESENT ILLNESS: Nicole Holt is a 79 y.o. female who presents to the clinic today for:   HPI    Retina Follow Up    Diagnosis: Retained lens material OD.  In right eye.  Severity is moderate.  Duration of 1 day.  I, the attending physician,  performed the HPI with the patient and updated documentation appropriately.          Comments    Patient states vision the same. No eye pain. Using gtts and diamox as instructed.        Last edited by Bernarda Caffey, MD on 09/10/2020  9:40 AM. (History)      Referring physician: Vivi Barrack, MD [place],  [place] [id number]  HISTORICAL INFORMATION:   Selected notes from the MEDICAL RECORD NUMBER Referred by Dr. Venetia Maxon for retained lens material OD LEE:  Ocular Hx- PMH-    CURRENT MEDICATIONS: Current Outpatient Medications (Ophthalmic Drugs)  Medication Sig  . brimonidine (ALPHAGAN) 0.15 % ophthalmic solution Place 1 drop into the right eye 3 (three) times daily.  . dorzolamide-timolol (COSOPT) 22.3-6.8 MG/ML ophthalmic solution Place 1 drop into the right eye in the morning, at noon, and at bedtime.  Marland Kitchen ofloxacin (OCUFLOX) 0.3 % ophthalmic solution Place 1 drop into the right eye 5 (five) times daily.   . prednisoLONE acetate (PRED FORTE) 1 % ophthalmic suspension Place 1 drop into the right eye 6 (six) times daily.   . Travoprost, BAK Free, (TRAVATAN) 0.004 % SOLN ophthalmic solution Place 1 drop into both eyes at bedtime.  . Bromfenac Sodium (PROLENSA) 0.07 % SOLN Place 1 drop into the right eye in the morning, at noon, in the evening, and at bedtime.   No current facility-administered medications for this visit. (Ophthalmic Drugs)   Current Outpatient Medications (Other)  Medication Sig  . acetaZOLAMIDE (DIAMOX SEQUELS) 500 MG capsule Take 1 capsule (500 mg total) by mouth 2 (two)  times daily.  . diazepam (VALIUM) 5 MG tablet Take 1 tablet (5 mg total) by mouth every 6 (six) hours as needed. for anxiety (Patient taking differently: Take 5 mg by mouth every 6 (six) hours as needed (panic attacks.). )  . fluticasone (FLONASE) 50 MCG/ACT nasal spray Use 2 spray(s) in each nostril once daily (Patient taking differently: Place 2 sprays into both nostrils daily as needed for allergies. )  . gabapentin (NEURONTIN) 100 MG capsule Take 1-3 capsules (100-300 mg total) by mouth 3 (three) times daily as needed (nerve pain).  . Omega-3 Fatty Acids (FISH OIL PO) Take 1 capsule by mouth every 14 (fourteen) days.   . predniSONE (DELTASONE) 50 MG tablet Take 1 tablet (50 mg total) by mouth daily. (Patient not taking: Reported on 09/11/2020)  . Turmeric (QC TUMERIC COMPLEX PO) Take 1 capsule by mouth daily.   . calcium carbonate (TUMS - DOSED IN MG ELEMENTAL CALCIUM) 500 MG chewable tablet Chew 1-2 tablets by mouth 3 (three) times daily as needed for indigestion or heartburn.  . Calcium Carbonate-Vit D-Min (QC CALCIUM-MAGNESIUM-ZINC-D3 PO) Take 1 tablet by mouth once a week.  . melatonin 3 MG TABS tablet Take 3 mg by mouth at bedtime as needed (sleep).  . polyethylene glycol (MIRALAX / GLYCOLAX) 17 g packet Take 17 g by mouth daily.  . Potassium 99 MG TABS Take 99 mg by  mouth 2 (two) times a week.   No current facility-administered medications for this visit. (Other)      REVIEW OF SYSTEMS: ROS    Positive for: Neurological, Musculoskeletal, HENT, Eyes, Respiratory   Negative for: Constitutional, Gastrointestinal, Skin, Genitourinary, Endocrine, Cardiovascular, Psychiatric, Allergic/Imm, Heme/Lymph   Last edited by Roselee Nova D, COT on 09/10/2020  9:05 AM. (History)       ALLERGIES Allergies  Allergen Reactions  . Codeine Nausea Only  . Estrogens Conjugated Other (See Comments)    HA  . Paroxetine Hcl Other (See Comments)    Felt like she was "out of her head"    PAST  MEDICAL HISTORY Past Medical History:  Diagnosis Date  . Allergy   . Anxiety    panic attack  . Depression   . GERD (gastroesophageal reflux disease)   . Glaucoma   . Glaucoma   . Hearing loss   . 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 Hyperlipidemia   . Hypertensive retinopathy    OU  . Migraines    ocular  . OSA on CPAP 02/02/2014   PSG 09-27-2012 RDI 18.9 and AHi 9.8, Epworth 15 .titrated to only 5 cm water.   . Osteoporosis   . Sleep apnea   . Tinnitus    Past Surgical History:  Procedure Laterality Date  . AIR/FLUID EXCHANGE Right 09/12/2020   Procedure: AIR/FLUID EXCHANGE;  Surgeon: Bernarda Caffey, MD;  Location: [facility];  Service: Ophthalmology;  Laterality: Right;  . CATARACT EXTRACTION Bilateral OD: 10.25.21 OS: 12.2015   Dr. Venetia Maxon  . cataract surgery    . COSMETIC SURGERY    . EYE SURGERY Bilateral    Cat Sx - Dr. Venetia Maxon  . PARS PLANA VITRECTOMY Right 09/12/2020   Procedure: PARS PLANA VITRECTOMY WITH 23 GAUGE;  Surgeon: Bernarda Caffey, MD;  Location: [facility];  Service: Ophthalmology;  Laterality: Right;  . PHOTOCOAGULATION WITH LASER Right 09/12/2020   Procedure: PHOTOCOAGULATION WITH LASER;  Surgeon: Bernarda Caffey, MD;  Location: [facility];  Service: Ophthalmology;  Laterality: Right;  . REMOVAL RETAINED LENS Right 09/12/2020   Procedure: REMOVAL RETAINED LENS;  Surgeon: Bernarda Caffey, MD;  Location: [facility];  Service: Ophthalmology;  Laterality: Right;    FAMILY HISTORY Family History  Problem Relation Age of Onset  . Osteoporosis Mother   . Stroke Mother   . Hyperlipidemia Mother   . Hypertension Mother   . Kidney disease Father   . Congestive Heart Failure Father   . Heart disease Father   . Hyperlipidemia Father   . Stroke Sister   . Parkinson's disease Sister   . Heart disease Sister   . Heart disease Maternal Grandmother   . Gout Maternal Grandmother   . Stroke Maternal Grandfather   . Stroke Paternal Grandmother   .  Thrombosis Paternal Grandfather   . Early death Paternal Grandfather   . Hypertension Brother   . Diabetes Brother   . Arthritis/Rheumatoid Other   . Parkinson's disease Other   . Heart disease Other   . Obesity Daughter   . Breast cancer Neg Hx     SOCIAL HISTORY Social History   Tobacco Use  . Smoking status: Former Research scientist (life sciences)  . Smokeless tobacco: Never Used  . Tobacco comment: Quit in 1987  Vaping Use  . Vaping Use: Never used  Substance Use Topics  . Alcohol use: Yes    Alcohol/week: 12.0 standard drinks    Types: 12 Glasses of  wine per week    Comment: wine or alcohol daily  . Drug use: No         OPHTHALMIC EXAM:  Base Eye Exam    Visual Acuity (Snellen - Linear)      Right Left   Dist [place] 20/40 20/30   Dist ph [place] NI 20/20       Tonometry (Tonopen, 9:11 AM)      Right Left   Pressure 30 14       Tonometry #2 (Tonopen, 9:57 AM)      Right Left   Pressure 31        Pupils      Dark Light Shape React APD   Right 3 3 Round None None   Left 2 1 Round Minimal None       Visual Fields (Counting fingers)      Left Right    Full Full       Extraocular Movement      Right Left    Full, Ortho Full, Ortho       Neuro/Psych    Oriented x3: Yes   Mood/Affect: Normal       Dilation    Both eyes: 1.0% Mydriacyl, 2.5% Phenylephrine @ 9:11 AM        Slit Lamp and Fundus Exam    Slit Lamp Exam      Right Left   Lids/Lashes Dermatochalasis - upper lid Dermatochalasis - upper lid   Conjunctiva/Sclera 1+Injection temporally White and quiet   Cornea Trace Punctate epithelial erosions, trace tear film debris, well healed temporal cataract wounds with 10-0 nylon suture at 0900, arcus, trace micro cystic edema inferiorly - resolved 1+Punctate epithelial erosions, well healed temporal cataract wounds   Anterior Chamber Deep, 2-3+ Cell, mild cortical remnants - disintegrating, iStent at 0200 Deep and quiet, Deep   Iris Round and dilated Round and dilated    Lens 3 piece IOL in good position, open PC, cortical remnants temporal half of capsule - slighlty improved PC IOL in good position with trace PCO non-central   Vitreous +Cell/debris; retained lens fragments -- mobile, settled inferiorly, disintegrating syneresis       Fundus Exam      Right Left   Disc Pink and Sharp, mild tilt, +cupping, mild PPP Pink and Sharp, mild tilt, IT PPA   C/D Ratio 0.6 0.5   Macula Flat, Blunted foveal reflex, No heme or edema Flat, Blunted foveal reflex, mild RPE mottling, No heme or edema   Vessels attenuated, Tortuous attenuated, Tortuous   Periphery attached, retained lens material mobile, settled inferiorly -- slowly disintegrating  Attached             IMAGING AND PROCEDURES  Imaging and Procedures for 09/10/2020  Comprehensive metabolic panel     Component Value Flag Ref Range Units Status   Glucose, Bld 87      65 - 99 mg/dL Final   Comment:   .            Fasting reference interval .    BUN 16      7 - 25 mg/dL Final   Creat 1.08      0.60 - 0.93 mg/dL Final   Comment:   For patients >40 years of age, the reference limit for Creatinine is approximately 13% higher for people identified as African-American. .    BUN/Creatinine Ratio 15      6 - 22 (calc) Final   Sodium 137  135 - 146 mmol/L Final   Potassium 3.8      3.5 - 5.3 mmol/L Final   Chloride 108      98 - 110 mmol/L Final   CO2 20      20 - 32 mmol/L Final   Calcium 9.6      8.6 - 10.4 mg/dL Final   Total Protein 7.2      6.1 - 8.1 g/dL Final   Albumin 4.4      3.6 - 5.1 g/dL Final   Globulin 2.8      1.9 - 3.7 g/dL (calc) Final   AG Ratio 1.6      1.0 - 2.5 (calc) Final   Total Bilirubin 0.5      0.2 - 1.2 mg/dL Final   Alkaline phosphatase (APISO) 73      37 - 153 U/L Final   AST 21      10 - 35 U/L Final   ALT 20      6 - 29 U/L Final        CBC     Component Value Flag Ref Range Units Status   WBC 9.1      3.8 - 10.8 Thousand/uL Final   RBC 4.98      3.80 - 5.10  Million/uL Final   Hemoglobin 14.8      11.7 - 15.5 g/dL Final   HCT 46.0      35 - 45 % Final   MCV 92.4      80.0 - 100.0 fL Final   MCH 29.7      27.0 - 33.0 pg Final   MCHC 32.2      32.0 - 36.0 g/dL Final   RDW 13.3      11.0 - 15.0 % Final   Platelets 308      140 - 400 Thousand/uL Final   MPV 9.8      7.5 - 12.5 fL Final        Protime-INR     Component Value Flag Ref Range Units Status   INR 1.0        Final   Comment:   Reference Range                     0.9-1.1 Moderate-intensity Warfarin Therapy 2.0-3.0 Higher-intensity Warfarin Therapy   3.0-4.0  .    Prothrombin Time 10.5      9.0 - 11.5 sec Final   Comment:   For additional information, please refer to http://education.questdiagnostics.com/faq/FAQ104 (This link is being provided for informational/ educational purposes only.)         OCT, Retina - OU - Both Eyes       Right Eye Quality was good. Central Foveal Thickness: 286. Progression has improved. Findings include normal foveal contour, no IRF, no SRF (+vitreous opacities/debris; improved image quality).   Left Eye Quality was good. Central Foveal Thickness: 284. Progression has been stable. Findings include normal foveal contour, no IRF, no SRF.   Notes *Images captured and stored on drive  Diagnosis / Impression:  NFP, no IRF/SRF OU -OD: view slightly improved today  Clinical management:  See below  Abbreviations: NFP - Normal foveal profile. CME - cystoid macular edema. PED - pigment epithelial detachment. IRF - intraretinal fluid. SRF - subretinal fluid. EZ - ellipsoid zone. ERM - epiretinal membrane. ORA - outer retinal atrophy. ORT - outer retinal tubulation. SRHM - subretinal hyper-reflective material. IRHM - intraretinal hyper-reflective  material                ASSESSMENT/PLAN:    ICD-10-CM   1. Retained lens material following cataract surgery of right eye  H59.021   2. Retinal edema  H35.81 OCT, Retina - OU - Both Eyes  3.  Essential hypertension  I10   4. Hypertensive retinopathy of both eyes  H35.033   5. 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 Retained lens material OD  - s/p CE, sulcus IOL on Monday 10.25.21  - broken posterior capsule, nuclear and cortical lens fragments in vitreous  - had IOP spike to 40 on 11.3.21  - improved post drops, AC tap and diamox by Dr. Venetia Maxon  - 3+ AC cell  - today, IOP up to 31 with mild microcystic corneal edema slightly improved  - IOP improved w/ AC tap today   - recommend surgery to remove lens fragments -- 23g PPV w/ lensectomy OD under general anesthesia  - continue  ocuflox QID OD    PF Q2H OD    Bromsite QID OD    Travaprost QHS OU    Cosopt TID OD    Brimonidine TID OD             - continue po Diamox sequels 500mg  PO BID  - will plan for PPV w/ removal of retained lens material Thursday, 11.11.21 -- case posted  - f/u Friday, POV  2. No retinal edema on exam or OCT  3,4. Hypertensive retinopathy OU - discussed importance of tight BP control - monitor  5. Pseudophakia OU  - s/p CE/IOL (Dr. Venetia Maxon, OD: 10.25.21, OS: 12.2015)  - IOLs in good position  - retained lens material OD as above - monitor  Ophthalmic Meds Ordered this visit:  No orders of the defined types were placed in this encounter.      Return in about 3 days (around 09/13/2020) for retianed lens material OD, DFE, POV.  There are no Patient Instructions on file for this visit.   Explained the diagnoses, plan, and follow up with the patient and they expressed understanding.  Patient expressed understanding of the importance of proper follow up care.   This document serves as a record of services personally performed by Gardiner Sleeper, MD, PhD. It was created on their behalf by San Jetty. Owens Shark, OA an ophthalmic technician. The creation of this record is the provider's dictation and/or activities during the visit.    Electronically  signed by: San Jetty. Owens Shark, [place] 11.09.2021 1:26 AM  Gardiner Sleeper, M.D., Ph.D. Diseases & Surgery of the Retina and Vitreous [facility]  I have reviewed the above documentation for accuracy and completeness, and I agree with the above. Gardiner Sleeper, M.D., Ph.D. 09/15/20 1:26 AM   Abbreviations: M myopia (nearsighted); A astigmatism; H hyperopia (farsighted); P presbyopia; Mrx spectacle prescription;  CTL contact lenses; OD right eye; OS left eye; OU both eyes  XT exotropia; ET esotropia; PEK punctate epithelial keratitis; PEE punctate epithelial erosions; DES dry eye syndrome; MGD meibomian gland dysfunction; ATs artificial tears; PFAT's preservative free artificial tears; [place] nuclear sclerotic cataract; PSC posterior subcapsular cataract; ERM epi-retinal membrane; PVD posterior vitreous detachment; RD retinal detachment; DM diabetes mellitus; DR diabetic retinopathy; NPDR non-proliferative diabetic retinopathy; PDR proliferative diabetic retinopathy; CSME clinically significant macular edema; DME diabetic macular edema; dbh dot blot hemorrhages; CWS cotton wool spot;  POAG primary open angle glaucoma; C/D cup-to-disc ratio; HVF humphrey visual field; GVF goldmann visual field; OCT optical coherence tomography; IOP intraocular pressure; BRVO Branch retinal vein occlusion; CRVO central retinal vein occlusion; CRAO central retinal artery occlusion; BRAO branch retinal artery occlusion; RT retinal tear; SB scleral buckle; PPV pars plana vitrectomy; VH Vitreous hemorrhage; PRP panretinal laser photocoagulation; IVK intravitreal kenalog; VMT vitreomacular traction; MH Macular hole;  NVD neovascularization of the disc; NVE neovascularization elsewhere; AREDS age related eye disease study; ARMD age related macular degeneration; POAG primary open angle glaucoma; EBMD epithelial/anterior basement membrane dystrophy; ACIOL anterior chamber intraocular lens; IOL intraocular lens; PCIOL  posterior chamber intraocular lens; Phaco/IOL phacoemulsification with intraocular lens placement; [place] photorefractive keratectomy; LASIK laser assisted in situ keratomileusis; HTN hypertension; DM diabetes mellitus; COPD chronic obstructive pulmonary disease

## 2020-09-10 NOTE — H&P (Signed)
Nicole Holt is an 79 y.o. female.    Chief Complaint: retained lens material, RIGHT EYE  HPI: Pt with history of pseudophakia OD s/p CE, sulcus IOL OD w/ Dr. Venetia Maxon on 17.40.81, complicated by posterior capsule rupture and retained lens material in vitreous. The retained lens material caused inflammation, increases in IOP and blurred vision. After a discussion of the risks benefits and alternatives to surgery, the patient has elected to proceed with surgery to remove the retained lens fragments from the vitreous -- 23g PPV w/ lensectomy OD under general anesthesia.   Past Medical History:  Diagnosis Date  . Allergy   . Anxiety    panic attack  . Depression   . GERD (gastroesophageal reflux disease)   . Glaucoma   . Glaucoma   . Hearing loss   . 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 Hyperlipidemia   . Hypertensive retinopathy    OU  . Migraines    ocular  . OSA on CPAP 02/02/2014   PSG 09-27-2012 RDI 18.9 and AHi 9.8, Epworth 15 .titrated to only 5 cm water.   . Osteoporosis   . Sleep apnea   . Tinnitus     Past Surgical History:  Procedure Laterality Date  . CATARACT EXTRACTION Bilateral OD: 10.25.21 OS: 12.2015   Dr. Venetia Maxon  . cataract surgery    . COSMETIC SURGERY    . EYE SURGERY Bilateral    Cat Sx - Dr. Venetia Maxon    Family History  Problem Relation Age of Onset  . Osteoporosis Mother   . Stroke Mother   . Hyperlipidemia Mother   . Hypertension Mother   . Kidney disease Father   . Congestive Heart Failure Father   . Heart disease Father   . Hyperlipidemia Father   . Stroke Sister   . Parkinson's disease Sister   . Heart disease Sister   . Heart disease Maternal Grandmother   . Gout Maternal Grandmother   . Stroke Maternal Grandfather   . Stroke Paternal Grandmother   . Thrombosis Paternal Grandfather   . Early death Paternal Grandfather   . Hypertension Brother   . Diabetes Brother   . Arthritis/Rheumatoid Other   .  Parkinson's disease Other   . Heart disease Other   . Obesity Daughter   . Breast cancer Neg Hx    Social History:  reports that she has quit smoking. She has never used smokeless tobacco. She reports current alcohol use of about 12.0 standard drinks of alcohol per week. She reports that she does not use drugs.  Allergies:  Allergies  Allergen Reactions  . Codeine Nausea Only  . Estrogens Conjugated Other (See Comments)    HA  . Paroxetine Hcl Other (See Comments)    Felt like she was "out of her head"    No medications prior to admission.    Review of systems otherwise negative  There were no vitals taken for this visit.  Physical exam: Mental status: oriented x3. Eyes: See eye exam associated with this date of surgery Ears, Nose, Throat: within normal limits Neck: Within Normal limits General: within normal limits Chest: Within normal limits Breast: deferred Heart: Within normal limits Abdomen: Within normal limits GU: deferred Extremities: within normal limits Skin: within normal limits  Assessment/Plan 1. Retained lens material, RIGHT EYE  Plan: To [facility] for 23 gauge pars plana vitrectomy with lensectomy OD under general anesthesia - case scheduled for Thursday, 11.11.21 -- [facility]  OR 08  Gardiner Sleeper, M.D., Ph.D. Vitreoretinal Surgeon [facility]

## 2020-09-10 NOTE — Patient Instructions (Addendum)
It was very nice to see you today!  You are clear for surgery depending on your blood work.   Take care, Dr Jerline Pain  Please try these tips to maintain a healthy lifestyle:   Eat at least 3 REAL meals and 1-2 snacks per day.  Aim for no more than 5 hours between eating.  If you eat breakfast, please do so within one hour of getting up.    Each meal should contain half fruits/vegetables, one quarter protein, and one quarter carbs (no bigger than a computer mouse)   Cut down on sweet beverages. This includes juice, soda, and sweet tea.     Drink at least 1 glass of water with each meal and aim for at least 8 glasses per day   Exercise at least 150 minutes every week.

## 2020-09-10 NOTE — Progress Notes (Signed)
Chief Complaint:  Nicole Holt is a 79 y.o. female who presents today for consultation for surgical clearance at the request of Dr. Coralyn Pear.  Assessment/Plan:  Encounter For Surgical Clearance Patient overall low perioperative cardiovascular risk.  Has excellent functional status.  EKG today normal.  Will check CBC, CMET, and PT/INR.  Assuming these results are within normal limits she will be cleared for surgery.  Retained Lens Material She will continue ophthalmic drops per ophthalmology.  Will be undergoing surgery as above in 2 days.     Subjective:  HPI:  Patient here today for consultation for surgical clearance.  Will be undergoing ophthalmic retinal surgery on 09/12/2020 for retained lens material.  Underwent cataract extraction on 08/26/2020.  Did fine initially poster operatively but then developed increased ocular pressure and severe pain. Then was referred to retinal specialist. Had postoperative increased ocular pressure.  Found to have retained lens material.  Will be undergoing general anesthesia and need surgical clearance. She has been taking eye drops as prescribed. Symptoms have been stable. No nausea or vomiting. She has some decreased vision in her right eye but this is stable.   She has excellent functional status. She is able to climb a flight of stairs without chest pain or shortness of breath. Has no cardiac history.   ROS: Per HPI, otherwise a complete review of systems was negative.   PMH:  The following were reviewed and entered/updated in epic: Past Medical History:  Diagnosis Date   Allergy    Anxiety    panic attack   Depression    GERD (gastroesophageal reflux disease)    Glaucoma    Glaucoma    Hearing loss    History of basal cell cancer 08/19/2012   Removed from nose   Hyperlipidemia    Hypertensive retinopathy    OU   Migraines    ocular   OSA on CPAP 02/02/2014   PSG 09-27-2012 RDI 18.9 and AHi 9.8, Epworth 15 .titrated to  only 5 cm water.    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 Venetia Maxon   cataract surgery     COSMETIC SURGERY     EYE SURGERY Bilateral    Cat Sx - Dr. Venetia Maxon    Family History  Problem Relation Age of Onset   Osteoporosis Mother    Stroke Mother    Hyperlipidemia Mother    Hypertension Mother    Kidney disease Father    Congestive Heart Failure Father    Heart disease Father    Hyperlipidemia Father    Stroke Sister  Parkinson's disease Sister    Heart disease Sister    Heart disease Maternal Grandmother    Gout Maternal Grandmother    Stroke Maternal Grandfather    Stroke Paternal Grandmother    Thrombosis Paternal Grandfather    Early death Paternal Grandfather    Hypertension Brother    Diabetes Brother    Arthritis/Rheumatoid Other    Parkinson's disease Other    Heart disease Other    Obesity Daughter    Breast cancer Neg Hx     Medications- reviewed and updated Current Outpatient Medications  Medication Sig  Dispense Refill   acetaZOLAMIDE (DIAMOX SEQUELS) 500 MG capsule Take 1 capsule (500 mg total) by mouth 2 (two) times daily. 60 capsule 0   brimonidine (ALPHAGAN) 0.15 % ophthalmic solution Place 1 drop into the right eye 3 (three) times daily.     diazepam (VALIUM) 5 MG tablet Take 1 tablet (5 mg total) by mouth every 6 (six) hours as needed. for anxiety 30 tablet 0   dorzolamide-timolol (COSOPT) 22.3-6.8 MG/ML ophthalmic solution Place 1 drop into the right eye in the morning, at noon, and at bedtime.     fluticasone (FLONASE) 50 MCG/ACT nasal spray Use 2 spray(s) in each nostril once daily 16 g 0   gabapentin (NEURONTIN) 100 MG capsule Take 1-3 capsules (100-300 mg total) by mouth 3 (three) times daily as needed (nerve pain). 90 capsule 1   Melatonin 10 MG TABS Take by mouth.     Multiple Vitamins-Minerals (CENTRUM ADULTS PO) Take by mouth.     Omega-3 Fatty Acids (FISH OIL PO) Take by mouth.     prednisoLONE acetate (PRED FORTE) 1 % ophthalmic suspension Place 1 drop into the right eye every 2 (two) hours while awake.      ofloxacin (OCUFLOX) 0.3 % ophthalmic solution Place 1 drop into the right eye 4 (four) times daily.      predniSONE (DELTASONE) 50 MG tablet Take 1 tablet (50 mg total) by mouth daily. 5 tablet 0   Travoprost, BAK Free, (TRAVATAN) 0.004 % SOLN ophthalmic solution Place 1 drop into both eyes at bedtime.     Turmeric (QC TUMERIC COMPLEX PO) Take by mouth. (Patient not taking: Reported on 09/10/2020)     No current facility-administered medications for this visit.    Allergies-reviewed and updated Allergies  Allergen Reactions   Codeine Nausea Only   Estrogens Conjugated Other (See Comments)    HA   Paroxetine Hcl Other (See Comments)    Felt like she was "out of her head"    Social History   Socioeconomic History   Marital status: Married    Spouse name: Legrand Como   Number of children: 2   Years of education: 16   Highest education level: Some  college, no degree  Occupational History   Occupation: Retired  Tobacco Use   Smoking status: Former Smoker   Smokeless tobacco: Never Used   Tobacco comment: Quit in [place] Use: Never used  Substance and Sexual Activity   Alcohol use: Yes    Alcohol/week: 12.0 standard drinks    Types: 12 Glasses of wine per week    Comment: wine or alcohol daily   Drug use: No   Sexual activity: Yes  Other Topics Concern   Not on file  Social History Narrative   Patient is married Legrand Como) and lives with her husband.   Patient is retired.   Patient has two children.  Patient is right-handed.   Patient has a college education.   Patient drinks two cups of coffee daily.            Social Determinants of Health   Financial Resource Strain:    Difficulty of Paying Living Expenses: Not on file  Food Insecurity: No Food Insecurity   Worried About Charity fundraiser in the Last Year: Never true   Ran Out of Food in the Last Year: Never true  Transportation Needs: No Transportation Needs   Lack of Transportation (Medical): No   Lack of Transportation (Non-Medical): No  Physical Activity:    Days of Exercise per Week: Not on file   Minutes of Exercise per Session: Not on file  Stress:    Feeling of Stress : Not on file  Social Connections:    Frequency of Communication with Friends and Family: Not on file   Frequency of Social Gatherings with Friends and Family: Not on file   Attends Religious Services: Not on file   Active Member of Clubs or Organizations: Not on file   Attends Archivist Meetings: Not on file   Marital Status: Not on file         Objective:  Physical Exam: BP 115/69    Pulse 66    Temp 97.8 F (36.6 C) (Temporal)    Ht 5\' 2"  (1.575 m)    Wt 171 lb (77.6 kg)    SpO2 99%    BMI 31.28 kg/m   Gen: NAD, resting comfortably CV: Regular rate and rhythm with no murmurs appreciated Pulm: Normal work of breathing,  clear to auscultation bilaterally with no crackles, wheezes, or rhonchi GI: Normal bowel sounds present. Soft, Nontender, Nondistended. MSK: No edema, cyanosis, or clubbing noted Skin: Warm, dry Neuro: Grossly normal, moves all extremities Psych: Normal affect and thought content  EKG: NSR, no ischemic changes      A copy of this note will be forwarded to the requesting physician.   Algis Greenhouse. Jerline Pain, MD 09/10/2020 1:43 PM

## 2020-09-11 ENCOUNTER — Encounter (HOSPITAL_COMMUNITY): Payer: Self-pay | Admitting: Ophthalmology

## 2020-09-11 ENCOUNTER — Other Ambulatory Visit: Payer: Self-pay

## 2020-09-11 LAB — CBC
HCT: 46 % — ABNORMAL HIGH (ref 35.0–45.0)
Hemoglobin: 14.8 g/dL (ref 11.7–15.5)
MCH: 29.7 pg (ref 27.0–33.0)
MCHC: 32.2 g/dL (ref 32.0–36.0)
MCV: 92.4 fL (ref 80.0–100.0)
MPV: 9.8 fL (ref 7.5–12.5)
Platelets: 308 10*3/uL (ref 140–400)
RBC: 4.98 10*6/uL (ref 3.80–5.10)
RDW: 13.3 % (ref 11.0–15.0)
WBC: 9.1 10*3/uL (ref 3.8–10.8)

## 2020-09-11 LAB — COMPREHENSIVE METABOLIC PANEL
AG Ratio: 1.6 (calc) (ref 1.0–2.5)
ALT: 20 U/L (ref 6–29)
AST: 21 U/L (ref 10–35)
Albumin: 4.4 g/dL (ref 3.6–5.1)
Alkaline phosphatase (APISO): 73 U/L (ref 37–153)
BUN/Creatinine Ratio: 15 (calc) (ref 6–22)
BUN: 16 mg/dL (ref 7–25)
CO2: 20 mmol/L (ref 20–32)
Calcium: 9.6 mg/dL (ref 8.6–10.4)
Chloride: 108 mmol/L (ref 98–110)
Creat: 1.08 mg/dL — ABNORMAL HIGH (ref 0.60–0.93)
Globulin: 2.8 g/dL (calc) (ref 1.9–3.7)
Glucose, Bld: 87 mg/dL (ref 65–99)
Potassium: 3.8 mmol/L (ref 3.5–5.3)
Sodium: 137 mmol/L (ref 135–146)
Total Bilirubin: 0.5 mg/dL (ref 0.2–1.2)
Total Protein: 7.2 g/dL (ref 6.1–8.1)

## 2020-09-11 LAB — PROTIME-INR
INR: 1
Prothrombin Time: 10.5 s (ref 9.0–11.5)

## 2020-09-11 NOTE — Progress Notes (Signed)
Please inform patient of the following:  Labs all stable. She is clear for surgery.  Algis Greenhouse. Jerline Pain, MD 09/11/2020 9:18 AM

## 2020-09-11 NOTE — Progress Notes (Signed)
Patient denies shortness of breath, fever, cough or chest pain.  PCP - Dr Dimas Chyle Cardiologist - n/a  Chest x-ray - n/a EKG - 09/10/20 Stress Test - n/a ECHO - n/a Cardiac Cath - n/a  CPAP - Yes, uses cpap nightly  ERAS: Clears til 11:15 am DOS, no drink.  STOP now taking any Aspirin (unless otherwise instructed by your surgeon), Aleve, Naproxen, Ibuprofen, Motrin, Advil, Goody's, BC's, all herbal medications, fish oil, and all vitamins.   Coronavirus Screening Covid test on 09/09/20 was negative.  Patient verbalized understanding of instructions that were given via phone.

## 2020-09-12 ENCOUNTER — Encounter (HOSPITAL_COMMUNITY): Payer: Self-pay | Admitting: Ophthalmology

## 2020-09-12 ENCOUNTER — Encounter (HOSPITAL_COMMUNITY)
Admission: RE | Disposition: A | Discharge status: Home / Self Care | Payer: Self-pay | Source: Home / Self Care | Attending: Ophthalmology

## 2020-09-12 ENCOUNTER — Ambulatory Visit (HOSPITAL_COMMUNITY): Payer: Medicare HMO

## 2020-09-12 ENCOUNTER — Other Ambulatory Visit: Payer: Self-pay

## 2020-09-12 ENCOUNTER — Ambulatory Visit (HOSPITAL_COMMUNITY)
Admission: RE | Admit: 2020-09-12 | Discharge: 2020-09-12 | Disposition: A | Discharge status: Home / Self Care | Payer: Medicare HMO | Attending: Ophthalmology | Admitting: Ophthalmology

## 2020-09-12 DIAGNOSIS — Z87891 Personal history of nicotine dependence: Secondary | ICD-10-CM | POA: Diagnosis not present

## 2020-09-12 DIAGNOSIS — Z9841 Cataract extraction status, right eye: Secondary | ICD-10-CM | POA: Diagnosis not present

## 2020-09-12 DIAGNOSIS — Z885 Allergy status to narcotic agent status: Secondary | ICD-10-CM | POA: Insufficient documentation

## 2020-09-12 DIAGNOSIS — H59021 Cataract (lens) fragments in eye following cataract surgery, right eye: Secondary | ICD-10-CM | POA: Insufficient documentation

## 2020-09-12 DIAGNOSIS — F418 Other specified anxiety disorders: Secondary | ICD-10-CM | POA: Diagnosis not present

## 2020-09-12 DIAGNOSIS — G250 Essential tremor: Secondary | ICD-10-CM | POA: Diagnosis not present

## 2020-09-12 DIAGNOSIS — G4733 Obstructive sleep apnea (adult) (pediatric): Secondary | ICD-10-CM | POA: Diagnosis not present

## 2020-09-12 DIAGNOSIS — E785 Hyperlipidemia, unspecified: Secondary | ICD-10-CM | POA: Diagnosis not present

## 2020-09-12 HISTORY — PX: REMOVAL RETAINED LENS: SHX6464

## 2020-09-12 HISTORY — PX: EYE SURGERY: SHX253

## 2020-09-12 HISTORY — PX: AIR/FLUID EXCHANGE: SHX6494

## 2020-09-12 HISTORY — PX: PHOTOCOAGULATION WITH LASER: SHX6027

## 2020-09-12 HISTORY — PX: PARS PLANA VITRECTOMY: SHX2166

## 2020-09-12 SURGERY — PARS PLANA VITRECTOMY WITH 23 GAUGE
Anesthesia: General | Site: Eye | Laterality: Right

## 2020-09-12 MED ORDER — ONDANSETRON HCL 4 MG/2ML IJ SOLN
INTRAMUSCULAR | Status: AC
  Filled 2020-09-12: qty 2

## 2020-09-12 MED ORDER — STERILE WATER FOR INJECTION IJ SOLN
INTRAMUSCULAR | Status: DC | PRN
  Administered 2020-09-12: 20 mL

## 2020-09-12 MED ORDER — DEXAMETHASONE SODIUM PHOSPHATE 10 MG/ML IJ SOLN
INTRAMUSCULAR | Status: DC | PRN
  Administered 2020-09-12: 10 mg via INTRAVENOUS

## 2020-09-12 MED ORDER — SODIUM CHLORIDE (PF) 0.9 % IJ SOLN
INTRAMUSCULAR | Status: AC
  Filled 2020-09-12: qty 10

## 2020-09-12 MED ORDER — ROCURONIUM BROMIDE 10 MG/ML (PF) SYRINGE
PREFILLED_SYRINGE | INTRAVENOUS | Status: DC | PRN
  Administered 2020-09-12: 60 mg via INTRAVENOUS
  Administered 2020-09-12: 20 mg via INTRAVENOUS

## 2020-09-12 MED ORDER — FENTANYL CITRATE (PF) 100 MCG/2ML IJ SOLN
25.0000 ug | INTRAMUSCULAR | Status: DC | PRN
  Administered 2020-09-12: 25 ug via INTRAVENOUS

## 2020-09-12 MED ORDER — STERILE WATER FOR INJECTION IJ SOLN
INTRAMUSCULAR | Status: AC
  Filled 2020-09-12: qty 10

## 2020-09-12 MED ORDER — PROPARACAINE HCL 0.5 % OP SOLN
OPHTHALMIC | Status: AC
  Administered 2020-09-12: 1 [drp] via OPHTHALMIC
  Filled 2020-09-12: qty 15

## 2020-09-12 MED ORDER — SUGAMMADEX SODIUM 200 MG/2ML IV SOLN: INTRAVENOUS | Status: DC | PRN

## 2020-09-12 MED ORDER — GATIFLOXACIN 0.5 % OP SOLN OPTIME - NO CHARGE
OPHTHALMIC | Status: DC | PRN
  Administered 2020-09-12: 1 [drp] via OPHTHALMIC

## 2020-09-12 MED ORDER — ATROPINE SULFATE 1 % OP SOLN
OPHTHALMIC | Status: AC
  Administered 2020-09-12: 1 [drp] via OPHTHALMIC
  Filled 2020-09-12: qty 5

## 2020-09-12 MED ORDER — BSS PLUS IO SOLN
INTRAOCULAR | Status: DC | PRN
  Administered 2020-09-12: 1 via INTRAOCULAR

## 2020-09-12 MED ORDER — PHENYLEPHRINE HCL-NACL 10-0.9 MG/250ML-% IV SOLN
INTRAVENOUS | Status: DC | PRN
  Administered 2020-09-12: 20 ug/min via INTRAVENOUS

## 2020-09-12 MED ORDER — BRIMONIDINE TARTRATE 0.2 % OP SOLN
OPHTHALMIC | Status: AC
  Filled 2020-09-12: qty 5

## 2020-09-12 MED ORDER — MIDAZOLAM HCL 2 MG/2ML IJ SOLN: 0.5000 mg | Freq: Once | INTRAMUSCULAR | Status: DC | PRN

## 2020-09-12 MED ORDER — ORAL CARE MOUTH RINSE: 15.0000 mL | Freq: Once | OROMUCOSAL | Status: AC

## 2020-09-12 MED ORDER — NA CHONDROIT SULF-NA HYALURON 40-30 MG/ML IO SOSY
INTRAOCULAR | Status: DC | PRN
  Administered 2020-09-12: 0.5 mL via INTRAOCULAR

## 2020-09-12 MED ORDER — OXYCODONE HCL 5 MG/5ML PO SOLN: 5.0000 mg | Freq: Once | ORAL | Status: AC | PRN

## 2020-09-12 MED ORDER — DORZOLAMIDE HCL-TIMOLOL MAL 2-0.5 % OP SOLN
OPHTHALMIC | Status: DC | PRN
  Administered 2020-09-12: 1 [drp] via OPHTHALMIC

## 2020-09-12 MED ORDER — CHLORHEXIDINE GLUCONATE 0.12 % MT SOLN: 15.0000 mL | Freq: Once | OROMUCOSAL | Status: AC

## 2020-09-12 MED ORDER — MEPERIDINE HCL 25 MG/ML IJ SOLN: 6.2500 mg | INTRAMUSCULAR | Status: DC | PRN

## 2020-09-12 MED ORDER — PROPOFOL 10 MG/ML IV BOLUS
INTRAVENOUS | Status: DC | PRN
  Administered 2020-09-12: 120 mg via INTRAVENOUS

## 2020-09-12 MED ORDER — 0.9 % SODIUM CHLORIDE (POUR BTL) OPTIME
TOPICAL | Status: DC | PRN
  Administered 2020-09-12: 1000 mL

## 2020-09-12 MED ORDER — TRIAMCINOLONE ACETONIDE 40 MG/ML IJ SUSP
INTRAMUSCULAR | Status: DC | PRN
  Administered 2020-09-12: 40 mg

## 2020-09-12 MED ORDER — SODIUM CHLORIDE 0.9 % IV SOLN: INTRAVENOUS | Status: DC

## 2020-09-12 MED ORDER — BACITRACIN-POLYMYXIN B 500-10000 UNIT/GM OP OINT
TOPICAL_OINTMENT | OPHTHALMIC | Status: DC | PRN
  Administered 2020-09-12: 1 via OPHTHALMIC

## 2020-09-12 MED ORDER — ONDANSETRON HCL 4 MG/2ML IJ SOLN
INTRAMUSCULAR | Status: DC | PRN
  Administered 2020-09-12: 4 mg via INTRAVENOUS

## 2020-09-12 MED ORDER — OXYCODONE HCL 5 MG PO TABS
5.0000 mg | ORAL_TABLET | Freq: Once | ORAL | Status: AC | PRN
  Administered 2020-09-12: 5 mg via ORAL

## 2020-09-12 MED ORDER — ATROPINE SULFATE 1 % OP SOLN
1.0000 [drp] | OPHTHALMIC | Status: AC | PRN
  Administered 2020-09-12 (×2): 1 [drp] via OPHTHALMIC

## 2020-09-12 MED ORDER — BSS PLUS IO SOLN
INTRAOCULAR | Status: AC
  Filled 2020-09-12: qty 500

## 2020-09-12 MED ORDER — EPINEPHRINE PF 1 MG/ML IJ SOLN
INTRAMUSCULAR | Status: AC
  Filled 2020-09-12: qty 1

## 2020-09-12 MED ORDER — NA CHONDROIT SULF-NA HYALURON 40-30 MG/ML IO SOSY
INTRAOCULAR | Status: AC
  Filled 2020-09-12: qty 1

## 2020-09-12 MED ORDER — ACETAMINOPHEN 500 MG PO TABS
1000.0000 mg | ORAL_TABLET | Freq: Once | ORAL | Status: AC
  Administered 2020-09-12: 1000 mg via ORAL
  Filled 2020-09-12: qty 2

## 2020-09-12 MED ORDER — PHENYLEPHRINE HCL 10 % OP SOLN
OPHTHALMIC | Status: AC
  Administered 2020-09-12: 1 [drp] via OPHTHALMIC
  Filled 2020-09-12: qty 5

## 2020-09-12 MED ORDER — FENTANYL CITRATE (PF) 250 MCG/5ML IJ SOLN
INTRAMUSCULAR | Status: DC | PRN
  Administered 2020-09-12: 50 ug via INTRAVENOUS

## 2020-09-12 MED ORDER — BACITRACIN-POLYMYXIN B 500-10000 UNIT/GM OP OINT
TOPICAL_OINTMENT | OPHTHALMIC | Status: AC
  Filled 2020-09-12: qty 3.5

## 2020-09-12 MED ORDER — GATIFLOXACIN 0.5 % OP SOLN
OPHTHALMIC | Status: AC
  Filled 2020-09-12: qty 2.5

## 2020-09-12 MED ORDER — DEXAMETHASONE SODIUM PHOSPHATE 10 MG/ML IJ SOLN
INTRAMUSCULAR | Status: AC
  Filled 2020-09-12: qty 1

## 2020-09-12 MED ORDER — LIDOCAINE 2% (20 MG/ML) 5 ML SYRINGE
INTRAMUSCULAR | Status: AC
  Filled 2020-09-12: qty 5

## 2020-09-12 MED ORDER — PROPOFOL 10 MG/ML IV BOLUS
INTRAVENOUS | Status: AC
  Filled 2020-09-12: qty 20

## 2020-09-12 MED ORDER — PROPARACAINE HCL 0.5 % OP SOLN
1.0000 [drp] | OPHTHALMIC | Status: AC | PRN
  Administered 2020-09-12 (×2): 1 [drp] via OPHTHALMIC

## 2020-09-12 MED ORDER — POLYMYXIN B SULFATE 500000 UNITS IJ SOLR
INTRAMUSCULAR | Status: AC
  Filled 2020-09-12: qty 500000

## 2020-09-12 MED ORDER — FENTANYL CITRATE (PF) 250 MCG/5ML IJ SOLN
INTRAMUSCULAR | Status: AC
  Filled 2020-09-12: qty 5

## 2020-09-12 MED ORDER — ROCURONIUM BROMIDE 10 MG/ML (PF) SYRINGE
PREFILLED_SYRINGE | INTRAVENOUS | Status: AC
  Filled 2020-09-12: qty 10

## 2020-09-12 MED ORDER — PREDNISOLONE ACETATE 1 % OP SUSP
OPHTHALMIC | Status: DC | PRN
  Administered 2020-09-12: 1 [drp] via OPHTHALMIC

## 2020-09-12 MED ORDER — DORZOLAMIDE HCL-TIMOLOL MAL 2-0.5 % OP SOLN
OPHTHALMIC | Status: AC
  Filled 2020-09-12: qty 10

## 2020-09-12 MED ORDER — BRIMONIDINE TARTRATE 0.2 % OP SOLN
OPHTHALMIC | Status: DC | PRN
  Administered 2020-09-12: 1 [drp] via OPHTHALMIC

## 2020-09-12 MED ORDER — CEFTAZIDIME 1 G IJ SOLR
INTRAMUSCULAR | Status: AC
  Filled 2020-09-12: qty 1

## 2020-09-12 MED ORDER — PROMETHAZINE HCL 25 MG/ML IJ SOLN: 6.2500 mg | INTRAMUSCULAR | Status: DC | PRN

## 2020-09-12 MED ORDER — FENTANYL CITRATE (PF) 100 MCG/2ML IJ SOLN
INTRAMUSCULAR | Status: AC
  Filled 2020-09-12: qty 2

## 2020-09-12 MED ORDER — TROPICAMIDE 1 % OP SOLN
OPHTHALMIC | Status: AC
  Administered 2020-09-12: 1 [drp] via OPHTHALMIC
  Filled 2020-09-12: qty 15

## 2020-09-12 MED ORDER — SUGAMMADEX SODIUM 200 MG/2ML IV SOLN
INTRAVENOUS | Status: DC | PRN
  Administered 2020-09-12: 250 mg via INTRAVENOUS

## 2020-09-12 MED ORDER — TRIAMCINOLONE ACETONIDE 40 MG/ML IJ SUSP
INTRAMUSCULAR | Status: AC
  Filled 2020-09-12: qty 5

## 2020-09-12 MED ORDER — TROPICAMIDE 1 % OP SOLN
1.0000 [drp] | OPHTHALMIC | Status: AC | PRN
  Administered 2020-09-12 (×2): 1 [drp] via OPHTHALMIC

## 2020-09-12 MED ORDER — CHLORHEXIDINE GLUCONATE 0.12 % MT SOLN
OROMUCOSAL | Status: AC
  Administered 2020-09-12: 15 mL via OROMUCOSAL
  Filled 2020-09-12: qty 15

## 2020-09-12 MED ORDER — OXYCODONE HCL 5 MG PO TABS
ORAL_TABLET | ORAL | Status: AC
  Filled 2020-09-12: qty 1

## 2020-09-12 MED ORDER — PHENYLEPHRINE 40 MCG/ML (10ML) SYRINGE FOR IV PUSH (FOR BLOOD PRESSURE SUPPORT)
PREFILLED_SYRINGE | INTRAVENOUS | Status: AC
  Filled 2020-09-12: qty 10

## 2020-09-12 MED ORDER — PREDNISOLONE ACETATE 1 % OP SUSP
OPHTHALMIC | Status: AC
  Filled 2020-09-12: qty 5

## 2020-09-12 MED ORDER — PHENYLEPHRINE HCL 10 % OP SOLN
1.0000 [drp] | OPHTHALMIC | Status: AC | PRN
  Administered 2020-09-12 (×2): 1 [drp] via OPHTHALMIC

## 2020-09-12 MED ORDER — LIDOCAINE 2% (20 MG/ML) 5 ML SYRINGE
INTRAMUSCULAR | Status: DC | PRN
  Administered 2020-09-12: 20 mg via INTRAVENOUS

## 2020-09-12 SURGICAL SUPPLY — 67 items
APL SWBSTK 6 STRL LF DISP (MISCELLANEOUS) ×10
APPLICATOR COTTON TIP 6 STRL (MISCELLANEOUS) ×10 IMPLANT
APPLICATOR COTTON TIP 6IN STRL (MISCELLANEOUS) ×20
BAND WRIST GAS GREEN (MISCELLANEOUS) IMPLANT
BETADINE 5% OPHTHALMIC (OPHTHALMIC) ×2 IMPLANT
BLADE EYE CATARACT 19 1.4 BEAV (BLADE) IMPLANT
BLADE MVR KNIFE 20G (BLADE) ×2 IMPLANT
BNDG EYE OVAL (GAUZE/BANDAGES/DRESSINGS) ×2 IMPLANT
CABLE BIPOLOR RESECTION CORD (MISCELLANEOUS) ×2 IMPLANT
CANNULA ANT CHAM MAIN (OPHTHALMIC RELATED) IMPLANT
CANNULA FLEX TIP 25G (CANNULA) ×2 IMPLANT
CANNULA TROCAR 23 GA VLV (OPHTHALMIC) IMPLANT
CANNULA TROCAR 23G VLV (OPHTHALMIC) IMPLANT
CANNULA VLV SOFT TIP 25G (OPHTHALMIC) IMPLANT
CANNULA VLV SOFT TIP 25GA (OPHTHALMIC) ×2 IMPLANT
CLSR STERI-STRIP ANTIMIC 1/2X4 (GAUZE/BANDAGES/DRESSINGS) ×2 IMPLANT
COVER WAND RF STERILE (DRAPES) ×2 IMPLANT
DRAPE MICROSCOPE LEICA 46X105 (MISCELLANEOUS) ×2 IMPLANT
DRAPE OPHTHALMIC 77X100 STRL (CUSTOM PROCEDURE TRAY) ×2 IMPLANT
ERASER HMR WETFIELD 23G BP (MISCELLANEOUS) IMPLANT
FILTER BLUE MILLIPORE (MISCELLANEOUS) ×1 IMPLANT
FILTER STRAW FLUID ASPIR (MISCELLANEOUS) IMPLANT
FORCEPS GRIESHABER ILM 25G A (INSTRUMENTS) IMPLANT
GAS WRIST BAND GREEN (MISCELLANEOUS)
GLOVE BIO SURGEON STRL SZ7.5 (GLOVE) ×4 IMPLANT
GLOVE BIOGEL M 7.0 STRL (GLOVE) ×2 IMPLANT
GOWN STRL REUS W/ TWL LRG LVL3 (GOWN DISPOSABLE) ×2 IMPLANT
GOWN STRL REUS W/ TWL XL LVL3 (GOWN DISPOSABLE) ×1 IMPLANT
GOWN STRL REUS W/TWL LRG LVL3 (GOWN DISPOSABLE) ×4
GOWN STRL REUS W/TWL XL LVL3 (GOWN DISPOSABLE) ×2
KIT BASIN OR (CUSTOM PROCEDURE TRAY) ×2 IMPLANT
KIT PERFLUORON PROCEDURE 5ML (MISCELLANEOUS) IMPLANT
LENS BIOM SUPER VIEW SET DISP (MISCELLANEOUS) IMPLANT
LENS VITRECTOMY FLAT OCLR DISP (MISCELLANEOUS) IMPLANT
MICROPICK 25G (MISCELLANEOUS)
NDL 18GX1X1/2 (RX/OR ONLY) (NEEDLE) ×1 IMPLANT
NDL 25GX 5/8IN NON SAFETY (NEEDLE) ×5 IMPLANT
NDL HYPO 30X.5 LL (NEEDLE) ×2 IMPLANT
NDL PRECISIONGLIDE 27X1.5 (NEEDLE) IMPLANT
NEEDLE 18GX1X1/2 (RX/OR ONLY) (NEEDLE) ×2 IMPLANT
NEEDLE 25GX 5/8IN NON SAFETY (NEEDLE) ×10 IMPLANT
NEEDLE HYPO 30X.5 LL (NEEDLE) ×4 IMPLANT
NEEDLE PRECISIONGLIDE 27X1.5 (NEEDLE) IMPLANT
NS IRRIG 1000ML POUR BTL (IV SOLUTION) ×2 IMPLANT
OPHTHALMIC BETADINE 5% (OPHTHALMIC) ×4
PACK VITRECTOMY CUSTOM (CUSTOM PROCEDURE TRAY) ×2 IMPLANT
PAD ARMBOARD 7.5X6 YLW CONV (MISCELLANEOUS) ×4 IMPLANT
PAK PIK VITRECTOMY CVS 25GA (OPHTHALMIC) ×2 IMPLANT
PENCIL BIPOLAR 25GA STR DISP (OPHTHALMIC RELATED) ×1 IMPLANT
PICK MICROPICK 25G (MISCELLANEOUS) IMPLANT
PROBE DIATHERMY DSP 27GA (MISCELLANEOUS) IMPLANT
PROBE ENDO DIATHERMY 25G (MISCELLANEOUS) ×1 IMPLANT
PROBE LASER ILLUM FLEX CVD 25G (OPHTHALMIC) ×1 IMPLANT
RESERVOIR BACK FLUSH (MISCELLANEOUS) IMPLANT
RETRACTOR IRIS FLEX 25G GRIESH (INSTRUMENTS) IMPLANT
SHIELD EYE LENSE ONLY DISP (GAUZE/BANDAGES/DRESSINGS) ×1 IMPLANT
SPONGE SURGIFOAM ABS GEL 12-7 (HEMOSTASIS) IMPLANT
STOPCOCK 4 WAY LG BORE MALE ST (IV SETS) IMPLANT
SUT VICRYL 7 0 TG140 8 (SUTURE) ×2 IMPLANT
SYR 10ML LL (SYRINGE) ×2 IMPLANT
SYR 20ML LL LF (SYRINGE) ×2 IMPLANT
SYR 5ML LL (SYRINGE) ×2 IMPLANT
SYR BULB EAR ULCER 3OZ GRN STR (SYRINGE) ×2 IMPLANT
SYR TB 1ML LUER SLIP (SYRINGE) ×1 IMPLANT
TOWEL GREEN STERILE FF (TOWEL DISPOSABLE) ×2 IMPLANT
TUBING HIGH PRESS EXTEN 6IN (TUBING) ×2 IMPLANT
WATER STERILE IRR 1000ML POUR (IV SOLUTION) ×2 IMPLANT

## 2020-09-12 NOTE — Anesthesia Procedure Notes (Signed)
Procedure Name: Intubation Date/Time: 09/12/2020 2:33 PM Performed by: Thelma Comp, CRNA Pre-anesthesia Checklist: Patient identified, Emergency Drugs available, Suction available and Patient being monitored Patient Re-evaluated:Patient Re-evaluated prior to induction Oxygen Delivery Method: Circle System Utilized Preoxygenation: Pre-oxygenation with 100% oxygen Induction Type: IV induction Ventilation: Mask ventilation without difficulty Laryngoscope Size: Mac and 3 Grade View: Grade II Tube type: Oral Tube size: 7.0 mm Number of attempts: 1 Airway Equipment and Method: Stylet Placement Confirmation: ETT inserted through vocal cords under direct vision,  positive ETCO2 and breath sounds checked- equal and bilateral Secured at: 22 cm Tube secured with: Tape Dental Injury: Teeth and Oropharynx as per pre-operative assessment

## 2020-09-12 NOTE — Anesthesia Postprocedure Evaluation (Signed)
Anesthesia Post Note  Patient: Nicole Holt  Procedure(s) Performed: PARS PLANA VITRECTOMY WITH 23 GAUGE (Right Eye) REMOVAL RETAINED LENS (Right Eye) PHOTOCOAGULATION WITH LASER (Right Eye) AIR/FLUID EXCHANGE (Right Eye)     Patient location during evaluation: PACU Anesthesia Type: General Level of consciousness: awake and alert, patient cooperative and oriented Pain management: pain level controlled Vital Signs Assessment: post-procedure vital signs reviewed and stable Respiratory status: spontaneous breathing, nonlabored ventilation and respiratory function stable Cardiovascular status: blood pressure returned to baseline and stable Postop Assessment: no apparent nausea or vomiting and able to ambulate Anesthetic complications: no   No complications documented.  Last Vitals:  Vitals:   09/12/20 1705 09/12/20 1720  BP: 114/70 132/70  Pulse: 67 68  Resp: 14 14  Temp:  36.4 C  SpO2: 97% 99%    Last Pain:  Vitals:   09/12/20 1720  TempSrc:   PainSc: Asleep                 Clennon Nasca,E. Cornelius Marullo

## 2020-09-12 NOTE — Transfer of Care (Signed)
Immediate Anesthesia Transfer of Care Note  Patient: Nicole Holt  Procedure(s) Performed: PARS PLANA VITRECTOMY WITH 23 GAUGE (Right Eye) REMOVAL RETAINED LENS (Right Eye) PHOTOCOAGULATION WITH LASER (Right Eye) AIR/FLUID EXCHANGE (Right Eye)  Patient Location: PACU  Anesthesia Type:General  Level of Consciousness: drowsy and patient cooperative  Airway & Oxygen Therapy: Patient Spontanous Breathing and Patient connected to face mask oxygen  Post-op Assessment: Report given to RN and Post -op Vital signs reviewed and stable  Post vital signs: Reviewed and stable  Last Vitals:  Vitals Value Taken Time  BP 123/86   Temp    Pulse 66 09/12/20 1616  Resp    SpO2 96 % 09/12/20 1616  Vitals shown include unvalidated device data.  Last Pain:  Vitals:   09/12/20 1220  TempSrc: Oral  PainSc: 0-No pain      Patients Stated Pain Goal: 4 (65/79/03 8333)  Complications: No complications documented.

## 2020-09-12 NOTE — Interval H&P Note (Signed)
History and Physical Interval Note:  09/12/2020 1:43 PM  Nicole Holt  has presented today for surgery, with the diagnosis of retained lens material, right eye.  The various methods of treatment have been discussed with the patient and family. After consideration of risks, benefits and other options for treatment, the patient has consented to  Procedure(s): PARS PLANA VITRECTOMY WITH 23 GAUGE (Right) REMOVAL RETAINED LENS (Right) as a surgical intervention.  The patient's history has been reviewed, patient examined, no change in status, stable for surgery.  I have reviewed the patient's chart and labs.  Questions were answered to the patient's satisfaction.     Bernarda Caffey

## 2020-09-12 NOTE — Discharge Instructions (Signed)
POSTOPERATIVE INSTRUCTIONS  Your doctor has performed vitreoretinal surgery on you at [facility]. [place] eye patched and shielded until seen by Dr. Coralyn Pear 8 AM tomorrow in clinic - Do not use drops until return - Keep head elevated - Sleep with head elevated 30-45 degrees, avoid laying flat on back.    - No strenuous bending, stooping or lifting.  - You may not drive until further notice.  - If your doctor used a gas bubble in your eye during the procedure he will advise you on postoperative positioning. If you have a gas bubble you will be wearing a green bracelet that was applied in the operating room. The green bracelet should stay on as long as the gas bubble is in your eye. While the gas bubble is present you should not fly in an airplane. If you require general anesthesia while the gas bubble is present you must notify your anesthesiologist that an intraocular gas bubble is present so he can take the appropriate precautions.  - Tylenol or any other over-the-counter pain reliever can be used according to your doctor. If more pain medicine is required, your doctor will have a prescription for you.  - You may read, go up and down stairs, and watch television.     Bernarda Caffey, M.D., Ph.D.

## 2020-09-12 NOTE — Brief Op Note (Signed)
09/12/2020  4:14 PM  PATIENT:  [name]  79 y.o. female  PRE-OPERATIVE DIAGNOSIS:  retained lens material, right eye  POST-OPERATIVE DIAGNOSIS:  retained lens material, right eye  PROCEDURE:  Procedure(s): PARS PLANA VITRECTOMY WITH 23 GAUGE (Right) REMOVAL RETAINED LENS (Right) PHOTOCOAGULATION WITH LASER (Right) AIR/FLUID EXCHANGE (Right)  SURGEON:  Surgeon(s) and Role:    Bernarda Caffey, MD - Primary  ASSISTANTS: Ernest Mallick, Ophthalmic Assistant    ANESTHESIA:   general  EBL:  minimal   BLOOD ADMINISTERED:none  DRAINS: none   LOCAL MEDICATIONS USED:  NONE  SPECIMEN:  No Specimen  DISPOSITION OF SPECIMEN:  N/A  COUNTS:  YES  TOURNIQUET:  * No tourniquets in log *  DICTATION: .Note written in EPIC  PLAN OF CARE: Discharge to home after PACU  PATIENT DISPOSITION:  PACU - hemodynamically stable.   Delay start of Pharmacological VTE agent (>24hrs) due to surgical blood loss or risk of bleeding: not applicable

## 2020-09-12 NOTE — Op Note (Signed)
Date of procedure: 11.11.21   Surgeon: Bernarda Caffey, M.D., Ph.D    Assistant: Ernest Mallick, OA   Pre-operative Diagnosis:  1. Retained lens material, Right eye   Post-operative diagnosis:  Same   Anesthesia: General   Procedure: 1)     23 gauge pars plana vitrectomy w/ endolaser, Right Eye 2)     Pars plana lensectomy, Right Eye   Complications: none Estimated blood loss: minimal Specimens: none   Brief history:   The patient has a history of decreased vision in the affected eye, and on examination, was noted to have retained lens material in the vitreous. The risks, benefits, and alternatives were explained to the patient, including pain, bleeding, infection, loss of vision, double vision, droopy eyelids, and need for more surgeries.  Informed consent was obtained from the patient and placed in the chart.     Procedure: The patient was brought to the preoperative holding area where the correct eye was confirmed and marked.  The patient was then brought to the operating room where general endotracheal anesthesia was induced by the Anesthesia team without complication. A time-out was performed to identify the correct patient, eye, procedure, and any allergies. The eye was prepped and draped in the usual sterile ophthalmic fashion followed by placement of a lid speculum.    A 23 gauge trocar was placed in the inferotemporal quadrant 3.5 mm posterior to the limbus in a beveled fashion. A 4 mm infusion cannula was placed through this trocar, and the infusion cannula was confirmed in the vitreous cavity with no incarceration of retina or choroid prior to turning it on. Two additional 23 gauge trocars were placed in the superonasal and superotemporal quadrants in a similar beveled fashion. The light pipe and cutter were introduced into the eye. Direct vitrectomy was was performed to remove the anterior hyaloid and residual cortical cataract and capsule.    At this time, a standard three-port  pars plana vitrectomy was performed using the light pipe, the cutter, and the BIOM viewing system. Kenalog was used to highlight the vitreous. Care was taken to insure the posterior vitreous was detached. A thorough core and peripheral vitreous dissection was performed. Nuclear and cortical fragments of crystalline lens were removed using the light pipe and vitrectomy probe. Next, prophylactic endolaser was placed 360 using the endoilluminated laser probe and scleral depressor. Of note, there were no peripheral breaks or tears on careful peripheral inspection.   The trocars were then removed and sutured with 7-0 vicryl in an interrupted fashion. A 10-0 nylon corneal suture in the temporal cataract wound was removed with Vannas scissors and tying forceps. Subconjunctival injections of antibiotic and Kenalog were administered. The lid speculum and drapes were removed. Drops of an antibiotic and steroid were given. The eye was patched and shielded. The patient tolerated the procedure well without any intraoperative or immediate postoperative complications. The patient was taken to the recovery room in good condition. The patient was instructed to follow-up with Dr. Coralyn Pear in clinic on the following morning.

## 2020-09-12 NOTE — Anesthesia Preprocedure Evaluation (Addendum)
Anesthesia Evaluation  Patient identified by MRN, date of birth, ID band Patient awake    Reviewed: Allergy & Precautions, NPO status , Patient's Chart, lab work & pertinent test results  History of Anesthesia Complications Negative for: history of anesthetic complications  Airway Mallampati: II  TM Distance: >3 FB Neck ROM: Full    Dental  (+) Caps, Dental Advisory Given   Pulmonary sleep apnea and Continuous Positive Airway Pressure Ventilation , former smoker,  09/09/2020 SARS coronavirus NEG   breath sounds clear to auscultation       Cardiovascular negative cardio ROS   Rhythm:Regular Rate:Normal     Neuro/Psych Anxiety Depression glaucoma    GI/Hepatic Neg liver ROS, GERD  Controlled and Medicated,  Endo/Other  obese  Renal/GU negative Renal ROS     Musculoskeletal   Abdominal (+) + obese,   Peds  Hematology negative hematology ROS (+)   Anesthesia Other Findings   Reproductive/Obstetrics                          Anesthesia Physical Anesthesia Plan  ASA: III  Anesthesia Plan: General   Post-op Pain Management:    Induction: Intravenous  PONV Risk Score and Plan: 3 and Ondansetron, Dexamethasone and Treatment may vary due to age or medical condition  Airway Management Planned: Oral ETT  Additional Equipment: None  Intra-op Plan:   Post-operative Plan: Extubation in OR  Informed Consent: I have reviewed the patients History and Physical, chart, labs and discussed the procedure including the risks, benefits and alternatives for the proposed anesthesia with the patient or authorized representative who has indicated his/her understanding and acceptance.     Dental advisory given  Plan Discussed with: CRNA and Surgeon  Anesthesia Plan Comments:        Anesthesia Quick Evaluation

## 2020-09-13 ENCOUNTER — Ambulatory Visit (INDEPENDENT_AMBULATORY_CARE_PROVIDER_SITE_OTHER): Payer: Medicare HMO | Admitting: Ophthalmology

## 2020-09-13 ENCOUNTER — Encounter (HOSPITAL_COMMUNITY): Payer: Self-pay | Admitting: Ophthalmology

## 2020-09-13 DIAGNOSIS — I10 Essential (primary) hypertension: Secondary | ICD-10-CM

## 2020-09-13 DIAGNOSIS — Z961 Presence of intraocular lens: Secondary | ICD-10-CM

## 2020-09-13 DIAGNOSIS — H35033 Hypertensive retinopathy, bilateral: Secondary | ICD-10-CM

## 2020-09-13 DIAGNOSIS — H3581 Retinal edema: Secondary | ICD-10-CM

## 2020-09-13 DIAGNOSIS — H59021 Cataract (lens) fragments in eye following cataract surgery, right eye: Secondary | ICD-10-CM

## 2020-09-13 DIAGNOSIS — H40051 Ocular hypertension, right eye: Secondary | ICD-10-CM

## 2020-09-13 NOTE — Progress Notes (Addendum)
[facility] Note  09/13/2020     CHIEF COMPLAINT Patient presents for Retina Follow Up   HISTORY OF PRESENT ILLNESS: Nicole Holt is a 79 y.o. female who presents to the clinic today for:   HPI    Retina Follow Up    Patient presents with  Other.  In right eye.  This started 1 day ago.  I, the attending physician,  performed the HPI with the patient and updated documentation appropriately.          Comments    Patient here for 1 day retina follow up for POV OD. Patient states was afraid to have the patched removed because of not knowing what to see. Slept 11:30 pm to 4:00 am. Got up then went back to sleep for couple more hours. Took Tylenol 500 mg to prevent head pain.       Last edited by Bernarda Caffey, MD on 09/15/2020  9:44 PM. (History)    Pt states her vision is doing better, she can see a bubble in her vision  Referring physician: Marylynn Pearson, MD [address] [place],  [place] [id number]  HISTORICAL INFORMATION:   Selected notes from the MEDICAL RECORD NUMBER Referred by Dr. Venetia Maxon for retained lens material OD LEE:  Ocular Hx- PMH-    CURRENT MEDICATIONS: Current Outpatient Medications (Ophthalmic Drugs)  Medication Sig  . brimonidine (ALPHAGAN) 0.15 % ophthalmic solution Place 1 drop into the right eye 3 (three) times daily.  . Bromfenac Sodium (PROLENSA) 0.07 % SOLN Place 1 drop into the right eye in the morning, at noon, in the evening, and at bedtime.  . dorzolamide-timolol (COSOPT) 22.3-6.8 MG/ML ophthalmic solution Place 1 drop into the right eye in the morning, at noon, and at bedtime.  Marland Kitchen ofloxacin (OCUFLOX) 0.3 % ophthalmic solution Place 1 drop into the right eye 5 (five) times daily.   . prednisoLONE acetate (PRED FORTE) 1 % ophthalmic suspension Place 1 drop into the right eye 6 (six) times daily.   . Travoprost, BAK Free, (TRAVATAN) 0.004 % SOLN ophthalmic solution Place 1 drop into both eyes at bedtime.    No current facility-administered medications for this visit. (Ophthalmic Drugs)   Current Outpatient Medications (Other)  Medication Sig  . acetaZOLAMIDE (DIAMOX SEQUELS) 500 MG capsule Take 1 capsule (500 mg total) by mouth 2 (two) times daily.  . calcium carbonate (TUMS - DOSED IN MG ELEMENTAL CALCIUM) 500 MG chewable tablet Chew 1-2 tablets by mouth 3 (three) times daily as needed for indigestion or heartburn.  . Calcium Carbonate-Vit D-Min (QC CALCIUM-MAGNESIUM-ZINC-D3 PO) Take 1 tablet by mouth once a week.  . diazepam (VALIUM) 5 MG tablet Take 1 tablet (5 mg total) by mouth every 6 (six) hours as needed. for anxiety (Patient taking differently: Take 5 mg by mouth every 6 (six) hours as needed (panic attacks.). )  . fluticasone (FLONASE) 50 MCG/ACT nasal spray Use 2 spray(s) in each nostril once daily (Patient taking differently: Place 2 sprays into both nostrils daily as needed for allergies. )  . gabapentin (NEURONTIN) 100 MG capsule Take 1-3 capsules (100-300 mg total) by mouth 3 (three) times daily as needed (nerve pain).  . melatonin 3 MG TABS tablet Take 3 mg by mouth at bedtime as needed (sleep).  . Omega-3 Fatty Acids (FISH OIL PO) Take 1 capsule by mouth every 14 (fourteen) days.   . polyethylene glycol (MIRALAX / GLYCOLAX) 17 g packet Take 17  g by mouth daily.  . Potassium 99 MG TABS Take 99 mg by mouth 2 (two) times a week.  . predniSONE (DELTASONE) 50 MG tablet Take 1 tablet (50 mg total) by mouth daily. (Patient not taking: Reported on 09/11/2020)  . Turmeric (QC TUMERIC COMPLEX PO) Take 1 capsule by mouth daily.    No current facility-administered medications for this visit. (Other)      REVIEW OF SYSTEMS: ROS    Positive for: Neurological, Musculoskeletal, HENT, Eyes, Respiratory   Negative for: Constitutional, Gastrointestinal, Skin, Genitourinary, Endocrine, Cardiovascular, Psychiatric, Allergic/Imm, Heme/Lymph   Last edited by Theodore Demark, COA on 09/13/2020   8:28 AM. (History)       ALLERGIES Allergies  Allergen Reactions  . Codeine Nausea Only  . Estrogens Conjugated Other (See Comments)    HA  . Paroxetine Hcl Other (See Comments)    Felt like she was "out of her head"    PAST MEDICAL HISTORY Past Medical History:  Diagnosis Date  . Allergy   . Anxiety    panic attack  . Depression   . GERD (gastroesophageal reflux disease)   . Glaucoma   . Glaucoma   . Hearing loss   . 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 Hyperlipidemia   . Hypertensive retinopathy    OU  . Migraines    ocular  . OSA on CPAP 02/02/2014   PSG 09-27-2012 RDI 18.9 and AHi 9.8, Epworth 15 .titrated to only 5 cm water.   . Osteoporosis   . Sleep apnea   . Tinnitus    Past Surgical History:  Procedure Laterality Date  . AIR/FLUID EXCHANGE Right 09/12/2020   Procedure: AIR/FLUID EXCHANGE;  Surgeon: Bernarda Caffey, MD;  Location: [place];  Service: Ophthalmology;  Laterality: Right;  . CATARACT EXTRACTION Bilateral OD: 10.25.21 OS: 12.2015   Dr. Venetia Maxon  . cataract surgery    . COSMETIC SURGERY    . EYE SURGERY Bilateral    Cat Sx - Dr. Venetia Maxon  . PARS PLANA VITRECTOMY Right 09/12/2020   Procedure: PARS PLANA VITRECTOMY WITH 23 GAUGE;  Surgeon: Bernarda Caffey, MD;  Location: [place];  Service: Ophthalmology;  Laterality: Right;  . PHOTOCOAGULATION WITH LASER Right 09/12/2020   Procedure: PHOTOCOAGULATION WITH LASER;  Surgeon: Bernarda Caffey, MD;  Location: [place];  Service: Ophthalmology;  Laterality: Right;  . REMOVAL RETAINED LENS Right 09/12/2020   Procedure: REMOVAL RETAINED LENS;  Surgeon: Bernarda Caffey, MD;  Location: [place];  Service: Ophthalmology;  Laterality: Right;    FAMILY HISTORY Family History  Problem Relation Age of Onset  . Osteoporosis Mother   . Stroke Mother   . Hyperlipidemia Mother   . Hypertension Mother   . Kidney disease Father   . Congestive Heart Failure Father   . Heart disease Father   .  Hyperlipidemia Father   . Stroke Sister   . Parkinson's disease Sister   . Heart disease Sister   . Heart disease Maternal Grandmother   . Gout Maternal Grandmother   . Stroke Maternal Grandfather   . Stroke Paternal Grandmother   . Thrombosis Paternal Grandfather   . Early death Paternal Grandfather   . Hypertension Brother   . Diabetes Brother   . Arthritis/Rheumatoid Other   . Parkinson's disease Other   . Heart disease Other   . Obesity Daughter   . Breast cancer Neg Hx     SOCIAL HISTORY Social History   Tobacco Use  .  Smoking status: Former Research scientist (life sciences)  . Smokeless tobacco: Never Used  . Tobacco comment: Quit in 1987  Vaping Use  . Vaping Use: Never used  Substance Use Topics  . Alcohol use: Yes    Alcohol/week: 12.0 standard drinks    Types: 12 Glasses of wine per week    Comment: wine or alcohol daily  . Drug use: No         OPHTHALMIC EXAM:  Base Eye Exam    Visual Acuity (Snellen - Linear)      Right Left   Dist [place] 20/40 +1    Dist ph [place] 20/20 -2        Tonometry (Tonopen, 8:24 AM)      Right Left   Pressure 17 def       Visual Fields      Left Right    Full Full       Extraocular Movement      Right Left    Full, Ortho Full, Ortho       Neuro/Psych    Oriented x3: Yes   Mood/Affect: Normal       Dilation    Right eye: 1.0% Mydriacyl, 2.5% Phenylephrine @ 8:24 AM        Slit Lamp and Fundus Exam    Slit Lamp Exam      Right Left   Lids/Lashes Dermatochalasis - upper lid Dermatochalasis - upper lid   Conjunctiva/Sclera Subconjunctival hemorrhage, sutures intact White and quiet   Cornea 1+ peripheral Punctate epithelial erosions, trace tear film debris, well healed temporal cataract wound, arcus 1+Punctate epithelial erosions, well healed temporal cataract wounds   Anterior Chamber Deep, 3+ Cell/pigment Deep and quiet, Deep   Iris Round and dilated, mild iridodenesis Round and dilated   Lens 3 piece IOL in good position, open PC,  cortical remnants temporal hemisphere PC IOL in good position with trace PCO non-central   Vitreous post vitrectomy, +cell, 30% air bubble; retained lens fragments gone syneresis       Fundus Exam      Right Left   Disc Pink and Sharp, mild tilt, +cupping, mild PPP    C/D Ratio 0.6 0.5   Macula Flat, Blunted foveal reflex, No heme or edema    Vessels attenuated, Tortuous    Periphery attached, retained lens material gone; 360 peripheral laser changes           IMAGING AND PROCEDURES  Imaging and Procedures for 09/13/2020           ASSESSMENT/PLAN:    ICD-10-CM   1. Retained lens material following cataract surgery of right eye  H59.021   2. Retinal edema  H35.81   3. Essential hypertension  I10   4. Hypertensive retinopathy of both eyes  H35.033   5. 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 Retained lens material OD  - s/p CE, sulcus IOL on Monday 10.25.21  - broken posterior capsule, nuclear and cortical lens fragments in vitreous  - had IOP spike to 40 on 11.3.21  - improved post drops, AC tap and diamox by Dr. Venetia Maxon  - 3+ AC cell  - persistently elevated IOP and microcystic corneal edema  - now POD1 s/p PPV/EL OD 11.11.21             - doing well this morning  - VA ph 20/20 -2             -  IOP good at 17             - start   PF 4x/day OD                         zymaxid QID OD    Brimonidine BID OD                         Atropine BID OD                         PSO ung QID OD  - stop  PO diamox               - eye shield when sleeping              - post op drop and positioning instructions reviewed; avoid laying flat on back             - tylenol/ibuprofen for pain  - f/u 1 week, POV, DFE, OCT  2. No retinal edema on exam or OCT  3,4. Hypertensive retinopathy OU - discussed importance of tight BP control - monitor  5. Pseudophakia OU  - s/p CE/IOL (Dr. Venetia Maxon, OD: 10.25.21, OS: 12.2015)  - IOLs in  good position  - retained lens material OD as above - monitor  Ophthalmic Meds Ordered this visit:  No orders of the defined types were placed in this encounter.      Return in about 5 days (around 09/18/2020) for f/u retained lens material OD, POV, DFE, OCT.  There are no Patient Instructions on file for this visit.   Explained the diagnoses, plan, and follow up with the patient and they expressed understanding.  Patient expressed understanding of the importance of proper follow up care.   This document serves as a record of services personally performed by Gardiner Sleeper, MD, PhD. It was created on their behalf by San Jetty. Owens Shark, OA an ophthalmic technician. The creation of this record is the provider's dictation and/or activities during the visit.    Electronically signed by: San Jetty. Owens Shark, [place] 11.12.2021 9:51 PM  Gardiner Sleeper, M.D., Ph.D. Diseases & Surgery of the Retina and Vitreous [facility]  I have reviewed the above documentation for accuracy and completeness, and I agree with the above. Gardiner Sleeper, M.D., Ph.D. 09/15/20 9:51 PM   Abbreviations: M myopia (nearsighted); A astigmatism; H hyperopia (farsighted); P presbyopia; Mrx spectacle prescription;  CTL contact lenses; OD right eye; OS left eye; OU both eyes  XT exotropia; ET esotropia; PEK punctate epithelial keratitis; PEE punctate epithelial erosions; DES dry eye syndrome; MGD meibomian gland dysfunction; ATs artificial tears; PFAT's preservative free artificial tears; [place] nuclear sclerotic cataract; PSC posterior subcapsular cataract; ERM epi-retinal membrane; PVD posterior vitreous detachment; RD retinal detachment; DM diabetes mellitus; DR diabetic retinopathy; NPDR non-proliferative diabetic retinopathy; PDR proliferative diabetic retinopathy; CSME clinically significant macular edema; DME diabetic macular edema; dbh dot blot hemorrhages; CWS cotton wool spot; POAG primary open angle  glaucoma; C/D cup-to-disc ratio; HVF humphrey visual field; GVF goldmann visual field; OCT optical coherence tomography; IOP intraocular pressure; BRVO Branch retinal vein occlusion; CRVO central retinal vein occlusion; CRAO central retinal artery occlusion; BRAO branch retinal artery occlusion; RT retinal tear; SB scleral buckle; PPV pars plana vitrectomy; VH Vitreous hemorrhage; PRP panretinal laser photocoagulation; IVK intravitreal kenalog; VMT vitreomacular traction; MH Macular hole;  NVD neovascularization of the disc; NVE neovascularization elsewhere; AREDS age related eye disease study; ARMD age related macular degeneration; POAG primary open angle glaucoma; EBMD epithelial/anterior basement membrane dystrophy; ACIOL anterior chamber intraocular lens; IOL intraocular lens; PCIOL posterior chamber intraocular lens; Phaco/IOL phacoemulsification with intraocular lens placement; [place] photorefractive keratectomy; LASIK laser assisted in situ keratomileusis; HTN hypertension; DM diabetes mellitus; COPD chronic obstructive pulmonary disease

## 2020-09-15 ENCOUNTER — Encounter (INDEPENDENT_AMBULATORY_CARE_PROVIDER_SITE_OTHER): Payer: Self-pay | Admitting: Ophthalmology

## 2020-09-16 NOTE — Progress Notes (Signed)
[facility] Note  09/18/2020     CHIEF COMPLAINT Patient presents for Post-op Follow-up   HISTORY OF PRESENT ILLNESS: Nicole Holt is a 78 y.o. female who presents to the clinic today for:   HPI    Post-op Follow-up    In right eye.  Discomfort includes floaters.  Vision is stable, is blurred at distance and is blurred at near.  I, the attending physician,  performed the HPI with the patient and updated documentation appropriately.          Comments    78 y/o female pt here for 5 day POV.  S/p PPV/EL OD for retained lens material 11.11.21.  No change in [place] OU.  Denies pain, FOL, but has several floaters OD.  PF QID OD Zymaxid QID OD Brimonidine BID OD Atropine BID OD PSO ung QID OD Travoprost QHS OU       Last edited by Bernarda Caffey, MD on 09/18/2020  1:31 PM. (History)    Pt states her vision is improving and things are "getting back to normal", she states her eye "feels good"  Referring physician: Vivi Barrack, MD [place],   [id number]  HISTORICAL INFORMATION:   Selected notes from the MEDICAL RECORD NUMBER Referred by Dr. Venetia Maxon for retained lens material OD LEE:  Ocular Hx- PMH-    CURRENT MEDICATIONS: Current Outpatient Medications (Ophthalmic Drugs)  Medication Sig  . brimonidine (ALPHAGAN) 0.15 % ophthalmic solution Place 1 drop into the right eye 3 (three) times daily.  . prednisoLONE acetate (PRED FORTE) 1 % ophthalmic suspension Place 1 drop into the right eye 6 (six) times daily.   . Travoprost, BAK Free, (TRAVATAN) 0.004 % SOLN ophthalmic solution Place 1 drop into both eyes at bedtime.  . Bromfenac Sodium (PROLENSA) 0.07 % SOLN Place 1 drop into the right eye in the morning, at noon, in the evening, and at bedtime. (Patient not taking: Reported on 09/18/2020)  . dorzolamide-timolol (COSOPT) 22.3-6.8 MG/ML ophthalmic solution Place 1 drop into the right eye in the morning, at noon, and at bedtime.  (Patient not taking: Reported on 09/18/2020)  . ofloxacin (OCUFLOX) 0.3 % ophthalmic solution Place 1 drop into the right eye 5 (five) times daily.  (Patient not taking: Reported on 09/18/2020)   No current facility-administered medications for this visit. (Ophthalmic Drugs)   Current Outpatient Medications (Other)  Medication Sig  . calcium carbonate (TUMS - DOSED IN MG ELEMENTAL CALCIUM) 500 MG chewable tablet Chew 1-2 tablets by mouth 3 (three) times daily as needed for indigestion or heartburn.  . Calcium Carbonate-Vit D-Min (QC CALCIUM-MAGNESIUM-ZINC-D3 PO) Take 1 tablet by mouth once a week.  . diazepam (VALIUM) 5 MG tablet Take 1 tablet (5 mg total) by mouth every 6 (six) hours as needed. for anxiety (Patient taking differently: Take 5 mg by mouth every 6 (six) hours as needed (panic attacks.). )  . fluticasone (FLONASE) 50 MCG/ACT nasal spray Use 2 spray(s) in each nostril once daily (Patient taking differently: Place 2 sprays into both nostrils daily as needed for allergies. )  . gabapentin (NEURONTIN) 100 MG capsule Take 1-3 capsules (100-300 mg total) by mouth 3 (three) times daily as needed (nerve pain).  . melatonin 3 MG TABS tablet Take 3 mg by mouth at bedtime as needed (sleep).  . Omega-3 Fatty Acids (FISH OIL PO) Take 1 capsule by mouth every 14 (fourteen) days.   . polyethylene glycol (MIRALAX / GLYCOLAX) 17  g packet Take 17 g by mouth daily.  . Potassium 99 MG TABS Take 99 mg by mouth 2 (two) times a week.  . predniSONE (DELTASONE) 50 MG tablet Take 1 tablet (50 mg total) by mouth daily.  . Turmeric (QC TUMERIC COMPLEX PO) Take 1 capsule by mouth daily.   Marland Kitchen acetaZOLAMIDE (DIAMOX SEQUELS) 500 MG capsule Take 1 capsule (500 mg total) by mouth 2 (two) times daily. (Patient not taking: Reported on 09/18/2020)   No current facility-administered medications for this visit. (Other)      REVIEW OF SYSTEMS: ROS    Positive for: Skin, Musculoskeletal, HENT, Eyes, Respiratory    Negative for: Constitutional, Gastrointestinal, Neurological, Genitourinary, Endocrine, Cardiovascular, Psychiatric, Allergic/Imm, Heme/Lymph   Last edited by Matthew Folks, COA on 09/18/2020  9:00 AM. (History)       ALLERGIES Allergies  Allergen Reactions  . Codeine Nausea Only  . Estrogens Conjugated Other (See Comments)    HA  . Paroxetine Hcl Other (See Comments)    Felt like she was "out of her head"    PAST MEDICAL HISTORY Past Medical History:  Diagnosis Date  . Allergy   . Anxiety    panic attack  . Depression   . GERD (gastroesophageal reflux disease)   . Glaucoma   . Glaucoma   . Hearing loss   . 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 Hyperlipidemia   . Hypertensive retinopathy    OU  . Migraines    ocular  . OSA on CPAP 02/02/2014   PSG 09-27-2012 RDI 18.9 and AHi 9.8, Epworth 15 .titrated to only 5 cm water.   . Osteoporosis   . Sleep apnea   . Tinnitus    Past Surgical History:  Procedure Laterality Date  . AIR/FLUID EXCHANGE Right 09/12/2020   Procedure: AIR/FLUID EXCHANGE;  Surgeon: Bernarda Caffey, MD;  Location: [place];  Service: Ophthalmology;  Laterality: Right;  . CATARACT EXTRACTION Bilateral OD: 10.25.21 OS: 12.2015   Dr. Venetia Maxon  . cataract surgery    . COSMETIC SURGERY    . EYE SURGERY Bilateral    Cat Sx - Dr. Venetia Maxon  . EYE SURGERY Right 09/12/2020   PPV/EL for retained lens material - Dr. Bernarda Caffey  . PARS PLANA VITRECTOMY Right 09/12/2020   Procedure: PARS PLANA VITRECTOMY WITH 23 GAUGE;  Surgeon: Bernarda Caffey, MD;  Location: [place];  Service: Ophthalmology;  Laterality: Right;  . PHOTOCOAGULATION WITH LASER Right 09/12/2020   Procedure: PHOTOCOAGULATION WITH LASER;  Surgeon: Bernarda Caffey, MD;  Location: [facility];  Service: Ophthalmology;  Laterality: Right;  . REMOVAL RETAINED LENS Right 09/12/2020   Procedure: REMOVAL RETAINED LENS;  Surgeon: Bernarda Caffey, MD;  Location: [place];  Service: Ophthalmology;   Laterality: Right;    FAMILY HISTORY Family History  Problem Relation Age of Onset  . Osteoporosis Mother   . Stroke Mother   . Hyperlipidemia Mother   . Hypertension Mother   . Kidney disease Father   . Congestive Heart Failure Father   . Heart disease Father   . Hyperlipidemia Father   . Stroke Sister   . Parkinson's disease Sister   . Heart disease Sister   . Heart disease Maternal Grandmother   . Gout Maternal Grandmother   . Stroke Maternal Grandfather   . Stroke Paternal Grandmother   . Thrombosis Paternal Grandfather   . Early death Paternal Grandfather   . Hypertension Brother   . Diabetes Brother   .  Arthritis/Rheumatoid Other   . Parkinson's disease Other   . Heart disease Other   . Obesity Daughter   . Breast cancer Neg Hx     SOCIAL HISTORY Social History   Tobacco Use  . Smoking status: Former Research scientist (life sciences)  . Smokeless tobacco: Never Used  . Tobacco comment: Quit in 1987  Vaping Use  . Vaping Use: Never used  Substance Use Topics  . Alcohol use: Yes    Alcohol/week: 12.0 standard drinks    Types: 12 Glasses of wine per week    Comment: wine or alcohol daily  . Drug use: No         OPHTHALMIC EXAM:  Base Eye Exam    Visual Acuity (Snellen - Linear)      Right Left   Dist [place] 20/80 -2 20/40 -2   Dist ph [place] 20/25 20/25       Tonometry (Tonopen, 9:04 AM)      Right Left   Pressure 19 Def       Pupils      Dark Light Shape React APD   Right 3 3 Round Minimal None   Left 2 1 Round Minimal None       Visual Fields (Counting fingers)      Left Right    Full Full       Extraocular Movement      Right Left    Full, Ortho Full, Ortho       Neuro/Psych    Oriented x3: Yes   Mood/Affect: Normal       Dilation    Right eye: 1.0% Mydriacyl, 2.5% Phenylephrine @ 9:04 AM        Slit Lamp and Fundus Exam    Slit Lamp Exam      Right Left   Lids/Lashes Dermatochalasis - upper lid Dermatochalasis - upper lid   Conjunctiva/Sclera  sutures intact, 2+ Injection temporally White and quiet   Cornea 2-3+ fine Punctate epithelial erosions, well healed temporal cataract wound, mild arcus 1+Punctate epithelial erosions, well healed temporal cataract wounds   Anterior Chamber Deep, 1-2+ Cell/pigment Deep and quiet, Deep   Iris Round and dilated, mild iridodenesis Round and dilated   Lens 3 piece sulcus IOL in good position, open PC, cortical remnants temporal hemisphere PC IOL in good position with trace PCO non-central   Vitreous post vitrectomy, 1+cell/pigment, air bubble gone; retained lens fragments gone syneresis       Fundus Exam      Right Left   Disc mild tilt, mild pallor, +cupping, mild PPP    C/D Ratio 0.6 0.5   Macula Flat, Blunted foveal reflex, mild RPE mottling, No heme or edema    Vessels attenuated, Tortuous    Periphery attached, retained lens material gone; 360 peripheral laser changes           IMAGING AND PROCEDURES  Imaging and Procedures for 09/18/2020  OCT, Retina - OU - Both Eyes       Right Eye Quality was good. Central Foveal Thickness: 294. Progression has improved. Findings include normal foveal contour, no IRF, no SRF (Interval improvement in vitreous opacities).   Left Eye Quality was good. Central Foveal Thickness: 284. Progression has been stable. Findings include normal foveal contour, no IRF, no SRF.   Notes *Images captured and stored on drive  Diagnosis / Impression:  NFP, no IRF/SRF OU -OD: Interval improvement in vitreous opacities  Clinical management:  See below  Abbreviations: NFP - Normal foveal  profile. CME - cystoid macular edema. PED - pigment epithelial detachment. IRF - intraretinal fluid. SRF - subretinal fluid. EZ - ellipsoid zone. ERM - epiretinal membrane. ORA - outer retinal atrophy. ORT - outer retinal tubulation. SRHM - subretinal hyper-reflective material. IRHM - intraretinal hyper-reflective material                ASSESSMENT/PLAN:     ICD-10-CM   1. Retained lens material following cataract surgery of right eye  H59.021   2. Retinal edema  H35.81 OCT, Retina - OU - Both Eyes  3. Essential hypertension  I10   4. Hypertensive retinopathy of both eyes  H35.033   5. 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 Retained lens material OD  - s/p CE, sulcus IOL on Monday 10.25.21  - broken posterior capsule, nuclear and cortical lens fragments in vitreous  - had IOP spike to 40 on 11.3.21  - improved post drops, AC tap and diamox by Dr. Venetia Maxon  - persistently elevated IOP and microcystic corneal edema  - now POD6 s/p PPV/EL OD 11.11.21             - doing well  - VA ph 20/25 OD             - IOP good at 19             - cont   PF 6x/day OD -- 6x/day                         zymaxid QID OD -- stop on 2023-10-04   Brimonidine BID OD                         Atropine BID OD -- okay to stop                         PSO ung QID OD -- decease to QHS/PRN on 10-04-23  - start ATs QID OD              - eye shield when sleeping              - post op drop and positioning instructions reviewed             - tylenol/ibuprofen for pain  - f/u 2-3 weeks, POV, DFE, OCT  2. No retinal edema on exam or OCT  3,4. Hypertensive retinopathy OU - discussed importance of tight BP control - monitor  5. Pseudophakia OU  - s/p CE/IOL (Dr. Venetia Maxon, OD: 10.25.21, OS: 12.2015)  - IOLs in good position  - retained lens material OD as above - monitor  Ophthalmic Meds Ordered this visit:  No orders of the defined types were placed in this encounter.      Return for f/u 2-3 weeks retained lens material OD, POV, DFE.  There are no Patient Instructions on file for this visit.   Explained the diagnoses, plan, and follow up with the patient and they expressed understanding.  Patient expressed understanding of the importance of proper follow up care.   This document serves as a record of services  personally performed by Gardiner Sleeper, MD, PhD. It was created on their behalf by Roselee Nova, COMT. The creation of this record is the provider's dictation and/or activities during the visit.  Electronically signed  by: Roselee Nova, COMT 09/18/20 1:35 PM   This document serves as a record of services personally performed by Gardiner Sleeper, MD, PhD. It was created on their behalf by San Jetty. Owens Shark, OA an ophthalmic technician. The creation of this record is the provider's dictation and/or activities during the visit.    Electronically signed by: San Jetty. Owens Shark, [place] 11.17.2021 1:35 PM  Gardiner Sleeper, M.D., Ph.D. Diseases & Surgery of the Retina and Vitreous [facility]  I have reviewed the above documentation for accuracy and completeness, and I agree with the above. Gardiner Sleeper, M.D., Ph.D. 09/18/20 1:35 PM   Abbreviations: M myopia (nearsighted); A astigmatism; H hyperopia (farsighted); P presbyopia; Mrx spectacle prescription;  CTL contact lenses; OD right eye; OS left eye; OU both eyes  XT exotropia; ET esotropia; PEK punctate epithelial keratitis; PEE punctate epithelial erosions; DES dry eye syndrome; MGD meibomian gland dysfunction; ATs artificial tears; PFAT's preservative free artificial tears; [place] nuclear sclerotic cataract; PSC posterior subcapsular cataract; ERM epi-retinal membrane; PVD posterior vitreous detachment; RD retinal detachment; DM diabetes mellitus; DR diabetic retinopathy; NPDR non-proliferative diabetic retinopathy; PDR proliferative diabetic retinopathy; CSME clinically significant macular edema; DME diabetic macular edema; dbh dot blot hemorrhages; CWS cotton wool spot; POAG primary open angle glaucoma; C/D cup-to-disc ratio; HVF humphrey visual field; GVF goldmann visual field; OCT optical coherence tomography; IOP intraocular pressure; BRVO Branch retinal vein occlusion; CRVO central retinal vein occlusion; CRAO central retinal artery  occlusion; BRAO branch retinal artery occlusion; RT retinal tear; SB scleral buckle; PPV pars plana vitrectomy; VH Vitreous hemorrhage; PRP panretinal laser photocoagulation; IVK intravitreal kenalog; VMT vitreomacular traction; MH Macular hole;  NVD neovascularization of the disc; NVE neovascularization elsewhere; AREDS age related eye disease study; ARMD age related macular degeneration; POAG primary open angle glaucoma; EBMD epithelial/anterior basement membrane dystrophy; ACIOL anterior chamber intraocular lens; IOL intraocular lens; PCIOL posterior chamber intraocular lens; Phaco/IOL phacoemulsification with intraocular lens placement; [place] photorefractive keratectomy; LASIK laser assisted in situ keratomileusis; HTN hypertension; DM diabetes mellitus; COPD chronic obstructive pulmonary disease

## 2020-09-18 ENCOUNTER — Other Ambulatory Visit: Payer: Self-pay

## 2020-09-18 ENCOUNTER — Encounter (INDEPENDENT_AMBULATORY_CARE_PROVIDER_SITE_OTHER): Payer: Self-pay | Admitting: Ophthalmology

## 2020-09-18 ENCOUNTER — Ambulatory Visit (INDEPENDENT_AMBULATORY_CARE_PROVIDER_SITE_OTHER): Payer: Medicare HMO | Admitting: Ophthalmology

## 2020-09-18 DIAGNOSIS — I10 Essential (primary) hypertension: Secondary | ICD-10-CM

## 2020-09-18 DIAGNOSIS — H3581 Retinal edema: Secondary | ICD-10-CM | POA: Diagnosis not present

## 2020-09-18 DIAGNOSIS — H35033 Hypertensive retinopathy, bilateral: Secondary | ICD-10-CM

## 2020-09-18 DIAGNOSIS — Z961 Presence of intraocular lens: Secondary | ICD-10-CM

## 2020-09-18 DIAGNOSIS — H40051 Ocular hypertension, right eye: Secondary | ICD-10-CM

## 2020-09-18 DIAGNOSIS — H59021 Cataract (lens) fragments in eye following cataract surgery, right eye: Secondary | ICD-10-CM

## 2020-09-20 DIAGNOSIS — M62830 Muscle spasm of back: Secondary | ICD-10-CM | POA: Diagnosis not present

## 2020-09-20 DIAGNOSIS — M5032 Other cervical disc degeneration, mid-cervical region, unspecified level: Secondary | ICD-10-CM | POA: Diagnosis not present

## 2020-09-20 DIAGNOSIS — M9903 Segmental and somatic dysfunction of lumbar region: Secondary | ICD-10-CM | POA: Diagnosis not present

## 2020-09-20 DIAGNOSIS — M9901 Segmental and somatic dysfunction of cervical region: Secondary | ICD-10-CM | POA: Diagnosis not present

## 2020-09-23 DIAGNOSIS — M9903 Segmental and somatic dysfunction of lumbar region: Secondary | ICD-10-CM | POA: Diagnosis not present

## 2020-09-23 DIAGNOSIS — M62830 Muscle spasm of back: Secondary | ICD-10-CM | POA: Diagnosis not present

## 2020-09-23 DIAGNOSIS — M5032 Other cervical disc degeneration, mid-cervical region, unspecified level: Secondary | ICD-10-CM | POA: Diagnosis not present

## 2020-09-23 DIAGNOSIS — M9901 Segmental and somatic dysfunction of cervical region: Secondary | ICD-10-CM | POA: Diagnosis not present

## 2020-09-24 DIAGNOSIS — M62830 Muscle spasm of back: Secondary | ICD-10-CM | POA: Diagnosis not present

## 2020-09-24 DIAGNOSIS — M9901 Segmental and somatic dysfunction of cervical region: Secondary | ICD-10-CM | POA: Diagnosis not present

## 2020-09-24 DIAGNOSIS — M9903 Segmental and somatic dysfunction of lumbar region: Secondary | ICD-10-CM | POA: Diagnosis not present

## 2020-09-24 DIAGNOSIS — M5032 Other cervical disc degeneration, mid-cervical region, unspecified level: Secondary | ICD-10-CM | POA: Diagnosis not present

## 2020-09-25 DIAGNOSIS — M9903 Segmental and somatic dysfunction of lumbar region: Secondary | ICD-10-CM | POA: Diagnosis not present

## 2020-09-25 DIAGNOSIS — M5032 Other cervical disc degeneration, mid-cervical region, unspecified level: Secondary | ICD-10-CM | POA: Diagnosis not present

## 2020-09-25 DIAGNOSIS — M9901 Segmental and somatic dysfunction of cervical region: Secondary | ICD-10-CM | POA: Diagnosis not present

## 2020-09-25 DIAGNOSIS — M62830 Muscle spasm of back: Secondary | ICD-10-CM | POA: Diagnosis not present

## 2020-09-28 NOTE — Progress Notes (Signed)
[facility] Note  10/02/2020     CHIEF COMPLAINT Patient presents for Post-op Follow-up   HISTORY OF PRESENT ILLNESS: Nicole Holt is a 79 y.o. female who presents to the clinic today for:   HPI    Post-op Follow-up    In right eye.  Discomfort includes floaters.  Vision is improved, is blurred at distance and is blurred at near.  I, the attending physician,  performed the HPI with the patient and updated documentation appropriately.          Comments    79 y/o female pt here for 2 wk POV OD s/p PPV/EL OD 11.11.21.  VA OD improved, but sees a constant "kite-like" floater superiorly OD.  No change in [place] OS.  Denies pain, FOL.  Pred 6x/day OD, Brimonidine BID OD, PSO ung prn OD, AT QID OD.       Last edited by Bernarda Caffey, MD on 10/02/2020  9:54 AM. (History)    Pt states her vision is doing well, she states the floaters keep changing, she saw one this morning that looked like a kite with a tail on it, she says for the past couple of days she has not had to get up early and start her drops bc her eyes had not been "stinging"  Referring physician: Vivi Barrack, MD [place],  [place] [id number]  HISTORICAL INFORMATION:   Selected notes from the MEDICAL RECORD NUMBER Referred by Dr. Venetia Maxon for retained lens material OD LEE:  Ocular Hx- PMH-    CURRENT MEDICATIONS: Current Outpatient Medications (Ophthalmic Drugs)  Medication Sig  . brimonidine (ALPHAGAN) 0.15 % ophthalmic solution Place 1 drop into the right eye 3 (three) times daily.  . prednisoLONE acetate (PRED FORTE) 1 % ophthalmic suspension Place 1 drop into the right eye 6 (six) times daily.   . Bromfenac Sodium (PROLENSA) 0.07 % SOLN Place 1 drop into the right eye in the morning, at noon, in the evening, and at bedtime.  (Patient not taking: Reported on 10/02/2020)  . dorzolamide-timolol (COSOPT) 22.3-6.8 MG/ML ophthalmic solution Place 1 drop into the right eye in the  morning, at noon, and at bedtime.  (Patient not taking: Reported on 10/02/2020)  . ofloxacin (OCUFLOX) 0.3 % ophthalmic solution Place 1 drop into the right eye 5 (five) times daily.  (Patient not taking: Reported on 10/02/2020)  . Travoprost, BAK Free, (TRAVATAN) 0.004 % SOLN ophthalmic solution Place 1 drop into both eyes at bedtime. (Patient not taking: Reported on 10/02/2020)   No current facility-administered medications for this visit. (Ophthalmic Drugs)   Current Outpatient Medications (Other)  Medication Sig  . calcium carbonate (TUMS - DOSED IN MG ELEMENTAL CALCIUM) 500 MG chewable tablet Chew 1-2 tablets by mouth 3 (three) times daily as needed for indigestion or heartburn.  . Calcium Carbonate-Vit D-Min (QC CALCIUM-MAGNESIUM-ZINC-D3 PO) Take 1 tablet by mouth once a week.  . diazepam (VALIUM) 5 MG tablet Take 1 tablet (5 mg total) by mouth every 6 (six) hours as needed. for anxiety (Patient taking differently: Take 5 mg by mouth every 6 (six) hours as needed (panic attacks.). )  . fluticasone (FLONASE) 50 MCG/ACT nasal spray Use 2 spray(s) in each nostril once daily (Patient taking differently: Place 2 sprays into both nostrils daily as needed for allergies. )  . gabapentin (NEURONTIN) 100 MG capsule Take 1-3 capsules (100-300 mg total) by mouth 3 (three) times daily as needed (nerve pain).  Marland Kitchen  melatonin 3 MG TABS tablet Take 3 mg by mouth at bedtime as needed (sleep).  . Omega-3 Fatty Acids (FISH OIL PO) Take 1 capsule by mouth every 14 (fourteen) days.   . polyethylene glycol (MIRALAX / GLYCOLAX) 17 g packet Take 17 g by mouth daily.  . Potassium 99 MG TABS Take 99 mg by mouth 2 (two) times a week.  . predniSONE (DELTASONE) 50 MG tablet Take 1 tablet (50 mg total) by mouth daily.  . Turmeric (QC TUMERIC COMPLEX PO) Take 1 capsule by mouth daily.   Marland Kitchen acetaZOLAMIDE (DIAMOX SEQUELS) 500 MG capsule Take 1 capsule (500 mg total) by mouth 2 (two) times daily. (Patient not taking: Reported on  10/02/2020)   No current facility-administered medications for this visit. (Other)      REVIEW OF SYSTEMS: ROS    Positive for: Neurological, Musculoskeletal, HENT, Eyes, Respiratory   Negative for: Constitutional, Gastrointestinal, Skin, Genitourinary, Endocrine, Cardiovascular, Psychiatric, Allergic/Imm, Heme/Lymph   Last edited by Matthew Folks, COA on 10/02/2020  9:00 AM. (History)       ALLERGIES Allergies  Allergen Reactions  . Codeine Nausea Only  . Estrogens Conjugated Other (See Comments)    HA  . Paroxetine Hcl Other (See Comments)    Felt like she was "out of her head"    PAST MEDICAL HISTORY Past Medical History:  Diagnosis Date  . Allergy   . Anxiety    panic attack  . Depression   . GERD (gastroesophageal reflux disease)   . Glaucoma   . Glaucoma   . Hearing loss   . 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 Hyperlipidemia   . Hypertensive retinopathy    OU  . Migraines    ocular  . OSA on CPAP 02/02/2014   PSG 09-27-2012 RDI 18.9 and AHi 9.8, Epworth 15 .titrated to only 5 cm water.   . Osteoporosis   . Sleep apnea   . Tinnitus    Past Surgical History:  Procedure Laterality Date  . AIR/FLUID EXCHANGE Right 09/12/2020   Procedure: AIR/FLUID EXCHANGE;  Surgeon: Bernarda Caffey, MD;  Location: [facility];  Service: Ophthalmology;  Laterality: Right;  . CATARACT EXTRACTION Bilateral OD: 10.25.21 OS: 12.2015   Dr. Venetia Maxon  . cataract surgery    . COSMETIC SURGERY    . EYE SURGERY Bilateral    Cat Sx - Dr. Venetia Maxon  . EYE SURGERY Right 09/12/2020   PPV/EL for retained lens material - Dr. Bernarda Caffey  . PARS PLANA VITRECTOMY Right 09/12/2020   Procedure: PARS PLANA VITRECTOMY WITH 23 GAUGE;  Surgeon: Bernarda Caffey, MD;  Location: [facility];  Service: Ophthalmology;  Laterality: Right;  . PHOTOCOAGULATION WITH LASER Right 09/12/2020   Procedure: PHOTOCOAGULATION WITH LASER;  Surgeon: Bernarda Caffey, MD;  Location: [facility];  Service:  Ophthalmology;  Laterality: Right;  . REMOVAL RETAINED LENS Right 09/12/2020   Procedure: REMOVAL RETAINED LENS;  Surgeon: Bernarda Caffey, MD;  Location: [facility];  Service: Ophthalmology;  Laterality: Right;    FAMILY HISTORY Family History  Problem Relation Age of Onset  . Osteoporosis Mother   . Stroke Mother   . Hyperlipidemia Mother   . Hypertension Mother   . Kidney disease Father   . Congestive Heart Failure Father   . Heart disease Father   . Hyperlipidemia Father   . Stroke Sister   . Parkinson's disease Sister   . Heart disease Sister   . Heart disease Maternal Grandmother   .  Gout Maternal Grandmother   . Stroke Maternal Grandfather   . Stroke Paternal Grandmother   . Thrombosis Paternal Grandfather   . Early death Paternal Grandfather   . Hypertension Brother   . Diabetes Brother   . Arthritis/Rheumatoid Other   . Parkinson's disease Other   . Heart disease Other   . Obesity Daughter   . Breast cancer Neg Hx     SOCIAL HISTORY Social History   Tobacco Use  . Smoking status: Former Research scientist (life sciences)  . Smokeless tobacco: Never Used  . Tobacco comment: Quit in 1987  Vaping Use  . Vaping Use: Never used  Substance Use Topics  . Alcohol use: Yes    Alcohol/week: 12.0 standard drinks    Types: 12 Glasses of wine per week    Comment: wine or alcohol daily  . Drug use: No         OPHTHALMIC EXAM:  Base Eye Exam    Visual Acuity (Snellen - Linear)      Right Left   Dist [place] 20/40 -2 20/50 -2   Dist ph [place] NI 20/25 -       Tonometry (Tonopen, 9:03 AM)      Right Left   Pressure 18 17       Pupils      Dark Light Shape React APD   Right 6 6 Round Minimal None   Left 4 3 Round Minimal None       Visual Fields (Counting fingers)      Left Right    Full Full       Extraocular Movement      Right Left    Full, Ortho Full, Ortho       Neuro/Psych    Oriented x3: Yes   Mood/Affect: Normal       Dilation    Right eye: 1.0% Mydriacyl, 2.5%  Phenylephrine @ 9:03 AM        Slit Lamp and Fundus Exam    Slit Lamp Exam      Right Left   Lids/Lashes Dermatochalasis - upper lid Dermatochalasis - upper lid   Conjunctiva/Sclera sutures intact White and quiet   Cornea 2-3+Punctate epithelial erosions, well healed temporal cataract wound, mild arcus 1+Punctate epithelial erosions, well healed temporal cataract wounds   Anterior Chamber Deep, 0.5+ Cell/pigment Deep and quiet, Deep   Iris Round and dilated, mild iridodenesis Round and dilated   Lens 3 piece sulcus IOL in good position, open PC, cortical remnants temporal hemisphere disintegrating PC IOL in good position with trace PCO non-central   Vitreous post vitrectomy, small cortical remnant fragment settled inferiorly syneresis       Fundus Exam      Right Left   Disc mild tilt, mild pallor, +cupping, mild PPP    C/D Ratio 0.6 0.5   Macula Flat, Blunted foveal reflex, mild RPE mottling, No heme or edema    Vessels attenuated, Tortuous    Periphery attached, retained lens material gone; 360 peripheral laser changes           IMAGING AND PROCEDURES  Imaging and Procedures for 10/02/2020  OCT, Retina - OU - Both Eyes       Right Eye Quality was good. Central Foveal Thickness: 288. Progression has improved. Findings include normal foveal contour, no IRF, no SRF (Interval improvement in vitreous opacities).   Left Eye Quality was good. Central Foveal Thickness: 284. Progression has been stable. Findings include normal foveal contour, no IRF, no SRF.  Notes *Images captured and stored on drive  Diagnosis / Impression:  NFP, no IRF/SRF OU -OD: Interval improvement in vitreous opacities  Clinical management:  See below  Abbreviations: NFP - Normal foveal profile. CME - cystoid macular edema. PED - pigment epithelial detachment. IRF - intraretinal fluid. SRF - subretinal fluid. EZ - ellipsoid zone. ERM - epiretinal membrane. ORA - outer retinal atrophy. ORT - outer  retinal tubulation. SRHM - subretinal hyper-reflective material. IRHM - intraretinal hyper-reflective material                ASSESSMENT/PLAN:    ICD-10-CM   1. Retained lens material following cataract surgery of right eye  H59.021   2. Retinal edema  H35.81 OCT, Retina - OU - Both Eyes  3. Essential hypertension  I10   4. Hypertensive retinopathy of both eyes  H35.033   5. Pseudophakia, both eyes  Z96.1     1. Retained lens material OD  - s/p CE, sulcus IOL on Monday 10.25.21  - broken posterior capsule, nuclear and cortical lens fragments in vitreous  - had IOP spike to 40 on 11.3.21  - improved post drops, AC tap and diamox by Dr. Venetia Maxon  - persistently elevated IOP and microcystic corneal edema  - s/p PPV/EL OD 11.11.21             - doing well  - VA 20/40 OD -- significant PEE             - IOP good at 18  - cortical remnants in the bag disintegrating and small piece in vit -- should continue to disintegrate and resolve             - cont   PF 6x/day OD    Brimonidine BID OD                         PSO ung QHS/PRN OD -- replace with OTC gel  - start ATs QID OD             - post op drop and positioning instructions reviewed             - tylenol/ibuprofen for pain  - f/u 4 weeks, POV, DFE, OCT  2. No retinal edema on exam or OCT  3,4. Hypertensive retinopathy OU - discussed importance of tight BP control - monitor  5. Pseudophakia OU  - s/p CE/IOL (Dr. Venetia Maxon, OD: 10.25.21, OS: 12.2015)  - IOLs in good position  - retained lens material OD as above - monitor  Ophthalmic Meds Ordered this visit:  No orders of the defined types were placed in this encounter.      Return in about 4 weeks (around 10/30/2020) for f/u retained lens material OD, DFE, OCT.  There are no Patient Instructions on file for this visit.   Explained the diagnoses, plan, and follow up with the patient and they expressed understanding.  Patient expressed understanding of the  importance of proper follow up care.   This document serves as a record of services personally performed by Gardiner Sleeper, MD, PhD. It was created on their behalf by Roselee Nova, COMT. The creation of this record is the provider's dictation and/or activities during the visit.  Electronically signed by: Roselee Nova, COMT 10/05/20 12:05 AM   This document serves as a record of services personally performed by Gardiner Sleeper, MD, PhD. It was created on their behalf by San Jetty. Owens Shark, OA  an ophthalmic technician. The creation of this record is the provider's dictation and/or activities during the visit.    Electronically signed by: San Jetty. Owens Shark, [place] 12.01.2021 12:05 AM  Gardiner Sleeper, M.D., Ph.D. Diseases & Surgery of the Retina and Vitreous [place]  I have reviewed the above documentation for accuracy and completeness, and I agree with the above. Gardiner Sleeper, M.D., Ph.D. 10/05/20 12:05 AM   Abbreviations: M myopia (nearsighted); A astigmatism; H hyperopia (farsighted); P presbyopia; Mrx spectacle prescription;  CTL contact lenses; OD right eye; OS left eye; OU both eyes  XT exotropia; ET esotropia; PEK punctate epithelial keratitis; PEE punctate epithelial erosions; DES dry eye syndrome; MGD meibomian gland dysfunction; ATs artificial tears; PFAT's preservative free artificial tears; [place] nuclear sclerotic cataract; PSC posterior subcapsular cataract; ERM epi-retinal membrane; PVD posterior vitreous detachment; RD retinal detachment; DM diabetes mellitus; DR diabetic retinopathy; NPDR non-proliferative diabetic retinopathy; PDR proliferative diabetic retinopathy; CSME clinically significant macular edema; DME diabetic macular edema; dbh dot blot hemorrhages; CWS cotton wool spot; POAG primary open angle glaucoma; C/D cup-to-disc ratio; HVF humphrey visual field; GVF goldmann visual field; OCT optical coherence tomography; IOP intraocular pressure; BRVO Branch  retinal vein occlusion; CRVO central retinal vein occlusion; CRAO central retinal artery occlusion; BRAO branch retinal artery occlusion; RT retinal tear; SB scleral buckle; PPV pars plana vitrectomy; VH Vitreous hemorrhage; PRP panretinal laser photocoagulation; IVK intravitreal kenalog; VMT vitreomacular traction; MH Macular hole;  NVD neovascularization of the disc; NVE neovascularization elsewhere; AREDS age related eye disease study; ARMD age related macular degeneration; POAG primary open angle glaucoma; EBMD epithelial/anterior basement membrane dystrophy; ACIOL anterior chamber intraocular lens; IOL intraocular lens; PCIOL posterior chamber intraocular lens; Phaco/IOL phacoemulsification with intraocular lens placement; [place] photorefractive keratectomy; LASIK laser assisted in situ keratomileusis; HTN hypertension; DM diabetes mellitus; COPD chronic obstructive pulmonary disease

## 2020-09-30 DIAGNOSIS — M5032 Other cervical disc degeneration, mid-cervical region, unspecified level: Secondary | ICD-10-CM | POA: Diagnosis not present

## 2020-09-30 DIAGNOSIS — M9903 Segmental and somatic dysfunction of lumbar region: Secondary | ICD-10-CM | POA: Diagnosis not present

## 2020-09-30 DIAGNOSIS — M9901 Segmental and somatic dysfunction of cervical region: Secondary | ICD-10-CM | POA: Diagnosis not present

## 2020-09-30 DIAGNOSIS — M62830 Muscle spasm of back: Secondary | ICD-10-CM | POA: Diagnosis not present

## 2020-10-02 ENCOUNTER — Ambulatory Visit (INDEPENDENT_AMBULATORY_CARE_PROVIDER_SITE_OTHER): Payer: Medicare HMO | Admitting: Ophthalmology

## 2020-10-02 ENCOUNTER — Encounter (INDEPENDENT_AMBULATORY_CARE_PROVIDER_SITE_OTHER): Payer: Self-pay | Admitting: Ophthalmology

## 2020-10-02 ENCOUNTER — Other Ambulatory Visit: Payer: Self-pay

## 2020-10-02 DIAGNOSIS — H3581 Retinal edema: Secondary | ICD-10-CM

## 2020-10-02 DIAGNOSIS — M62830 Muscle spasm of back: Secondary | ICD-10-CM | POA: Diagnosis not present

## 2020-10-02 DIAGNOSIS — H35033 Hypertensive retinopathy, bilateral: Secondary | ICD-10-CM

## 2020-10-02 DIAGNOSIS — Z961 Presence of intraocular lens: Secondary | ICD-10-CM

## 2020-10-02 DIAGNOSIS — H59021 Cataract (lens) fragments in eye following cataract surgery, right eye: Secondary | ICD-10-CM

## 2020-10-02 DIAGNOSIS — M9903 Segmental and somatic dysfunction of lumbar region: Secondary | ICD-10-CM | POA: Diagnosis not present

## 2020-10-02 DIAGNOSIS — I10 Essential (primary) hypertension: Secondary | ICD-10-CM

## 2020-10-02 DIAGNOSIS — M5032 Other cervical disc degeneration, mid-cervical region, unspecified level: Secondary | ICD-10-CM | POA: Diagnosis not present

## 2020-10-02 DIAGNOSIS — M9901 Segmental and somatic dysfunction of cervical region: Secondary | ICD-10-CM | POA: Diagnosis not present

## 2020-10-05 ENCOUNTER — Encounter (INDEPENDENT_AMBULATORY_CARE_PROVIDER_SITE_OTHER): Payer: Self-pay | Admitting: Ophthalmology

## 2020-10-07 DIAGNOSIS — M5032 Other cervical disc degeneration, mid-cervical region, unspecified level: Secondary | ICD-10-CM | POA: Diagnosis not present

## 2020-10-07 DIAGNOSIS — M9903 Segmental and somatic dysfunction of lumbar region: Secondary | ICD-10-CM | POA: Diagnosis not present

## 2020-10-07 DIAGNOSIS — M9901 Segmental and somatic dysfunction of cervical region: Secondary | ICD-10-CM | POA: Diagnosis not present

## 2020-10-07 DIAGNOSIS — M62830 Muscle spasm of back: Secondary | ICD-10-CM | POA: Diagnosis not present

## 2020-10-10 DIAGNOSIS — M9903 Segmental and somatic dysfunction of lumbar region: Secondary | ICD-10-CM | POA: Diagnosis not present

## 2020-10-10 DIAGNOSIS — M62830 Muscle spasm of back: Secondary | ICD-10-CM | POA: Diagnosis not present

## 2020-10-10 DIAGNOSIS — M9901 Segmental and somatic dysfunction of cervical region: Secondary | ICD-10-CM | POA: Diagnosis not present

## 2020-10-10 DIAGNOSIS — M5032 Other cervical disc degeneration, mid-cervical region, unspecified level: Secondary | ICD-10-CM | POA: Diagnosis not present

## 2020-10-12 DIAGNOSIS — G4733 Obstructive sleep apnea (adult) (pediatric): Secondary | ICD-10-CM | POA: Diagnosis not present

## 2020-10-15 DIAGNOSIS — M62830 Muscle spasm of back: Secondary | ICD-10-CM | POA: Diagnosis not present

## 2020-10-15 DIAGNOSIS — M9903 Segmental and somatic dysfunction of lumbar region: Secondary | ICD-10-CM | POA: Diagnosis not present

## 2020-10-15 DIAGNOSIS — M9901 Segmental and somatic dysfunction of cervical region: Secondary | ICD-10-CM | POA: Diagnosis not present

## 2020-10-15 DIAGNOSIS — M5032 Other cervical disc degeneration, mid-cervical region, unspecified level: Secondary | ICD-10-CM | POA: Diagnosis not present

## 2020-10-17 DIAGNOSIS — M9901 Segmental and somatic dysfunction of cervical region: Secondary | ICD-10-CM | POA: Diagnosis not present

## 2020-10-17 DIAGNOSIS — M62830 Muscle spasm of back: Secondary | ICD-10-CM | POA: Diagnosis not present

## 2020-10-17 DIAGNOSIS — M5032 Other cervical disc degeneration, mid-cervical region, unspecified level: Secondary | ICD-10-CM | POA: Diagnosis not present

## 2020-10-17 DIAGNOSIS — M9903 Segmental and somatic dysfunction of lumbar region: Secondary | ICD-10-CM | POA: Diagnosis not present

## 2020-10-22 DIAGNOSIS — M5032 Other cervical disc degeneration, mid-cervical region, unspecified level: Secondary | ICD-10-CM | POA: Diagnosis not present

## 2020-10-22 DIAGNOSIS — M9901 Segmental and somatic dysfunction of cervical region: Secondary | ICD-10-CM | POA: Diagnosis not present

## 2020-10-22 DIAGNOSIS — M62830 Muscle spasm of back: Secondary | ICD-10-CM | POA: Diagnosis not present

## 2020-10-22 DIAGNOSIS — M9903 Segmental and somatic dysfunction of lumbar region: Secondary | ICD-10-CM | POA: Diagnosis not present

## 2020-10-23 ENCOUNTER — Other Ambulatory Visit (INDEPENDENT_AMBULATORY_CARE_PROVIDER_SITE_OTHER): Payer: Self-pay

## 2020-10-23 MED ORDER — PREDNISOLONE ACETATE 1 % OP SUSP: 1.0000 [drp] | Freq: Every day | OPHTHALMIC | 0 refills | Status: AC

## 2020-10-24 DIAGNOSIS — M9903 Segmental and somatic dysfunction of lumbar region: Secondary | ICD-10-CM | POA: Diagnosis not present

## 2020-10-24 DIAGNOSIS — M9901 Segmental and somatic dysfunction of cervical region: Secondary | ICD-10-CM | POA: Diagnosis not present

## 2020-10-24 DIAGNOSIS — M5032 Other cervical disc degeneration, mid-cervical region, unspecified level: Secondary | ICD-10-CM | POA: Diagnosis not present

## 2020-10-24 DIAGNOSIS — M62830 Muscle spasm of back: Secondary | ICD-10-CM | POA: Diagnosis not present

## 2020-10-29 DIAGNOSIS — M5032 Other cervical disc degeneration, mid-cervical region, unspecified level: Secondary | ICD-10-CM | POA: Diagnosis not present

## 2020-10-29 DIAGNOSIS — M62830 Muscle spasm of back: Secondary | ICD-10-CM | POA: Diagnosis not present

## 2020-10-29 DIAGNOSIS — M9901 Segmental and somatic dysfunction of cervical region: Secondary | ICD-10-CM | POA: Diagnosis not present

## 2020-10-29 DIAGNOSIS — M9903 Segmental and somatic dysfunction of lumbar region: Secondary | ICD-10-CM | POA: Diagnosis not present

## 2020-10-30 ENCOUNTER — Other Ambulatory Visit: Payer: Self-pay

## 2020-10-30 ENCOUNTER — Ambulatory Visit (INDEPENDENT_AMBULATORY_CARE_PROVIDER_SITE_OTHER): Payer: Medicare HMO | Admitting: Ophthalmology

## 2020-10-30 ENCOUNTER — Encounter (INDEPENDENT_AMBULATORY_CARE_PROVIDER_SITE_OTHER): Payer: Self-pay | Admitting: Ophthalmology

## 2020-10-30 DIAGNOSIS — H35033 Hypertensive retinopathy, bilateral: Secondary | ICD-10-CM

## 2020-10-30 DIAGNOSIS — H3581 Retinal edema: Secondary | ICD-10-CM

## 2020-10-30 DIAGNOSIS — I10 Essential (primary) hypertension: Secondary | ICD-10-CM

## 2020-10-30 DIAGNOSIS — H59021 Cataract (lens) fragments in eye following cataract surgery, right eye: Secondary | ICD-10-CM

## 2020-10-30 DIAGNOSIS — Z961 Presence of intraocular lens: Secondary | ICD-10-CM

## 2020-10-30 NOTE — Progress Notes (Signed)
[facility] - Clinic Note  10/30/2020     CHIEF COMPLAINT Patient presents for Post-op Follow-up (Patient states vision improved OD. No eye pain OD. Some floaters OD, no flashes. Using Pred Forte 6 times daily, brimonidine bid OD, OTC gel qhs/prn OD, and AT's (Thera tears) bid OD. Also using travoprost (travatan) qhs OU.)   HISTORY OF PRESENT ILLNESS: Nicole Holt is a 79 y.o. female who presents to the clinic today for:   HPI    Post-op Follow-up    In right eye.  Discomfort includes floaters.  Negative for pain, itching, foreign body sensation, tearing, discharge and none.  Vision is improved.  I, the attending physician,  performed the HPI with the patient and updated documentation appropriately. Additional comments: Patient states vision improved OD. No eye pain OD. Some floaters OD, no flashes. Using Pred Forte 6 times daily, brimonidine bid OD, OTC gel qhs/prn OD, and AT's (Thera tears) bid OD. Also using travoprost (travatan) qhs OU.       Last edited by Rennis Chris, MD on 10/30/2020  5:17 PM. (History)    Pt states she still sees intermittent floaters, but they are smaller and improving  Referring physician: Ardith Dark, MD [address] [place],  [place] [address]  HISTORICAL INFORMATION:   Selected notes from the MEDICAL RECORD NUMBER Referred by Dr. Harlon Flor for retained lens material OD LEE:  Ocular Hx- PMH-    CURRENT MEDICATIONS: Current Outpatient Medications (Ophthalmic Drugs)  Medication Sig  . brimonidine (ALPHAGAN) 0.15 % ophthalmic solution Place 1 drop into the right eye 2 (two) times daily.  . prednisoLONE acetate (PRED FORTE) 1 % ophthalmic suspension Place 1 drop into the right eye 6 (six) times daily.   . prednisoLONE acetate (PRED FORTE) 1 % ophthalmic suspension Place 1 drop into the right eye 6 (six) times daily.  . Travoprost, BAK Free, (TRAVATAN) 0.004 % SOLN ophthalmic solution Place 1 drop into both eyes at bedtime.  .  Bromfenac Sodium (PROLENSA) 0.07 % SOLN Place 1 drop into the right eye in the morning, at noon, in the evening, and at bedtime.  (Patient not taking: No sig reported)  . dorzolamide-timolol (COSOPT) 22.3-6.8 MG/ML ophthalmic solution Place 1 drop into the right eye in the morning, at noon, and at bedtime.  (Patient not taking: No sig reported)  . ofloxacin (OCUFLOX) 0.3 % ophthalmic solution Place 1 drop into the right eye 5 (five) times daily.  (Patient not taking: No sig reported)   No current facility-administered medications for this visit. (Ophthalmic Drugs)   Current Outpatient Medications (Other)  Medication Sig  . calcium carbonate (TUMS - DOSED IN MG ELEMENTAL CALCIUM) 500 MG chewable tablet Chew 1-2 tablets by mouth 3 (three) times daily as needed for indigestion or heartburn.  . Calcium Carbonate-Vit D-Min (QC CALCIUM-MAGNESIUM-ZINC-D3 PO) Take 1 tablet by mouth once a week.  . diazepam (VALIUM) 5 MG tablet Take 1 tablet (5 mg total) by mouth every 6 (six) hours as needed. for anxiety (Patient taking differently: Take 5 mg by mouth every 6 (six) hours as needed (panic attacks.).)  . fluticasone (FLONASE) 50 MCG/ACT nasal spray Use 2 spray(s) in each nostril once daily (Patient taking differently: Place 2 sprays into both nostrils daily as needed for allergies.)  . gabapentin (NEURONTIN) 100 MG capsule Take 1-3 capsules (100-300 mg total) by mouth 3 (three) times daily as needed (nerve pain).  . melatonin 3 MG TABS tablet Take 3 mg by  mouth at bedtime as needed (sleep).  . Omega-3 Fatty Acids (FISH OIL PO) Take 1 capsule by mouth every 14 (fourteen) days.   . polyethylene glycol (MIRALAX / GLYCOLAX) 17 g packet Take 17 g by mouth daily.  . Potassium 99 MG TABS Take 99 mg by mouth 2 (two) times a week.  . Turmeric (QC TUMERIC COMPLEX PO) Take 1 capsule by mouth daily.   Marland Kitchen acetaZOLAMIDE (DIAMOX SEQUELS) 500 MG capsule Take 1 capsule (500 mg total) by mouth 2 (two) times daily. (Patient not  taking: No sig reported)  . predniSONE (DELTASONE) 50 MG tablet Take 1 tablet (50 mg total) by mouth daily.   No current facility-administered medications for this visit. (Other)      REVIEW OF SYSTEMS: ROS    Positive for: Neurological, Musculoskeletal, HENT, Eyes, Respiratory   Negative for: Constitutional, Gastrointestinal, Skin, Genitourinary, Endocrine, Cardiovascular, Psychiatric, Allergic/Imm, Heme/Lymph   Last edited by Roselee Nova D, COT on 10/30/2020  1:20 PM. (History)       ALLERGIES Allergies  Allergen Reactions  . Codeine Nausea Only  . Estrogens Conjugated Other (See Comments)    HA  . Paroxetine Hcl Other (See Comments)    Felt like she was "out of her head"    PAST MEDICAL HISTORY Past Medical History:  Diagnosis Date  . Allergy   . Anxiety    panic attack  . Depression   . GERD (gastroesophageal reflux disease)   . Glaucoma   . Glaucoma   . Hearing loss   . 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 Hyperlipidemia   . Hypertensive retinopathy    OU  . Migraines    ocular  . OSA on CPAP 02/02/2014   PSG 09-27-2012 RDI 18.9 and AHi 9.8, Epworth 15 .titrated to only 5 cm water.   . Osteoporosis   . Sleep apnea   . Tinnitus    Past Surgical History:  Procedure Laterality Date  . AIR/FLUID EXCHANGE Right 09/12/2020   Procedure: AIR/FLUID EXCHANGE;  Surgeon: Bernarda Caffey, MD;  Location: [facility];  Service: Ophthalmology;  Laterality: Right;  . CATARACT EXTRACTION Bilateral OD: 10.25.21 OS: 12.2015   Dr. Venetia Maxon  . cataract surgery    . COSMETIC SURGERY    . EYE SURGERY Bilateral    Cat Sx - Dr. Venetia Maxon  . EYE SURGERY Right 09/12/2020   PPV/EL for retained lens material - Dr. Bernarda Caffey  . PARS PLANA VITRECTOMY Right 09/12/2020   Procedure: PARS PLANA VITRECTOMY WITH 23 GAUGE;  Surgeon: Bernarda Caffey, MD;  Location: [facility];  Service: Ophthalmology;  Laterality: Right;  . PHOTOCOAGULATION WITH LASER Right 09/12/2020    Procedure: PHOTOCOAGULATION WITH LASER;  Surgeon: Bernarda Caffey, MD;  Location: [facility];  Service: Ophthalmology;  Laterality: Right;  . REMOVAL RETAINED LENS Right 09/12/2020   Procedure: REMOVAL RETAINED LENS;  Surgeon: Bernarda Caffey, MD;  Location: [facility];  Service: Ophthalmology;  Laterality: Right;    FAMILY HISTORY Family History  Problem Relation Age of Onset  . Osteoporosis Mother   . Stroke Mother   . Hyperlipidemia Mother   . Hypertension Mother   . Kidney disease Father   . Congestive Heart Failure Father   . Heart disease Father   . Hyperlipidemia Father   . Stroke Sister   . Parkinson's disease Sister   . Heart disease Sister   . Heart disease Maternal Grandmother   . Gout Maternal Grandmother   . Stroke  Maternal Grandfather   . Stroke Paternal Grandmother   . Thrombosis Paternal Grandfather   . Early death Paternal Grandfather   . Hypertension Brother   . Diabetes Brother   . Arthritis/Rheumatoid Other   . Parkinson's disease Other   . Heart disease Other   . Obesity Daughter   . Breast cancer Neg Hx     SOCIAL HISTORY Social History   Tobacco Use  . Smoking status: Former Research scientist (life sciences)  . Smokeless tobacco: Never Used  . Tobacco comment: Quit in 1987  Vaping Use  . Vaping Use: Never used  Substance Use Topics  . Alcohol use: Yes    Alcohol/week: 12.0 standard drinks    Types: 12 Glasses of wine per week    Comment: wine or alcohol daily  . Drug use: No         OPHTHALMIC EXAM:  Base Eye Exam    Visual Acuity (Snellen - Linear)      Right Left   Dist [place] 20/20 -1 20/40 -2   Dist ph [place] NI 20/20 -1       Tonometry (Tonopen, 1:38 PM)      Right Left   Pressure 14 16       Pupils      Dark Light Shape React APD   Right 6 6 Round Minimal None   Left 3 2 Round Brisk None       Visual Fields (Counting fingers)      Left Right    Full Full       Extraocular Movement      Right Left    Full, Ortho Full, Ortho       Neuro/Psych    Oriented  x3: Yes   Mood/Affect: Normal       Dilation    Right eye: 1.0% Mydriacyl, 2.5% Phenylephrine @ 1:38 PM        Slit Lamp and Fundus Exam    Slit Lamp Exam      Right Left   Lids/Lashes Dermatochalasis - upper lid Dermatochalasis - upper lid   Conjunctiva/Sclera White and quiet White and quiet   Cornea 2-3+Punctate epithelial erosions, well healed temporal cataract wound, mild arcus 1+Punctate epithelial erosions, well healed temporal cataract wounds   Anterior Chamber Deep, 0.5+ Cell/pigment Deep and quiet, Deep   Iris Round and dilated, mild iridodenesis Round and dilated   Lens 3 piece sulcus IOL in good position, open PC, cortical remnants temporal hemisphere shrinking/disintegrating PC IOL in good position with trace PCO non-central   Vitreous post vitrectomy, small cortical remnant fragment settled inferiorly -- resolved, trace vitreous condensations syneresis       Fundus Exam      Right Left   Disc mild tilt, mild pallor, +cupping, mild PPP    C/D Ratio 0.6 0.5   Macula Flat, Blunted foveal reflex, mild RPE mottling, No heme or edema    Vessels attenuated, Tortuous    Periphery attached, retained lens material gone; 360 peripheral laser changes         Refraction    Manifest Refraction      Sphere Cylinder Axis Dist VA   Right -0.50 +0.25 020 20/20-1   Left -1.50 +0.75 160 20/20-2  K's: from AR  OD: 43.50/43.50          IMAGING AND PROCEDURES  Imaging and Procedures for 10/30/2020  OCT, Retina - OU - Both Eyes       Right Eye Quality was good. Central Foveal  Thickness: 289. Progression has been stable. Findings include normal foveal contour, no IRF, no SRF (stable improvement in vitreous opacities).   Left Eye Quality was good. Central Foveal Thickness: 285. Progression has been stable. Findings include normal foveal contour, no IRF, no SRF.   Notes *Images captured and stored on drive  Diagnosis / Impression:  NFP, no IRF/SRF OU OD: stable  improvement in vitreous opacities  Clinical management:  See below  Abbreviations: NFP - Normal foveal profile. CME - cystoid macular edema. PED - pigment epithelial detachment. IRF - intraretinal fluid. SRF - subretinal fluid. EZ - ellipsoid zone. ERM - epiretinal membrane. ORA - outer retinal atrophy. ORT - outer retinal tubulation. SRHM - subretinal hyper-reflective material. IRHM - intraretinal hyper-reflective material                ASSESSMENT/PLAN:    ICD-10-CM   1. Retained lens material following cataract surgery of right eye  H59.021   2. Retinal edema  H35.81 OCT, Retina - OU - Both Eyes  3. Essential hypertension  I10   4. Hypertensive retinopathy of both eyes  H35.033   5. Pseudophakia, both eyes  Z96.1     1. Retained lens material OD  - s/p CE, sulcus IOL on Monday 10.25.21  - broken posterior capsule, nuclear and cortical lens fragments in vitreous  - had IOP spike to 40 on 11.3.21  - improved post drops, AC tap and diamox by Dr. Venetia Maxon  - persistently elevated IOP and microcystic corneal edema  - s/p PPV/EL OD 11.11.21 for removal of retained lens material             - doing well  - VA 20/20 OD -- improved             - IOP good at 14  - cortical remnants in the bag shrinking/disintegrating; tr pieces in vit -- should continue to disintegrate and resolve             - cont   PF 6x/day OD    Brimonidine BID OD                         OTC gel  - continue ATs 2-3x/day OD -- pt states she hasn't been using             - post op drop and positioning instructions reviewed             - tylenol/ibuprofen for pain  - f/u 4 weeks, POV, DFE, OCT  2. No retinal edema on exam or OCT  3,4. Hypertensive retinopathy OU - discussed importance of tight BP control - monitor  5. Pseudophakia OU  - s/p CE/IOL (Dr. Venetia Maxon, OD: 10.25.21, OS: 12.2015)  - IOLs in good position  - retained lens material OD as above - monitor  Ophthalmic Meds Ordered this visit:   No orders of the defined types were placed in this encounter.      Return in about 4 weeks (around 11/27/2020) for f/u retianed lens material OD, DFE, OCT.  There are no Patient Instructions on file for this visit.   Explained the diagnoses, plan, and follow up with the patient and they expressed understanding.  Patient expressed understanding of the importance of proper follow up care.   This document serves as a record of services personally performed by Gardiner Sleeper, MD, PhD. It was created on their behalf by Roselee Nova, COMT. The creation of  this record is the provider's dictation and/or activities during the visit.  Electronically signed by: Roselee Nova, COMT 10/30/20 5:21 PM   This document serves as a record of services personally performed by Gardiner Sleeper, MD, PhD. It was created on their behalf by San Jetty. Owens Shark, OA an ophthalmic technician. The creation of this record is the provider's dictation and/or activities during the visit.    Electronically signed by: San Jetty. Owens Shark, [place] 12.29.2021 5:21 PM  Gardiner Sleeper, M.D., Ph.D. Diseases & Surgery of the Retina and Vitreous [facility]  I have reviewed the above documentation for accuracy and completeness, and I agree with the above. Gardiner Sleeper, M.D., Ph.D. 10/30/20 5:21 PM  Abbreviations: M myopia (nearsighted); A astigmatism; H hyperopia (farsighted); P presbyopia; Mrx spectacle prescription;  CTL contact lenses; OD right eye; OS left eye; OU both eyes  XT exotropia; ET esotropia; PEK punctate epithelial keratitis; PEE punctate epithelial erosions; DES dry eye syndrome; MGD meibomian gland dysfunction; ATs artificial tears; PFAT's preservative free artificial tears; [place] nuclear sclerotic cataract; PSC posterior subcapsular cataract; ERM epi-retinal membrane; PVD posterior vitreous detachment; RD retinal detachment; DM diabetes mellitus; DR diabetic retinopathy; NPDR non-proliferative  diabetic retinopathy; PDR proliferative diabetic retinopathy; CSME clinically significant macular edema; DME diabetic macular edema; dbh dot blot hemorrhages; CWS cotton wool spot; POAG primary open angle glaucoma; C/D cup-to-disc ratio; HVF humphrey visual field; GVF goldmann visual field; OCT optical coherence tomography; IOP intraocular pressure; BRVO Branch retinal vein occlusion; CRVO central retinal vein occlusion; CRAO central retinal artery occlusion; BRAO branch retinal artery occlusion; RT retinal tear; SB scleral buckle; PPV pars plana vitrectomy; VH Vitreous hemorrhage; PRP panretinal laser photocoagulation; IVK intravitreal kenalog; VMT vitreomacular traction; MH Macular hole;  NVD neovascularization of the disc; NVE neovascularization elsewhere; AREDS age related eye disease study; ARMD age related macular degeneration; POAG primary open angle glaucoma; EBMD epithelial/anterior basement membrane dystrophy; ACIOL anterior chamber intraocular lens; IOL intraocular lens; PCIOL posterior chamber intraocular lens; Phaco/IOL phacoemulsification with intraocular lens placement; [place] photorefractive keratectomy; LASIK laser assisted in situ keratomileusis; HTN hypertension; DM diabetes mellitus; COPD chronic obstructive pulmonary disease

## 2020-11-01 DIAGNOSIS — M9903 Segmental and somatic dysfunction of lumbar region: Secondary | ICD-10-CM | POA: Diagnosis not present

## 2020-11-01 DIAGNOSIS — M9901 Segmental and somatic dysfunction of cervical region: Secondary | ICD-10-CM | POA: Diagnosis not present

## 2020-11-01 DIAGNOSIS — M62830 Muscle spasm of back: Secondary | ICD-10-CM | POA: Diagnosis not present

## 2020-11-01 DIAGNOSIS — M5032 Other cervical disc degeneration, mid-cervical region, unspecified level: Secondary | ICD-10-CM | POA: Diagnosis not present

## 2020-11-05 DIAGNOSIS — M62830 Muscle spasm of back: Secondary | ICD-10-CM | POA: Diagnosis not present

## 2020-11-05 DIAGNOSIS — M9903 Segmental and somatic dysfunction of lumbar region: Secondary | ICD-10-CM | POA: Diagnosis not present

## 2020-11-05 DIAGNOSIS — M9901 Segmental and somatic dysfunction of cervical region: Secondary | ICD-10-CM | POA: Diagnosis not present

## 2020-11-05 DIAGNOSIS — M5032 Other cervical disc degeneration, mid-cervical region, unspecified level: Secondary | ICD-10-CM | POA: Diagnosis not present

## 2020-11-06 ENCOUNTER — Ambulatory Visit: Payer: Medicare HMO | Attending: Internal Medicine

## 2020-11-06 ENCOUNTER — Other Ambulatory Visit (HOSPITAL_COMMUNITY): Payer: Self-pay | Admitting: Internal Medicine

## 2020-11-06 DIAGNOSIS — Z23 Encounter for immunization: Secondary | ICD-10-CM

## 2020-11-06 NOTE — Progress Notes (Signed)
   Covid-19 Vaccination Clinic  Name:  Nicole Holt    MRN: [id number] DOB: 1941-03-06  11/06/2020  Nicole Holt was observed post Covid-19 immunization for 15 minutes without incident. She was provided with Vaccine Information Sheet and instruction to access the V-Safe system.   Nicole Holt was instructed to call 911 with any severe reactions post vaccine: Marland Kitchen Difficulty breathing  . Swelling of face and throat  . A fast heartbeat  . A bad rash all over body  . Dizziness and weakness   Immunizations Administered    Name Date Dose VIS Date Route   Pfizer COVID-19 Vaccine 11/06/2020 11:11 AM 0.3 mL 08/21/2020 Intramuscular   Manufacturer: [employer], [employer]   Lot: [id number]   NDC: [address]-0

## 2020-11-07 ENCOUNTER — Other Ambulatory Visit: Payer: Self-pay

## 2020-11-07 ENCOUNTER — Ambulatory Visit (INDEPENDENT_AMBULATORY_CARE_PROVIDER_SITE_OTHER): Payer: Medicare HMO

## 2020-11-07 VITALS — BP 120/74 | HR 77 | Temp 98.0°F | Resp 20 | Wt 175.0 lb

## 2020-11-07 DIAGNOSIS — M9903 Segmental and somatic dysfunction of lumbar region: Secondary | ICD-10-CM | POA: Diagnosis not present

## 2020-11-07 DIAGNOSIS — M5032 Other cervical disc degeneration, mid-cervical region, unspecified level: Secondary | ICD-10-CM | POA: Diagnosis not present

## 2020-11-07 DIAGNOSIS — Z0000 Encounter for general adult medical examination without abnormal findings: Secondary | ICD-10-CM

## 2020-11-07 DIAGNOSIS — M9901 Segmental and somatic dysfunction of cervical region: Secondary | ICD-10-CM | POA: Diagnosis not present

## 2020-11-07 DIAGNOSIS — M62830 Muscle spasm of back: Secondary | ICD-10-CM | POA: Diagnosis not present

## 2020-11-07 NOTE — Progress Notes (Signed)
Subjective:   Nicole Holt is a 80 y.o. female who presents for Medicare Annual (Subsequent) preventive examination.  Review of Systems      Cardiac Risk Factors include: advanced age (>107men, >44 women);dyslipidemia;obesity (BMI >30kg/m2)     Objective:    Today's Vitals   11/07/20 1057  BP: 120/74  Pulse: 77  Resp: 20  Temp: 98 F (36.7 C)  SpO2: 98%  Weight: 175 lb (79.4 kg)   Body mass index is 32.01 kg/m.  Advanced Directives 11/07/2020 09/12/2020 10/23/2019 10/14/2018 09/27/2017  Does Patient Have a Medical Advance Directive? Yes Yes Yes Yes Yes  Type of Paramedic of [place];Living will Living will Living will;Healthcare Power of [place];Living will [place];Living will  Does patient want to make changes to medical advance directive? - - No - Patient declined No - Patient declined -  Copy of [place] in Chart? Yes - validated most recent copy scanned in chart (See row information) - Yes - validated most recent copy scanned in chart (See row information) No - copy requested No - copy requested    Current Medications (verified) Outpatient Encounter Medications as of 11/07/2020  Medication Sig  . brimonidine (ALPHAGAN) 0.15 % ophthalmic solution Place 1 drop into the right eye 2 (two) times daily.  . calcium carbonate (TUMS - DOSED IN MG ELEMENTAL CALCIUM) 500 MG chewable tablet Chew 1-2 tablets by mouth 3 (three) times daily as needed for indigestion or heartburn.  . diazepam (VALIUM) 5 MG tablet Take 1 tablet (5 mg total) by mouth every 6 (six) hours as needed. for anxiety (Patient taking differently: Take 5 mg by mouth every 6 (six) hours as needed (panic attacks.).)  . fluticasone (FLONASE) 50 MCG/ACT nasal spray Use 2 spray(s) in each nostril once daily (Patient taking differently: Place 2 sprays into both nostrils daily as needed for allergies.)  . melatonin 3 MG TABS tablet  Take 3 mg by mouth at bedtime as needed (sleep).  . polyethylene glycol (MIRALAX / GLYCOLAX) 17 g packet Take 17 g by mouth daily.  . prednisoLONE acetate (PRED FORTE) 1 % ophthalmic suspension Place 1 drop into the right eye 6 (six) times daily.   . prednisoLONE acetate (PRED FORTE) 1 % ophthalmic suspension Place 1 drop into the right eye 6 (six) times daily.  . Travoprost, BAK Free, (TRAVATAN) 0.004 % SOLN ophthalmic solution Place 1 drop into both eyes at bedtime.  . Turmeric (QC TUMERIC COMPLEX PO) Take 1 capsule by mouth daily.   Marland Kitchen acetaZOLAMIDE (DIAMOX SEQUELS) 500 MG capsule Take 1 capsule (500 mg total) by mouth 2 (two) times daily. (Patient not taking: No sig reported)  . Calcium Carbonate-Vit D-Min (QC CALCIUM-MAGNESIUM-ZINC-D3 PO) Take 1 tablet by mouth once a week. (Patient not taking: Reported on 11/07/2020)  . gabapentin (NEURONTIN) 100 MG capsule Take 1-3 capsules (100-300 mg total) by mouth 3 (three) times daily as needed (nerve pain). (Patient not taking: Reported on 11/07/2020)  . Omega-3 Fatty Acids (FISH OIL PO) Take 1 capsule by mouth every 14 (fourteen) days.  (Patient not taking: Reported on 11/07/2020)  . Potassium 99 MG TABS Take 99 mg by mouth 2 (two) times a week. (Patient not taking: Reported on 11/07/2020)  . predniSONE (DELTASONE) 50 MG tablet Take 1 tablet (50 mg total) by mouth daily. (Patient not taking: Reported on 11/07/2020)  . [DISCONTINUED] Bromfenac Sodium (PROLENSA) 0.07 % SOLN Place 1 drop into the  right eye in the morning, at noon, in the evening, and at bedtime.  (Patient not taking: No sig reported)  . [DISCONTINUED] dorzolamide-timolol (COSOPT) 22.3-6.8 MG/ML ophthalmic solution Place 1 drop into the right eye in the morning, at noon, and at bedtime.  (Patient not taking: No sig reported)  . [DISCONTINUED] ofloxacin (OCUFLOX) 0.3 % ophthalmic solution Place 1 drop into the right eye 5 (five) times daily.  (Patient not taking: No sig reported)   No  facility-administered encounter medications on file as of 11/07/2020.    Allergies (verified) Codeine, Estrogens conjugated, and Paroxetine hcl   History: Past Medical History:  Diagnosis Date  . Allergy   . Anxiety    panic attack  . Depression   . GERD (gastroesophageal reflux disease)   . Glaucoma   . Glaucoma   . Hearing loss   . 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 Hyperlipidemia   . Hypertensive retinopathy    OU  . Migraines    ocular  . OSA on CPAP 02/02/2014   PSG 09-27-2012 RDI 18.9 and AHi 9.8, Epworth 15 .titrated to only 5 cm water.   . Osteoporosis   . Sleep apnea   . Tinnitus    Past Surgical History:  Procedure Laterality Date  . AIR/FLUID EXCHANGE Right 09/12/2020   Procedure: AIR/FLUID EXCHANGE;  Surgeon: Rennis Chris, MD;  Location: [facility] OR;  Service: Ophthalmology;  Laterality: Right;  . CATARACT EXTRACTION Bilateral OD: 10.25.21 OS: 12.2015   Dr. Harlon Flor  . cataract surgery    . COSMETIC SURGERY    . EYE SURGERY Bilateral    Cat Sx - Dr. Harlon Flor  . EYE SURGERY Right 09/12/2020   PPV/EL for retained lens material - Dr. Rennis Chris  . PARS PLANA VITRECTOMY Right 09/12/2020   Procedure: PARS PLANA VITRECTOMY WITH 23 GAUGE;  Surgeon: Rennis Chris, MD;  Location[facility] OR;  Service: Ophthalmology;  Laterality: Right;  . PHOTOCOAGULATION WITH LASER Right 09/12/2020   Procedure: PHOTOCOAGULATION WITH LASER;  Surgeon: Rennis Chris, MD;  Location: [facility] OR;  Service: Ophthalmology;  Laterality: Right;  . REMOVAL RETAINED LENS Right 09/12/2020   Procedure: REMOVAL RETAINED LENS;  Surgeon: Rennis Chris, MD;  Location[facility] OR;  Service: Ophthalmology;  Laterality: Right;   Family History  Problem Relation Age of Onset  . Osteoporosis Mother   . Stroke Mother   . Hyperlipidemia Mother   . Hypertension Mother   . Kidney disease Father   . Congestive Heart Failure Father   . Heart disease Father   . Hyperlipidemia Father   . Stroke  Sister   . Parkinson's disease Sister   . Heart disease Sister   . Heart disease Maternal Grandmother   . Gout Maternal Grandmother   . Stroke Maternal Grandfather   . Stroke Paternal Grandmother   . Thrombosis Paternal Grandfather   . Early death Paternal Grandfather   . Hypertension Brother   . Diabetes Brother   . Arthritis/Rheumatoid Other   . Parkinson's disease Other   . Heart disease Other   . Obesity Daughter   . Breast cancer Neg Hx    Social History   Socioeconomic History  . Marital status: Married    Spouse name: Casimiro Needle  . Number of children: 2  . Years of education: 18  . Highest education level: Some college, no degree  Occupational History  . Occupation: Retired  Tobacco Use  . Smoking status: Former Games developer  . Smokeless  tobacco: Never Used  . Tobacco comment: Quit in 1987  Vaping Use  . Vaping Use: Never used  Substance and Sexual Activity  . Alcohol use: Yes    Alcohol/week: 12.0 standard drinks    Types: 12 Glasses of wine per week    Comment: wine or alcohol daily  . Drug use: No  . Sexual activity: Yes    Birth control/protection: Post-menopausal  Other Topics Concern  . Not on file  Social History Narrative   Patient is married Legrand Como) and lives with her husband.   Patient is retired.   Patient has two children.   Patient is right-handed.   Patient has a college education.   Patient drinks two cups of coffee daily.            Social Determinants of Health   Financial Resource Strain: Low Risk   . Difficulty of Paying Living Expenses: Not hard at all  Food Insecurity: No Food Insecurity  . Worried About Charity fundraiser in the Last Year: Never true  . Ran Out of Food in the Last Year: Never true  Transportation Needs: No Transportation Needs  . Lack of Transportation (Medical): No  . Lack of Transportation (Non-Medical): No  Physical Activity: Inactive  . Days of Exercise per Week: 0 days  . Minutes of Exercise per Session: 0  min  Stress: Stress Concern Present  . Feeling of Stress : Rather much  Social Connections: Socially Isolated  . Frequency of Communication with Friends and Family: Once a week  . Frequency of Social Gatherings with Friends and Family: Once a week  . Attends Religious Services: Never  . Active Member of Clubs or Organizations: No  . Attends Archivist Meetings: Never  . Marital Status: Married    Tobacco Counseling Counseling given: Not Answered Comment: Quit in 1987   Clinical Intake:  Pre-visit preparation completed: Yes  Pain : No/denies pain     BMI - recorded: 32.01 Nutritional Status: BMI > 30  Obese Nutritional Risks: None Diabetes: No  How often do you need to have someone help you when you read instructions, pamphlets, or other written materials from your doctor or pharmacy?: 1 - Never  Diabetic?No  Interpreter Needed?: No  Information entered by :: Charlott Rakes, LPN   Activities of Daily Living In your present state of health, do you have any difficulty performing the following activities: 11/07/2020 09/12/2020  Hearing? Y -  Comment needs new hearing aids -  Vision? Y -  Comment under treatment for vitrectomy eye surgery -  Difficulty concentrating or making decisions? N -  Walking or climbing stairs? N -  Dressing or bathing? N -  Doing errands, shopping? N N  Preparing Food and eating ? N -  Using the Toilet? N -  In the past six months, have you accidently leaked urine? Y -  Comment urgency -  Do you have problems with loss of bowel control? N -  Managing your Medications? N -  Managing your Finances? N -  Housekeeping or managing your Housekeeping? N -  Some recent data might be hidden    Patient Care Team: Vivi Barrack, MD as PCP - General (Family Medicine) Dennie Bible, NP as Nurse Practitioner (Neurology) Marylynn Pearson, MD as Consulting Physician (Ophthalmology) Dohmeier, Asencion Partridge, MD as Consulting Physician  (Neurology) [facility], [facility] Annabell Howells, [facility] as Pharmacist (Pharmacist)  Indicate any recent Medical Services you may have received from other than  [facility] providers in the past year (date may be approximate).     Assessment:   This is a routine wellness examination for [place].  Hearing/Vision screen  Hearing Screening   125Hz  250Hz  500Hz  1000Hz  2000Hz  3000Hz  4000Hz  6000Hz  8000Hz   Right ear:           Left ear:           Comments: Pt states mild loss and has hearing aids that's not working at this time  Vision Screening Comments: Pt follows Dr Marylynn Pearson for annual eye care  Dietary issues and exercise activities discussed: Current Exercise Habits: The patient does not participate in regular exercise at present  Goals    . Patient Stated     Get back to being able to walk more     . PharmD Care Plan     CARE PLAN ENTRY (see longitudinal plan of care for additional care plan information)  Current Barriers:  . Chronic Disease Management support, education, and care coordination needs related to Hyperlipidemia and Anxiety   Hyperlipidemia Lab Results  Component Value Date/Time   LDLCALC 102 (H) 11/08/2019 11:47 AM   LDLCALC 102 (H) 09/27/2017 10:57 AM   LDLDIRECT 116.0 10/14/2018 12:06 PM   . Pharmacist Clinical Goal(s): o Over the next 180 days, patient will work with PharmD and providers to achieve LDL goal < 100 . Current regimen:  o Diet and exercise (as tolerated) alone . Interventions: o Reviewed dietary recommendations including focus on fruits/vegetables, white meats, salt avoidance, limiting sweets.  o Recommend substituting nuts over carb rich snacks such as crackers . Patient self care activities - Over the next 180 days, patient will: o Continue current management Anxiety . Pharmacist Clinical Goal(s) o Over the next 180 days, patient will work with PharmD and providers to minimize symptoms of anxiety and ensure medication safety . Current  regimen:  o Diazepam every 6 hours as needed . Interventions: o Reviewed potential options for antianxiety pharmacotherapy, counseling . Patient self care activities - Over the next 180 days, patient will: o Continue current management o Let office know if she is wanting additional support for anxiety  Medication management . Pharmacist Clinical Goal(s): o Over the next 180 days, patient will work with PharmD and providers to maintain optimal medication adherence . Current pharmacy: [facility] . Interventions o Comprehensive medication review performed. o Continue current medication management strategy . Patient self care activities - Over the next 180 days, patient will: o Take medications as prescribed o Report any questions or concerns to PharmD and/or provider(s) Initial goal documentation.    . Reduce alcohol intake     Patient states that she wants to try to decrease her alcohol intake daily.       Depression Screen PHQ 2/9 Scores 11/07/2020 11/09/2019 10/23/2019 04/24/2019 12/06/2018 10/14/2018 05/24/2018  PHQ - 2 Score 3 3 0 1 0 3 0  PHQ- 9 Score 3 8 - 4 - 7 -  Exception Documentation - - - - - - -    Fall Risk Fall Risk  11/07/2020 10/23/2019 10/14/2018 05/24/2018 01/19/2018  Falls in the past year? 0 1 0 No No  Number falls in past yr: 0 - - - -  Injury with Fall? 0 1 - - -  Risk for fall due to : Impaired vision;Impaired balance/gait;Impaired mobility - - - -  Follow up Falls prevention discussed Falls evaluation completed;Education provided;Falls prevention discussed - - -    FALL RISK PREVENTION PERTAINING TO  THE HOME:  Any stairs in or around the home? Yes  If so, are there any without handrails? No  Home free of loose throw rugs in walkways, pet beds, electrical cords, etc? Yes  Adequate lighting in your home to reduce risk of falls? Yes   ASSISTIVE DEVICES UTILIZED TO PREVENT FALLS:  Life alert? No  Use of a cane, walker or w/c? No  Grab bars in the bathroom? No   Shower chair or bench in shower? No  Elevated toilet seat or a handicapped toilet? No   TIMED UP AND GO:  Was the test performed? Yes .  Length of time to ambulate 10 feet: 15 sec.   Gait slow and steady without use of assistive device  Cognitive Function: Declined 6CIT MMSE - Mini Mental State Exam 10/14/2018  Orientation to time 5  Orientation to Place 5  Registration 3  Attention/ Calculation 5  Recall 3  Language- name 2 objects 2  Language- repeat 1  Language- follow 3 step command 3  Language- read & follow direction 1  Write a sentence 1  Copy design 1  Total score 30   Montreal Cognitive Assessment  03/10/2017 03/10/2016  Visuospatial/ Executive (0/5) 5 3  Naming (0/3) 3 3  Attention: Read list of digits (0/2) 2 2  Attention: Read list of letters (0/1) 1 1  Attention: Serial 7 subtraction starting at 100 (0/3) 2 3  Language: Repeat phrase (0/2) 1 1  Language : Fluency (0/1) 1 0  Abstraction (0/2) 2 2  Delayed Recall (0/5) 5 3  Orientation (0/6) 6 6  Total 28 24  Adjusted Score (based on education) - 25   6CIT Screen 09/27/2017  What Year? 0 points  What month? 0 points  What time? 0 points  Count back from 20 0 points  Months in reverse 0 points  Repeat phrase 6 points  Total Score 6    Immunizations Immunization History  Administered Date(s) Administered  . PFIZER SARS-COV-2 Vaccination 01/07/2020, 02/07/2020, 11/06/2020  . Pneumococcal Conjugate-13 09/22/2016  . Pneumococcal Polysaccharide-23 07/27/2012  . Td 11/03/2007  . Zoster 10/02/2015    TDAP status: Due, Education has been provided regarding the importance of this vaccine. Advised may receive this vaccine at local pharmacy or Health Dept. Aware to provide a copy of the vaccination record if obtained from local pharmacy or Health Dept. Verbalized acceptance and understanding.  Flu Vaccine status: Declined, Education has been provided regarding the importance of this vaccine but patient still  declined. Advised may receive this vaccine at local pharmacy or Health Dept. Aware to provide a copy of the vaccination record if obtained from local pharmacy or Health Dept. Verbalized acceptance and understanding.  Pneumococcal vaccine status: Up to date  Covid-19 vaccine status: Completed vaccines  Qualifies for Shingles Vaccine? Yes   Zostavax completed Yes   Shingrix Completed?: No.    Education has been provided regarding the importance of this vaccine. Patient has been advised to call insurance company to determine out of pocket expense if they have not yet received this vaccine. Advised may also receive vaccine at local pharmacy or Health Dept. Verbalized acceptance and understanding.  Screening Tests Health Maintenance  Topic Date Due  . Hepatitis C Screening  Never done  . TETANUS/TDAP  11/02/2017  . INFLUENZA VACCINE  09/28/2027 (Originally 06/02/2020)  . COVID-19 Vaccine (4 - Booster for Pfizer series) 05/06/2021  . DEXA SCAN  Completed  . PNA vac Low Risk Adult  Completed  Health Maintenance  Health Maintenance Due  Topic Date Due  . Hepatitis C Screening  Never done  . TETANUS/TDAP  11/02/2017    Colorectal cancer screening: No longer required.   Mammogram status: Completed 08/22/20. Repeat every year  Bone Density status: Completed 12/09/16. Results reflect: Bone density results: OSTEOPOROSIS. Repeat every 2 years.   Additional Screening:  Hepatitis C Screening: does qualify  Vision Screening: Recommended annual ophthalmology exams for early detection of glaucoma and other disorders of the eye. Is the patient up to date with their annual eye exam?  Yes  Who is the provider or what is the name of the office in which the patient attends annual eye exams? Dr Marylynn Pearson   Dental Screening: Recommended annual dental exams for proper oral hygiene  Community Resource Referral / Chronic Care Management: CRR required this visit?  No   CCM required this visit?   No      Plan:     I have personally reviewed and noted the following in the patient's chart:   . Medical and social history . Use of alcohol, tobacco or illicit drugs  . Current medications and supplements . Functional ability and status . Nutritional status . Physical activity . Advanced directives . List of other physicians . Hospitalizations, surgeries, and ER visits in previous 12 months . Vitals . Screenings to include cognitive, depression, and falls . Referrals and appointments  In addition, I have reviewed and discussed with patient certain preventive protocols, quality metrics, and best practice recommendations. A written personalized care plan for preventive services as well as general preventive health recommendations were provided to patient.     Willette Brace, LPN   [id number]   Nurse Notes: None

## 2020-11-07 NOTE — Patient Instructions (Addendum)
[name] , Thank you for taking time to come for your Medicare Wellness Visit. I appreciate your ongoing commitment to your health goals. Please review the following plan we discussed and let me know if I can assist you in the future.   Screening recommendations/referrals: Colonoscopy: Done 05/17/19 Mammogram: Done 08/22/20 Bone Density: Done 12/09/16 Recommended yearly ophthalmology/optometry visit for glaucoma screening and checkup Recommended yearly dental visit for hygiene and checkup  Vaccinations: Influenza vaccine: Declined and discussed Pneumococcal vaccine: Up to date Tdap vaccine: Due and discussed  Shingles vaccine: Shingrix discussed. Please contact your pharmacy for coverage information.    Covid-19:Completed 3/7, 02/07/20 & 11/06/20  Advanced directives: Copies in chart  Conditions/risks identified: more exercise  Next appointment: Follow up in one year for your annual wellness visit    Preventive Care 65 Years and Older, Female Preventive care refers to lifestyle choices and visits with your health care provider that can promote health and wellness. What does preventive care include?  A yearly physical exam. This is also called an annual well check.  Dental exams once or twice a year.  Routine eye exams. Ask your health care provider how often you should have your eyes checked.  Personal lifestyle choices, including:  Daily care of your teeth and gums.  Regular physical activity.  Eating a healthy diet.  Avoiding tobacco and drug use.  Limiting alcohol use.  Practicing safe sex.  Taking low-dose aspirin every day.  Taking vitamin and mineral supplements as recommended by your health care provider. What happens during an annual well check? The services and screenings done by your health care provider during your annual well check will depend on your age, overall health, lifestyle risk factors, and family history of disease. Counseling  Your health care  provider may ask you questions about your:  Alcohol use.  Tobacco use.  Drug use.  Emotional well-being.  Home and relationship well-being.  Sexual activity.  Eating habits.  History of falls.  Memory and ability to understand (cognition).  Work and work Astronomer.  Reproductive health. Screening  You may have the following tests or measurements:  Height, weight, and BMI.  Blood pressure.  Lipid and cholesterol levels. These may be checked every 5 years, or more frequently if you are over 56 years old.  Skin check.  Lung cancer screening. You may have this screening every year starting at age 53 if you have a 30-pack-year history of smoking and currently smoke or have quit within the past 15 years.  Fecal occult blood test (FOBT) of the stool. You may have this test every year starting at age 73.  Flexible sigmoidoscopy or colonoscopy. You may have a sigmoidoscopy every 5 years or a colonoscopy every 10 years starting at age 51.  Hepatitis C blood test.  Hepatitis B blood test.  Sexually transmitted disease (STD) testing.  Diabetes screening. This is done by checking your blood sugar (glucose) after you have not eaten for a while (fasting). You may have this done every 1-3 years.  Bone density scan. This is done to screen for osteoporosis. You may have this done starting at age 109.  Mammogram. This may be done every 1-2 years. Talk to your health care provider about how often you should have regular mammograms. Talk with your health care provider about your test results, treatment options, and if necessary, the need for more tests. Vaccines  Your health care provider may recommend certain vaccines, such as:  Influenza vaccine. This is recommended  every year.  Tetanus, diphtheria, and acellular pertussis (Tdap, Td) vaccine. You may need a Td booster every 10 years.  Zoster vaccine. You may need this after age 19.  Pneumococcal 13-valent conjugate (PCV13)  vaccine. One dose is recommended after age 57.  Pneumococcal polysaccharide (PPSV23) vaccine. One dose is recommended after age 23. Talk to your health care provider about which screenings and vaccines you need and how often you need them. This information is not intended to replace advice given to you by your health care provider. Make sure you discuss any questions you have with your health care provider. Document Released: 11/15/2015 Document Revised: 07/08/2016 Document Reviewed: 08/20/2015 Elsevier Interactive Patient Education  2017 [place] Prevention in the Home Falls can cause injuries. They can happen to people of all ages. There are many things you can do to make your home safe and to help prevent falls. What can I do on the outside of my home?  Regularly fix the edges of walkways and driveways and fix any cracks.  Remove anything that might make you trip as you walk through a door, such as a raised step or threshold.  Trim any bushes or trees on the path to your home.  Use bright outdoor lighting.  Clear any walking paths of anything that might make someone trip, such as rocks or tools.  Regularly check to see if handrails are loose or broken. Make sure that both sides of any steps have handrails.  Any raised decks and porches should have guardrails on the edges.  Have any leaves, snow, or ice cleared regularly.  Use sand or salt on walking paths during winter.  Clean up any spills in your garage right away. This includes oil or grease spills. What can I do in the bathroom?  Use night lights.  Install grab bars by the toilet and in the tub and shower. Do not use towel bars as grab bars.  Use non-skid mats or decals in the tub or shower.  If you need to sit down in the shower, use a plastic, non-slip stool.  Keep the floor dry. Clean up any water that spills on the floor as soon as it happens.  Remove soap buildup in the tub or shower  regularly.  Attach bath mats securely with double-sided non-slip rug tape.  Do not have throw rugs and other things on the floor that can make you trip. What can I do in the bedroom?  Use night lights.  Make sure that you have a light by your bed that is easy to reach.  Do not use any sheets or blankets that are too big for your bed. They should not hang down onto the floor.  Have a firm chair that has side arms. You can use this for support while you get dressed.  Do not have throw rugs and other things on the floor that can make you trip. What can I do in the kitchen?  Clean up any spills right away.  Avoid walking on wet floors.  Keep items that you use a lot in easy-to-reach places.  If you need to reach something above you, use a strong step stool that has a grab bar.  Keep electrical cords out of the way.  Do not use floor polish or wax that makes floors slippery. If you must use wax, use non-skid floor wax.  Do not have throw rugs and other things on the floor that can make you trip. What  can I do with my stairs?  Do not leave any items on the stairs.  Make sure that there are handrails on both sides of the stairs and use them. Fix handrails that are broken or loose. Make sure that handrails are as long as the stairways.  Check any carpeting to make sure that it is firmly attached to the stairs. Fix any carpet that is loose or worn.  Avoid having throw rugs at the top or bottom of the stairs. If you do have throw rugs, attach them to the floor with carpet tape.  Make sure that you have a light switch at the top of the stairs and the bottom of the stairs. If you do not have them, ask someone to add them for you. What else can I do to help prevent falls?  Wear shoes that:  Do not have high heels.  Have rubber bottoms.  Are comfortable and fit you well.  Are closed at the toe. Do not wear sandals.  If you use a stepladder:  Make sure that it is fully  opened. Do not climb a closed stepladder.  Make sure that both sides of the stepladder are locked into place.  Ask someone to hold it for you, if possible.  Clearly mark and make sure that you can see:  Any grab bars or handrails.  First and last steps.  Where the edge of each step is.  Use tools that help you move around (mobility aids) if they are needed. These include:  Canes.  Walkers.  Scooters.  Crutches.  Turn on the lights when you go into a dark area. Replace any light bulbs as soon as they burn out.  Set up your furniture so you have a clear path. Avoid moving your furniture around.  If any of your floors are uneven, fix them.  If there are any pets around you, be aware of where they are.  Review your medicines with your doctor. Some medicines can make you feel dizzy. This can increase your chance of falling. Ask your doctor what other things that you can do to help prevent falls. This information is not intended to replace advice given to you by your health care provider. Make sure you discuss any questions you have with your health care provider. Document Released: 08/15/2009 Document Revised: 03/26/2016 Document Reviewed: 11/23/2014 Elsevier Interactive Patient Education  2017 [employer].

## 2020-11-12 ENCOUNTER — Other Ambulatory Visit: Payer: Self-pay

## 2020-11-12 ENCOUNTER — Ambulatory Visit (INDEPENDENT_AMBULATORY_CARE_PROVIDER_SITE_OTHER): Payer: Medicare HMO | Admitting: Family Medicine

## 2020-11-12 ENCOUNTER — Encounter: Payer: Self-pay | Admitting: Family Medicine

## 2020-11-12 VITALS — BP 126/82 | HR 60 | Temp 97.9°F | Ht 62.0 in | Wt 176.0 lb

## 2020-11-12 DIAGNOSIS — Z0001 Encounter for general adult medical examination with abnormal findings: Secondary | ICD-10-CM | POA: Diagnosis not present

## 2020-11-12 DIAGNOSIS — M9903 Segmental and somatic dysfunction of lumbar region: Secondary | ICD-10-CM | POA: Diagnosis not present

## 2020-11-12 DIAGNOSIS — M9901 Segmental and somatic dysfunction of cervical region: Secondary | ICD-10-CM | POA: Diagnosis not present

## 2020-11-12 DIAGNOSIS — M5032 Other cervical disc degeneration, mid-cervical region, unspecified level: Secondary | ICD-10-CM | POA: Diagnosis not present

## 2020-11-12 DIAGNOSIS — F419 Anxiety disorder, unspecified: Secondary | ICD-10-CM

## 2020-11-12 DIAGNOSIS — G4733 Obstructive sleep apnea (adult) (pediatric): Secondary | ICD-10-CM | POA: Diagnosis not present

## 2020-11-12 DIAGNOSIS — R739 Hyperglycemia, unspecified: Secondary | ICD-10-CM | POA: Diagnosis not present

## 2020-11-12 DIAGNOSIS — E785 Hyperlipidemia, unspecified: Secondary | ICD-10-CM | POA: Diagnosis not present

## 2020-11-12 DIAGNOSIS — F338 Other recurrent depressive disorders: Secondary | ICD-10-CM | POA: Diagnosis not present

## 2020-11-12 DIAGNOSIS — M62830 Muscle spasm of back: Secondary | ICD-10-CM | POA: Diagnosis not present

## 2020-11-12 DIAGNOSIS — Z23 Encounter for immunization: Secondary | ICD-10-CM | POA: Diagnosis not present

## 2020-11-12 LAB — COMPREHENSIVE METABOLIC PANEL
ALT: 19 U/L (ref 0–35)
AST: 18 U/L (ref 0–37)
Albumin: 4.4 g/dL (ref 3.5–5.2)
Alkaline Phosphatase: 74 U/L (ref 39–117)
BUN: 14 mg/dL (ref 6–23)
CO2: 30 mEq/L (ref 19–32)
Calcium: 9.3 mg/dL (ref 8.4–10.5)
Chloride: 99 mEq/L (ref 96–112)
Creatinine, Ser: 0.87 mg/dL (ref 0.40–1.20)
GFR: 63.8 mL/min (ref >60.00)
Glucose, Bld: 84 mg/dL (ref 70–99)
Potassium: 4 mEq/L (ref 3.5–5.1)
Sodium: 135 mEq/L (ref 135–145)
Total Bilirubin: 0.5 mg/dL (ref 0.2–1.2)
Total Protein: 7.2 g/dL (ref 6.0–8.3)

## 2020-11-12 LAB — LIPID PANEL
Cholesterol: 188 mg/dL (ref 0–200)
HDL: 54.5 mg/dL (ref >39.00)
NonHDL: 133.65
Total CHOL/HDL Ratio: 3
Triglycerides: 205 mg/dL — ABNORMAL HIGH (ref 0.0–149.0)
VLDL: 41 mg/dL — ABNORMAL HIGH (ref 0.0–40.0)

## 2020-11-12 LAB — TSH: TSH: 1.88 u[IU]/mL (ref 0.35–4.50)

## 2020-11-12 LAB — CBC
HCT: 41.1 % (ref 36.0–46.0)
Hemoglobin: 13.9 g/dL (ref 12.0–15.0)
MCHC: 33.8 g/dL (ref 30.0–36.0)
MCV: 91.9 fl (ref 78.0–100.0)
Platelets: 273 10*3/uL (ref 150.0–400.0)
RBC: 4.47 Mil/uL (ref 3.87–5.11)
RDW: 14.8 % (ref 11.5–15.5)
WBC: 5.9 10*3/uL (ref 4.0–10.5)

## 2020-11-12 LAB — HEMOGLOBIN A1C: Hgb A1c MFr Bld: 5.8 % (ref 4.6–6.5)

## 2020-11-12 LAB — LDL CHOLESTEROL, DIRECT: Direct LDL: 111 mg/dL

## 2020-11-12 NOTE — Assessment & Plan Note (Signed)
Doing much better.  Continue Valium 5 mg 3 times daily as needed.

## 2020-11-12 NOTE — Patient Instructions (Signed)
It was very nice to see you today!  I am glad that you are feeling better.  We will check blood work.  We will also get your tetanus vaccine.  I will see you back in year for your next annual physical.  Please come back to see me sooner if needed.  Take care, Dr Jerline Pain  Please try these tips to maintain a healthy lifestyle:   Eat at least 3 REAL meals and 1-2 snacks per day.  Aim for no more than 5 hours between eating.  If you eat breakfast, please do so within one hour of getting up.    Each meal should contain half fruits/vegetables, one quarter protein, and one quarter carbs (no bigger than a computer mouse)   Cut down on sweet beverages. This includes juice, soda, and sweet tea.     Drink at least 1 glass of water with each meal and aim for at least 8 glasses per day   Exercise at least 150 minutes every week.    Preventive Care 46 Years and Older, Female Preventive care refers to lifestyle choices and visits with your health care provider that can promote health and wellness. This includes:  A yearly physical exam. This is also called an annual wellness visit.  Regular dental and eye exams.  Immunizations.  Screening for certain conditions.  Healthy lifestyle choices, such as: ? Eating a healthy diet. ? Getting regular exercise. ? Not using drugs or products that contain nicotine and tobacco. ? Limiting alcohol use. What can I expect for my preventive care visit? Physical exam Your health care provider will check your:  Height and weight. These may be used to calculate your BMI (body mass index). BMI is a measurement that tells if you are at a healthy weight.  Heart rate and blood pressure.  Body temperature.  Skin for abnormal spots. Counseling Your health care provider may ask you questions about your:  Past medical problems.  Family's medical history.  Alcohol, tobacco, and drug use.  Emotional well-being.  Home life and relationship  well-being.  Sexual activity.  Diet, exercise, and sleep habits.  History of falls.  Memory and ability to understand (cognition).  Work and work Statistician.  Pregnancy and menstrual history.  Access to firearms. What immunizations do I need? Vaccines are usually given at various ages, according to a schedule. Your health care provider will recommend vaccines for you based on your age, medical history, and lifestyle or other factors, such as travel or where you work.   What tests do I need? Blood tests  Lipid and cholesterol levels. These may be checked every 5 years, or more often depending on your overall health.  Hepatitis C test.  Hepatitis B test. Screening  Lung cancer screening. You may have this screening every year starting at age 80 if you have a 30-pack-year history of smoking and currently smoke or have quit within the past 15 years.  Colorectal cancer screening. ? All adults should have this screening starting at age 61 and continuing until age 88. ? Your health care provider may recommend screening at age 52 if you are at increased risk. ? You will have tests every 1-10 years, depending on your results and the type of screening test.  Diabetes screening. ? This is done by checking your blood sugar (glucose) after you have not eaten for a while (fasting). ? You may have this done every 1-3 years.  Mammogram. ? This may be done every 1-2  years. ? Talk with your health care provider about how often you should have regular mammograms.  Abdominal aortic aneurysm (AAA) screening. You may need this if you are a current or former smoker.  BRCA-related cancer screening. This may be done if you have a family history of breast, ovarian, tubal, or peritoneal cancers. Other tests  STD (sexually transmitted disease) testing, if you are at risk.  Bone density scan. This is done to screen for osteoporosis. You may have this done starting at age 46. Talk with your  health care provider about your test results, treatment options, and if necessary, the need for more tests. Follow these instructions at home: Eating and drinking  Eat a diet that includes fresh fruits and vegetables, whole grains, lean protein, and low-fat dairy products. Limit your intake of foods with high amounts of sugar, saturated fats, and salt.  Take vitamin and mineral supplements as recommended by your health care provider.  Do not drink alcohol if your health care provider tells you not to drink.  If you drink alcohol: ? Limit how much you have to 0-1 drink a day. ? Be aware of how much alcohol is in your drink. In the [place] one drink equals one 12 oz bottle of beer (355 mL), one 5 oz glass of wine (148 mL), or one 1 oz glass of hard liquor (44 mL).   Lifestyle  Take daily care of your teeth and gums. Brush your teeth every morning and night with fluoride toothpaste. Floss one time each day.  Stay active. Exercise for at least 30 minutes 5 or more days each week.  Do not use any products that contain nicotine or tobacco, such as cigarettes, e-cigarettes, and chewing tobacco. If you need help quitting, ask your health care provider.  Do not use drugs.  If you are sexually active, practice safe sex. Use a condom or other form of protection in order to prevent STIs (sexually transmitted infections).  Talk with your health care provider about taking a low-dose aspirin or statin.  Find healthy ways to cope with stress, such as: ? Meditation, yoga, or listening to music. ? Journaling. ? Talking to a trusted person. ? Spending time with friends and family. Safety  Always wear your seat belt while driving or riding in a vehicle.  Do not drive: ? If you have been drinking alcohol. Do not ride with someone who has been drinking. ? When you are tired or distracted. ? While texting.  Wear a helmet and other protective equipment during sports activities.  If you have  firearms in your house, make sure you follow all gun safety procedures. What's next?  Visit your health care provider once a year for an annual wellness visit.  Ask your health care provider how often you should have your eyes and teeth checked.  Stay up to date on all vaccines. This information is not intended to replace advice given to you by your health care provider. Make sure you discuss any questions you have with your health care provider. Document Revised: 10/09/2020 Document Reviewed: 10/13/2018 Elsevier Patient Education  2021 [employer].

## 2020-11-12 NOTE — Assessment & Plan Note (Signed)
Check lipids, CBC, CMET, TSH.

## 2020-11-12 NOTE — Assessment & Plan Note (Signed)
Overall stable.  Continue Valium as needed.

## 2020-11-12 NOTE — Addendum Note (Signed)
Addended by: Betti Cruz on: 11/12/2020 11:50 AM   Modules accepted: Orders

## 2020-11-12 NOTE — Assessment & Plan Note (Signed)
Check A1c. 

## 2020-11-12 NOTE — Progress Notes (Signed)
Chief Complaint:  Nicole Holt is a 80 y.o. female who presents today for her annual comprehensive physical exam.    Assessment/Plan:  Chronic Problems Addressed Today: Anxiety Doing much better.  Continue Valium 5 mg 3 times daily as needed.  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  Continue Valium as needed.  Hyperglycemia Check A1c.   Dyslipidemia Check lipids, CBC, CMET, TSH.  Preventative Healthcare: Tdap given today.  Check CBC, CMET, TSH, lipid panel.  Up-to-date on other vaccines and screenings.  Patient Counseling(The following topics were reviewed and/or handout was given):  -Nutrition: Stressed importance of moderation in sodium/caffeine intake, saturated fat and cholesterol, caloric balance, sufficient intake of fresh fruits, vegetables, and fiber.  -Stressed the importance of regular exercise.   -Substance Abuse: Discussed cessation/primary prevention of tobacco, alcohol, or other drug use; driving or other dangerous activities under the influence; availability of treatment for abuse.   -Injury prevention: Discussed safety belts, safety helmets, smoke detector, smoking near bedding or upholstery.   -Sexuality: Discussed sexually transmitted diseases, partner selection, use of condoms, avoidance of unintended pregnancy and contraceptive alternatives.   -Dental health: Discussed importance of regular tooth brushing, flossing, and dental visits.  -Health maintenance and immunizations reviewed. Please refer to Health maintenance section.  Return to care in 1 year for next preventative visit.     Subjective:  HPI:  She has no acute complaints today.   Lifestyle Diet: Balanced.  Exercise: Working with PT.   Depression screen PHQ 2/9 11/07/2020  Decreased Interest 1  Down, Depressed, Hopeless 2  PHQ - 2 Score 3  Altered sleeping 0  Tired, decreased energy -  Change in appetite 0  Feeling bad or failure about yourself  0  Trouble concentrating 0   Moving slowly or fidgety/restless 0  Suicidal thoughts 0  PHQ-9 Score 3  Difficult doing work/chores Somewhat difficult    Health Maintenance Due  Topic Date Due  . Hepatitis C Screening  Never done  . TETANUS/TDAP  11/02/2017     ROS: Per HPI, otherwise a complete review of systems was negative.   PMH:  The following were reviewed and entered/updated in epic: Past Medical History:  Diagnosis Date  . Allergy   . Anxiety    panic attack  . Depression   . GERD (gastroesophageal reflux disease)   . Glaucoma   . Glaucoma   . Hearing loss   . 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 Hyperlipidemia   . Hypertensive retinopathy    OU  . Migraines    ocular  . OSA on CPAP 02/02/2014   PSG 09-27-2012 RDI 18.9 and AHi 9.8, Epworth 15 .titrated to only 5 cm water.   . Osteoporosis   . Sleep apnea   . Tinnitus    Patient Active Problem List   Diagnosis Date Noted  . 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 PND (post-nasal drip) 10/30/2019  . Sleep related choking sensation 10/30/2019  . Insomnia 08/31/2019  . Allergic rhinitis 04/24/2019  . Hyperglycemia 10/19/2018  . Constipation 10/14/2018  . Rectal bleeding 10/14/2018  . Dyslipidemia 05/24/2018  . Abnormal mammogram of right breast 09/22/2016  . Rosacea 09/18/2015  . S/P cataract surgery 09/18/2015  . OSA on CPAP 02/02/2014  . Tremor, essential 02/02/2014  . UARS (upper airway resistance syndrome) 02/02/2014  . Osteoporosis 07/28/2012  . Glaucoma 07/28/2012  . Restless leg syndrome 07/28/2012  . Anxiety 07/28/2012  . Benign positional vertigo  07/28/2012  . Tinnitus 07/27/2012  . Hearing loss 07/27/2012  . Basal cell cancer 07/27/2012   Past Surgical History:  Procedure Laterality Date  . AIR/FLUID EXCHANGE Right 09/12/2020   Procedure: AIR/FLUID EXCHANGE;  Surgeon: Bernarda Caffey, MD;  Location: [facility];  Service: Ophthalmology;  Laterality: Right;  . CATARACT EXTRACTION Bilateral  OD: 10.25.21 OS: 12.2015   Dr. Venetia Maxon  . cataract surgery    . COSMETIC SURGERY    . EYE SURGERY Bilateral    Cat Sx - Dr. Venetia Maxon  . EYE SURGERY Right 09/12/2020   PPV/EL for retained lens material - Dr. Bernarda Caffey  . PARS PLANA VITRECTOMY Right 09/12/2020   Procedure: PARS PLANA VITRECTOMY WITH 23 GAUGE;  Surgeon: Bernarda Caffey, MD;  Location: [facility];  Service: Ophthalmology;  Laterality: Right;  . PHOTOCOAGULATION WITH LASER Right 09/12/2020   Procedure: PHOTOCOAGULATION WITH LASER;  Surgeon: Bernarda Caffey, MD;  Location: [facility];  Service: Ophthalmology;  Laterality: Right;  . REMOVAL RETAINED LENS Right 09/12/2020   Procedure: REMOVAL RETAINED LENS;  Surgeon: Bernarda Caffey, MD;  Location: [facility];  Service: Ophthalmology;  Laterality: Right;    Family History  Problem Relation Age of Onset  . Osteoporosis Mother   . Stroke Mother   . Hyperlipidemia Mother   . Hypertension Mother   . Kidney disease Father   . Congestive Heart Failure Father   . Heart disease Father   . Hyperlipidemia Father   . Stroke Sister   . Parkinson's disease Sister   . Heart disease Sister   . Heart disease Maternal Grandmother   . Gout Maternal Grandmother   . Stroke Maternal Grandfather   . Stroke Paternal Grandmother   . Thrombosis Paternal Grandfather   . Early death Paternal Grandfather   . Hypertension Brother   . Diabetes Brother   . Arthritis/Rheumatoid Other   . Parkinson's disease Other   . Heart disease Other   . Obesity Daughter   . Breast cancer Neg Hx     Medications- reviewed and updated Current Outpatient Medications  Medication Sig Dispense Refill  . acetaZOLAMIDE (DIAMOX SEQUELS) 500 MG capsule Take 1 capsule (500 mg total) by mouth 2 (two) times daily. 60 capsule 0  . brimonidine (ALPHAGAN) 0.15 % ophthalmic solution Place 1 drop into the right eye 2 (two) times daily.    . calcium carbonate (TUMS - DOSED IN MG ELEMENTAL CALCIUM) 500 MG chewable tablet Chew 1-2 tablets by  mouth 3 (three) times daily as needed for indigestion or heartburn.    . Calcium Carbonate-Vit D-Min (QC CALCIUM-MAGNESIUM-ZINC-D3 PO) Take 1 tablet by mouth once a week.    . diazepam (VALIUM) 5 MG tablet Take 1 tablet (5 mg total) by mouth every 6 (six) hours as needed. for anxiety (Patient taking differently: Take 5 mg by mouth every 6 (six) hours as needed (panic attacks.).) 30 tablet 0  . fluticasone (FLONASE) 50 MCG/ACT nasal spray Use 2 spray(s) in each nostril once daily (Patient taking differently: Place 2 sprays into both nostrils daily as needed for allergies.) 16 g 0  . gabapentin (NEURONTIN) 100 MG capsule Take 1-3 capsules (100-300 mg total) by mouth 3 (three) times daily as needed (nerve pain). 90 capsule 1  . melatonin 3 MG TABS tablet Take 3 mg by mouth at bedtime as needed (sleep).    . Omega-3 Fatty Acids (FISH OIL PO) Take 1 capsule by mouth every 14 (fourteen) days.    . polyethylene glycol (MIRALAX / GLYCOLAX) 17  g packet Take 17 g by mouth daily.    . Potassium 99 MG TABS Take 99 mg by mouth 2 (two) times a week.    . prednisoLONE acetate (PRED FORTE) 1 % ophthalmic suspension Place 1 drop into the right eye 6 (six) times daily.     . prednisoLONE acetate (PRED FORTE) 1 % ophthalmic suspension Place 1 drop into the right eye 6 (six) times daily. 10 mL 0  . Travoprost, BAK Free, (TRAVATAN) 0.004 % SOLN ophthalmic solution Place 1 drop into both eyes at bedtime.    . Turmeric (QC TUMERIC COMPLEX PO) Take 1 capsule by mouth daily.      No current facility-administered medications for this visit.    Allergies-reviewed and updated Allergies  Allergen Reactions  . Codeine Nausea Only  . Estrogens Conjugated Other (See Comments)    HA  . Paroxetine Hcl Other (See Comments)    Felt like she was "out of her head"    Social History   Socioeconomic History  . Marital status: Married    Spouse name: Legrand Como  . Number of children: 2  . Years of education: 54  . Highest  education level: Some college, no degree  Occupational History  . Occupation: Retired  Tobacco Use  . Smoking status: Former Research scientist (life sciences)  . Smokeless tobacco: Never Used  . Tobacco comment: Quit in 1987  Vaping Use  . Vaping Use: Never used  Substance and Sexual Activity  . Alcohol use: Yes    Alcohol/week: 12.0 standard drinks    Types: 12 Glasses of wine per week    Comment: wine or alcohol daily  . Drug use: No  . Sexual activity: Yes    Birth control/protection: Post-menopausal  Other Topics Concern  . Not on file  Social History Narrative   Patient is married Legrand Como) and lives with her husband.   Patient is retired.   Patient has two children.   Patient is right-handed.   Patient has a college education.   Patient drinks two cups of coffee daily.            Social Determinants of Health   Financial Resource Strain: Low Risk   . Difficulty of Paying Living Expenses: Not hard at all  Food Insecurity: No Food Insecurity  . Worried About Charity fundraiser in the Last Year: Never true  . Ran Out of Food in the Last Year: Never true  Transportation Needs: No Transportation Needs  . Lack of Transportation (Medical): No  . Lack of Transportation (Non-Medical): No  Physical Activity: Inactive  . Days of Exercise per Week: 0 days  . Minutes of Exercise per Session: 0 min  Stress: Stress Concern Present  . Feeling of Stress : Rather much  Social Connections: Socially Isolated  . Frequency of Communication with Friends and Family: Once a week  . Frequency of Social Gatherings with Friends and Family: Once a week  . Attends Religious Services: Never  . Active Member of Clubs or Organizations: No  . Attends Archivist Meetings: Never  . Marital Status: Married        Objective:  Physical Exam: BP 126/82   Pulse 60   Temp 97.9 F (36.6 C) (Temporal)   Ht 5\' 2"  (1.575 m)   Wt 176 lb (79.8 kg)   SpO2 98%   BMI 32.19 kg/m   Body mass index is 32.19  kg/m. Wt Readings from Last 3 Encounters:  11/12/20 176 lb (79.8  kg)  11/07/20 175 lb (79.4 kg)  09/12/20 171 lb (77.6 kg)   Gen: NAD, resting comfortably HEENT: TMs normal bilaterally. OP clear. No thyromegaly noted.  CV: RRR with no murmurs appreciated Pulm: NWOB, CTAB with no crackles, wheezes, or rhonchi GI: Normal bowel sounds present. Soft, Nontender, Nondistended. MSK: no edema, cyanosis, or clubbing noted Skin: warm, dry Neuro: CN2-12 grossly intact. Strength 5/5 in upper and lower extremities. Reflexes symmetric and intact bilaterally.  Psych: Normal affect and thought content     Gregrey Bloyd M. Jerline Pain, MD 11/12/2020 11:45 AM

## 2020-11-13 NOTE — Progress Notes (Signed)
Please inform patient of the following:  Labs are all stable. Do not need to make any changes to her treatment plan at this time. Would like for her to keep up the good work and we can recheck in a year or so.  Nicole Holt. Nicole Pain, MD 11/13/2020 8:32 AM

## 2020-11-14 DIAGNOSIS — M9901 Segmental and somatic dysfunction of cervical region: Secondary | ICD-10-CM | POA: Diagnosis not present

## 2020-11-14 DIAGNOSIS — M9903 Segmental and somatic dysfunction of lumbar region: Secondary | ICD-10-CM | POA: Diagnosis not present

## 2020-11-14 DIAGNOSIS — M62830 Muscle spasm of back: Secondary | ICD-10-CM | POA: Diagnosis not present

## 2020-11-14 DIAGNOSIS — M5032 Other cervical disc degeneration, mid-cervical region, unspecified level: Secondary | ICD-10-CM | POA: Diagnosis not present

## 2020-11-26 NOTE — Progress Notes (Signed)
[facility] Note  11/27/2020     CHIEF COMPLAINT Patient presents for Retina Follow Up   HISTORY OF PRESENT ILLNESS: Nicole Holt is a 80 y.o. female who presents to the clinic today for:   HPI    Retina Follow Up    Patient presents with  Other.  In right eye.  This started 4 weeks ago.  I, the attending physician,  performed the HPI with the patient and updated documentation appropriately.          Comments    Patient here for 4 weeks retina follow up for  retained lens material OD. Patient states vision doing good. No eye pain.       Last edited by Bernarda Caffey, MD on 11/27/2020  2:48 PM. (History)    Pt states she still occasionally sees a floater in her right eye  Referring physician: Vivi Barrack, MD [place],  [place] [id number]  HISTORICAL INFORMATION:   Selected notes from the MEDICAL RECORD NUMBER Referred by Dr. Venetia Maxon for retained lens material OD LEE:  Ocular Hx- PMH-    CURRENT MEDICATIONS: Current Outpatient Medications (Ophthalmic Drugs)  Medication Sig  . brimonidine (ALPHAGAN) 0.2 % ophthalmic solution Place 1 drop into the right eye 2 (two) times daily.  . brimonidine (ALPHAGAN) 0.15 % ophthalmic solution Place 1 drop into the right eye 2 (two) times daily.  . prednisoLONE acetate (PRED FORTE) 1 % ophthalmic suspension Place 1 drop into the right eye 6 (six) times daily.   . Travoprost, BAK Free, (TRAVATAN) 0.004 % SOLN ophthalmic solution Place 1 drop into both eyes at bedtime.   No current facility-administered medications for this visit. (Ophthalmic Drugs)   Current Outpatient Medications (Other)  Medication Sig  . acetaZOLAMIDE (DIAMOX SEQUELS) 500 MG capsule Take 1 capsule (500 mg total) by mouth 2 (two) times daily.  . calcium carbonate (TUMS - DOSED IN MG ELEMENTAL CALCIUM) 500 MG chewable tablet Chew 1-2 tablets by mouth 3 (three) times daily as needed for indigestion or heartburn.  . Calcium  Carbonate-Vit D-Min (QC CALCIUM-MAGNESIUM-ZINC-D3 PO) Take 1 tablet by mouth once a week.  . diazepam (VALIUM) 5 MG tablet Take 1 tablet (5 mg total) by mouth every 6 (six) hours as needed. for anxiety (Patient taking differently: Take 5 mg by mouth every 6 (six) hours as needed (panic attacks.).)  . fluticasone (FLONASE) 50 MCG/ACT nasal spray Use 2 spray(s) in each nostril once daily (Patient taking differently: Place 2 sprays into both nostrils daily as needed for allergies.)  . gabapentin (NEURONTIN) 100 MG capsule Take 1-3 capsules (100-300 mg total) by mouth 3 (three) times daily as needed (nerve pain).  . melatonin 3 MG TABS tablet Take 3 mg by mouth at bedtime as needed (sleep).  . Omega-3 Fatty Acids (FISH OIL PO) Take 1 capsule by mouth every 14 (fourteen) days.  . polyethylene glycol (MIRALAX / GLYCOLAX) 17 g packet Take 17 g by mouth daily.  . Potassium 99 MG TABS Take 99 mg by mouth 2 (two) times a week.  . Turmeric (QC TUMERIC COMPLEX PO) Take 1 capsule by mouth daily.    No current facility-administered medications for this visit. (Other)      REVIEW OF SYSTEMS: ROS    Positive for: Neurological, Musculoskeletal, HENT, Eyes, Respiratory   Negative for: Constitutional, Gastrointestinal, Skin, Genitourinary, Endocrine, Cardiovascular, Psychiatric, Allergic/Imm, Heme/Lymph   Last edited by Theodore Demark, COA on 11/27/2020  2:05 PM. (History)       ALLERGIES Allergies  Allergen Reactions  . Codeine Nausea Only  . Estrogens Conjugated Other (See Comments)    HA  . Paroxetine Hcl Other (See Comments)    Felt like she was "out of her head"    PAST MEDICAL HISTORY Past Medical History:  Diagnosis Date  . Allergy   . Anxiety    panic attack  . Depression   . GERD (gastroesophageal reflux disease)   . Glaucoma   . Glaucoma   . Hearing loss   . 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 Hyperlipidemia   . Hypertensive retinopathy    OU  .  Migraines    ocular  . OSA on CPAP 02/02/2014   PSG 09-27-2012 RDI 18.9 and AHi 9.8, Epworth 15 .titrated to only 5 cm water.   . Osteoporosis   . Sleep apnea   . Tinnitus    Past Surgical History:  Procedure Laterality Date  . AIR/FLUID EXCHANGE Right 09/12/2020   Procedure: AIR/FLUID EXCHANGE;  Surgeon: Bernarda Caffey, MD;  Location: [facility];  Service: Ophthalmology;  Laterality: Right;  . CATARACT EXTRACTION Bilateral OD: 10.25.21 OS: 12.2015   Dr. Venetia Maxon  . cataract surgery    . COSMETIC SURGERY    . EYE SURGERY Bilateral    Cat Sx - Dr. Venetia Maxon  . EYE SURGERY Right 09/12/2020   PPV/EL for retained lens material - Dr. Bernarda Caffey  . PARS PLANA VITRECTOMY Right 09/12/2020   Procedure: PARS PLANA VITRECTOMY WITH 23 GAUGE;  Surgeon: Bernarda Caffey, MD;  Location: [facility];  Service: Ophthalmology;  Laterality: Right;  . PHOTOCOAGULATION WITH LASER Right 09/12/2020   Procedure: PHOTOCOAGULATION WITH LASER;  Surgeon: Bernarda Caffey, MD;  Location: [facility];  Service: Ophthalmology;  Laterality: Right;  . REMOVAL RETAINED LENS Right 09/12/2020   Procedure: REMOVAL RETAINED LENS;  Surgeon: Bernarda Caffey, MD;  Location: [facility];  Service: Ophthalmology;  Laterality: Right;    FAMILY HISTORY Family History  Problem Relation Age of Onset  . Osteoporosis Mother   . Stroke Mother   . Hyperlipidemia Mother   . Hypertension Mother   . Kidney disease Father   . Congestive Heart Failure Father   . Heart disease Father   . Hyperlipidemia Father   . Stroke Sister   . Parkinson's disease Sister   . Heart disease Sister   . Heart disease Maternal Grandmother   . Gout Maternal Grandmother   . Stroke Maternal Grandfather   . Stroke Paternal Grandmother   . Thrombosis Paternal Grandfather   . Early death Paternal Grandfather   . Hypertension Brother   . Diabetes Brother   . Arthritis/Rheumatoid Other   . Parkinson's disease Other   . Heart disease Other   . Obesity Daughter   . Breast cancer  Neg Hx     SOCIAL HISTORY Social History   Tobacco Use  . Smoking status: Former Research scientist (life sciences)  . Smokeless tobacco: Never Used  . Tobacco comment: Quit in 1987  Vaping Use  . Vaping Use: Never used  Substance Use Topics  . Alcohol use: Yes    Alcohol/week: 12.0 standard drinks    Types: 12 Glasses of wine per week    Comment: wine or alcohol daily  . Drug use: No         OPHTHALMIC EXAM:  Base Eye Exam    Visual Acuity (Snellen - Linear)  Right Left   Dist [place] 20/20 20/50 -2   Dist ph [place]  20/25       Tonometry (Tonopen, 2:02 PM)      Right Left   Pressure 17 18       Pupils      Dark Light Shape React APD   Right 6 6 Round Minimal None   Left 3 2 Round Brisk None       Visual Fields (Counting fingers)      Left Right    Full Full       Extraocular Movement      Right Left    Full, Ortho Full, Ortho       Neuro/Psych    Oriented x3: Yes   Mood/Affect: Normal       Dilation    Both eyes: 1.0% Mydriacyl, 2.5% Phenylephrine @ 2:02 PM        Slit Lamp and Fundus Exam    Slit Lamp Exam      Right Left   Lids/Lashes Dermatochalasis - upper lid Dermatochalasis - upper lid   Conjunctiva/Sclera White and quiet White and quiet   Cornea 1-2+Punctate epithelial erosions, well healed temporal cataract wound, mild arcus 1+Punctate epithelial erosions, well healed temporal cataract wounds   Anterior Chamber Deep, 0.5+ Cell/pigment Deep and quiet, Deep   Iris Round and dilated, mild iridodenesis Round and dilated   Lens 3 piece sulcus IOL in good position, open PC, trace residual cortical remnants at 1100 and 0630 -- significantly improved and almost resolved PC IOL in good position with trace PCO non-central   Vitreous post vitrectomy, small cortical remnant fragment settled inferiorly -- resolved, trace vitreous condensations syneresis       Fundus Exam      Right Left   Disc mild tilt, mild pallor, +cupping, mild PPP Pink and Sharp, mild tilt, IT PPA   C/D  Ratio 0.6 0.5   Macula Flat, Blunted foveal reflex, mild RPE mottling, No heme or edema Flat, Blunted foveal reflex, mild RPE mottling, No heme or edema   Vessels attenuated, Tortuous attenuated, Tortuous   Periphery attached, retained lens material gone; 360 peripheral laser changes Attached             IMAGING AND PROCEDURES  Imaging and Procedures for 11/27/2020  OCT, Retina - OU - Both Eyes       Right Eye Quality was good. Central Foveal Thickness: 292. Progression has been stable. Findings include normal foveal contour, no IRF, no SRF, retinal drusen  (stable improvement in vitreous opacities).   Left Eye Quality was good. Central Foveal Thickness: 283. Progression has been stable. Findings include normal foveal contour, no IRF, no SRF.   Notes *Images captured and stored on drive  Diagnosis / Impression:  NFP, no IRF/SRF OU OD: stable improvement in vitreous opacities  Clinical management:  See below  Abbreviations: NFP - Normal foveal profile. CME - cystoid macular edema. PED - pigment epithelial detachment. IRF - intraretinal fluid. SRF - subretinal fluid. EZ - ellipsoid zone. ERM - epiretinal membrane. ORA - outer retinal atrophy. ORT - outer retinal tubulation. SRHM - subretinal hyper-reflective material. IRHM - intraretinal hyper-reflective material                ASSESSMENT/PLAN:    ICD-10-CM   1. Retained lens material following cataract surgery of right eye  H59.021   2. Retinal edema  H35.81 OCT, Retina - OU - Both Eyes  3. Essential hypertension  I10   4. Hypertensive retinopathy of both eyes  H35.033   5. Pseudophakia, both eyes  Z96.1     1. Retained lens material OD  - s/p CE, sulcus IOL on Monday 10.25.21  - broken posterior capsule, nuclear and cortical lens fragments in vitreous  - had IOP spike to 40 on 11.3.21  - improved post drops, AC tap and diamox by Dr. Venetia Maxon  - persistently elevated IOP and microcystic corneal edema  - s/p  PPV/EL OD 11.11.21 for removal of retained lens material             - doing well  - VA 20/20 OD -- stable             - IOP good at 17  - trace residual cortical remnants at 1100 and 0630 -- almost completely resolved             - start PF taper OD -- 4,3,2,1 drops daily, decrease weekly  - continue Brimonidine BID OD  - continue ATs 2-3x/day OD -- pt states she hasn't been using             - post op drop and positioning instructions reviewed             - tylenol/ibuprofen for pain  - f/u 6 weeks, POV, DFE, OCT  2. No retinal edema on exam or OCT  3,4. Hypertensive retinopathy OU - discussed importance of tight BP control - monitor  5. Pseudophakia OU  - s/p CE/IOL (Dr. Venetia Maxon, OD: 10.25.21, OS: 12.2015)  - IOLs in good position  - retained lens material OD as above - monitor  Ophthalmic Meds Ordered this visit:  Meds ordered this encounter  Medications  . brimonidine (ALPHAGAN) 0.2 % ophthalmic solution    Sig: Place 1 drop into the right eye 2 (two) times daily.    Dispense:  15 mL    Refill:  3       Return in about 6 weeks (around 01/08/2021) for f/u retained lens material OD, DFE, OCT.  There are no Patient Instructions on file for this visit.   Explained the diagnoses, plan, and follow up with the patient and they expressed understanding.  Patient expressed understanding of the importance of proper follow up care.   This document serves as a record of services personally performed by Gardiner Sleeper, MD, PhD. It was created on their behalf by Roselee Nova, COMT. The creation of this record is the provider's dictation and/or activities during the visit.  Electronically signed by: Roselee Nova, COMT 12/01/20 12:45 AM   Gardiner Sleeper, M.D., Ph.D. Diseases & Surgery of the Retina and Vitreous [facility]  I have reviewed the above documentation for accuracy and completeness, and I agree with the above. Gardiner Sleeper, M.D., Ph.D.  12/01/20 12:45 AM  Abbreviations: M myopia (nearsighted); A astigmatism; H hyperopia (farsighted); P presbyopia; Mrx spectacle prescription;  CTL contact lenses; OD right eye; OS left eye; OU both eyes  XT exotropia; ET esotropia; PEK punctate epithelial keratitis; PEE punctate epithelial erosions; DES dry eye syndrome; MGD meibomian gland dysfunction; ATs artificial tears; PFAT's preservative free artificial tears; [place] nuclear sclerotic cataract; PSC posterior subcapsular cataract; ERM epi-retinal membrane; PVD posterior vitreous detachment; RD retinal detachment; DM diabetes mellitus; DR diabetic retinopathy; NPDR non-proliferative diabetic retinopathy; PDR proliferative diabetic retinopathy; CSME clinically significant macular edema; DME diabetic macular edema; dbh dot blot hemorrhages; CWS cotton wool spot; POAG primary  open angle glaucoma; C/D cup-to-disc ratio; HVF humphrey visual field; GVF goldmann visual field; OCT optical coherence tomography; IOP intraocular pressure; BRVO Branch retinal vein occlusion; CRVO central retinal vein occlusion; CRAO central retinal artery occlusion; BRAO branch retinal artery occlusion; RT retinal tear; SB scleral buckle; PPV pars plana vitrectomy; VH Vitreous hemorrhage; PRP panretinal laser photocoagulation; IVK intravitreal kenalog; VMT vitreomacular traction; MH Macular hole;  NVD neovascularization of the disc; NVE neovascularization elsewhere; AREDS age related eye disease study; ARMD age related macular degeneration; POAG primary open angle glaucoma; EBMD epithelial/anterior basement membrane dystrophy; ACIOL anterior chamber intraocular lens; IOL intraocular lens; PCIOL posterior chamber intraocular lens; Phaco/IOL phacoemulsification with intraocular lens placement; [place] photorefractive keratectomy; LASIK laser assisted in situ keratomileusis; HTN hypertension; DM diabetes mellitus; COPD chronic obstructive pulmonary disease

## 2020-11-27 ENCOUNTER — Ambulatory Visit (INDEPENDENT_AMBULATORY_CARE_PROVIDER_SITE_OTHER): Payer: Medicare HMO | Admitting: Ophthalmology

## 2020-11-27 ENCOUNTER — Encounter (INDEPENDENT_AMBULATORY_CARE_PROVIDER_SITE_OTHER): Payer: Self-pay | Admitting: Ophthalmology

## 2020-11-27 ENCOUNTER — Other Ambulatory Visit: Payer: Self-pay

## 2020-11-27 DIAGNOSIS — Z961 Presence of intraocular lens: Secondary | ICD-10-CM

## 2020-11-27 DIAGNOSIS — H3581 Retinal edema: Secondary | ICD-10-CM

## 2020-11-27 DIAGNOSIS — H59021 Cataract (lens) fragments in eye following cataract surgery, right eye: Secondary | ICD-10-CM

## 2020-11-27 DIAGNOSIS — I10 Essential (primary) hypertension: Secondary | ICD-10-CM | POA: Diagnosis not present

## 2020-11-27 DIAGNOSIS — H35033 Hypertensive retinopathy, bilateral: Secondary | ICD-10-CM

## 2020-11-27 MED ORDER — BRIMONIDINE TARTRATE 0.2 % OP SOLN: 1.0000 [drp] | Freq: Two times a day (BID) | OPHTHALMIC | 3 refills | Status: DC

## 2021-01-01 ENCOUNTER — Telehealth: Payer: Self-pay

## 2021-01-01 DIAGNOSIS — G4733 Obstructive sleep apnea (adult) (pediatric): Secondary | ICD-10-CM | POA: Diagnosis not present

## 2021-01-01 NOTE — Chronic Care Management (AMB) (Signed)
Chronic Care Management Pharmacy Assistant   Name: Nicole Holt  MRN: [id number] DOB: 21-Apr-1941  Reason for Encounter: Disease State/ General Adherence Call  PCP : Vivi Barrack, MD  Allergies:   Allergies  Allergen Reactions  . Codeine Nausea Only  . Estrogens Conjugated Other (See Comments)    HA  . Paroxetine Hcl Other (See Comments)    Felt like she was "out of her head"    Medications: Outpatient Encounter Medications as of 01/01/2021  Medication Sig Note  . acetaZOLAMIDE (DIAMOX SEQUELS) 500 MG capsule Take 1 capsule (500 mg total) by mouth 2 (two) times daily.   . brimonidine (ALPHAGAN) 0.15 % ophthalmic solution Place 1 drop into the right eye 2 (two) times daily.   . brimonidine (ALPHAGAN) 0.2 % ophthalmic solution Place 1 drop into the right eye 2 (two) times daily.   . calcium carbonate (TUMS - DOSED IN MG ELEMENTAL CALCIUM) 500 MG chewable tablet Chew 1-2 tablets by mouth 3 (three) times daily as needed for indigestion or heartburn.   . Calcium Carbonate-Vit D-Min (QC CALCIUM-MAGNESIUM-ZINC-D3 PO) Take 1 tablet by mouth once a week.   . diazepam (VALIUM) 5 MG tablet Take 1 tablet (5 mg total) by mouth every 6 (six) hours as needed. for anxiety (Patient taking differently: Take 5 mg by mouth every 6 (six) hours as needed (panic attacks.).)   . fluticasone (FLONASE) 50 MCG/ACT nasal spray Use 2 spray(s) in each nostril once daily (Patient taking differently: Place 2 sprays into both nostrils daily as needed for allergies.)   . gabapentin (NEURONTIN) 100 MG capsule Take 1-3 capsules (100-300 mg total) by mouth 3 (three) times daily as needed (nerve pain).   . melatonin 3 MG TABS tablet Take 3 mg by mouth at bedtime as needed (sleep).   . Omega-3 Fatty Acids (FISH OIL PO) Take 1 capsule by mouth every 14 (fourteen) days. 09/11/2020: [place]   . polyethylene glycol (MIRALAX / GLYCOLAX) 17 g packet Take 17 g by mouth daily.   . Potassium 99 MG TABS Take 99 mg by mouth 2  (two) times a week.   . prednisoLONE acetate (PRED FORTE) 1 % ophthalmic suspension Place 1 drop into the right eye 6 (six) times daily.    . Travoprost, BAK Free, (TRAVATAN) 0.004 % SOLN ophthalmic solution Place 1 drop into both eyes at bedtime.   . Turmeric (QC TUMERIC COMPLEX PO) Take 1 capsule by mouth daily.     No facility-administered encounter medications on file as of 01/01/2021.    Current Diagnosis: Patient Active Problem List   Diagnosis Date Noted  . 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 PND (post-nasal drip) 10/30/2019  . Sleep related choking sensation 10/30/2019  . Insomnia 08/31/2019  . Allergic rhinitis 04/24/2019  . Hyperglycemia 10/19/2018  . Constipation 10/14/2018  . Rectal bleeding 10/14/2018  . Dyslipidemia 05/24/2018  . Abnormal mammogram of right breast 09/22/2016  . Rosacea 09/18/2015  . S/P cataract surgery 09/18/2015  . OSA on CPAP 02/02/2014  . Tremor, essential 02/02/2014  . UARS (upper airway resistance syndrome) 02/02/2014  . Osteoporosis 07/28/2012  . Glaucoma 07/28/2012  . Restless leg syndrome 07/28/2012  . Anxiety 07/28/2012  . Benign positional vertigo 07/28/2012  . Tinnitus 07/27/2012  . Hearing loss 07/27/2012  . Basal cell cancer 07/27/2012    Reviewed chart for medication changes ahead of medication coordination call.  09/10/2020 OV PCP Dr. Jerline Pain; surgical clearance for retained lens material  09/12/2020 Procedure  Dr. Coralyn Pear; Pars plana Virectomy with 23 gauge (right) removal retained lens.  09/13/2020 OV (ophthalmology) Dr. Coralyn Pear; start PF 4x/day OD, Zymaxid QID OD, Brimonidine BID OD, Atropine BID OD, PSO ung QID OD. Stop PO diamox, use eye shield when sleeping, tylenol/ibuprofen for pain, f/u 1 week  09/18/2020 OV (ophthalmology) Dr. Coralyn Pear; cont PF 6x/day, Zymaxid QID OD - stop on 11/20, cont Brimonidinee BID OD, okay to stop Atrophine, decrease PSO uno to QHS/PRN on 11/20, start ATs QID OD, eye shield when sleeping,  f/u 2-3 weeks  1201/2021 OV (ophthalmology) Dr. Coralyn Pear; cortical remnants in the bag disintegrating and small piece in vit -- should continue to disintegrate and resolve.  cont PF 6x/day OD, Brimonidine BID OD, PSO ung QHS/PRN OD -- replace with OTC gel,  start   ATs QID OD, follow up 4 weeks  10/30/2020 OV (ophthalmology) Dr. Coralyn Pear; cortical remnants in the bag shrinking/disintegrating; tr pieces in vit -- should continue to disintegrate and resolve, cont PF 6x/day OD , Brimonidine BID OD OTC gel, continue ATs 2-3x/day OD -- pt states she hasn't been using, follow up 4 weeks  11/12/2020 OV PCP Dr. Jerline Pain; chronic f/u, chronic conditions stable, no medication changes indicated, follow up 1 year.  Have you had any problems recently with your health? Patient states she has had many problems since July of 2021. Patient states "it's been hell". Patient states she has had to have two eye surgeries and has had some back problems around the same time. Patient states her back pain has improved some and she is finally able to sleep at night with the help of her Diazepam. 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  Have you had any problems with your pharmacy? Patient states she has not had any problems recently with her pharmacy.  What issues or side effects are you having with your medications? Patent states she has not had any issues or side effects from any of her medications.  What would you like me to pass along to Madelin Rear, CPP for him to help you with?  Patient states she does not have anything to pass along at this time.  What can we do to take care of you better? Patient states we are doing a great job so far and she appreciates [place] calling to check on her.  Future Appointments  Date Time Provider [place]  01/08/2021  2:00 PM Bernarda Caffey, MD [facility] None  02/24/2021  1:00 PM [facility] CCM PHARMACIST  [facility]  11/13/2021 11:00 AM [facility] HEALTH COACH [facility]  11/14/2021 11:00 AM Vivi Barrack, MD [facility]    April D Calhoun, [place] Pharmacist Assistant [phone]    Follow-Up:  Pharmacist Review

## 2021-01-07 NOTE — Progress Notes (Addendum)
[facility] Note  01/08/2021     CHIEF COMPLAINT Patient presents for Retina Follow Up   HISTORY OF PRESENT ILLNESS: Nicole Holt is a 80 y.o. female who presents to the clinic today for:   HPI    Retina Follow Up    Patient presents with  Other.  In right eye.  This started 6 weeks ago.  I, the attending physician,  performed the HPI with the patient and updated documentation appropriately.          Comments    Patient here for 6 weeks retina follow up for retained lens material OD. Patient states vision  doing good. No eye pain.        Last edited by Bernarda Caffey, MD on 01/08/2021  5:10 PM. (History)    Pt states her vision is doing well, she states every time she thinks the floaters are gone, a new one pops up  Referring physician: Marylynn Pearson, MD [place] [address],  [place] [address]  HISTORICAL INFORMATION:   Selected notes from the MEDICAL RECORD NUMBER Referred by Dr. Venetia Maxon for retained lens material OD LEE:  Ocular Hx- PMH-    CURRENT MEDICATIONS: Current Outpatient Medications (Ophthalmic Drugs)  Medication Sig  . brimonidine (ALPHAGAN) 0.15 % ophthalmic solution Place 1 drop into the right eye 2 (two) times daily.  . brimonidine (ALPHAGAN) 0.2 % ophthalmic solution Place 1 drop into the right eye 2 (two) times daily.  . prednisoLONE acetate (PRED FORTE) 1 % ophthalmic suspension Place 1 drop into the right eye 6 (six) times daily.   . Travoprost, BAK Free, (TRAVATAN) 0.004 % SOLN ophthalmic solution Place 1 drop into both eyes at bedtime.   No current facility-administered medications for this visit. (Ophthalmic Drugs)   Current Outpatient Medications (Other)  Medication Sig  . acetaZOLAMIDE (DIAMOX SEQUELS) 500 MG capsule Take 1 capsule (500 mg total) by mouth 2 (two) times daily.  . calcium carbonate (TUMS - DOSED IN MG ELEMENTAL CALCIUM) 500 MG chewable tablet Chew 1-2 tablets by mouth 3 (three) times daily  as needed for indigestion or heartburn.  . Calcium Carbonate-Vit D-Min (QC CALCIUM-MAGNESIUM-ZINC-D3 PO) Take 1 tablet by mouth once a week.  . diazepam (VALIUM) 5 MG tablet Take 1 tablet (5 mg total) by mouth every 6 (six) hours as needed. for anxiety (Patient taking differently: Take 5 mg by mouth every 6 (six) hours as needed (panic attacks.).)  . fluticasone (FLONASE) 50 MCG/ACT nasal spray Use 2 spray(s) in each nostril once daily (Patient taking differently: Place 2 sprays into both nostrils daily as needed for allergies.)  . gabapentin (NEURONTIN) 100 MG capsule Take 1-3 capsules (100-300 mg total) by mouth 3 (three) times daily as needed (nerve pain).  . melatonin 3 MG TABS tablet Take 3 mg by mouth at bedtime as needed (sleep).  . Omega-3 Fatty Acids (FISH OIL PO) Take 1 capsule by mouth every 14 (fourteen) days.  . polyethylene glycol (MIRALAX / GLYCOLAX) 17 g packet Take 17 g by mouth daily.  . Potassium 99 MG TABS Take 99 mg by mouth 2 (two) times a week.  . Turmeric (QC TUMERIC COMPLEX PO) Take 1 capsule by mouth daily.    No current facility-administered medications for this visit. (Other)      REVIEW OF SYSTEMS: ROS    Positive for: Neurological, Musculoskeletal, HENT, Eyes, Respiratory   Negative for: Constitutional, Gastrointestinal, Skin, Genitourinary, Endocrine, Cardiovascular, Psychiatric, Allergic/Imm,  Heme/Lymph   Last edited by Theodore Demark, COA on 01/08/2021  2:26 PM. (History)       ALLERGIES Allergies  Allergen Reactions  . Codeine Nausea Only  . Estrogens Conjugated Other (See Comments)    HA  . Paroxetine Hcl Other (See Comments)    Felt like she was "out of her head"    PAST MEDICAL HISTORY Past Medical History:  Diagnosis Date  . Allergy   . Anxiety    panic attack  . Depression   . GERD (gastroesophageal reflux disease)   . Glaucoma   . Glaucoma   . Hearing loss   . 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  Hyperlipidemia   . Hypertensive retinopathy    OU  . Migraines    ocular  . OSA on CPAP 02/02/2014   PSG 09-27-2012 RDI 18.9 and AHi 9.8, Epworth 15 .titrated to only 5 cm water.   . Osteoporosis   . Sleep apnea   . Tinnitus    Past Surgical History:  Procedure Laterality Date  . AIR/FLUID EXCHANGE Right 09/12/2020   Procedure: AIR/FLUID EXCHANGE;  Surgeon: Bernarda Caffey, MD;  Location: [facility];  Service: Ophthalmology;  Laterality: Right;  . CATARACT EXTRACTION Bilateral OD: 10.25.21 OS: 12.2015   Dr. Venetia Maxon  . cataract surgery    . COSMETIC SURGERY    . EYE SURGERY Bilateral    Cat Sx - Dr. Venetia Maxon  . EYE SURGERY Right 09/12/2020   PPV/EL for retained lens material - Dr. Bernarda Caffey  . PARS PLANA VITRECTOMY Right 09/12/2020   Procedure: PARS PLANA VITRECTOMY WITH 23 GAUGE;  Surgeon: Bernarda Caffey, MD;  Location: [facility];  Service: Ophthalmology;  Laterality: Right;  . PHOTOCOAGULATION WITH LASER Right 09/12/2020   Procedure: PHOTOCOAGULATION WITH LASER;  Surgeon: Bernarda Caffey, MD;  Location: [facility];  Service: Ophthalmology;  Laterality: Right;  . REMOVAL RETAINED LENS Right 09/12/2020   Procedure: REMOVAL RETAINED LENS;  Surgeon: Bernarda Caffey, MD;  Location: [facility];  Service: Ophthalmology;  Laterality: Right;    FAMILY HISTORY Family History  Problem Relation Age of Onset  . Osteoporosis Mother   . Stroke Mother   . Hyperlipidemia Mother   . Hypertension Mother   . Kidney disease Father   . Congestive Heart Failure Father   . Heart disease Father   . Hyperlipidemia Father   . Stroke Sister   . Parkinson's disease Sister   . Heart disease Sister   . Heart disease Maternal Grandmother   . Gout Maternal Grandmother   . Stroke Maternal Grandfather   . Stroke Paternal Grandmother   . Thrombosis Paternal Grandfather   . Early death Paternal Grandfather   . Hypertension Brother   . Diabetes Brother   . Arthritis/Rheumatoid Other   . Parkinson's disease Other   . Heart  disease Other   . Obesity Daughter   . Breast cancer Neg Hx     SOCIAL HISTORY Social History   Tobacco Use  . Smoking status: Former Research scientist (life sciences)  . Smokeless tobacco: Never Used  . Tobacco comment: Quit in 1987  Vaping Use  . Vaping Use: Never used  Substance Use Topics  . Alcohol use: Yes    Alcohol/week: 12.0 standard drinks    Types: 12 Glasses of wine per week    Comment: wine or alcohol daily  . Drug use: No         OPHTHALMIC EXAM:  Base Eye Exam  Visual Acuity (Snellen - Linear)      Right Left   Dist [place] 20/20 -1 20/50 -1   Dist ph [place]  20/20       Tonometry (Tonopen, 2:23 PM)      Right Left   Pressure 20 16       Pupils      Dark Light Shape React APD   Right 5 5 Round Minimal None   Left 3 2 Round Brisk None       Visual Fields (Counting fingers)      Left Right    Full Full       Extraocular Movement      Right Left    Full, Ortho Full, Ortho       Neuro/Psych    Oriented x3: Yes   Mood/Affect: Normal       Dilation    Both eyes: 1.0% Mydriacyl, 2.5% Phenylephrine @ 2:23 PM        Slit Lamp and Fundus Exam    Slit Lamp Exam      Right Left   Lids/Lashes Dermatochalasis - upper lid Dermatochalasis - upper lid   Conjunctiva/Sclera 1-2+ Injection White and quiet   Cornea Trace Punctate epithelial erosions, well healed temporal cataract wound, mild arcus 1+Punctate epithelial erosions, well healed temporal cataract wounds   Anterior Chamber Deep and quiet Deep and quiet, Deep   Iris Round and dilated, mild iridodenesis Round and dilated   Lens 3 piece sulcus IOL in good position, open PC, trace residual cortical remnants at 0630 -- almost resolved PC IOL in good position with trace PCO non-central   Vitreous post vitrectomy, small cortical remnant fragment settled inferiorly -- resolved, trace vitreous condensations syneresis       Fundus Exam      Right Left   Disc mild tilt, mild pallor, +cupping, mild PPP, Sharp rim Pink and Sharp,  mild tilt, IT PPA   C/D Ratio 0.65 0.5   Macula Flat, Blunted foveal reflex, mild RPE mottling, No heme or edema Flat, Blunted foveal reflex, mild RPE mottling, No heme or edema   Vessels attenuated, Tortuous attenuated, Tortuous   Periphery attached, retained lens material gone; 360 peripheral laser changes Attached             IMAGING AND PROCEDURES  Imaging and Procedures for 01/08/2021  OCT, Retina - OU - Both Eyes       Right Eye Quality was good. Central Foveal Thickness: 294. Progression has been stable. Findings include normal foveal contour, no IRF, no SRF, retinal drusen  (stable improvement in vitreous opacities).   Left Eye Quality was good. Central Foveal Thickness: 283. Progression has been stable. Findings include normal foveal contour, no IRF, no SRF.   Notes *Images captured and stored on drive  Diagnosis / Impression:  NFP, no IRF/SRF OU OD: stable improvement in vitreous opacities  Clinical management:  See below  Abbreviations: NFP - Normal foveal profile. CME - cystoid macular edema. PED - pigment epithelial detachment. IRF - intraretinal fluid. SRF - subretinal fluid. EZ - ellipsoid zone. ERM - epiretinal membrane. ORA - outer retinal atrophy. ORT - outer retinal tubulation. SRHM - subretinal hyper-reflective material. IRHM - intraretinal hyper-reflective material                ASSESSMENT/PLAN:    ICD-10-CM   1. Retained lens material following cataract surgery of right eye  H59.021   2. Retinal edema  H35.81 OCT, Retina - OU -  Both Eyes  3. Essential hypertension  I10   4. Hypertensive retinopathy of both eyes  H35.033   5. Pseudophakia, both eyes  Z96.1     1. Retained lens material OD  - s/p CE, sulcus IOL on Monday 10.25.21  - broken posterior capsule, nuclear and cortical lens fragments in vitreous  - had IOP spike to 40 on 11.3.21  - improved post drops, AC tap and diamox by Dr. Venetia Maxon  - persistently elevated IOP and microcystic  corneal edema  - s/p PPV/EL OD 11.11.21 for removal of retained lens material             - doing well  - VA 20/20 OD -- stable             - IOP good at 20  - trace residual cortical remnants at 0630 -- almost completely resolved             - completed PF taper OD   - Brimonidine BID OD -- okay to switch back to latanoprost qhs OD to match OS  - continue ATs 2-3x/day OD -- pt states she hasn't been using             - post op drop instructions reviewed  - f/u 6 weeks, POV, DFE, OCT  2. No retinal edema on exam or OCT  3,4. Hypertensive retinopathy OU - discussed importance of tight BP control - monitor  5. Pseudophakia OU  - s/p CE/IOL (Dr. Venetia Maxon, OD: 10.25.21, OS: 12.2015)  - IOLs in good position  - retained lens material OD as above - monitor  Ophthalmic Meds Ordered this visit:  No orders of the defined types were placed in this encounter.      Return in about 6 weeks (around 02/19/2021) for f/u retained lens material OD, DFE, OCT.  There are no Patient Instructions on file for this visit.   Explained the diagnoses, plan, and follow up with the patient and they expressed understanding.  Patient expressed understanding of the importance of proper follow up care.   This document serves as a record of services personally performed by Gardiner Sleeper, MD, PhD. It was created on their behalf by Roselee Nova, COMT. The creation of this record is the provider's dictation and/or activities during the visit.  Electronically signed by: Roselee Nova, COMT 01/08/21 5:12 PM   This document serves as a record of services personally performed by Gardiner Sleeper, MD, PhD. It was created on their behalf by San Jetty. Owens Shark, OA an ophthalmic technician. The creation of this record is the provider's dictation and/or activities during the visit.    Electronically signed by: San Jetty. Owens Shark, [place] 03.09.2022 5:12 PM   Gardiner Sleeper, M.D., Ph.D. Diseases & Surgery of the Retina and  Vitreous [facility]  I have reviewed the above documentation for accuracy and completeness, and I agree with the above. Gardiner Sleeper, M.D., Ph.D. 01/08/21 5:12 PM   Abbreviations: M myopia (nearsighted); A astigmatism; H hyperopia (farsighted); P presbyopia; Mrx spectacle prescription;  CTL contact lenses; OD right eye; OS left eye; OU both eyes  XT exotropia; ET esotropia; PEK punctate epithelial keratitis; PEE punctate epithelial erosions; DES dry eye syndrome; MGD meibomian gland dysfunction; ATs artificial tears; PFAT's preservative free artificial tears; [place] nuclear sclerotic cataract; PSC posterior subcapsular cataract; ERM epi-retinal membrane; PVD posterior vitreous detachment; RD retinal detachment; DM diabetes mellitus; DR diabetic retinopathy; NPDR non-proliferative diabetic retinopathy; PDR proliferative  diabetic retinopathy; CSME clinically significant macular edema; DME diabetic macular edema; dbh dot blot hemorrhages; CWS cotton wool spot; POAG primary open angle glaucoma; C/D cup-to-disc ratio; HVF humphrey visual field; GVF goldmann visual field; OCT optical coherence tomography; IOP intraocular pressure; BRVO Branch retinal vein occlusion; CRVO central retinal vein occlusion; CRAO central retinal artery occlusion; BRAO branch retinal artery occlusion; RT retinal tear; SB scleral buckle; PPV pars plana vitrectomy; VH Vitreous hemorrhage; PRP panretinal laser photocoagulation; IVK intravitreal kenalog; VMT vitreomacular traction; MH Macular hole;  NVD neovascularization of the disc; NVE neovascularization elsewhere; AREDS age related eye disease study; ARMD age related macular degeneration; POAG primary open angle glaucoma; EBMD epithelial/anterior basement membrane dystrophy; ACIOL anterior chamber intraocular lens; IOL intraocular lens; PCIOL posterior chamber intraocular lens; Phaco/IOL phacoemulsification with intraocular lens placement; [place] photorefractive  keratectomy; LASIK laser assisted in situ keratomileusis; HTN hypertension; DM diabetes mellitus; COPD chronic obstructive pulmonary disease

## 2021-01-08 ENCOUNTER — Encounter (INDEPENDENT_AMBULATORY_CARE_PROVIDER_SITE_OTHER): Payer: Self-pay | Admitting: Ophthalmology

## 2021-01-08 ENCOUNTER — Ambulatory Visit (INDEPENDENT_AMBULATORY_CARE_PROVIDER_SITE_OTHER): Payer: Medicare HMO | Admitting: Ophthalmology

## 2021-01-08 ENCOUNTER — Other Ambulatory Visit: Payer: Self-pay

## 2021-01-08 DIAGNOSIS — Z961 Presence of intraocular lens: Secondary | ICD-10-CM

## 2021-01-08 DIAGNOSIS — H35033 Hypertensive retinopathy, bilateral: Secondary | ICD-10-CM

## 2021-01-08 DIAGNOSIS — H59021 Cataract (lens) fragments in eye following cataract surgery, right eye: Secondary | ICD-10-CM | POA: Diagnosis not present

## 2021-01-08 DIAGNOSIS — I10 Essential (primary) hypertension: Secondary | ICD-10-CM | POA: Diagnosis not present

## 2021-01-08 DIAGNOSIS — H3581 Retinal edema: Secondary | ICD-10-CM

## 2021-01-19 ENCOUNTER — Other Ambulatory Visit: Payer: Self-pay | Admitting: Family Medicine

## 2021-01-19 DIAGNOSIS — F419 Anxiety disorder, unspecified: Secondary | ICD-10-CM

## 2021-01-19 DIAGNOSIS — J3489 Other specified disorders of nose and nasal sinuses: Secondary | ICD-10-CM

## 2021-01-20 NOTE — Telephone Encounter (Signed)
Requesting refills

## 2021-02-14 NOTE — Progress Notes (Signed)
[facility] Note  02/19/2021     CHIEF COMPLAINT Patient presents for Retina Follow Up   HISTORY OF PRESENT ILLNESS: Nicole Holt is a 80 y.o. female who presents to the clinic today for:   HPI    Retina Follow Up    Patient presents with  Other.  In right eye.  This started 6 weeks ago.  Since onset it is stable.  I, the attending physician,  performed the HPI with the patient and updated documentation appropriately.          Comments    Pt here for 6 wk retinal follow up, retained lens material OD. Pt states vision is good. The only problem patient reports is redness and some burning, photophobia  in OD. Pt reports using Prolensa gtts leftover from cataract sx in OD for the redness for a few weeks.        Last edited by Bernarda Caffey, MD on 02/20/2021 12:01 PM. (History)    Pt is using latanoprost QHS OU, she states she used Prolensa for a couple weeks bc her right eye was red, she states she is no longer using it bc the redness has gone away, she states her floaters have almost totally disappeared   Referring physician: Vivi Barrack, MD [place],  [place] [id number]  HISTORICAL INFORMATION:   Selected notes from the MEDICAL RECORD NUMBER Referred by Dr. Venetia Maxon for retained lens material OD LEE:  Ocular Hx- PMH-    CURRENT MEDICATIONS: Current Outpatient Medications (Ophthalmic Drugs)  Medication Sig  . prednisoLONE acetate (PRED FORTE) 1 % ophthalmic suspension Place 1 drop into the right eye 6 (six) times daily.   . Travoprost, BAK Free, (TRAVATAN) 0.004 % SOLN ophthalmic solution Place 1 drop into both eyes at bedtime.   No current facility-administered medications for this visit. (Ophthalmic Drugs)   Current Outpatient Medications (Other)  Medication Sig  . Calcium Carbonate-Vit D-Min (QC CALCIUM-MAGNESIUM-ZINC-D3 PO) Take 1 tablet by mouth once a week.  . diazepam (VALIUM) 5 MG tablet TAKE 1 TABLET BY MOUTH EVERY 6  HOURS AS NEEDED FOR ANXIETY  . famotidine (PEPCID) 20 MG tablet Take 1 tablet (20 mg total) by mouth at bedtime.  . fluticasone (FLONASE) 50 MCG/ACT nasal spray Use 2 spray(s) in each nostril once daily  . melatonin 3 MG TABS tablet Take 3 mg by mouth at bedtime as needed (sleep).  . polyethylene glycol (MIRALAX / GLYCOLAX) 17 g packet Take 17 g by mouth daily as needed.  . Turmeric (QC TUMERIC COMPLEX PO) Take 1 capsule by mouth daily.    No current facility-administered medications for this visit. (Other)      REVIEW OF SYSTEMS: ROS    Positive for: Neurological, Musculoskeletal, HENT, Eyes, Respiratory   Negative for: Constitutional, Gastrointestinal, Skin, Genitourinary, Endocrine, Cardiovascular, Psychiatric, Allergic/Imm, Heme/Lymph   Last edited by Kingsley Spittle, COT on 02/19/2021  1:41 PM. (History)       ALLERGIES Allergies  Allergen Reactions  . Codeine Nausea Only  . Estrogens Conjugated Other (See Comments)    HA  . Paroxetine Hcl Other (See Comments)    Felt like she was "out of her head"    PAST MEDICAL HISTORY Past Medical History:  Diagnosis Date  . Allergy   . Anxiety    panic attack  . Depression   . GERD (gastroesophageal reflux disease)   . Glaucoma   . Glaucoma   . Hearing  loss   . 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 Hyperlipidemia   . Hypertensive retinopathy    OU  . Migraines    ocular  . OSA on CPAP 02/02/2014   PSG 09-27-2012 RDI 18.9 and AHi 9.8, Epworth 15 .titrated to only 5 cm water.   . Osteoporosis   . Sleep apnea   . Tinnitus    Past Surgical History:  Procedure Laterality Date  . AIR/FLUID EXCHANGE Right 09/12/2020   Procedure: AIR/FLUID EXCHANGE;  Surgeon: Bernarda Caffey, MD;  Location: [facility];  Service: Ophthalmology;  Laterality: Right;  . CATARACT EXTRACTION Bilateral OD: 10.25.21 OS: 12.2015   Dr. Venetia Maxon  . cataract surgery    . COSMETIC SURGERY    . EYE SURGERY Bilateral    Cat Sx -  Dr. Venetia Maxon  . EYE SURGERY Right 09/12/2020   PPV/EL for retained lens material - Dr. Bernarda Caffey  . PARS PLANA VITRECTOMY Right 09/12/2020   Procedure: PARS PLANA VITRECTOMY WITH 23 GAUGE;  Surgeon: Bernarda Caffey, MD;  Location: [facility];  Service: Ophthalmology;  Laterality: Right;  . PHOTOCOAGULATION WITH LASER Right 09/12/2020   Procedure: PHOTOCOAGULATION WITH LASER;  Surgeon: Bernarda Caffey, MD;  Location: [facility];  Service: Ophthalmology;  Laterality: Right;  . REMOVAL RETAINED LENS Right 09/12/2020   Procedure: REMOVAL RETAINED LENS;  Surgeon: Bernarda Caffey, MD;  Location: [facility];  Service: Ophthalmology;  Laterality: Right;    FAMILY HISTORY Family History  Problem Relation Age of Onset  . Osteoporosis Mother   . Stroke Mother   . Hyperlipidemia Mother   . Hypertension Mother   . Kidney disease Father   . Congestive Heart Failure Father   . Heart disease Father   . Hyperlipidemia Father   . Stroke Sister   . Parkinson's disease Sister   . Heart disease Sister   . Heart disease Maternal Grandmother   . Gout Maternal Grandmother   . Stroke Maternal Grandfather   . Stroke Paternal Grandmother   . Thrombosis Paternal Grandfather   . Early death Paternal Grandfather   . Hypertension Brother   . Diabetes Brother   . Arthritis/Rheumatoid Other   . Parkinson's disease Other   . Heart disease Other   . Obesity Daughter   . Breast cancer Neg Hx     SOCIAL HISTORY Social History   Tobacco Use  . Smoking status: Former Research scientist (life sciences)  . Smokeless tobacco: Never Used  . Tobacco comment: Quit in 1987  Vaping Use  . Vaping Use: Never used  Substance Use Topics  . Alcohol use: Yes    Alcohol/week: 12.0 standard drinks    Types: 12 Glasses of wine per week    Comment: wine or alcohol daily  . Drug use: No         OPHTHALMIC EXAM:  Base Eye Exam    Visual Acuity (Snellen - Linear)      Right Left   Dist [place] 20/20 20/60 -2   Dist ph [place]  20/25 -2       Tonometry (Tonopen,  1:53 PM)      Right Left   Pressure 13 17       Pupils      Dark Light Shape React APD   Right 4 3 Round Brisk None   Left 4 3 Round Brisk None       Visual Fields (Counting fingers)      Left Right    Full  Extraocular Movement      Right Left    Full, Ortho Full, Ortho       Neuro/Psych    Oriented x3: Yes   Mood/Affect: Normal       Dilation    Both eyes: 1.0% Mydriacyl, 2.5% Phenylephrine @ 1:54 PM        Slit Lamp and Fundus Exam    Slit Lamp Exam      Right Left   Lids/Lashes Dermatochalasis - upper lid Dermatochalasis - upper lid   Conjunctiva/Sclera 1-2+ Injection White and quiet   Cornea Trace Punctate epithelial erosions, well healed temporal cataract wound, mild arcus Trace Punctate epithelial erosions, well healed temporal cataract wounds   Anterior Chamber Deep and quiet Deep and quiet, Deep   Iris Round and dilated, mild iridodenesis Round and dilated   Lens 3 piece sulcus IOL in good position, open PC, cortical remnants resolved PC IOL in good position with trace PCO non-central   Vitreous post vitrectomy, small cortical remnant fragment settled inferiorly -- resolved, trace vitreous condensations syneresis       Fundus Exam      Right Left   Disc mild tilt, mild pallor, +cupping, mild PPP, Sharp rim Pink and Sharp, mild tilt, IT PPA   C/D Ratio 0.7 0.6   Macula Flat, Blunted foveal reflex, mild RPE mottling, No heme or edema Flat, Blunted foveal reflex, mild RPE mottling, No heme or edema   Vessels attenuated, Tortuous attenuated, Tortuous   Periphery attached, retained lens material gone; 360 peripheral laser changes Attached             IMAGING AND PROCEDURES  Imaging and Procedures for 02/19/2021  OCT, Retina - OU - Both Eyes       Right Eye Quality was good. Central Foveal Thickness: 300. Progression has been stable. Findings include normal foveal contour, no IRF, no SRF, retinal drusen  (stable improvement in vitreous  opacities).   Left Eye Quality was good. Central Foveal Thickness: 285. Progression has been stable. Findings include normal foveal contour, no IRF, no SRF.   Notes *Images captured and stored on drive  Diagnosis / Impression:  NFP, no IRF/SRF OU OD: stable improvement in vitreous opacities  Clinical management:  See below  Abbreviations: NFP - Normal foveal profile. CME - cystoid macular edema. PED - pigment epithelial detachment. IRF - intraretinal fluid. SRF - subretinal fluid. EZ - ellipsoid zone. ERM - epiretinal membrane. ORA - outer retinal atrophy. ORT - outer retinal tubulation. SRHM - subretinal hyper-reflective material. IRHM - intraretinal hyper-reflective material                ASSESSMENT/PLAN:    ICD-10-CM   1. Retained lens material following cataract surgery of right eye  H59.021   2. Retinal edema  H35.81 OCT, Retina - OU - Both Eyes  3. Essential hypertension  I10   4. Hypertensive retinopathy of both eyes  H35.033   5. Pseudophakia, both eyes  Z96.1   6. Primary open angle glaucoma of both eyes, unspecified glaucoma stage  H40.1130     1. Retained lens material OD  - s/p CE, sulcus IOL on Monday 10.25.21  - broken posterior capsule, nuclear and cortical lens fragments in vitreous  - had IOP spike to 40 on 11.3.21  - improved post drops, AC tap and diamox by Dr. Venetia Maxon  - persistently elevated IOP and microcystic corneal edema  - s/p PPV/EL OD 11.11.21 for removal of retained lens material             -  doing well  - VA 20/20 OD -- stable             - IOP good at 13  - trace residual cortical remnants at 0630 -- completely resolved today  - latanoprost qhs OU  - continue ATs 2-3x/day OD -- pt states she hasn't been using  - f/u 1 year, DFE, OCT  2. No retinal edema on exam or OCT  3,4. Hypertensive retinopathy OU - discussed importance of tight BP control - monitor  5. Pseudophakia OU  - s/p CE/IOL (Dr. Venetia Maxon, OD: 10.25.21, OS:  12.2015)  - IOLs in good position  - retained lens material OD as above - monitor  6. History of POAG  - was managed by Dr. Venetia Maxon -- pt wishes to transfer providers  - IOP 13,17 on latan qhs OU  - will refer to Dr. Kathlen Mody for glaucoma eval and management   Ophthalmic Meds Ordered this visit:  No orders of the defined types were placed in this encounter.      Return in about 1 year (around 02/19/2022) for f/u retained lens material OD, DFE, OCT.  There are no Patient Instructions on file for this visit.   Explained the diagnoses, plan, and follow up with the patient and they expressed understanding.  Patient expressed understanding of the importance of proper follow up care.   This document serves as a record of services personally performed by Gardiner Sleeper, MD, PhD. It was created on their behalf by Roselee Nova, COMT. The creation of this record is the provider's dictation and/or activities during the visit.  Electronically signed by: Roselee Nova, COMT 02/20/21 12:04 PM   This document serves as a record of services personally performed by Gardiner Sleeper, MD, PhD. It was created on their behalf by San Jetty. Owens Shark, OA an ophthalmic technician. The creation of this record is the provider's dictation and/or activities during the visit.    Electronically signed by: San Jetty. Owens Shark, [place] 04.20.2022 12:04 PM   Gardiner Sleeper, M.D., Ph.D. Diseases & Surgery of the Retina and Vitreous [facility]  I have reviewed the above documentation for accuracy and completeness, and I agree with the above. Gardiner Sleeper, M.D., Ph.D. 02/20/21 12:04 PM  Abbreviations: M myopia (nearsighted); A astigmatism; H hyperopia (farsighted); P presbyopia; Mrx spectacle prescription;  CTL contact lenses; OD right eye; OS left eye; OU both eyes  XT exotropia; ET esotropia; PEK punctate epithelial keratitis; PEE punctate epithelial erosions; DES dry eye syndrome; MGD meibomian  gland dysfunction; ATs artificial tears; PFAT's preservative free artificial tears; [place] nuclear sclerotic cataract; PSC posterior subcapsular cataract; ERM epi-retinal membrane; PVD posterior vitreous detachment; RD retinal detachment; DM diabetes mellitus; DR diabetic retinopathy; NPDR non-proliferative diabetic retinopathy; PDR proliferative diabetic retinopathy; CSME clinically significant macular edema; DME diabetic macular edema; dbh dot blot hemorrhages; CWS cotton wool spot; POAG primary open angle glaucoma; C/D cup-to-disc ratio; HVF humphrey visual field; GVF goldmann visual field; OCT optical coherence tomography; IOP intraocular pressure; BRVO Branch retinal vein occlusion; CRVO central retinal vein occlusion; CRAO central retinal artery occlusion; BRAO branch retinal artery occlusion; RT retinal tear; SB scleral buckle; PPV pars plana vitrectomy; VH Vitreous hemorrhage; PRP panretinal laser photocoagulation; IVK intravitreal kenalog; VMT vitreomacular traction; MH Macular hole;  NVD neovascularization of the disc; NVE neovascularization elsewhere; AREDS age related eye disease study; ARMD age related macular degeneration; POAG primary open angle glaucoma; EBMD epithelial/anterior basement membrane dystrophy; ACIOL anterior chamber intraocular  lens; IOL intraocular lens; PCIOL posterior chamber intraocular lens; Phaco/IOL phacoemulsification with intraocular lens placement; [place] photorefractive keratectomy; LASIK laser assisted in situ keratomileusis; HTN hypertension; DM diabetes mellitus; COPD chronic obstructive pulmonary disease

## 2021-02-19 ENCOUNTER — Encounter (INDEPENDENT_AMBULATORY_CARE_PROVIDER_SITE_OTHER): Payer: Self-pay | Admitting: Ophthalmology

## 2021-02-19 ENCOUNTER — Ambulatory Visit (INDEPENDENT_AMBULATORY_CARE_PROVIDER_SITE_OTHER): Payer: Medicare HMO | Admitting: Ophthalmology

## 2021-02-19 ENCOUNTER — Other Ambulatory Visit: Payer: Self-pay

## 2021-02-19 DIAGNOSIS — H401130 Primary open-angle glaucoma, bilateral, stage unspecified: Secondary | ICD-10-CM | POA: Diagnosis not present

## 2021-02-19 DIAGNOSIS — H3581 Retinal edema: Secondary | ICD-10-CM | POA: Diagnosis not present

## 2021-02-19 DIAGNOSIS — Z961 Presence of intraocular lens: Secondary | ICD-10-CM | POA: Diagnosis not present

## 2021-02-19 DIAGNOSIS — H35033 Hypertensive retinopathy, bilateral: Secondary | ICD-10-CM | POA: Diagnosis not present

## 2021-02-19 DIAGNOSIS — H59021 Cataract (lens) fragments in eye following cataract surgery, right eye: Secondary | ICD-10-CM

## 2021-02-19 DIAGNOSIS — I10 Essential (primary) hypertension: Secondary | ICD-10-CM

## 2021-02-20 ENCOUNTER — Encounter: Payer: Self-pay | Admitting: Neurology

## 2021-02-20 ENCOUNTER — Encounter (INDEPENDENT_AMBULATORY_CARE_PROVIDER_SITE_OTHER): Payer: Self-pay | Admitting: Ophthalmology

## 2021-02-20 ENCOUNTER — Ambulatory Visit: Payer: Medicare HMO | Admitting: Neurology

## 2021-02-20 VITALS — BP 140/88 | HR 65 | Ht 63.0 in | Wt 176.0 lb

## 2021-02-20 DIAGNOSIS — Z9989 Dependence on other enabling machines and devices: Secondary | ICD-10-CM | POA: Diagnosis not present

## 2021-02-20 DIAGNOSIS — G3184 Mild cognitive impairment, so stated: Secondary | ICD-10-CM | POA: Diagnosis not present

## 2021-02-20 DIAGNOSIS — K2100 Gastro-esophageal reflux disease with esophagitis, without bleeding: Secondary | ICD-10-CM | POA: Diagnosis not present

## 2021-02-20 DIAGNOSIS — H9193 Unspecified hearing loss, bilateral: Secondary | ICD-10-CM

## 2021-02-20 DIAGNOSIS — R0982 Postnasal drip: Secondary | ICD-10-CM

## 2021-02-20 DIAGNOSIS — G250 Essential tremor: Secondary | ICD-10-CM | POA: Diagnosis not present

## 2021-02-20 DIAGNOSIS — G4733 Obstructive sleep apnea (adult) (pediatric): Secondary | ICD-10-CM | POA: Diagnosis not present

## 2021-02-20 MED ORDER — FAMOTIDINE 20 MG PO TABS: 20.0000 mg | ORAL_TABLET | Freq: Every day | ORAL | 0 refills | Status: DC

## 2021-02-20 NOTE — Progress Notes (Signed)
[facility]  Provider:  Larey Seat, M D  Referring Provider: Vivi Barrack, MD Primary Care Physician:  Vivi Barrack, MD  Chief Complaint  Patient presents with  . Follow-up    Pt alone, rm 10. Overall doing ok with new CPAP . She has had a rough year but seems to be on the up of everything. DME [employer], machine is new      HPI:   Nicole Holt is a 80 y.o. female seen here as a an established patient with OSA on CPAP on 02-20-2021.Nicole Holt previous CPAP machine was over 73 years old and had to be replaced she received a new [place] for the recalled and older one- this one is a  Radiation protection practitioner.  Her compliance information states that she has used the device 30 out of 30 days with 8 hours and 90 minutes on average.  She does have an average AHI of 1.0/h only this is an excellent resolution.  She is not in high air leakage.  She is using a dream wear nasal cradle mask.  She has 90 percentile pressure of 8 cmH2O but the window of pressure for her machine is between 5 and 8 cmH2O so ligamenta flava was originally set at 2 but we discussed today that she should increase the humidifier settings in order to combat dry throat.  She has a ramp time of 30 minutes and seems to be doing well with that.   So, my goal today is to actually increase the maximum pressure settings to 10 cm water. .  Last year, January 24, 2020- she had 7 deaths in her circle of friends. One family member's body was found in the heat of July outside in her garden after several days. She has still flash backs, smelled and even tasted the odor when she recalls the image.  Her sister died of PD in the pandemic.  She also then had flash backs of her husband's image in January 24, 1975.  He died after a severe trauma, he had shot himself in her presence to her head. She stayed a widow for 10 years.  She admitted to starting temporarily drinking heavily, and has stopped by now. She has visibly changed, trembling,  trembling voice, and facial flushing.  Speech therapy has been completed, her wordfinding has not progressed. Her hearing aids have not optimized her ability to listen, and she may have more recall difficulties.         RV 10-30-2019.  She is hard of hearing and would rather me using a face-shield. She reports a lot of broken veins in her legs. I encouraged her to see PCP for that. Her speech has been less fluent , more stammering.  She has been listening to great courses on CD ROM, established a day by day routine.  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  She became tearful when she spoke about lonely Christmas. Her sister has PD, and is ' pitiful" in declining health. Her grandson, whom she adores is now 11 years old and came over on Christmas Day. She failed on social distancing by her own accord. At the [name] is born on [birth year], she is using a C-Flex [employer] machine her download for the last 30 days shows 100% compliance no day without use of an average use at time of 9 hours 29 minutes and 59 seconds.  Average time in airleak is 2 minutes at night her  average AHI is 0.7 and her CPAP is set at the lowest possible pressure of 5 cm water.  The geriatric depression score was endorsed at 8 out of 15 points indicative of clinical depression, her Epworth Sleepiness Scale was endorsed at 6 out of 24 points and she does not feel necessarily fatigue but she acknowledges that she has a hard time getting out of bed in the morning. The CPAP machine was issued 09-27-2012.       CD -Revisit on 03-14-2018 She was originally sent by PA  Jerline Pain for OSA work up, now followed for CPAP compliance.  I had not seen Nicole Holt since 2017 and in the meantime she has followed with our nurse practitioner here for regular compliance checks.  Today she endorsed the fatigue severity score of 12 points, the Epworth sleepiness score is 3 points, the geriatric depression  score at 0 points.  Her C-Flex machine has been followed by [facility] And she received her machine  5.5 years ago and could get a new one. Has one nocturia at 3 AM. Sleeps 6-7 hours at night. No dry mouth nor headaches. No choking or snoring.   This CPAP still works, she has been highly compliant, 100% with a average use of time on all days of 8 hours and 50 minutes nightly, she has a residual AHI of only 0.6 with a CPAP setting of only 5 cm water.  This patient had a very mild apnea and mainly upper airway resistance syndrome which is treated with low pressure CPAP.  She never required oxygen, and she has been feeling less sleepy and less fatigued, clearly stating a benefit of CPAP therapy. She has still sinus post nasal drip.     Nicole Holt originally presented upon referral of Dr. Laney Pastor in 2013 . She is a right handed , chronically hoarse ,caucasian female patient , who  had reportedly loudly snoring and often woke herself from her snoring. She also woke up with a dry mouth and felt not  rested or restored in the mornings.  She gained weight , became less active.  Prior to her sleep evaluation she endorsed getting 6-7 hours of nocturnal sleep.  She also has suffered from allergic rhinitis and used an Teaching laboratory technician. Her grandchildren had reportedly been bothered by her loud snoring , which made her seek medical advice. She endorsed the Epworth score at 14 of 21 points at the time ( 2013) . The patient's sleep study in November 2013 documented AHI of 9.2 and RDI of 18, not SPLIT -  but she was hypoxic for over one hour at night.  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  She returned for a CPAP auto- titration and seemed to respond best to only 5 cm water pressure. She has tolerated this and it seems to have treated her review upper airway resistance he syndrome and apnea sufficiently she is using her with  the mask and humidifier at level 3-4. As she started on CPAP later that year , her rhinitis improved and she had no further sinusitis exacerbations.  She goes to bed at 11 Pm and falls asleep within 5 minutes, has a bathroom break at 2.30 AM and rarely another at 4.30. Unless she has a lot on her mind, she will easily fall asleep again.  Total sleep time is now 7-8 hours nightly, no daytime naps.  Her Epworth  sleepiness score had been reduced after she initiated CPAP he was and is currently at 6 points her fatigue severity score it on the 18 points , both greatly reduced. . Her geriatric depression score was at 3 points,  not indicative of depression.  Unable today to review the patient's compliance record she has 100% compliance on 180 date download, dated 02-02-2014. The nightly use of 7 hours 55 minutes with CPAP her residual AHI is 0.9 at that but CPAP pressure of 5 cm water. She loves her CPAP ! No neck , face or ENT surgery history .   Interval history since the patient underwent a CPAP titration on 09-27-12 as been followed yearly in our office.  Today is 03-12-15, the patient is currently under [facility] coverage and uses the only available DME, preop. She has a CPAP machine by [employer] system 1 with heated humidifier,  coiled air tubing and uses a S-M size nasal mask. She has still other size liners, but only this one fits.  She needs to reorder this size through upper area. Her compliance information is available today downloaded in office on with a date 03-11-15 she's 100% compliant forward 30 out of 30 days of use 100% for over 4 hours of daily use with an average user time of 8 hours and 17 minutes. Her average AHI is 0.8 and a CPAP pressure of only 5 cm water. Her speech is non fluent at the beginning of today's visit, but improved. She looks for words.   Review of Systems: Out of a complete 14 system review, the patient complains of only the following symptoms, and  all other reviewed systems are negative.  Her speech is non fluent at the beginning of today's visit, but improved.  She looks for words.   She writes fluently.   UARS - very mild OSA- can't sleep without CPAP.  8 cm water worked well for her.nasal cradle or pillow is her preferred interface.    Her 44 year old sister suffers from Parkinson's disease, falls all the time.  How likely are you to doze in the following situations: 0 = not likely, 1 = slight chance, 2 = moderate chance, 3 = high chance  Sitting and Reading? Watching Television? Sitting inactive in a public place (theater or meeting)? Lying down in the afternoon when circumstances permit? Sitting and talking to someone? Sitting quietly after lunch without alcohol? In a car, while stopped for a few minutes in traffic? As a passenger in a car for an hour without a break?  Total = 5/ 24    Today she endorsed the Epworth sleepiness score is 1 point,  the geriatric depression score at 0 points.   Her Phillips CPAP machine has been followed by [place]    Social History   Socioeconomic History  . Marital status: Married    Spouse name: Legrand Como  . Number of children: 2  . Years of education: 44  . Highest education level: Some college, no degree  Occupational History  . Occupation: Retired  Tobacco Use  . Smoking status: Former Research scientist (life sciences)  . Smokeless tobacco: Never Used  . Tobacco comment: Quit in 1987  Vaping Use  . Vaping Use: Never used  Substance and Sexual Activity  . Alcohol use: Yes    Alcohol/week: 12.0 standard drinks    Types: 12 Glasses of wine per week    Comment: wine or alcohol daily  . Drug use: No  . Sexual activity: Yes  Birth control/protection: Post-menopausal  Other Topics Concern  . Not on file  Social History Narrative   Patient is married Legrand Como) and lives with her husband.   Patient is retired.   Patient has two children.   Patient is right-handed.   Patient has a college  education.   Patient drinks two cups of coffee daily.            Social Determinants of Health   Financial Resource Strain: Low Risk   . Difficulty of Paying Living Expenses: Not hard at all  Food Insecurity: No Food Insecurity  . Worried About Charity fundraiser in the Last Year: Never true  . Ran Out of Food in the Last Year: Never true  Transportation Needs: No Transportation Needs  . Lack of Transportation (Medical): No  . Lack of Transportation (Non-Medical): No  Physical Activity: Inactive  . Days of Exercise per Week: 0 days  . Minutes of Exercise per Session: 0 min  Stress: Stress Concern Present  . Feeling of Stress : Rather much  Social Connections: Socially Isolated  . Frequency of Communication with Friends and Family: Once a week  . Frequency of Social Gatherings with Friends and Family: Once a week  . Attends Religious Services: Never  . Active Member of Clubs or Organizations: No  . Attends Archivist Meetings: Never  . Marital Status: Married  Human resources officer Violence: Not At Risk  . Fear of Current or Ex-Partner: No  . Emotionally Abused: No  . Physically Abused: No  . Sexually Abused: No    Family History  Problem Relation Age of Onset  . Osteoporosis Mother   . Stroke Mother   . Hyperlipidemia Mother   . Hypertension Mother   . Kidney disease Father   . Congestive Heart Failure Father   . Heart disease Father   . Hyperlipidemia Father   . Stroke Sister   . Parkinson's disease Sister   . Heart disease Sister   . Heart disease Maternal Grandmother   . Gout Maternal Grandmother   . Stroke Maternal Grandfather   . Stroke Paternal Grandmother   . Thrombosis Paternal Grandfather   . Early death Paternal Grandfather   . Hypertension Brother   . Diabetes Brother   . Arthritis/Rheumatoid Other   . Parkinson's disease Other   . Heart disease Other   . Obesity Daughter   . Breast cancer Neg Hx     Past Medical History:  Diagnosis  Date  . Allergy   . Anxiety    panic attack  . Depression   . GERD (gastroesophageal reflux disease)   . Glaucoma   . Glaucoma   . Hearing loss   . 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 Hyperlipidemia   . Hypertensive retinopathy    OU  . Migraines    ocular  . OSA on CPAP 02/02/2014   PSG 09-27-2012 RDI 18.9 and AHi 9.8, Epworth 15 .titrated to only 5 cm water.   . Osteoporosis   . Sleep apnea   . Tinnitus     Past Surgical History:  Procedure Laterality Date  . AIR/FLUID EXCHANGE Right 09/12/2020   Procedure: AIR/FLUID EXCHANGE;  Surgeon: Bernarda Caffey, MD;  Location: [place];  Service: Ophthalmology;  Laterality: Right;  . CATARACT EXTRACTION Bilateral OD: 10.25.21 OS: 12.2015   Dr. Venetia Maxon  . cataract surgery    . COSMETIC SURGERY    . EYE SURGERY Bilateral  Cat Sx - Dr. Venetia Maxon  . EYE SURGERY Right 09/12/2020   PPV/EL for retained lens material - Dr. Bernarda Caffey  . PARS PLANA VITRECTOMY Right 09/12/2020   Procedure: PARS PLANA VITRECTOMY WITH 23 GAUGE;  Surgeon: Bernarda Caffey, MD;  Location: [place];  Service: Ophthalmology;  Laterality: Right;  . PHOTOCOAGULATION WITH LASER Right 09/12/2020   Procedure: PHOTOCOAGULATION WITH LASER;  Surgeon: Bernarda Caffey, MD;  Location: [place];  Service: Ophthalmology;  Laterality: Right;  . REMOVAL RETAINED LENS Right 09/12/2020   Procedure: REMOVAL RETAINED LENS;  Surgeon: Bernarda Caffey, MD;  Location: [place];  Service: Ophthalmology;  Laterality: Right;    Current Outpatient Medications  Medication Sig Dispense Refill  . Calcium Carbonate-Vit D-Min (QC CALCIUM-MAGNESIUM-ZINC-D3 PO) Take 1 tablet by mouth once a week.    . diazepam (VALIUM) 5 MG tablet TAKE 1 TABLET BY MOUTH EVERY 6 HOURS AS NEEDED FOR ANXIETY 30 tablet 0  . fluticasone (FLONASE) 50 MCG/ACT nasal spray Use 2 spray(s) in each nostril once daily 16 g 0  . melatonin 3 MG TABS tablet Take 3 mg by mouth at bedtime as needed (sleep).    .  polyethylene glycol (MIRALAX / GLYCOLAX) 17 g packet Take 17 g by mouth daily as needed.    . prednisoLONE acetate (PRED FORTE) 1 % ophthalmic suspension Place 1 drop into the right eye 6 (six) times daily.     . Travoprost, BAK Free, (TRAVATAN) 0.004 % SOLN ophthalmic solution Place 1 drop into both eyes at bedtime.    . Turmeric (QC TUMERIC COMPLEX PO) Take 1 capsule by mouth daily.      No current facility-administered medications for this visit.    Allergies as of 02/20/2021 - Review Complete 02/20/2021  Allergen Reaction Noted  . Codeine Nausea Only 01/18/2012  . Estrogens conjugated Other (See Comments) 01/18/2012  . Paroxetine hcl Other (See Comments) 01/18/2012    Vitals: BP 140/88   Pulse 65   Ht 5\' 3"  (1.6 m)   Wt 176 lb (79.8 kg)   BMI 31.18 kg/m  Last Weight:  Wt Readings from Last 1 Encounters:  02/20/21 176 lb (79.8 kg)   Last Height:   Ht Readings from Last 1 Encounters:  02/20/21 5\' 3"  (1.6 m)    Physical exam:  General: The patient is awake, alert and appears not in acute distress.  The patient is well groomed. Has dysphonia.   Montreal Cognitive Assessment Blind 02/20/2021 03/10/2017 03/10/2016  Attention: Read list of digits (0/2) 2 2 2   Attention: Read list of letters (0/1) 1 1 1   Attention: Serial 7 subtraction starting at 100 (0/3) 3 2 3   Language: Repeat phrase (0/2) 0 1 1  Language : Fluency (0/1) 1 1 0  Abstraction (0/2) 2 2 2   Delayed Recall (0/5) 5 5 3   Orientation (0/6) 6 6 6   Total 20 - -    Head: Normocephalic, atraumatic.  Neck is supple. Mallampati 2 , neck circumference:15.0, has a mild tongue tremor,  jaw tremor, tremulous speech.  Cardiovascular:  Regular rate and rhythm, without murmurs or carotid bruit, and without distended neck veins. Respiratory: Lungs are clear to auscultation.Skin: tanned.  Without evidence of edema, or rash. Facial rosacea.  Trunk: BMI is elevated 31.1 kg/m2.  This patient has normal posture.  Neurologic  exam : The patient is awake and alert, oriented to place and time.  There is a normal attention span & concentration ability.  Speech is non- fluent, she struggles  today with fluency,  is mid-sentence lost for words.  Mood and affect are outgoing, but volatile. Tearful.   She feels easily in tears. She is easily choked up.  Cranial nerves: Pupils are reactive to light. The patient just underwent a left-sided lensectomy.  She does have a slightly disrounded left pupil - wears no longer corrective lenses which allows her to not wear glasses. Funduscopic exam without evidence of pallor or edema.  Extraocular movements  in vertical and horizontal planes intact and without nystagmus.  Visual fields by finger perimetry are intact. Hearing corrected by hearing aids bilaterally- she speaks loudly.  Facial motor strength is symmetric. Motor exam:  Normal tone, muscle bulk and symmetric normal strength in all extremities. Sensory:  Fine touch and vibration were felt normal into the ankles and fingers. Coordination:  Finger-to-nose maneuver tested and this is performed with a mild tremor.  Gait and station: Patient walks without assistive device. Strength within normal limits.  Deep tendon reflexes: in the upper and lower extremities are symmetric and intact. Babinski maneuver response is downgoing.   Assessment:  After physical and neurologic examination, review of laboratory studies, imaging, neurophysiology testing and pre-existing records, assessment is   0) Tremor , essential, titubation and vocal, not affecting hand writing.   1) Mild OSA with UARS.  CPAP was recalled and renewed at 5-8 cm water, suprisingly 95% pressure is now at the current 8 cm water pressure- I like to get it up to 10 cm.     100% compliance, well controlled AHI under 1 ! .  Humidifier can be self adjusted.   2) OSA  Risk weight has been stabile, lost a few pounds.  BMI 31.9  Irritated throat. Keep the humidification up-  and treat for acid reflux.   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 The reported delay in word finding - delayed , not a "blank"  Stammering.  Expressive aphasia ? rather different. Went to Speech therapist- This is more a stammering, and apparently this is the opinion of Speech therapist as well.    Next visit again with [facility] please, in 24 month        Larey Seat, MD   10-30-2019 Medical Director of [facility] at [facility],  Fellow of the AASM, ABPN and ABSM.

## 2021-02-20 NOTE — Patient Instructions (Signed)
Sleep Apnea Sleep apnea is a condition in which breathing pauses or becomes shallow during sleep. Episodes of sleep apnea usually last 10 seconds or longer, and they may occur as many as 20 times an hour. Sleep apnea disrupts your sleep and keeps your body from getting the rest that it needs. This condition can increase your risk of certain health problems, including:  Heart attack.  Stroke.  Obesity.  Diabetes.  Heart failure.  Irregular heartbeat. What are the causes? There are three kinds of sleep apnea:  Obstructive sleep apnea. This kind is caused by a blocked or collapsed airway.  Central sleep apnea. This kind happens when the part of the brain that controls breathing does not send the correct signals to the muscles that control breathing.  Mixed sleep apnea. This is a combination of obstructive and central sleep apnea. The most common cause of this condition is a collapsed or blocked airway. An airway can collapse or become blocked if:  Your throat muscles are abnormally relaxed.  Your tongue and tonsils are larger than normal.  You are overweight.  Your airway is smaller than normal.   What increases the risk? You are more likely to develop this condition if you:  Are overweight.  Smoke.  Have a smaller than normal airway.  Are elderly.  Are female.  Drink alcohol.  Take sedatives or tranquilizers.  Have a family history of sleep apnea. What are the signs or symptoms? Symptoms of this condition include:  Trouble staying asleep.  Daytime sleepiness and tiredness.  Irritability.  Loud snoring.  Morning headaches.  Trouble concentrating.  Forgetfulness.  Decreased interest in sex.  Unexplained sleepiness.  Mood swings.  Personality changes.  Feelings of depression.  Waking up often during the night to urinate.  Dry mouth.  Sore throat. How is this diagnosed? This condition may be diagnosed with:  A medical history.  A physical  exam.  A series of tests that are done while you are sleeping (sleep study). These tests are usually done in a sleep lab, but they may also be done at home. How is this treated? Treatment for this condition aims to restore normal breathing and to ease symptoms during sleep. It may involve managing health issues that can affect breathing, such as high blood pressure or obesity. Treatment may include:  Sleeping on your side.  Using a decongestant if you have nasal congestion.  Avoiding the use of depressants, including alcohol, sedatives, and narcotics.  Losing weight if you are overweight.  Making changes to your diet.  Quitting smoking.  Using a device to open your airway while you sleep, such as: ? An oral appliance. This is a custom-made mouthpiece that shifts your lower jaw forward. ? A continuous positive airway pressure (CPAP) device. This device blows air through a mask when you breathe out (exhale). ? A nasal expiratory positive airway pressure (EPAP) device. This device has valves that you put into each nostril. ? A bi-level positive airway pressure (BPAP) device. This device blows air through a mask when you breathe in (inhale) and breathe out (exhale).  Having surgery if other treatments do not work. During surgery, excess tissue is removed to create a wider airway. It is important to get treatment for sleep apnea. Without treatment, this condition can lead to:  High blood pressure.  Coronary artery disease.  In men, an inability to achieve or maintain an erection (impotence).  Reduced thinking abilities.   Follow these instructions at home: Lifestyle    Make any lifestyle changes that your health care provider recommends.  Eat a healthy, well-balanced diet.  Take steps to lose weight if you are overweight.  Avoid using depressants, including alcohol, sedatives, and narcotics.  Do not use any products that contain nicotine or tobacco, such as cigarettes,  e-cigarettes, and chewing tobacco. If you need help quitting, ask your health care provider. General instructions  Take over-the-counter and prescription medicines only as told by your health care provider.  If you were given a device to open your airway while you sleep, use it only as told by your health care provider.  If you are having surgery, make sure to tell your health care provider you have sleep apnea. You may need to bring your device with you.  Keep all follow-up visits as told by your health care provider. This is important. Contact a health care provider if:  The device that you received to open your airway during sleep is uncomfortable or does not seem to be working.  Your symptoms do not improve.  Your symptoms get worse. Get help right away if:  You develop: ? Chest pain. ? Shortness of breath. ? Discomfort in your back, arms, or stomach.  You have: ? Trouble speaking. ? Weakness on one side of your body. ? Drooping in your face. These symptoms may represent a serious problem that is an emergency. Do not wait to see if the symptoms will go away. Get medical help right away. Call your local emergency services (911 in the [place] Do not drive yourself to the hospital. Summary  Sleep apnea is a condition in which breathing pauses or becomes shallow during sleep.  The most common cause is a collapsed or blocked airway.  The goal of treatment is to restore normal breathing and to ease symptoms during sleep. This information is not intended to replace advice given to you by your health care provider. Make sure you discuss any questions you have with your health care provider. Document Revised: 04/05/2019 Document Reviewed: 06/14/2018 Elsevier Patient Education  2021 [employer] Inc.  

## 2021-02-24 ENCOUNTER — Other Ambulatory Visit: Payer: Self-pay

## 2021-02-24 ENCOUNTER — Ambulatory Visit (INDEPENDENT_AMBULATORY_CARE_PROVIDER_SITE_OTHER): Payer: Medicare HMO

## 2021-02-24 DIAGNOSIS — E785 Hyperlipidemia, unspecified: Secondary | ICD-10-CM

## 2021-02-24 DIAGNOSIS — F419 Anxiety disorder, unspecified: Secondary | ICD-10-CM

## 2021-02-24 DIAGNOSIS — K2100 Gastro-esophageal reflux disease with esophagitis, without bleeding: Secondary | ICD-10-CM | POA: Diagnosis not present

## 2021-02-24 NOTE — Patient Instructions (Signed)
Ms. Krizan,  Thank you for talking with me today. I have included our care plan/goals in the following pages.   Please review and call me at [phone] with any questions.  Thanks! Ellin Mayhew, Pharm.D., BCGP Clinical Pharmacist [facility] [place] [phone] Patient Care Plan: [place] Plan    Problem Identified: HLD, Hyperglycemia, Anxiety, Insomnia   Priority: High    Long-Range Goal: Disease Management   Start Date: 02/24/2021  Expected End Date: 02/24/2022  This Visit's Progress: On track  Priority: High  Note:   Current Barriers:  . Working towards diet/exercise changes for improved hyperlipidemia control  Pharmacist Clinical Goal(s):  Marland Kitchen Patient will contact provider office for questions/concerns as evidenced notation of same in electronic health record through collaboration with PharmD and provider.   Interventions: . 1:1 collaboration with Vivi Barrack, MD regarding development and update of comprehensive plan of care as evidenced by provider attestation and co-signature . Inter-disciplinary care team collaboration (see longitudinal plan of care) . Comprehensive medication review performed; medication list updated in electronic medical record  Hyperlipidemia: (LDL goal < 100) -Not ideally controlled -Current treatment: . N/a  -Medications previously tried: statin declined -Current dietary patterns: has cut out alcohol - none within last 21 days.  -Current exercise habits: OA has limited exercise, walking tolerance slowly improving. Will be considering water walking next coming months.   -Educated on Exercise goal of 150 minutes per week; -Counseled on diet and exercise extensively  GERD (Goal: minimize symptoms) -Controlled -Current treatment  . Famotidine 20 mg once daily (4/21 - will pick up at pharmacy) -Medications previously tried: Tums  -Reviewed triggers to avoid, including NSAID  use -Recommended to continue current medication   Anxiety -Not ideally controlled -Feels has been reasonably managed recently. No alcohol consumed recently.  -Current regimen:  Diazepam 5 mg Q6H as needed -No side effects noted -Pt agreeable to seeing [place] for counseling - confirmed pt has contact information, plans to reach out.   -Is reconsidering [place] counseling - confirmed patient has contact information on - pt to call  Patient Goals/Self-Care Activities . Patient will:  - target a minimum of 150 minutes of moderate intensity exercise weekly  Follow Up Plan: CPA to reach out in 6 months to schedule f/u visit Medication Assistance: None required.  Patient affirms current coverage meets needs.     The patient verbalized understanding of instructions provided today and agreed to receive a  copy of patient instruction and/or educational materials. Telephone follow up appointment with pharmacy team member scheduled for: See next appointment with "Care Management Staff" under "What's Next" below.

## 2021-02-24 NOTE — Progress Notes (Signed)
Chronic Care Management Pharmacy Note  02/24/2021 Name:  Nicole Holt MRN:  [id number] DOB:  09-16-1941  Subjective: Nicole Holt is an 80 y.o. year old female who is a primary patient of Jerline Pain, Algis Greenhouse, MD.  The CCM team was consulted for assistance with disease management and care coordination needs.    Engaged with patient face to face for follow up visit in response to provider referral for pharmacy case management and/or care coordination services.   Consent to Services:  The patient was given information about Chronic Care Management services, agreed to services, and gave verbal consent prior to initiation of services.  Please see initial visit note for detailed documentation.   Patient Care Team: Vivi Barrack, MD as PCP - General (Family Medicine) Dennie Bible, NP as Nurse Practitioner (Neurology) Marylynn Pearson, MD as Consulting Physician (Ophthalmology) Dohmeier, Asencion Partridge, MD as Consulting Physician (Neurology) [facility]istry, [facility] Annabell Howells, [facility] as Pharmacist (Pharmacist)  Hospital visits: None in previous 6 months  Objective:  Lab Results  Component Value Date   CREATININE 0.87 11/12/2020   CREATININE 1.08 (H) 09/10/2020   CREATININE 0.89 07/15/2020    Lab Results  Component Value Date   HGBA1C 5.8 11/12/2020   Last diabetic Eye exam: No results found for: HMDIABEYEEXA  Last diabetic Foot exam: No results found for: HMDIABFOOTEX      Component Value Date/Time   CHOL 188 11/12/2020 1145   CHOL 197 09/27/2017 1057   TRIG 205.0 (H) 11/12/2020 1145   HDL 54.50 11/12/2020 1145   HDL 63 09/27/2017 1057   CHOLHDL 3 11/12/2020 1145   VLDL 41.0 (H) 11/12/2020 1145   LDLCALC 102 (H) 11/08/2019 1147   LDLCALC 102 (H) 09/27/2017 1057   LDLDIRECT 111.0 11/12/2020 1145    Hepatic Function Latest Ref Rng & Units 11/12/2020 09/10/2020 07/15/2020  Total Protein 6.0 - 8.3 g/dL 7.2 7.2 7.3  Albumin 3.5 - 5.2 g/dL 4.4 - -  AST 0 - 37 U/L _0 ALT 0 - 35 U/L _1 Alk Phosphatase 39 - 117 U/L 74 - -  Total Bilirubin 0.2 - 1.2 mg/dL 0.5 0.5 0.7    Lab Results  Component Value Date/Time   TSH 1.88 11/12/2020 11:45 AM   TSH 1.68 07/15/2020 12:23 PM    CBC Latest Ref Rng & Units 11/12/2020 09/10/2020 07/15/2020  WBC 4.0 - 10.5 K/uL 5.9 9.1 5.4  Hemoglobin 12.0 - 15.0 g/dL 13.9 14.8 15.4  Hematocrit 36.0 - 46.0 % 41.1 46.0(H) 45.9(H)  Platelets 150.0 - 400.0 K/uL 273.0 308 252    No results found for: VD25OH  Clinical ASCVD: No  The 10-year ASCVD risk score Mikey Bussing DC Jr., et al., 2013) is: 25.4%   Values used to calculate the score:     Age: 85 years     Sex: Female     Is Non-Hispanic African American: No     Diabetic: No     Tobacco smoker: No     Systolic Blood Pressure: 993 mmHg     Is BP treated: No     HDL Cholesterol: 54.5 mg/dL     Total Cholesterol: 188 mg/dL    Social History   Tobacco Use  Smoking Status Former Smoker  Smokeless Tobacco Never Used  Tobacco Comment   Quit in 1987   BP Readings from Last 3 Encounters:  02/20/21 140/88  11/12/20 126/82  11/07/20 120/74   Pulse Readings from Last  3 Encounters:  02/20/21 65  11/12/20 60  11/07/20 77   Wt Readings from Last 3 Encounters:  02/20/21 176 lb (79.8 kg)  11/12/20 176 lb (79.8 kg)  11/07/20 175 lb (79.4 kg)    Assessment: Review of patient past medical history, allergies, medications, health status, including review of consultants reports, laboratory and other test data, was performed as part of comprehensive evaluation and provision of chronic care management services.   SDOH:  (Social Determinants of Health) assessments and interventions performed:    CCM Care Plan  Allergies  Allergen Reactions  . Codeine Nausea Only  . Estrogens Conjugated Other (See Comments)    HA  . Paroxetine Hcl Other (See Comments)    Felt like she was "out of her head"    Medications Reviewed Today    Reviewed by Madelin Rear, [facility]  (Pharmacist) on 02/24/21 at [place] List Status: <None>  Medication Order Taking? Sig Documenting Provider Last Dose Status Informant  Calcium Carbonate-Vit D-Min (QC CALCIUM-MAGNESIUM-ZINC-D3 PO) [id number] No Take 1 tablet by mouth once a week. [provider] Taking Active   diazepam (VALIUM) 5 MG tablet [id number] No TAKE 1 TABLET BY MOUTH EVERY 6 HOURS AS NEEDED FOR ANXIETY Vivi Barrack, MD Taking Active   famotidine (PEPCID) 20 MG tablet [id number]  Take 1 tablet (20 mg total) by mouth at bedtime. Dohmeier, Asencion Partridge, MD  Active   fluticasone [facility]) 50 MCG/ACT nasal spray [id number] No Use 2 spray(s) in each nostril once daily Vivi Barrack, MD Taking Active   melatonin 3 MG TABS tablet [id number] No Take 3 mg by mouth at bedtime as needed (sleep). [provider] Taking Active Self  polyethylene glycol (MIRALAX / GLYCOLAX) 17 g packet [id number] No Take 17 g by mouth daily as needed. [provider] Taking Active Self  prednisoLONE acetate (PRED FORTE) 1 % ophthalmic suspension [id number] No Place 1 drop into the right eye 6 (six) times daily.  [provider] Taking Active Self  Travoprost, BAK Free, (TRAVATAN) 0.004 % SOLN ophthalmic solution [id number] No Place 1 drop into both eyes at bedtime. [provider] Taking Active   Turmeric (QC TUMERIC COMPLEX PO) [id number] No Take 1 capsule by mouth daily.  [provider] Taking Active Self          Patient Active Problem List   Diagnosis Date Noted  . Bilateral hearing loss 02/20/2021  . Gastroesophageal reflux disease with esophagitis without hemorrhage 02/20/2021  . 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 PND (post-nasal drip) 10/30/2019  . Sleep related choking sensation 10/30/2019  . Insomnia 08/31/2019  . Allergic rhinitis 04/24/2019  . Hyperglycemia 10/19/2018  . Constipation 10/14/2018  . Rectal bleeding 10/14/2018  . Dyslipidemia 05/24/2018  . Abnormal mammogram of  right breast 09/22/2016  . Rosacea 09/18/2015  . S/P cataract surgery 09/18/2015  . OSA on CPAP 02/02/2014  . Tremor, essential 02/02/2014  . UARS (upper airway resistance syndrome) 02/02/2014  . Osteoporosis 07/28/2012  . Glaucoma 07/28/2012  . Restless leg syndrome 07/28/2012  . Anxiety 07/28/2012  . Benign positional vertigo 07/28/2012  . Tinnitus 07/27/2012  . Hearing loss 07/27/2012  . Basal cell cancer 07/27/2012    Immunization History  Administered Date(s) Administered  . PFIZER(Purple Top)SARS-COV-2 Vaccination 01/07/2020, 02/07/2020, 11/06/2020  . Pneumococcal Conjugate-13 09/22/2016  . Pneumococcal Polysaccharide-23 07/27/2012  . Td 11/03/2007  . Tdap 11/12/2020  . Zoster 10/02/2015    Conditions to be addressed/monitored: HLD, Hyperglycemia, Anxiety, Insomnia  Care  Plan : [place]  Updates made by Madelin Rear, [facility] since 02/24/2021 12:00 AM    Problem: HLD, Hyperglycemia, Anxiety, Insomnia   Priority: High    Long-Range Goal: Disease Management   Start Date: 02/24/2021  Expected End Date: 02/24/2022  This Visit's Progress: On track  Priority: High  Note:   Current Barriers:  . Working towards diet/exercise changes for improved hyperlipidemia control  Pharmacist Clinical Goal(s):  Marland Kitchen Patient will contact provider office for questions/concerns as evidenced notation of same in electronic health record through collaboration with PharmD and provider.   Interventions: . 1:1 collaboration with Vivi Barrack, MD regarding development and update of comprehensive plan of care as evidenced by provider attestation and co-signature . Inter-disciplinary care team collaboration (see longitudinal plan of care) . Comprehensive medication review performed; medication list updated in electronic medical record  Hyperlipidemia: (LDL goal < 100) -Not ideally controlled -Current treatment: . N/a  -Medications previously tried: statin declined -Current dietary  patterns: has cut out alcohol - none within last 21 days.  -Current exercise habits: OA has limited exercise, walking tolerance slowly improving. Will be considering water walking next coming months.   -Educated on Exercise goal of 150 minutes per week; -Counseled on diet and exercise extensively  GERD (Goal: minimize symptoms) -Controlled -Current treatment  . Famotidine 20 mg once daily (4/21 - will pick up at pharmacy) -Medications previously tried: Tums  -Reviewed triggers to avoid, including NSAID use -Recommended to continue current medication   Anxiety -Not ideally controlled -Feels has been reasonably managed recently. No alcohol consumed recently.  -Current regimen:  Diazepam 5 mg Q6H as needed -No side effects noted -Is reconsidering [facility] counseling - confirmed patient has contact information on - pt to call  Patient Goals/Self-Care Activities . Patient will:  - target a minimum of 150 minutes of moderate intensity exercise weekly  Follow Up Plan: CPA to reach out in 6 months to schedule f/u visit Medication Assistance: None required.  Patient affirms current coverage meets needs.    Patient's preferred pharmacy is:  [facility] [address] [place] - [address] [place] [place] [address] Phone: [phone] Fax: [phone]  Follow Up:  Patient agrees to Care Plan and Follow-up. Future Appointments  Date Time Provider [place]  11/13/2021 11:00 AM [facility] HEALTH COACH [facility]  11/14/2021 11:00 AM Vivi Barrack, MD [facility]  11/27/2021 10:30 AM Dohmeier, Asencion Partridge, MD [facility] None  02/18/2022  1:30 PM Bernarda Caffey, MD T[facility] None   Madelin Rear, Pharm.D., BCGP Clinical Pharmacist [place] [phone]

## 2021-03-13 DIAGNOSIS — H401131 Primary open-angle glaucoma, bilateral, mild stage: Secondary | ICD-10-CM | POA: Diagnosis not present

## 2021-04-21 ENCOUNTER — Telehealth: Payer: Self-pay

## 2021-04-21 NOTE — Chronic Care Management (AMB) (Signed)
    Chronic Care Management Pharmacy Assistant   Name: Nicole Holt  MRN: [id number] DOB: [birth year]  Reason for Encounter: General Adherence Call   Recent office visits:  No visits noted  Recent consult visits:  No visits noted  Hospital visits:  None in previous 6 months  Medications: Outpatient Encounter Medications as of 04/21/2021  Medication Sig   Calcium Carbonate-Vit D-Min (QC CALCIUM-MAGNESIUM-ZINC-D3 PO) Take 1 tablet by mouth once a week.   diazepam (VALIUM) 5 MG tablet TAKE 1 TABLET BY MOUTH EVERY 6 HOURS AS NEEDED FOR ANXIETY   famotidine (PEPCID) 20 MG tablet Take 1 tablet (20 mg total) by mouth at bedtime.   fluticasone (FLONASE) 50 MCG/ACT nasal spray Use 2 spray(s) in each nostril once daily   melatonin 3 MG TABS tablet Take 3 mg by mouth at bedtime as needed (sleep).   polyethylene glycol (MIRALAX / GLYCOLAX) 17 g packet Take 17 g by mouth daily as needed.   prednisoLONE acetate (PRED FORTE) 1 % ophthalmic suspension Place 1 drop into the right eye 6 (six) times daily.    Travoprost, BAK Free, (TRAVATAN) 0.004 % SOLN ophthalmic solution Place 1 drop into both eyes at bedtime.   Turmeric (QC TUMERIC COMPLEX PO) Take 1 capsule by mouth daily.    No facility-administered encounter medications on file as of 04/21/2021.   I spoke with [name] today and she is doing well. She does not have any complaints and stated that everything is the same from the last time she visited with CPP.   Have you had any problems recently with your health? Patient stated she has not had any recent problems with her health   Have you had any problems with your pharmacy? Patient stated she has not had any recent problems with [facility]   What issues or side effects are you having with your medications? Patient stated she has not had any recent problems with his medications   What would you like me to pass along to Sherrill Potts,CPP for them to help you with?  Patient stated she has  nothing to pass along   What can we do to take care of you better? Patient had no suggestions   Star Rating Drugs: No star drugs   [place] [facility] CPA, CMA

## 2021-04-30 ENCOUNTER — Telehealth: Payer: Self-pay

## 2021-04-30 NOTE — Chronic Care Management (AMB) (Signed)
    Chronic Care Management Pharmacy Assistant   Name: ADRI SCHLOSS  MRN: [id number] DOB: [birth year]   Medications: Outpatient Encounter Medications as of 04/30/2021  Medication Sig   Calcium Carbonate-Vit D-Min (QC CALCIUM-MAGNESIUM-ZINC-D3 PO) Take 1 tablet by mouth once a week.   diazepam (VALIUM) 5 MG tablet TAKE 1 TABLET BY MOUTH EVERY 6 HOURS AS NEEDED FOR ANXIETY   famotidine (PEPCID) 20 MG tablet Take 1 tablet (20 mg total) by mouth at bedtime.   fluticasone (FLONASE) 50 MCG/ACT nasal spray Use 2 spray(s) in each nostril once daily   melatonin 3 MG TABS tablet Take 3 mg by mouth at bedtime as needed (sleep).   polyethylene glycol (MIRALAX / GLYCOLAX) 17 g packet Take 17 g by mouth daily as needed.   prednisoLONE acetate (PRED FORTE) 1 % ophthalmic suspension Place 1 drop into the right eye 6 (six) times daily.    Travoprost, BAK Free, (TRAVATAN) 0.004 % SOLN ophthalmic solution Place 1 drop into both eyes at bedtime.   Turmeric (QC TUMERIC COMPLEX PO) Take 1 capsule by mouth daily.    No facility-administered encounter medications on file as of 04/30/2021.    Reviewed chart for medication changes and adherence.  No OVs, Consults, or hospital visits since last care coordination call / Pharmacist visit. No medication changes indicated  No gaps in adherence identified. Patient has follow up scheduled with pharmacy team. No further action required.   [place]

## 2021-05-14 DIAGNOSIS — M5032 Other cervical disc degeneration, mid-cervical region, unspecified level: Secondary | ICD-10-CM | POA: Diagnosis not present

## 2021-05-14 DIAGNOSIS — M9901 Segmental and somatic dysfunction of cervical region: Secondary | ICD-10-CM | POA: Diagnosis not present

## 2021-05-14 DIAGNOSIS — M62830 Muscle spasm of back: Secondary | ICD-10-CM | POA: Diagnosis not present

## 2021-05-14 DIAGNOSIS — M9903 Segmental and somatic dysfunction of lumbar region: Secondary | ICD-10-CM | POA: Diagnosis not present

## 2021-06-02 ENCOUNTER — Telehealth: Payer: Self-pay

## 2021-06-02 NOTE — Telephone Encounter (Signed)
Patient is calling in stating that she did some labs through [facility], one of the tests was High Sensitive c- reactive protein. The level was range 1.10-3.0 mg and her's came back  7.70. Patient is very concerned and doesn't understand what the test was. Asked for an appointment for today or tomorrow, advised we had no open appointments, patient then asked if Dr.Parker could get back with her to go over the result of this test.

## 2021-06-02 NOTE — Telephone Encounter (Signed)
Please see message and advise 

## 2021-06-02 NOTE — Telephone Encounter (Signed)
This is a non specific test that just measures inflammation.  Could be elevated due to multiple causes.  Recommend she come in for an office visit. This does not need to be urgent.  Algis Greenhouse. Jerline Pain, MD 06/02/2021 2:37 PM

## 2021-06-03 DIAGNOSIS — L820 Inflamed seborrheic keratosis: Secondary | ICD-10-CM | POA: Diagnosis not present

## 2021-06-03 DIAGNOSIS — I8393 Asymptomatic varicose veins of bilateral lower extremities: Secondary | ICD-10-CM | POA: Diagnosis not present

## 2021-06-03 DIAGNOSIS — Z85828 Personal history of other malignant neoplasm of skin: Secondary | ICD-10-CM | POA: Diagnosis not present

## 2021-06-03 DIAGNOSIS — L905 Scar conditions and fibrosis of skin: Secondary | ICD-10-CM | POA: Diagnosis not present

## 2021-06-03 DIAGNOSIS — L570 Actinic keratosis: Secondary | ICD-10-CM | POA: Diagnosis not present

## 2021-06-03 DIAGNOSIS — L821 Other seborrheic keratosis: Secondary | ICD-10-CM | POA: Diagnosis not present

## 2021-06-05 NOTE — Telephone Encounter (Signed)
Left voice message for patient to call clinic.  

## 2021-06-06 NOTE — Telephone Encounter (Signed)
Patient is scheduled to see Dr Jerline Pain

## 2021-06-06 NOTE — Telephone Encounter (Signed)
Noted  

## 2021-06-06 NOTE — Telephone Encounter (Signed)
Left message on voicemail to call office. Pt needs to schedule appt with Dr. Jerline Pain.

## 2021-06-11 ENCOUNTER — Telehealth: Payer: Self-pay | Admitting: Pharmacist

## 2021-06-11 NOTE — Chronic Care Management (AMB) (Addendum)
    Chronic Care Management Pharmacy Assistant   Name: Nicole Holt  MRN: [id number] DOB: Mar 20, 1941  Reason for Encounter: General Adherence Call    Recent office visits:  None  Recent consult visits:  None  Hospital visits:  None in previous 6 months  Medications: Outpatient Encounter Medications as of 06/11/2021  Medication Sig   Calcium Carbonate-Vit D-Min (QC CALCIUM-MAGNESIUM-ZINC-D3 PO) Take 1 tablet by mouth once a week.   diazepam (VALIUM) 5 MG tablet TAKE 1 TABLET BY MOUTH EVERY 6 HOURS AS NEEDED FOR ANXIETY   famotidine (PEPCID) 20 MG tablet Take 1 tablet (20 mg total) by mouth at bedtime.   fluticasone (FLONASE) 50 MCG/ACT nasal spray Use 2 spray(s) in each nostril once daily   melatonin 3 MG TABS tablet Take 3 mg by mouth at bedtime as needed (sleep).   polyethylene glycol (MIRALAX / GLYCOLAX) 17 g packet Take 17 g by mouth daily as needed.   prednisoLONE acetate (PRED FORTE) 1 % ophthalmic suspension Place 1 drop into the right eye 6 (six) times daily.    Travoprost, BAK Free, (TRAVATAN) 0.004 % SOLN ophthalmic solution Place 1 drop into both eyes at bedtime.   Turmeric (QC TUMERIC COMPLEX PO) Take 1 capsule by mouth daily.    No facility-administered encounter medications on file as of 06/11/2021.   Patient Questions: Have you had any problems recently with your health? Patient states she has not had any problems with her health recently.  Have you had any problems with your pharmacy? Patient states she has not had any problems with her pharmacy.  What issues or side effects are you having with your medications? Patient states she is not having any issues or side effects from any of her medications.  What would you like me to pass along to Leata Mouse, CPP for him to help you with?  Patient states she doesn't have anything to pass along at this time.  What can we do to take care of you better? Patient did not have any suggestions.  Future Appointments   Date Time Provider [place]  06/20/2021 10:40 AM Vivi Barrack, MD [facility]  11/13/2021 11:00 AM [facility] HEALTH COACH [facility]  11/14/2021 11:00 AM Vivi Barrack, MD [facility]  11/27/2021 10:30 AM Dohmeier, Asencion Partridge, MD [facility] None  02/18/2022  1:30 PM Bernarda Caffey, MD T[facility]TRE None     Star Rating Drugs: None  April D Calhoun, [place] Pharmacist Assistant [phone]  10 minutes spent in review, coordination, and documentation.  Reviewed by: Beverly Milch, PharmD Clinical Pharmacist [phone]

## 2021-06-17 DIAGNOSIS — H401131 Primary open-angle glaucoma, bilateral, mild stage: Secondary | ICD-10-CM | POA: Diagnosis not present

## 2021-06-18 DIAGNOSIS — M5032 Other cervical disc degeneration, mid-cervical region, unspecified level: Secondary | ICD-10-CM | POA: Diagnosis not present

## 2021-06-18 DIAGNOSIS — M9901 Segmental and somatic dysfunction of cervical region: Secondary | ICD-10-CM | POA: Diagnosis not present

## 2021-06-18 DIAGNOSIS — M9903 Segmental and somatic dysfunction of lumbar region: Secondary | ICD-10-CM | POA: Diagnosis not present

## 2021-06-18 DIAGNOSIS — M62830 Muscle spasm of back: Secondary | ICD-10-CM | POA: Diagnosis not present

## 2021-06-20 ENCOUNTER — Encounter: Payer: Self-pay | Admitting: Family Medicine

## 2021-06-20 ENCOUNTER — Other Ambulatory Visit: Payer: Self-pay

## 2021-06-20 ENCOUNTER — Ambulatory Visit (INDEPENDENT_AMBULATORY_CARE_PROVIDER_SITE_OTHER): Payer: Medicare HMO | Admitting: Family Medicine

## 2021-06-20 VITALS — BP 123/71 | HR 65 | Temp 98.0°F | Ht 63.0 in | Wt 172.8 lb

## 2021-06-20 DIAGNOSIS — K625 Hemorrhage of anus and rectum: Secondary | ICD-10-CM

## 2021-06-20 DIAGNOSIS — M810 Age-related osteoporosis without current pathological fracture: Secondary | ICD-10-CM

## 2021-06-20 DIAGNOSIS — K2100 Gastro-esophageal reflux disease with esophagitis, without bleeding: Secondary | ICD-10-CM | POA: Diagnosis not present

## 2021-06-20 DIAGNOSIS — R7982 Elevated C-reactive protein (CRP): Secondary | ICD-10-CM | POA: Diagnosis not present

## 2021-06-20 DIAGNOSIS — K5900 Constipation, unspecified: Secondary | ICD-10-CM

## 2021-06-20 DIAGNOSIS — F419 Anxiety disorder, unspecified: Secondary | ICD-10-CM

## 2021-06-20 DIAGNOSIS — M1990 Unspecified osteoarthritis, unspecified site: Secondary | ICD-10-CM | POA: Diagnosis not present

## 2021-06-20 MED ORDER — DIAZEPAM 5 MG PO TABS: 5.0000 mg | ORAL_TABLET | Freq: Four times a day (QID) | ORAL | 0 refills | Status: DC | PRN

## 2021-06-20 NOTE — Assessment & Plan Note (Signed)
Stable on Pepcid 20 mg daily. 

## 2021-06-20 NOTE — Progress Notes (Signed)
   Nicole Holt is a 80 y.o. female who presents today for an office visit.  Assessment/Plan:  New/Acute Problems: Elevated CRP  Discussed with patient this is a nonspecific finding.  Likely related to her osteoarthritis that is currently under care of chiropractor.  She did have an elevated CRP last year as well.  No signs of systemic illness.  Reassured patient.  We will continue management as previous.  She will let me know if symptoms change.  Chronic Problems Addressed Today: Anxiety Overall stable.  Will refill Valium 5 mg 3 times daily as needed.  She takes this very sparingly.  Osteoporosis We will check we will she will check with her breast imaging center about getting a bone density scan done soon.  Osteoarthritis   Managed by chiropractor.  Symptoms are relatively well controlled at this point.  Likely contributing to her elevated CRP.  Gastroesophageal reflux disease with esophagitis without hemorrhage Stable on Pepcid 20 mg daily.  Rectal bleeding Likely due to hemorrhoids.  She has seen GI in the past.  Constipation is playing a role.  She will work on fiber supplementation and stool softeners.  Constipation Still not very well controlled.  Recommended MiraLAX daily.  Also recommended fiber supplementation.  Recommended good oral hydration.  She may also have some pelvic floor dysfunction which is contributing.  Discussed referral to GI however she deferred for now.  She will try above conservative measures and if still not improving will need to be seen by them.     Subjective:  HPI:  Patient recently had screening test performed from outside facility including CRP, ABIs, abdominal ultrasound, and carotid artery ultrasound.  She was concerned about elevated CRP to 7.7.  Around the time of her last visit, she was going to the chiropractor 3 times a week due to anxiety causing pain due to muscle tension. Since then, her anxiety, inflammation and pain/aches has  decreased .  Additionally, she had a period of time where she felt like she was "blocked up", with pain, flatulence as well as blood. She expresses a concern about a change of coloration in fecal matter with it becoming darker. After taking a laxative she had passed some sort of tissue which she could not determine the identity of. The description for it is long and flat.She admits to constipation still being an issue for her, despite the usage of Miralax and dietary choices aimed towards alleviation, such as prune juice. She denies using Miralax every day.  Also, she states that she has a hemorrhoid.          Objective:  Physical Exam: BP 123/71   Pulse 65   Temp 98 F (36.7 C) (Temporal)   Ht '5\' 3"'$  (1.6 m)   Wt 172 lb 12.8 oz (78.4 kg)   SpO2 97%   BMI 30.61 kg/m   Gen: No acute distress, resting comfortably Neuro: Grossly normal, moves all extremities Psych: Normal affect and thought content      I,Jordan Kelly,acting as a scribe for Dimas Chyle, MD.,have documented all relevant documentation on the behalf of Dimas Chyle, MD,as directed by  Dimas Chyle, MD while in the presence of Dimas Chyle, MD.  I, Dimas Chyle, MD, have reviewed all documentation for this visit. The documentation on 06/20/21 for the exam, diagnosis, procedures, and orders are all accurate and complete.  Algis Greenhouse. Jerline Pain, MD 06/20/2021 11:32 AM

## 2021-06-20 NOTE — Patient Instructions (Signed)
It was very nice to see you today!  We will refill your Ativan today.  Your inflammatory numbers are better than last year.  Please make sure that you are getting plenty of fiber in your diet.  You can use MiraLAX and Benefiber/Metamucil as needed.  I will see back early next year for your physical.  Come back to see me sooner if needed.  Take care, Dr Jerline Pain  PLEASE NOTE:  If you had any lab tests please let us know if you have not heard back within a few days. You may see your results on mychart before we have a chance to review them but we will give you a call once they are reviewed by [place]. If we ordered any referrals today, please let us know if you have not heard from their office within the next week.   Please try these tips to maintain a healthy lifestyle:  Eat at least 3 REAL meals and 1-2 snacks per day.  Aim for no more than 5 hours between eating.  If you eat breakfast, please do so within one hour of getting up.   Each meal should contain half fruits/vegetables, one quarter protein, and one quarter carbs (no bigger than a computer mouse)  Cut down on sweet beverages. This includes juice, soda, and sweet tea.   Drink at least 1 glass of water with each meal and aim for at least 8 glasses per day  Exercise at least 150 minutes every week.

## 2021-06-20 NOTE — Assessment & Plan Note (Signed)
Likely due to hemorrhoids.  She has seen GI in the past.  Constipation is playing a role.  She will work on fiber supplementation and stool softeners.

## 2021-06-20 NOTE — Assessment & Plan Note (Signed)
Still not very well controlled.  Recommended MiraLAX daily.  Also recommended fiber supplementation.  Recommended good oral hydration.  She may also have some pelvic floor dysfunction which is contributing.  Discussed referral to GI however she deferred for now.  She will try above conservative measures and if still not improving will need to be seen by them.

## 2021-06-20 NOTE — Assessment & Plan Note (Signed)
Overall stable.  Will refill Valium 5 mg 3 times daily as needed.  She takes this very sparingly.

## 2021-06-20 NOTE — Assessment & Plan Note (Signed)
We will check we will she will check with her breast imaging center about getting a bone density scan done soon.

## 2021-06-20 NOTE — Assessment & Plan Note (Signed)
Managed by chiropractor.  Symptoms are relatively well controlled at this point.  Likely contributing to her elevated CRP.

## 2021-07-02 DIAGNOSIS — M62830 Muscle spasm of back: Secondary | ICD-10-CM | POA: Diagnosis not present

## 2021-07-02 DIAGNOSIS — M9901 Segmental and somatic dysfunction of cervical region: Secondary | ICD-10-CM | POA: Diagnosis not present

## 2021-07-02 DIAGNOSIS — M9903 Segmental and somatic dysfunction of lumbar region: Secondary | ICD-10-CM | POA: Diagnosis not present

## 2021-07-02 DIAGNOSIS — M5032 Other cervical disc degeneration, mid-cervical region, unspecified level: Secondary | ICD-10-CM | POA: Diagnosis not present

## 2021-07-22 ENCOUNTER — Other Ambulatory Visit: Payer: Self-pay | Admitting: Family Medicine

## 2021-07-22 DIAGNOSIS — Z1231 Encounter for screening mammogram for malignant neoplasm of breast: Secondary | ICD-10-CM

## 2021-07-23 DIAGNOSIS — M9901 Segmental and somatic dysfunction of cervical region: Secondary | ICD-10-CM | POA: Diagnosis not present

## 2021-07-23 DIAGNOSIS — M9903 Segmental and somatic dysfunction of lumbar region: Secondary | ICD-10-CM | POA: Diagnosis not present

## 2021-07-23 DIAGNOSIS — M62830 Muscle spasm of back: Secondary | ICD-10-CM | POA: Diagnosis not present

## 2021-07-23 DIAGNOSIS — M5032 Other cervical disc degeneration, mid-cervical region, unspecified level: Secondary | ICD-10-CM | POA: Diagnosis not present

## 2021-07-29 ENCOUNTER — Other Ambulatory Visit: Payer: Self-pay | Admitting: Family Medicine

## 2021-07-29 DIAGNOSIS — J3489 Other specified disorders of nose and nasal sinuses: Secondary | ICD-10-CM

## 2021-07-30 DIAGNOSIS — Z20822 Contact with and (suspected) exposure to covid-19: Secondary | ICD-10-CM | POA: Diagnosis not present

## 2021-08-13 DIAGNOSIS — M5032 Other cervical disc degeneration, mid-cervical region, unspecified level: Secondary | ICD-10-CM | POA: Diagnosis not present

## 2021-08-13 DIAGNOSIS — M9903 Segmental and somatic dysfunction of lumbar region: Secondary | ICD-10-CM | POA: Diagnosis not present

## 2021-08-13 DIAGNOSIS — M9901 Segmental and somatic dysfunction of cervical region: Secondary | ICD-10-CM | POA: Diagnosis not present

## 2021-08-13 DIAGNOSIS — M62830 Muscle spasm of back: Secondary | ICD-10-CM | POA: Diagnosis not present

## 2021-08-19 ENCOUNTER — Telehealth: Payer: Self-pay | Admitting: Pharmacist

## 2021-08-19 NOTE — Chronic Care Management (AMB) (Signed)
    Chronic Care Management Pharmacy Assistant   Name: Nicole Holt  MRN: [id number] DOB: 10/14/1941   Reason for Encounter: General Adherence Call    Recent office visits:  06/20/2021 [facility] (PCP) Vivi Barrack, MD; no medication changes indicated.  Recent consult visits:  None  Hospital visits:  None in previous 6 months  Medications: Outpatient Encounter Medications as of 08/19/2021  Medication Sig   Calcium Carbonate-Vit D-Min (QC CALCIUM-MAGNESIUM-ZINC-D3 PO) Take 1 tablet by mouth once a week.   diazepam (VALIUM) 5 MG tablet Take 1 tablet (5 mg total) by mouth every 6 (six) hours as needed. for anxiety   famotidine (PEPCID) 20 MG tablet Take 1 tablet (20 mg total) by mouth at bedtime.   fluticasone (FLONASE) 50 MCG/ACT nasal spray Use 2 spray(s) in each nostril once daily   latanoprost (XALATAN) 0.005 % ophthalmic solution    melatonin 3 MG TABS tablet Take 3 mg by mouth at bedtime as needed (sleep).   polyethylene glycol (MIRALAX / GLYCOLAX) 17 g packet Take 17 g by mouth daily as needed.   prednisoLONE acetate (PRED FORTE) 1 % ophthalmic suspension Place 1 drop into the right eye 6 (six) times daily.    Travoprost, BAK Free, (TRAVATAN) 0.004 % SOLN ophthalmic solution Place 1 drop into both eyes at bedtime.   Turmeric (QC TUMERIC COMPLEX PO) Take 1 capsule by mouth daily.    No facility-administered encounter medications on file as of 08/19/2021.   Patient Questions: Have you had any problems recently with your health? Patient states she has not had any problems recently with her health.  Have you had any problems with your pharmacy? Patient states she has not had any problems recently with her pharmacy.  What issues or side effects are you having with your medications? Patient states she is not currently having any issues or side effects with any of her medications.  What would you like me to pass along to Leata Mouse, CPP for him to help you with?  Patient states  she does not have anything to pass along at this time.  What can we do to take care of you better? Patient did not have any suggestions.  Patient declined to schedule a follow up appointment with the clinical pharmacist.   Care Gaps: Medicare Annual Wellness: Completed Hemoglobin A1C: 5.8% on 11/12/2020 Colonoscopy: Completed 05/17/2019 Dexa Scan: Completed Mammogram: Completed 08/22/2020  Future Appointments  Date Time Provider [place]  08/25/2021  1:10 PM [facility] MM 2 [facility]  11/13/2021 11:00 AM [facility] HEALTH COACH [facility]  11/14/2021 11:00 AM Vivi Barrack, MD [facility]  11/27/2021 10:30 AM Dohmeier, Asencion Partridge, MD [facility] None  02/18/2022  1:30 PM Bernarda Caffey, MD [facility] None    Star Rating Drugs: None  April D Calhoun, [place] Pharmacist Assistant [phone]

## 2021-08-25 ENCOUNTER — Ambulatory Visit
Admission: RE | Admit: 2021-08-25 | Discharge: 2021-08-25 | Disposition: A | Discharge status: Home / Self Care | Payer: Medicare HMO | Source: Ambulatory Visit | Attending: Family Medicine | Admitting: Family Medicine

## 2021-08-25 ENCOUNTER — Other Ambulatory Visit: Payer: Self-pay

## 2021-08-25 DIAGNOSIS — Z1231 Encounter for screening mammogram for malignant neoplasm of breast: Secondary | ICD-10-CM

## 2021-09-04 DIAGNOSIS — Z08 Encounter for follow-up examination after completed treatment for malignant neoplasm: Secondary | ICD-10-CM | POA: Diagnosis not present

## 2021-09-04 DIAGNOSIS — Z85828 Personal history of other malignant neoplasm of skin: Secondary | ICD-10-CM | POA: Diagnosis not present

## 2021-09-04 DIAGNOSIS — L718 Other rosacea: Secondary | ICD-10-CM | POA: Diagnosis not present

## 2021-09-04 DIAGNOSIS — D1801 Hemangioma of skin and subcutaneous tissue: Secondary | ICD-10-CM | POA: Diagnosis not present

## 2021-09-04 DIAGNOSIS — L821 Other seborrheic keratosis: Secondary | ICD-10-CM | POA: Diagnosis not present

## 2021-09-17 DIAGNOSIS — M9903 Segmental and somatic dysfunction of lumbar region: Secondary | ICD-10-CM | POA: Diagnosis not present

## 2021-09-17 DIAGNOSIS — M9901 Segmental and somatic dysfunction of cervical region: Secondary | ICD-10-CM | POA: Diagnosis not present

## 2021-09-17 DIAGNOSIS — M62830 Muscle spasm of back: Secondary | ICD-10-CM | POA: Diagnosis not present

## 2021-09-17 DIAGNOSIS — M5032 Other cervical disc degeneration, mid-cervical region, unspecified level: Secondary | ICD-10-CM | POA: Diagnosis not present

## 2021-10-08 DIAGNOSIS — M9903 Segmental and somatic dysfunction of lumbar region: Secondary | ICD-10-CM | POA: Diagnosis not present

## 2021-10-08 DIAGNOSIS — M9901 Segmental and somatic dysfunction of cervical region: Secondary | ICD-10-CM | POA: Diagnosis not present

## 2021-10-08 DIAGNOSIS — M62830 Muscle spasm of back: Secondary | ICD-10-CM | POA: Diagnosis not present

## 2021-10-08 DIAGNOSIS — M5032 Other cervical disc degeneration, mid-cervical region, unspecified level: Secondary | ICD-10-CM | POA: Diagnosis not present

## 2021-11-13 ENCOUNTER — Ambulatory Visit (INDEPENDENT_AMBULATORY_CARE_PROVIDER_SITE_OTHER): Payer: Medicare HMO

## 2021-11-13 ENCOUNTER — Other Ambulatory Visit: Payer: Self-pay

## 2021-11-13 VITALS — BP 130/64 | HR 65 | Temp 98.0°F | Wt 176.0 lb

## 2021-11-13 DIAGNOSIS — Z0000 Encounter for general adult medical examination without abnormal findings: Secondary | ICD-10-CM | POA: Diagnosis not present

## 2021-11-13 NOTE — Patient Instructions (Signed)
Nicole Holt , Thank you for taking time to come for your Medicare Wellness Visit. I appreciate your ongoing commitment to your health goals. Please review the following plan we discussed and let me know if I can assist you in the future.   Screening recommendations/referrals: Colonoscopy: no longer required  Mammogram: Done 08/25/21 repeat every year  Bone Density: Done 12/09/16 repeat every 2 years  Recommended yearly ophthalmology/optometry visit for glaucoma screening and checkup Recommended yearly dental visit for hygiene and checkup  Vaccinations: Influenza vaccine: declined  Pneumococcal vaccine: Up to date Tdap vaccine: Done 11/12/20 repeat every 10 years  Shingles vaccine: Shingrix discussed. Please contact your pharmacy for coverage information.    Covid-19:Completed 3/7, 02/07/20, & 11/06/20  Advanced directives: Copies in chart   Conditions/risks identified: continue to go to gym   Next appointment: Follow up in one year for your annual wellness visit    Preventive Care 65 Years and Older, Female Preventive care refers to lifestyle choices and visits with your health care provider that can promote health and wellness. What does preventive care include? A yearly physical exam. This is also called an annual well check. Dental exams once or twice a year. Routine eye exams. Ask your health care provider how often you should have your eyes checked. Personal lifestyle choices, including: Daily care of your teeth and gums. Regular physical activity. Eating a healthy diet. Avoiding tobacco and drug use. Limiting alcohol use. Practicing safe sex. Taking low-dose aspirin every day. Taking vitamin and mineral supplements as recommended by your health care provider. What happens during an annual well check? The services and screenings done by your health care provider during your annual well check will depend on your age, overall health, lifestyle risk factors, and family history of  disease. Counseling  Your health care provider may ask you questions about your: Alcohol use. Tobacco use. Drug use. Emotional well-being. Home and relationship well-being. Sexual activity. Eating habits. History of falls. Memory and ability to understand (cognition). Work and work Statistician. Reproductive health. Screening  You may have the following tests or measurements: Height, weight, and BMI. Blood pressure. Lipid and cholesterol levels. These may be checked every 5 years, or more frequently if you are over 60 years old. Skin check. Lung cancer screening. You may have this screening every year starting at age 77 if you have a 30-pack-year history of smoking and currently smoke or have quit within the past 15 years. Fecal occult blood test (FOBT) of the stool. You may have this test every year starting at age 16. Flexible sigmoidoscopy or colonoscopy. You may have a sigmoidoscopy every 5 years or a colonoscopy every 10 years starting at age 69. Hepatitis C blood test. Hepatitis B blood test. Sexually transmitted disease (STD) testing. Diabetes screening. This is done by checking your blood sugar (glucose) after you have not eaten for a while (fasting). You may have this done every 1-3 years. Bone density scan. This is done to screen for osteoporosis. You may have this done starting at age 40. Mammogram. This may be done every 1-2 years. Talk to your health care provider about how often you should have regular mammograms. Talk with your health care provider about your test results, treatment options, and if necessary, the need for more tests. Vaccines  Your health care provider may recommend certain vaccines, such as: Influenza vaccine. This is recommended every year. Tetanus, diphtheria, and acellular pertussis (Tdap, Td) vaccine. You may need a Td booster every 10 years.  Zoster vaccine. You may need this after age 34. Pneumococcal 13-valent conjugate (PCV13) vaccine. One  dose is recommended after age 93. Pneumococcal polysaccharide (PPSV23) vaccine. One dose is recommended after age 20. Talk to your health care provider about which screenings and vaccines you need and how often you need them. This information is not intended to replace advice given to you by your health care provider. Make sure you discuss any questions you have with your health care provider. Document Released: 11/15/2015 Document Revised: 07/08/2016 Document Reviewed: 08/20/2015 Elsevier Interactive Patient Education  2017 [place] Prevention in the Home Falls can cause injuries. They can happen to people of all ages. There are many things you can do to make your home safe and to help prevent falls. What can I do on the outside of my home? Regularly fix the edges of walkways and driveways and fix any cracks. Remove anything that might make you trip as you walk through a door, such as a raised step or threshold. Trim any bushes or trees on the path to your home. Use bright outdoor lighting. Clear any walking paths of anything that might make someone trip, such as rocks or tools. Regularly check to see if handrails are loose or broken. Make sure that both sides of any steps have handrails. Any raised decks and porches should have guardrails on the edges. Have any leaves, snow, or ice cleared regularly. Use sand or salt on walking paths during winter. Clean up any spills in your garage right away. This includes oil or grease spills. What can I do in the bathroom? Use night lights. Install grab bars by the toilet and in the tub and shower. Do not use towel bars as grab bars. Use non-skid mats or decals in the tub or shower. If you need to sit down in the shower, use a plastic, non-slip stool. Keep the floor dry. Clean up any water that spills on the floor as soon as it happens. Remove soap buildup in the tub or shower regularly. Attach bath mats securely with double-sided  non-slip rug tape. Do not have throw rugs and other things on the floor that can make you trip. What can I do in the bedroom? Use night lights. Make sure that you have a light by your bed that is easy to reach. Do not use any sheets or blankets that are too big for your bed. They should not hang down onto the floor. Have a firm chair that has side arms. You can use this for support while you get dressed. Do not have throw rugs and other things on the floor that can make you trip. What can I do in the kitchen? Clean up any spills right away. Avoid walking on wet floors. Keep items that you use a lot in easy-to-reach places. If you need to reach something above you, use a strong step stool that has a grab bar. Keep electrical cords out of the way. Do not use floor polish or wax that makes floors slippery. If you must use wax, use non-skid floor wax. Do not have throw rugs and other things on the floor that can make you trip. What can I do with my stairs? Do not leave any items on the stairs. Make sure that there are handrails on both sides of the stairs and use them. Fix handrails that are broken or loose. Make sure that handrails are as long as the stairways. Check any carpeting to make sure that  it is firmly attached to the stairs. Fix any carpet that is loose or worn. Avoid having throw rugs at the top or bottom of the stairs. If you do have throw rugs, attach them to the floor with carpet tape. Make sure that you have a light switch at the top of the stairs and the bottom of the stairs. If you do not have them, ask someone to add them for you. What else can I do to help prevent falls? Wear shoes that: Do not have high heels. Have rubber bottoms. Are comfortable and fit you well. Are closed at the toe. Do not wear sandals. If you use a stepladder: Make sure that it is fully opened. Do not climb a closed stepladder. Make sure that both sides of the stepladder are locked into place. Ask  someone to hold it for you, if possible. Clearly mark and make sure that you can see: Any grab bars or handrails. First and last steps. Where the edge of each step is. Use tools that help you move around (mobility aids) if they are needed. These include: Canes. Walkers. Scooters. Crutches. Turn on the lights when you go into a dark area. Replace any light bulbs as soon as they burn out. Set up your furniture so you have a clear path. Avoid moving your furniture around. If any of your floors are uneven, fix them. If there are any pets around you, be aware of where they are. Review your medicines with your doctor. Some medicines can make you feel dizzy. This can increase your chance of falling. Ask your doctor what other things that you can do to help prevent falls. This information is not intended to replace advice given to you by your health care provider. Make sure you discuss any questions you have with your health care provider. Document Released: 08/15/2009 Document Revised: 03/26/2016 Document Reviewed: 11/23/2014 Elsevier Interactive Patient Education  2017 [employer].

## 2021-11-13 NOTE — Progress Notes (Addendum)
Subjective:   Nicole Holt is a 81 y.o. female who presents for Medicare Annual (Subsequent) preventive examination.  Review of Systems     Cardiac Risk Factors include: advanced age (>5men, >90 women);dyslipidemia;obesity (BMI >30kg/m2)     Objective:    Today's Vitals   11/13/21 1055  BP: 130/64  Pulse: 65  Temp: 98 F (36.7 C)  SpO2: 96%  Weight: 176 lb (79.8 kg)   Body mass index is 31.18 kg/m.  Advanced Directives 11/13/2021 11/07/2020 09/12/2020 10/23/2019 10/14/2018 09/27/2017  Does Patient Have a Medical Advance Directive? Yes Yes Yes Yes Yes Yes  Type of Paramedic of [place];Living will Living will Living will;Healthcare Power of [place];Living will [place];Living will  Does patient want to make changes to medical advance directive? - - - No - Patient declined No - Patient declined -  Copy of [place] in Chart? Yes - validated most recent copy scanned in chart (See row information) Yes - validated most recent copy scanned in chart (See row information) - Yes - validated most recent copy scanned in chart (See row information) No - copy requested No - copy requested    Current Medications (verified) Outpatient Encounter Medications as of 11/13/2021  Medication Sig   Calcium Carbonate-Vit D-Min (QC CALCIUM-MAGNESIUM-ZINC-D3 PO) Take 1 tablet by mouth once a week.   diazepam (VALIUM) 5 MG tablet Take 1 tablet (5 mg total) by mouth every 6 (six) hours as needed. for anxiety   fluticasone (FLONASE) 50 MCG/ACT nasal spray Use 2 spray(s) in each nostril once daily   latanoprost (XALATAN) 0.005 % ophthalmic solution    melatonin 3 MG TABS tablet Take 3 mg by mouth at bedtime as needed (sleep).   polyethylene glycol (MIRALAX / GLYCOLAX) 17 g packet Take 17 g by mouth daily as needed.   Turmeric (QC TUMERIC COMPLEX PO) Take 1 capsule by mouth daily.     [DISCONTINUED] famotidine (PEPCID) 20 MG tablet Take 1 tablet (20 mg total) by mouth at bedtime.   [DISCONTINUED] prednisoLONE acetate (PRED FORTE) 1 % ophthalmic suspension Place 1 drop into the right eye 6 (six) times daily.    [DISCONTINUED] Travoprost, BAK Free, (TRAVATAN) 0.004 % SOLN ophthalmic solution Place 1 drop into both eyes at bedtime.   No facility-administered encounter medications on file as of 11/13/2021.    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    Osteoporosis    Sleep apnea    Tinnitus    Past Surgical History:  Procedure Laterality Date   AIR/FLUID EXCHANGE Right 09/12/2020   Procedure: AIR/FLUID EXCHANGE;  Surgeon: Bernarda Caffey, MD;  Location: [place];  Service: Ophthalmology;  Laterality: Right;   CATARACT EXTRACTION Bilateral OD: 10.25.21 OS: 12.2015   Dr. Venetia Maxon   cataract surgery     COSMETIC SURGERY  EYE SURGERY Bilateral    Cat Sx - Dr. Venetia Maxon   EYE SURGERY Right 09/12/2020   PPV/EL for retained lens material - Dr. Bernarda Caffey   PARS PLANA VITRECTOMY Right 09/12/2020   Procedure: PARS PLANA VITRECTOMY WITH 23 GAUGE;  Surgeon: Bernarda Caffey, MD;  Location: [place];  Service: Ophthalmology;  Laterality: Right;   PHOTOCOAGULATION WITH LASER Right 09/12/2020   Procedure: PHOTOCOAGULATION WITH LASER;  Surgeon: Bernarda Caffey, MD;  Location: [place];  Service: Ophthalmology;  Laterality: Right;   REMOVAL RETAINED LENS Right 09/12/2020   Procedure: REMOVAL RETAINED LENS;  Surgeon: Bernarda Caffey, MD;   Location: [place];  Service: Ophthalmology;  Laterality: Right;   Family History  Problem Relation Age of Onset   Osteoporosis Mother    Stroke Mother    Hyperlipidemia Mother    Hypertension Mother    Kidney disease Father    Congestive Heart Failure Father    Heart disease Father    Hyperlipidemia Father    Stroke Sister    Parkinson's disease Sister    Heart disease Sister    Heart disease Maternal Grandmother    Gout Maternal Grandmother    Stroke Maternal Grandfather    Stroke Paternal Grandmother    Thrombosis Paternal Grandfather    Early death Paternal Grandfather    Hypertension Brother    Diabetes Brother    Arthritis/Rheumatoid Other    Parkinson's disease Other    Heart disease Other    Obesity Daughter    Breast cancer Neg Hx    Social History   Socioeconomic History   Marital status: Married    Spouse name: Legrand Como   Number of children: 2   Years of education: 16   Highest education level: Some college, no degree  Occupational History   Occupation: Retired  Tobacco Use   Smoking status: Former   Smokeless tobacco: Never   Tobacco comments:    Quit in [place] Use   Vaping Use: Never used  Substance and Sexual Activity   Alcohol use: Yes    Alcohol/week: 12.0 standard drinks    Types: 12 Glasses of wine per week    Comment: wine or alcohol daily   Drug use: No   Sexual activity: Yes    Birth control/protection: Post-menopausal  Other Topics Concern   Not on file  Social History Narrative   Patient is married Legrand Como) and lives with her husband.   Patient is retired.   Patient has two children.   Patient is right-handed.   Patient has a college education.   Patient drinks two cups of coffee daily.            Social Determinants of Health   Financial Resource Strain: Low Risk    Difficulty of Paying Living Expenses: Not hard at all  Food Insecurity: No Food Insecurity   Worried About Charity fundraiser in the Last Year: Never true    [place] in the Last Year: Never true  Transportation Needs: No Transportation Needs   Lack of Transportation (Medical): No   Lack of Transportation (Non-Medical): No  Physical Activity: Insufficiently Active   Days of Exercise per Week: 3 days   Minutes of Exercise per Session: 30 min  Stress: Stress Concern Present   Feeling of Stress : Rather much  Social Connections: Moderately Isolated   Frequency of Communication with Friends and Family: More than three times a week   Frequency of Social Gatherings with  Friends and Family: More than three times a week   Attends Religious Services: Never   Marine scientist or Organizations: No   Attends Music therapist: Never   Marital Status: Married    Tobacco Counseling Counseling given: Not Answered Tobacco comments: Quit in 1987   Clinical Intake:  Pre-visit preparation completed: Yes  Pain : No/denies pain     BMI - recorded: 31.18 Nutritional Status: BMI > 30  Obese Nutritional Risks: None Diabetes: No  How often do you need to have someone help you when you read instructions, pamphlets, or other written materials from your doctor or pharmacy?: 1 - Never  Diabetic?no  Interpreter Needed?: No  Information entered by :: Charlott Rakes, LPN   Activities of Daily Living In your present state of health, do you have any difficulty performing the following activities: 11/13/2021  Hearing? Y  Vision? N  Difficulty concentrating or making decisions? N  Walking or climbing stairs? N  Dressing or bathing? N  Doing errands, shopping? N  Preparing Food and eating ? N  Using the Toilet? N  In the past six months, have you accidently leaked urine? Y  Do you have problems with loss of bowel control? N  Managing your Medications? N  Managing your Finances? N  Housekeeping or managing your Housekeeping? N  Some recent data might be hidden    Patient Care Team: Vivi Barrack, MD as PCP - General  (Family Medicine) Dennie Bible, NP as Nurse Practitioner (Neurology) Marylynn Pearson, MD as Consulting Physician (Ophthalmology) Dohmeier, Asencion Partridge, MD as Consulting Physician (Neurology) [facility], [facility] Annabell Howells, [facility] as Pharmacist (Pharmacist)  Indicate any recent Medical Services you may have received from other than [facility] providers in the past year (date may be approximate).     Assessment:   This is a routine wellness examination for Nicole Holt.  Hearing/Vision screen Hearing Screening - Comments:: Pt stated HOH need to get new hearing aids  Vision Screening - Comments:: Pt follows up with Dr Zamora/Dr Eliseo Squires @ [facility]    For annual eye exams   Dietary issues and exercise activities discussed: Current Exercise Habits: Home exercise routine, Type of exercise: walking;treadmill;strength training/weights;Other - see comments, Time (Minutes): 30, Frequency (Times/Week): 3, Weekly Exercise (Minutes/Week): 90   Goals Addressed             This Visit's Progress    Patient Stated       Continue to work on weight        Depression Screen PHQ 2/9 Scores 11/13/2021 06/20/2021 11/07/2020 11/09/2019 10/23/2019 04/24/2019 12/06/2018  PHQ - 2 Score 1 - 3 3 0 1 0  PHQ- 9 Score 2 - 3 8 - 4 -  Exception Documentation - Patient refusal - - - - -    Fall Risk Fall Risk  11/13/2021 06/20/2021 11/07/2020 10/23/2019 10/14/2018  Falls in the past year? 0 0 0 1 0  Number falls in past yr: 0 0 0 - -  Injury with Fall? 0 0 0 1 -  Risk for fall due to : - No Fall Risks Impaired vision;Impaired balance/gait;Impaired mobility - -  Follow up Falls prevention discussed - Falls prevention discussed Falls evaluation completed;Education provided;Falls prevention discussed -    FALL RISK PREVENTION PERTAINING TO THE HOME:  Any stairs in or around the home? Yes  If so, are there any without handrails? No  Home free of loose throw rugs in walkways, pet beds, electrical cords,  etc? Yes   Adequate lighting in your home to reduce risk of falls? Yes   ASSISTIVE DEVICES UTILIZED TO PREVENT FALLS:  Life alert? No  Use of a cane, walker or w/c? No  Grab bars in the bathroom? No  Shower chair or bench in shower? No  Elevated toilet seat or a handicapped toilet? No   TIMED UP AND GO:  Was the test performed? Yes .  Length of time to ambulate 10 feet: 10 sec.   Gait steady and fast without use of assistive device  Cognitive Function: MMSE - Mini Mental State Exam 10/14/2018  Orientation to time 5  Orientation to Place 5  Registration 3  Attention/ Calculation 5  Recall 3  Language- name 2 objects 2  Language- repeat 1  Language- follow 3 step command 3  Language- read & follow direction 1  Write a sentence 1  Copy design 1  Total score 30   Montreal Cognitive Assessment  02/20/2021 03/10/2017 03/10/2016  Visuospatial/ Executive (0/5) 4 5 3   Naming (0/3) 3 3 3   Attention: Read list of digits (0/2) 2 2 2   Attention: Read list of letters (0/1) 1 1 1   Attention: Serial 7 subtraction starting at 100 (0/3) 3 2 3   Language: Repeat phrase (0/2) 0 1 1  Language : Fluency (0/1) 1 1 0  Abstraction (0/2) 2 2 2   Delayed Recall (0/5) 5 5 3   Orientation (0/6) 6 6 6   Total 27 28 24   Adjusted Score (based on education) - - 25   6CIT Screen 11/13/2021 09/27/2017  What Year? 0 points 0 points  What month? 0 points 0 points  What time? 0 points 0 points  Count back from 20 0 points 0 points  Months in reverse 0 points 0 points  Repeat phrase 0 points 6 points  Total Score 0 6    Immunizations Immunization History  Administered Date(s) Administered   PFIZER(Purple Top)SARS-COV-2 Vaccination 01/07/2020, 02/07/2020, 11/06/2020   Pneumococcal Conjugate-13 09/22/2016   Pneumococcal Polysaccharide-23 07/27/2012   Td 11/03/2007   Tdap 11/12/2020   Zoster, Live 10/02/2015    TDAP status: Up to date  Flu Vaccine status: Declined, Education has been provided regarding the  importance of this vaccine but patient still declined. Advised may receive this vaccine at local pharmacy or Health Dept. Aware to provide a copy of the vaccination record if obtained from local pharmacy or Health Dept. Verbalized acceptance and understanding.  Pneumococcal vaccine status: Up to date  Covid-19 vaccine status: Completed vaccines  Qualifies for Shingles Vaccine? Yes   Zostavax completed Yes   Shingrix Completed?: No.    Education has been provided regarding the importance of this vaccine. Patient has been advised to call insurance company to determine out of pocket expense if they have not yet received this vaccine. Advised may also receive vaccine at local pharmacy or Health Dept. Verbalized acceptance and understanding.  Screening Tests Health Maintenance  Topic Date Due   Hepatitis C Screening  Never done   Zoster Vaccines- Shingrix (1 of 2) Never done   COVID-19 Vaccine (4 - Booster for Pfizer series) 01/01/2021   INFLUENZA VACCINE  09/28/2027 (Originally 06/02/2021)   TETANUS/TDAP  11/12/2030   Pneumonia Vaccine 39+ Years old  Completed   DEXA SCAN  Completed   HPV VACCINES  Aged Out    Health Maintenance  Health Maintenance Due  Topic Date Due   Hepatitis C Screening  Never done   Zoster Vaccines- Shingrix (1  of 2) Never done   COVID-19 Vaccine (4 - Booster for Pfizer series) 01/01/2021    Colorectal cancer screening: No longer required.   Mammogram status: Completed 08/25/21. Repeat every year  Bone Density status: Completed 12/09/16. Results reflect: Bone density results: OSTEOPOROSIS. Repeat every 2 years.  Additional Screening:  Hepatitis C Screening: does qualify  Vision Screening: Recommended annual ophthalmology exams for early detection of glaucoma and other disorders of the eye. Is the patient up to date with their annual eye exam?  Yes  Who is the provider or what is the name of the office in which the patient attends annual eye exams? Dr Coralyn Pear  /Dr Eliseo Squires @ [facility]  If pt is not established with a provider, would they like to be referred to a provider to establish care? No .   Dental Screening: Recommended annual dental exams for proper oral hygiene  Community Resource Referral / Chronic Care Management: CRR required this visit?  No   CCM required this visit?  No      Plan:     I have personally reviewed and noted the following in the patients chart:   Medical and social history Use of alcohol, tobacco or illicit drugs  Current medications and supplements including opioid prescriptions.  Functional ability and status Nutritional status Physical activity Advanced directives List of other physicians Hospitalizations, surgeries, and ER visits in previous 12 months Vitals Screenings to include cognitive, depression, and falls Referrals and appointments  In addition, I have reviewed and discussed with patient certain preventive protocols, quality metrics, and best practice recommendations. A written personalized care plan for preventive services as well as general preventive health recommendations were provided to patient.     Willette Brace, LPN   [id number]   Nurse Notes: pt is requesting a referral to a psychologist related to some stress and depression stating she would rather not see a counselor.  Pt stated no feelings of wanting to harm self or others Please advise

## 2021-11-14 ENCOUNTER — Encounter: Payer: Self-pay | Admitting: Family Medicine

## 2021-11-14 ENCOUNTER — Ambulatory Visit (INDEPENDENT_AMBULATORY_CARE_PROVIDER_SITE_OTHER): Payer: Medicare HMO | Admitting: Family Medicine

## 2021-11-14 VITALS — BP 128/76 | HR 76 | Temp 98.0°F | Ht 63.0 in | Wt 176.2 lb

## 2021-11-14 DIAGNOSIS — E2839 Other primary ovarian failure: Secondary | ICD-10-CM | POA: Diagnosis not present

## 2021-11-14 DIAGNOSIS — Z0001 Encounter for general adult medical examination with abnormal findings: Secondary | ICD-10-CM

## 2021-11-14 DIAGNOSIS — G4700 Insomnia, unspecified: Secondary | ICD-10-CM

## 2021-11-14 DIAGNOSIS — N3281 Overactive bladder: Secondary | ICD-10-CM | POA: Diagnosis not present

## 2021-11-14 DIAGNOSIS — Z6831 Body mass index (BMI) 31.0-31.9, adult: Secondary | ICD-10-CM

## 2021-11-14 DIAGNOSIS — F338 Other recurrent depressive disorders: Secondary | ICD-10-CM | POA: Diagnosis not present

## 2021-11-14 DIAGNOSIS — E785 Hyperlipidemia, unspecified: Secondary | ICD-10-CM | POA: Diagnosis not present

## 2021-11-14 DIAGNOSIS — F419 Anxiety disorder, unspecified: Secondary | ICD-10-CM

## 2021-11-14 DIAGNOSIS — R739 Hyperglycemia, unspecified: Secondary | ICD-10-CM

## 2021-11-14 DIAGNOSIS — R82998 Other abnormal findings in urine: Secondary | ICD-10-CM | POA: Diagnosis not present

## 2021-11-14 LAB — COMPREHENSIVE METABOLIC PANEL
ALT: 20 U/L (ref 0–35)
AST: 21 U/L (ref 0–37)
Albumin: 4.3 g/dL (ref 3.5–5.2)
Alkaline Phosphatase: 61 U/L (ref 39–117)
BUN: 14 mg/dL (ref 6–23)
CO2: 28 mEq/L (ref 19–32)
Calcium: 9.7 mg/dL (ref 8.4–10.5)
Chloride: 100 mEq/L (ref 96–112)
Creatinine, Ser: 0.91 mg/dL (ref 0.40–1.20)
GFR: 60.03 mL/min (ref >60.00)
Glucose, Bld: 99 mg/dL (ref 70–99)
Potassium: 4.4 mEq/L (ref 3.5–5.1)
Sodium: 137 mEq/L (ref 135–145)
Total Bilirubin: 0.7 mg/dL (ref 0.2–1.2)
Total Protein: 7.2 g/dL (ref 6.0–8.3)

## 2021-11-14 LAB — URINALYSIS, ROUTINE W REFLEX MICROSCOPIC
Bilirubin Urine: NEGATIVE
Hgb urine dipstick: NEGATIVE
Ketones, ur: NEGATIVE
Nitrite: NEGATIVE
Specific Gravity, Urine: <=1.005 — AB (ref 1.000–1.030)
Total Protein, Urine: NEGATIVE
Urine Glucose: NEGATIVE
Urobilinogen, UA: 0.2 (ref 0.0–1.0)
pH: 6.5 (ref 5.0–8.0)

## 2021-11-14 LAB — HEMOGLOBIN A1C: Hgb A1c MFr Bld: 5.9 % (ref 4.6–6.5)

## 2021-11-14 LAB — LIPID PANEL
Cholesterol: 182 mg/dL (ref 0–200)
HDL: 52.7 mg/dL (ref >39.00)
LDL Cholesterol: 91 mg/dL (ref 0–99)
NonHDL: 129.53
Total CHOL/HDL Ratio: 3
Triglycerides: 195 mg/dL — ABNORMAL HIGH (ref 0.0–149.0)
VLDL: 39 mg/dL (ref 0.0–40.0)

## 2021-11-14 LAB — CBC
HCT: 41.3 % (ref 36.0–46.0)
Hemoglobin: 13.5 g/dL (ref 12.0–15.0)
MCHC: 32.8 g/dL (ref 30.0–36.0)
MCV: 92.6 fl (ref 78.0–100.0)
Platelets: 232 10*3/uL (ref 150.0–400.0)
RBC: 4.46 Mil/uL (ref 3.87–5.11)
RDW: 13.8 % (ref 11.5–15.5)
WBC: 4.6 10*3/uL (ref 4.0–10.5)

## 2021-11-14 LAB — TSH: TSH: 2.07 u[IU]/mL (ref 0.35–5.50)

## 2021-11-14 MED ORDER — DIAZEPAM 5 MG PO TABS: 5.0000 mg | ORAL_TABLET | Freq: Four times a day (QID) | ORAL | 5 refills | Status: DC | PRN

## 2021-11-14 NOTE — Assessment & Plan Note (Signed)
We will refer to psychiatry as above.  No SI or HI.

## 2021-11-14 NOTE — Assessment & Plan Note (Signed)
Check A1c. 

## 2021-11-14 NOTE — Progress Notes (Signed)
Chief Complaint:  Nicole Holt is a 81 y.o. female who presents today for her annual comprehensive physical exam.    Assessment/Plan:  Chronic Problems Addressed Today: Anxiety Not well controlled.  No SI or HI.  She is on Valium 5 mg 3 times daily as needed.  This time a year is difficult for her due to her ex-husband's suicide and parents death around this time of year.  She also has some seasonal affective disorder.  She requests referral to psychiatry.  Will place referral today.  Discussed reasons to return to care.  Overactive bladder Will refer to urogynecology.  Exam deferred for today.  We will check UA and urine culture.  Seasonal affective disorder [facility]) We will refer to psychiatry as above.  No SI or HI.  Hyperglycemia Check A1c.   Dyslipidemia Check labs.   Preventative Healthcare: Check labs.  She will need COVID booster.  We will check DEXA later this year.  Due for next colon cancer screening in a couple of years.  Patient Counseling(The following topics were reviewed and/or handout was given):  -Nutrition: Stressed importance of moderation in sodium/caffeine intake, saturated fat and cholesterol, caloric balance, sufficient intake of fresh fruits, vegetables, and fiber.  -Stressed the importance of regular exercise.   -Substance Abuse: Discussed cessation/primary prevention of tobacco, alcohol, or other drug use; driving or other dangerous activities under the influence; availability of treatment for abuse.   -Injury prevention: Discussed safety belts, safety helmets, smoke detector, smoking near bedding or upholstery.   -Sexuality: Discussed sexually transmitted diseases, partner selection, use of condoms, avoidance of unintended pregnancy and contraceptive alternatives.   -Dental health: Discussed importance of regular tooth brushing, flossing, and dental visits.  -Health maintenance and immunizations reviewed. Please refer to Health maintenance  section.  Return to care in 1 year for next preventative visit.     Subjective:  HPI:  She has no acute complaints today.  See A/P for status of chronic conditions.  Lifestyle Diet: Balanced.  Exercise: Working out more at [employer].   Depression screen [facility] 2/9 11/13/2021  Decreased Interest 0  Down, Depressed, Hopeless 1  PHQ - 2 Score 1  Altered sleeping 1  Tired, decreased energy -  Change in appetite 0  Feeling bad or failure about yourself  0  Trouble concentrating 0  Moving slowly or fidgety/restless 0  Suicidal thoughts 0  PHQ-9 Score 2  Difficult doing work/chores Somewhat difficult  Some recent data might be hidden   Health Maintenance Due  Topic Date Due   Hepatitis C Screening  Never done   Zoster Vaccines- Shingrix (1 of 2) Never done   COVID-19 Vaccine (4 - Booster for Pfizer series) 01/01/2021    ROS: Per HPI, otherwise a complete review of systems was negative.   PMH:  The following were reviewed and entered/updated in epic: Past Medical History:  Diagnosis Date   Allergy    Anxiety    panic attack   Depression    GERD (gastroesophageal reflux disease)    Glaucoma    Glaucoma    Hearing loss    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    Osteoporosis    Sleep apnea  Tinnitus    Patient Active Problem List   Diagnosis Date Noted   Overactive bladder 11/14/2021   Osteoarthritis 06/20/2021   Bilateral hearing loss 02/20/2021   Gastroesophageal reflux disease with esophagitis without hemorrhage 02/20/2021   Seasonal affective disorder ([place]) 11/08/2019   PND (post-nasal drip) 10/30/2019   Sleep related choking sensation 10/30/2019   Insomnia 08/31/2019   Allergic rhinitis 04/24/2019   Hyperglycemia 10/19/2018   Constipation 10/14/2018    Rectal bleeding 10/14/2018   Dyslipidemia 05/24/2018   Rosacea 09/18/2015   S/P cataract surgery 09/18/2015   OSA on CPAP 02/02/2014   Tremor, essential 02/02/2014   UARS (upper airway resistance syndrome) 02/02/2014   Osteoporosis 07/28/2012   Glaucoma 07/28/2012   Restless leg syndrome 07/28/2012   Anxiety 07/28/2012   Benign positional vertigo 07/28/2012   Tinnitus 07/27/2012   Hearing loss 07/27/2012   Basal cell cancer 07/27/2012   Past Surgical History:  Procedure Laterality Date   AIR/FLUID EXCHANGE Right 09/12/2020   Procedure: AIR/FLUID EXCHANGE;  Surgeon: Bernarda Caffey, MD;  Location: [place];  Service: Ophthalmology;  Laterality: Right;   CATARACT EXTRACTION Bilateral OD: 10.25.21 OS: 12.2015   Dr. Venetia Maxon   cataract surgery     COSMETIC SURGERY     EYE SURGERY Bilateral    Cat Sx - Dr. Venetia Maxon   EYE SURGERY Right 09/12/2020   PPV/EL for retained lens material - Dr. Bernarda Caffey   PARS PLANA VITRECTOMY Right 09/12/2020   Procedure: PARS PLANA VITRECTOMY WITH 23 GAUGE;  Surgeon: Bernarda Caffey, MD;  Location: [place];  Service: Ophthalmology;  Laterality: Right;   PHOTOCOAGULATION WITH LASER Right 09/12/2020   Procedure: PHOTOCOAGULATION WITH LASER;  Surgeon: Bernarda Caffey, MD;  Location: [place];  Service: Ophthalmology;  Laterality: Right;   REMOVAL RETAINED LENS Right 09/12/2020   Procedure: REMOVAL RETAINED LENS;  Surgeon: Bernarda Caffey, MD;  Location: [place];  Service: Ophthalmology;  Laterality: Right;    Family History  Problem Relation Age of Onset   Osteoporosis Mother    Stroke Mother    Hyperlipidemia Mother    Hypertension Mother    Kidney disease Father    Congestive Heart Failure Father    Heart disease Father    Hyperlipidemia Father    Stroke Sister    Parkinson's disease Sister    Heart disease Sister    Heart disease Maternal Grandmother    Gout Maternal Grandmother    Stroke Maternal Grandfather    Stroke Paternal Grandmother    Thrombosis  Paternal Grandfather    Early death Paternal Grandfather    Hypertension Brother    Diabetes Brother    Arthritis/Rheumatoid Other    Parkinson's disease Other    Heart disease Other    Obesity Daughter    Breast cancer Neg Hx     Medications- reviewed and updated Current Outpatient Medications  Medication Sig Dispense Refill   Calcium Carbonate-Vit D-Min (QC CALCIUM-MAGNESIUM-ZINC-D3 PO) Take 1 tablet by mouth once a week.     diazepam (VALIUM) 5 MG tablet Take 1 tablet (5 mg total) by mouth every 6 (six) hours as needed. for anxiety 30 tablet 5   fluticasone (FLONASE) 50 MCG/ACT nasal spray Use 2 spray(s) in each nostril once daily 16 g 0   latanoprost (XALATAN) 0.005 % ophthalmic solution      melatonin 3 MG TABS tablet Take 3 mg by mouth at bedtime as needed (sleep).     polyethylene glycol (MIRALAX / GLYCOLAX) 17 g packet Take 17  g by mouth daily as needed.     Turmeric (QC TUMERIC COMPLEX PO) Take 1 capsule by mouth daily.      No current facility-administered medications for this visit.    Allergies-reviewed and updated Allergies  Allergen Reactions   Codeine Nausea Only   Estrogens Conjugated Other (See Comments)    HA   Paroxetine Hcl Other (See Comments)    Felt like she was "out of her head"    Social History   Socioeconomic History   Marital status: Married    Spouse name: Legrand Como   Number of children: 2   Years of education: 16   Highest education level: Some college, no degree  Occupational History   Occupation: Retired  Tobacco Use   Smoking status: Former   Smokeless tobacco: Never   Tobacco comments:    Quit in [place] Use   Vaping Use: Never used  Substance and Sexual Activity   Alcohol use: Yes    Alcohol/week: 12.0 standard drinks    Types: 12 Glasses of wine per week    Comment: wine or alcohol daily   Drug use: No   Sexual activity: Yes    Birth control/protection: Post-menopausal  Other Topics Concern   Not on file  Social  History Narrative   Patient is married Legrand Como) and lives with her husband.   Patient is retired.   Patient has two children.   Patient is right-handed.   Patient has a college education.   Patient drinks two cups of coffee daily.            Social Determinants of Health   Financial Resource Strain: Low Risk    Difficulty of Paying Living Expenses: Not hard at all  Food Insecurity: No Food Insecurity   Worried About Charity fundraiser in the Last Year: Never true   [place] in the Last Year: Never true  Transportation Needs: No Transportation Needs   Lack of Transportation (Medical): No   Lack of Transportation (Non-Medical): No  Physical Activity: Insufficiently Active   Days of Exercise per Week: 3 days   Minutes of Exercise per Session: 30 min  Stress: Stress Concern Present   Feeling of Stress : Rather much  Social Connections: Moderately Isolated   Frequency of Communication with Friends and Family: More than three times a week   Frequency of Social Gatherings with Friends and Family: More than three times a week   Attends Religious Services: Never   Marine scientist or Organizations: No   Attends Music therapist: Never   Marital Status: Married        Objective:  Physical Exam: BP 128/76 (BP Location: Left Arm, Patient Position: Sitting, Cuff Size: Normal)    Pulse 76    Temp 98 F (36.7 C) (Temporal)    Ht 5\' 3"  (1.6 m)    Wt 176 lb 3.2 oz (79.9 kg)    SpO2 98%    BMI 31.21 kg/m   Body mass index is 31.21 kg/m. Wt Readings from Last 3 Encounters:  11/14/21 176 lb 3.2 oz (79.9 kg)  11/13/21 176 lb (79.8 kg)  06/20/21 172 lb 12.8 oz (78.4 kg)   Gen: NAD, resting comfortably HEENT: TMs normal bilaterally. OP clear. No thyromegaly noted.  CV: RRR with no murmurs appreciated Pulm: NWOB, CTAB with no crackles, wheezes, or rhonchi GI: Normal bowel sounds present. Soft, Nontender, Nondistended. MSK: no edema, cyanosis, or clubbing  noted  Skin: warm, dry Neuro: CN2-12 grossly intact. Strength 5/5 in upper and lower extremities. Reflexes symmetric and intact bilaterally.  Psych: Normal affect and thought content     Yannick Steuber M. Jerline Pain, MD 11/14/2021 11:31 AM

## 2021-11-14 NOTE — Patient Instructions (Addendum)
It was very nice to see you today!  We will check blood work today.   I will refill your medication.   I will refer you to see a psychiatrist and a urogynecologist.  We will see you back in year.  Come back sooner if needed.  Take care, Dr Jerline Pain  PLEASE NOTE:  If you had any lab tests please let us know if you have not heard back within a few days. You may see your results on mychart before we have a chance to review them but we will give you a call once they are reviewed by [place]. If we ordered any referrals today, please let us know if you have not heard from their office within the next week.   Please try these tips to maintain a healthy lifestyle:  Eat at least 3 REAL meals and 1-2 snacks per day.  Aim for no more than 5 hours between eating.  If you eat breakfast, please do so within one hour of getting up.   Each meal should contain half fruits/vegetables, one quarter protein, and one quarter carbs (no bigger than a computer mouse)  Cut down on sweet beverages. This includes juice, soda, and sweet tea.   Drink at least 1 glass of water with each meal and aim for at least 8 glasses per day  Exercise at least 150 minutes every week.    Preventive Care 64 Years and Older, Female Preventive care refers to lifestyle choices and visits with your health care provider that can promote health and wellness. Preventive care visits are also called wellness exams. What can I expect for my preventive care visit? Counseling Your health care provider may ask you questions about your: Medical history, including: Past medical problems. Family medical history. Pregnancy and menstrual history. History of falls. Current health, including: Memory and ability to understand (cognition). Emotional well-being. Home life and relationship well-being. Sexual activity and sexual health. Lifestyle, including: Alcohol, nicotine or tobacco, and drug use. Access to firearms. Diet, exercise, and  sleep habits. Work and work Statistician. Sunscreen use. Safety issues such as seatbelt and bike helmet use. Physical exam Your health care provider will check your: Height and weight. These may be used to calculate your BMI (body mass index). BMI is a measurement that tells if you are at a healthy weight. Waist circumference. This measures the distance around your waistline. This measurement also tells if you are at a healthy weight and may help predict your risk of certain diseases, such as type 2 diabetes and high blood pressure. Heart rate and blood pressure. Body temperature. Skin for abnormal spots. What immunizations do I need? Vaccines are usually given at various ages, according to a schedule. Your health care provider will recommend vaccines for you based on your age, medical history, and lifestyle or other factors, such as travel or where you work. What tests do I need? Screening Your health care provider may recommend screening tests for certain conditions. This may include: Lipid and cholesterol levels. Hepatitis C test. Hepatitis B test. HIV (human immunodeficiency virus) test. STI (sexually transmitted infection) testing, if you are at risk. Lung cancer screening. Colorectal cancer screening. Diabetes screening. This is done by checking your blood sugar (glucose) after you have not eaten for a while (fasting). Mammogram. Talk with your health care provider about how often you should have regular mammograms. BRCA-related cancer screening. This may be done if you have a family history of breast, ovarian, tubal, or peritoneal cancers. Bone  density scan. This is done to screen for osteoporosis. Talk with your health care provider about your test results, treatment options, and if necessary, the need for more tests. Follow these instructions at home: Eating and drinking  Eat a diet that includes fresh fruits and vegetables, whole grains, lean protein, and low-fat dairy  products. Limit your intake of foods with high amounts of sugar, saturated fats, and salt. Take vitamin and mineral supplements as recommended by your health care provider. Do not drink alcohol if your health care provider tells you not to drink. If you drink alcohol: Limit how much you have to 0-1 drink a day. Know how much alcohol is in your drink. In the [place] one drink equals one 12 oz bottle of beer (355 mL), one 5 oz glass of wine (148 mL), or one 1 oz glass of hard liquor (44 mL). Lifestyle Brush your teeth every morning and night with fluoride toothpaste. Floss one time each day. Exercise for at least 30 minutes 5 or more days each week. Do not use any products that contain nicotine or tobacco. These products include cigarettes, chewing tobacco, and vaping devices, such as e-cigarettes. If you need help quitting, ask your health care provider. Do not use drugs. If you are sexually active, practice safe sex. Use a condom or other form of protection in order to prevent STIs. Take aspirin only as told by your health care provider. Make sure that you understand how much to take and what form to take. Work with your health care provider to find out whether it is safe and beneficial for you to take aspirin daily. Ask your health care provider if you need to take a cholesterol-lowering medicine (statin). Find healthy ways to manage stress, such as: Meditation, yoga, or listening to music. Journaling. Talking to a trusted person. Spending time with friends and family. Minimize exposure to UV radiation to reduce your risk of skin cancer. Safety Always wear your seat belt while driving or riding in a vehicle. Do not drive: If you have been drinking alcohol. Do not ride with someone who has been drinking. When you are tired or distracted. While texting. If you have been using any mind-altering substances or drugs. Wear a helmet and other protective equipment during sports activities. If you  have firearms in your house, make sure you follow all gun safety procedures. What's next? Visit your health care provider once a year for an annual wellness visit. Ask your health care provider how often you should have your eyes and teeth checked. Stay up to date on all vaccines. This information is not intended to replace advice given to you by your health care provider. Make sure you discuss any questions you have with your health care provider. Document Revised: 04/16/2021 Document Reviewed: 04/16/2021 Elsevier Patient Education  [place].

## 2021-11-14 NOTE — Assessment & Plan Note (Signed)
Check labs 

## 2021-11-14 NOTE — Assessment & Plan Note (Signed)
Will refer to urogynecology.  Exam deferred for today.  We will check UA and urine culture.

## 2021-11-14 NOTE — Assessment & Plan Note (Signed)
Not well controlled.  No SI or HI.  She is on Valium 5 mg 3 times daily as needed.  This time a year is difficult for her due to her ex-husband's suicide and parents death around this time of year.  She also has some seasonal affective disorder.  She requests referral to psychiatry.  Will place referral today.  Discussed reasons to return to care.

## 2021-11-15 LAB — URINE CULTURE
MICRO NUMBER:: 12868469
Result:: NO GROWTH
SPECIMEN QUALITY:: ADEQUATE

## 2021-11-17 NOTE — Progress Notes (Signed)
Please inform patient of the following:  Blood sugar and triglycerides are borderline but stable. Do not need to make any changes to her treatment plan at this time. She should continue working on diet and exercise and we can recheck in a year or so.

## 2021-11-27 ENCOUNTER — Encounter: Payer: Self-pay | Admitting: Neurology

## 2021-11-27 ENCOUNTER — Ambulatory Visit: Payer: Medicare HMO | Admitting: Neurology

## 2021-11-27 VITALS — BP 131/80 | HR 65 | Ht 63.0 in | Wt 176.0 lb

## 2021-11-27 DIAGNOSIS — H9193 Unspecified hearing loss, bilateral: Secondary | ICD-10-CM | POA: Diagnosis not present

## 2021-11-27 DIAGNOSIS — Z9989 Dependence on other enabling machines and devices: Secondary | ICD-10-CM

## 2021-11-27 DIAGNOSIS — G4733 Obstructive sleep apnea (adult) (pediatric): Secondary | ICD-10-CM

## 2021-11-27 DIAGNOSIS — G250 Essential tremor: Secondary | ICD-10-CM | POA: Diagnosis not present

## 2021-11-27 DIAGNOSIS — R413 Other amnesia: Secondary | ICD-10-CM | POA: Diagnosis not present

## 2021-11-27 NOTE — Patient Instructions (Signed)

## 2021-11-27 NOTE — Progress Notes (Signed)
[facility]  Provider:  Larey Seat, M D  Referring Provider: Vivi Barrack, MD Primary Care Physician:  Vivi Barrack, MD   [place]   Chief Complaint  Patient presents with   Follow-up    Pt alone, rm 10. Overall doing ok with new CPAP . She has had a rough year but seems to be on the up of everything. DME [employer], machine is new - [employer].      HPI:   Nicole Holt is a 81 y.o. female seen here as a an established patient with OSA on CPAP on 11-27-21,  She got a replacement from [employer]. New machine 10-2020. Mild sleep apnea underlying, more UARS.  The patient has been a highly compliant CPAP user, she does have an older CPAP machine that she keeps at her house for the weekend and today she has usually on weekends use of that machine at [facility].  Her compliance for the home machine however is still excellent it is set with between 5 and 8 cm water pressure with 2 cm EPR, humidifier is set at level 4 ramp start pressure at 4 cmH2O, ramp type over 30 minutes.  Compliance has been 93% and the patient used the machine on average 9 hours and 3 minutes.  The 95th percentile pressure is 6.4 cmH2O and she does have minimal air leakage with a residual AHI of only 0.7/h.  So these are very good numbers she also is not excessively daytime sleepy and endorsed today the Epworth sleepiness score at 8 points.  The patient also has voiced memory concerns in the past and for this reason we have followed with serial tests she scored today on the [facility] Cognitive Assessment 29 out of 30 points. She was most concerned about memory lapses when she was depressed and anxious, and grieving. She had also a lot trauma.          02-20-2021.Mrs. Riga previous CPAP machine was over 38 years old and had to be replaced she received a new [place] for the recalled and older one- this one is a  Radiation protection practitioner.  Her compliance information  states that she has used the device 30 out of 30 days with 8 hours and 90 minutes on average.    She does have an average AHI of 1.0/h only this is an excellent resolution.  She is not in high air leakage.  She is using a dream wear nasal cradle mask.  She has 90 percentile pressure of 8 cmH2O but the window of pressure for her machine is between 5 and 8 cmH2O so ligamenta flava was originally set at 2 but we discussed today that she should increase the humidifier settings in order to combat dry throat.  She has a ramp time of 30 minutes and seems to be doing well with that.   So, my goal today is to actually increase the maximum pressure settings to 10 cm water.  Her sister has PD and dementia, cared for by her husband.    Last year, 2020/02/08- she had 7 deaths in her circle of friends. One family member's body was found in the heat of July outside in her garden after several days.  She has still flash backs, smelled and even tasted the odor when she recalls the image.  Her sister diagnosed with PD in 2012/02/08. She also then had flash backs of her husband's image in 1975-02-08.  He died after a severe  trauma, he had shot himself in her presence to her head. She stayed a widow for 10 years.  She admitted to starting temporarily drinking heavily, and has stopped by now. She has visibly changed, trembling, trembling voice, and facial flushing.  Speech therapy has been completed, her wordfinding has not progressed. Her hearing aids have not optimized her ability to listen, and she may have more recall difficulties.     RV 10-30-2019.  She is hard of hearing and would rather me using a face-shield. She reports a lot of broken veins in her legs. I encouraged her to see PCP for that. Her speech has been less fluent , more stammering.  She has been listening to great courses on CD ROM, established a day by day routine.  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  She became tearful  when she spoke about lonely Christmas. Her sister has PD, and is ' pitiful" in declining health. Her grandson, whom she adores is now 72 years old and came over on Christmas Day. She failed on social distancing by her own accord. At the [place] is born on 15-Apr-1941, she is using a C-Flex [employer] machine her download for the last 30 days shows 100% compliance no day without use of an average use at time of 9 hours 29 minutes and 59 seconds.  Average time in airleak is 2 minutes at night her average AHI is 0.7 and her CPAP is set at the lowest possible pressure of 5 cm water.  The geriatric depression score was endorsed at 8 out of 15 points indicative of clinical depression, her Epworth Sleepiness Scale was endorsed at 6 out of 24 points and she does not feel necessarily fatigue but she acknowledges that she has a hard time getting out of bed in the morning. The CPAP machine was issued 09-27-2012.       CD -Revisit on 03-14-2018 She was originally sent by PA  Jerline Pain for OSA work up, now followed for CPAP compliance.  I had not seen Mrs. Wooden since 2017 and in the meantime she has followed with our nurse practitioner here for regular compliance checks.  Today she endorsed the fatigue severity score of 12 points, the Epworth sleepiness score is 3 points, the geriatric depression score at 0 points.  Her C-Flex machine has been followed by [facility] And she received her machine  5.5 years ago and could get a new one. Has one nocturia at 3 AM. Sleeps 6-7 hours at night. No dry mouth nor headaches. No choking or snoring.   This CPAP still works, she has been highly compliant, 100% with a average use of time on all days of 8 hours and 50 minutes nightly, she has a residual AHI of only 0.6 with a CPAP setting of only 5 cm water.  This patient had a very mild apnea and mainly upper airway resistance syndrome which is treated with low pressure CPAP.  She never required oxygen, and she has been feeling less  sleepy and less fatigued, clearly stating a benefit of CPAP therapy. She has still sinus post nasal drip.     Mrs. Guercio originally presented upon referral of Dr. Laney Pastor in 2013 . She is a right handed , chronically hoarse ,caucasian female patient , who  had reportedly loudly snoring and often woke herself from her snoring. She also woke up with a dry mouth and felt not  rested or restored in the mornings.  She gained weight , became less active.  Prior to her sleep evaluation she endorsed getting 6-7 hours of nocturnal sleep.  She also has suffered from allergic rhinitis and used an Teaching laboratory technician. Her grandchildren had reportedly been bothered by her loud snoring , which made her seek medical advice. She endorsed the Epworth score at 14 of 21 points at the time ( 2013) . The patient's sleep study in November 2013 documented AHI of 9.2 and RDI of 18, not SPLIT -  but she was hypoxic for over one hour at night.  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  She returned for a CPAP auto- titration and seemed to respond best to only 5 cm water pressure. 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 As she started on CPAP later that year , her rhinitis improved and she had no further sinusitis exacerbations.  She goes to bed at 11 Pm and falls asleep within 5 minutes, has a bathroom break at 2.30 AM and rarely another at 4.30. Unless she has a lot on her mind, she will easily fall asleep again.  Total sleep time is now 7-8 hours nightly, no daytime naps.  Her Epworth sleepiness score had been reduced after she initiated CPAP he was and is currently at 6 points her fatigue severity score it on the 18 points , both greatly reduced. . Her geriatric depression score was at 3 points,   not indicative of depression.  Unable today to review the patient's compliance record she has 100% compliance on 180 date download, dated 02-02-2014. The nightly use of 7 hours 55 minutes with CPAP her residual AHI is 0.9 at that but CPAP pressure of 5 cm water. She loves her CPAP ! No neck , face or ENT surgery history .   Interval history since the patient underwent a CPAP titration on 09-27-12 as been followed yearly in our office.  Today is 03-12-15, the patient is currently under [facility] coverage and uses the only available DME, preop. She has a CPAP machine by [employer] system 1 with heated humidifier,  coiled air tubing and uses a S-M size nasal mask. She has still other size liners, but only this one fits.  She needs to reorder this size through upper area. Her compliance information is available today downloaded in office on with a date 03-11-15 she's 100% compliant forward 30 out of 30 days of use 100% for over 4 hours of daily use with an average user time of 8 hours and 17 minutes. Her average AHI is 0.8 and a CPAP pressure of only 5 cm water. Her speech is non fluent at the beginning of today's visit, but improved. She looks for words.   Review of Systems: Out of a complete 14 system review, the patient complains of only the following symptoms, and all other reviewed systems are negative.  Her speech is non fluent at the beginning of today's visit, but improved.  She looks for words.   She writes fluently.   UARS - very mild OSA- can't sleep without CPAP.  8 cm water worked well for her.nasal cradle or pillow is her preferred interface.    Her 68 year old sister suffers from Parkinson's disease, falls all the time.  How likely are you to doze in the following situations: 0 = not likely, 1 = slight chance, 2 = moderate chance, 3 = high chance  Sitting and Reading? Watching Television? Sitting inactive in a public place (theater or meeting)? Lying down in  the afternoon when circumstances permit? Sitting and talking to someone? Sitting quietly after lunch without alcohol? In a car, while stopped for a few minutes in traffic? As a passenger in a car for an hour without a break?  Total = 8/ 24   FSS: 21/ 63 points.    Today she endorsed the Epworth sleepiness score is 1 point,  the geriatric depression score at 0 points.   Her Phillips CPAP machine has been followed by [facility] Romans.       Social History   Socioeconomic History   Marital status: Married    Spouse name: Legrand Como   Number of children: 2   Years of education: 16   Highest education level: Some college, no degree  Occupational History   Occupation: Retired  Tobacco Use   Smoking status: Former   Smokeless tobacco: Never   Tobacco comments:    Quit in [place] Use   Vaping Use: Never used  Substance and Sexual Activity   Alcohol use: Yes    Alcohol/week: 12.0 standard drinks    Types: 12 Glasses of wine per week    Comment: wine or alcohol daily   Drug use: No   Sexual activity: Yes    Birth control/protection: Post-menopausal  Other Topics Concern   Not on file  Social History Narrative   Patient is married Legrand Como) and lives with her husband.   Patient is retired.   Patient has two children.   Patient is right-handed.   Patient has a college education.   Patient drinks two cups of coffee daily.            Social Determinants of Health   Financial Resource Strain: Low Risk    Difficulty of Paying Living Expenses: Not hard at all  Food Insecurity: No Food Insecurity   Worried About Charity fundraiser in the Last Year: Never true   [place] in the Last Year: Never true  Transportation Needs: No Transportation Needs   Lack of Transportation (Medical): No   Lack of Transportation (Non-Medical): No  Physical Activity: Insufficiently Active   Days of Exercise per Week: 3 days   Minutes of Exercise per Session: 30 min  Stress: Stress  Concern Present   Feeling of Stress : Rather much  Social Connections: Moderately Isolated   Frequency of Communication with Friends and Family: More than three times a week   Frequency of Social Gatherings with Friends and Family: More than three times a week   Attends Religious Services: Never   Marine scientist or Organizations: No   Attends Music therapist: Never   Marital Status: Married  Human resources officer Violence: Not At Risk   Fear of Current or Ex-Partner: No   Emotionally Abused: No   Physically Abused: No   Sexually Abused: No    Family History  Problem Relation Age of Onset   Osteoporosis Mother    Stroke Mother    Hyperlipidemia Mother    Hypertension Mother    Kidney disease Father    Congestive Heart Failure Father    Heart disease Father    Hyperlipidemia Father    Stroke Sister    Parkinson's disease Sister  Heart disease Sister    Heart disease Maternal Grandmother    Gout Maternal Grandmother    Stroke Maternal Grandfather    Stroke Paternal Grandmother    Thrombosis Paternal Grandfather    Early death Paternal Grandfather    Hypertension Brother    Diabetes Brother    Arthritis/Rheumatoid Other    Parkinson's disease Other    Heart disease Other    Obesity Daughter    Breast cancer Neg Hx     Past Medical History:  Diagnosis Date   Allergy    Anxiety    panic attack   Depression    GERD (gastroesophageal reflux disease)    Glaucoma    Glaucoma    Hearing loss    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    Osteoporosis    Sleep apnea    Tinnitus     Past Surgical History:  Procedure Laterality Date   AIR/FLUID EXCHANGE Right 09/12/2020   Procedure: AIR/FLUID EXCHANGE;  Surgeon: Bernarda Caffey, MD;  Location: [place];   Service: Ophthalmology;  Laterality: Right;   CATARACT EXTRACTION Bilateral OD: 10.25.21 OS: 12.2015   Dr. Venetia Maxon   cataract surgery     COSMETIC SURGERY     EYE SURGERY Bilateral    Cat Sx - Dr. Venetia Maxon   EYE SURGERY Right 09/12/2020   PPV/EL for retained lens material - Dr. Bernarda Caffey   PARS PLANA VITRECTOMY Right 09/12/2020   Procedure: PARS PLANA VITRECTOMY WITH 23 GAUGE;  Surgeon: Bernarda Caffey, MD;  Location: [place];  Service: Ophthalmology;  Laterality: Right;   PHOTOCOAGULATION WITH LASER Right 09/12/2020   Procedure: PHOTOCOAGULATION WITH LASER;  Surgeon: Bernarda Caffey, MD;  Location: [place];  Service: Ophthalmology;  Laterality: Right;   REMOVAL RETAINED LENS Right 09/12/2020   Procedure: REMOVAL RETAINED LENS;  Surgeon: Bernarda Caffey, MD;  Location: [place];  Service: Ophthalmology;  Laterality: Right;    Current Outpatient Medications  Medication Sig Dispense Refill   Calcium Carbonate-Vit D-Min (QC CALCIUM-MAGNESIUM-ZINC-D3 PO) Take 1 tablet by mouth once a week.     diazepam (VALIUM) 5 MG tablet Take 1 tablet (5 mg total) by mouth every 6 (six) hours as needed. for anxiety 30 tablet 5   fluticasone (FLONASE) 50 MCG/ACT nasal spray Use 2 spray(s) in each nostril once daily 16 g 0   latanoprost (XALATAN) 0.005 % ophthalmic solution      melatonin 3 MG TABS tablet Take 3 mg by mouth at bedtime as needed (sleep).     polyethylene glycol (MIRALAX / GLYCOLAX) 17 g packet Take 17 g by mouth daily as needed.     Turmeric (QC TUMERIC COMPLEX PO) Take 1 capsule by mouth daily.      No current facility-administered medications for this visit.    Allergies as of 11/27/2021 - Review Complete 11/27/2021  Allergen Reaction Noted   Codeine Nausea Only 01/18/2012   Estrogens conjugated Other (See Comments) 01/18/2012   Paroxetine hcl Other (See Comments) 01/18/2012    Vitals: BP 131/80    Pulse 65    Ht 5\' 3"  (1.6 m)    Wt 176 lb (79.8 kg)    BMI 31.18 kg/m  Last Weight:  Wt  Readings  from Last 1 Encounters:  11/27/21 176 lb (79.8 kg)   Last Height:   Ht Readings from Last 1 Encounters:  11/27/21 5\' 3"  (1.6 m)    Physical exam:  General: The patient is awake, alert and appears not in acute distress.  The patient is well groomed. Has dysphonia.   Montreal Cognitive Assessment Blind 11/27/2021 02/20/2021 03/10/2017 03/10/2016  Attention: Read list of digits (0/2) 2 2 2 2   Attention: Read list of letters (0/1) 1 1 1 1   Attention: Serial 7 subtraction starting at 100 (0/3) 3 3 2 3   Language: Repeat phrase (0/2) 2 0 1 1  Language : Fluency (0/1) 1 1 1  0  Abstraction (0/2) 2 2 2 2   Delayed Recall (0/5) 4 5 5 3   Orientation (0/6) 6 6 6 6   Total 29 29 - -    Head: Normocephalic, atraumatic.  Neck is supple. Mallampati 2 , neck circumference:15.0, has a mild tongue tremor,  jaw tremor, tremulous speech, worse when frustrated or nervous.   Cardiovascular:  Regular rate and rhythm, without murmurs or carotid bruit, and without distended neck veins. Respiratory: Lungs are clear to auscultation.Skin: tanned.  Without evidence of edema, or rash. No ankle edema. Facial rosacea.  Trunk: BMI is elevated 31.1 kg/m2.  This patient has normal posture.  Neurologic exam : The patient is awake and alert, oriented to place and time.  There is a normal attention span & concentration ability.  Speech is non- fluent, she struggles today with fluency,  is mid-sentence lost for words.  Mood and affect are extroverted, cooperative.   She feels easily in tears. She is easily choked up.  Cranial nerves: Pupils are reactive to light. The patient just underwent a left-sided lensectomy.  She does have a slightly disrounded left pupil - wears no longer corrective lenses which allows her to not wear glasses.  Funduscopic exam without evidence of pallor or edema.  Extraocular movements  in vertical and horizontal planes intact and without nystagmus.  Visual fields by finger perimetry  are intact.  Hearing corrected by hearing aids bilaterally- she speaks loudly.  Facial motor strength is symmetric. Motor exam:  Normal tone, muscle bulk and symmetric normal strength in all extremities. Sensory:  Fine touch and vibration were felt normal into the ankles and fingers. Coordination:  Finger-to-nose maneuver tested and this is performed with a mild tremor.  Gait and station: Patient walks without assistive device. Strength within normal limits.  Deep tendon reflexes: in the upper and lower extremities are symmetric and intact. Babinski maneuver response is downgoing.   Assessment:  After physical and neurologic examination, review of laboratory studies, imaging, neurophysiology testing and pre-existing records, assessment is   0) Tremor , essential, titubation and vocal, not affecting hand writing.   1) Mild OSA with UARS.  CPAP was recalled and renewed, delivered on 10-2020-  at 5-8 cm water, suprisingly 95% pressure is now at the current 8 cm water pressure- I like to get it up to 10 cm.     100% compliance, well controlled AHI under 1 !  She alternates between 2 machines. .  Humidifier can be self adjusted.   2) OSA  Risk weight has been stabile, lost a few pounds.  BMI 31.2  Irritated throat. Keep the humidification up- and treat for acid reflux.   3) MCI ?  I think its her hearing - Her MOCA was good again 29/30  Went to Speech therapist- This is more a stammering,  and apparently this is the opinion of Speech therapist as well.    Next visit again with [facility] please, in 64 month  ! She will bring her machine.    DME is [facility]. [employer] is [employer].    Larey Seat, MD   10-30-2019 Medical Director of [facility] at [facility],  Fellow of the AASM, ABPN and ABSM.

## 2021-12-04 DIAGNOSIS — Z08 Encounter for follow-up examination after completed treatment for malignant neoplasm: Secondary | ICD-10-CM | POA: Diagnosis not present

## 2021-12-04 DIAGNOSIS — D225 Melanocytic nevi of trunk: Secondary | ICD-10-CM | POA: Diagnosis not present

## 2021-12-04 DIAGNOSIS — L718 Other rosacea: Secondary | ICD-10-CM | POA: Diagnosis not present

## 2021-12-04 DIAGNOSIS — Z85828 Personal history of other malignant neoplasm of skin: Secondary | ICD-10-CM | POA: Diagnosis not present

## 2021-12-04 DIAGNOSIS — L814 Other melanin hyperpigmentation: Secondary | ICD-10-CM | POA: Diagnosis not present

## 2021-12-04 DIAGNOSIS — L570 Actinic keratosis: Secondary | ICD-10-CM | POA: Diagnosis not present

## 2021-12-04 DIAGNOSIS — L821 Other seborrheic keratosis: Secondary | ICD-10-CM | POA: Diagnosis not present

## 2021-12-12 DIAGNOSIS — R3915 Urgency of urination: Secondary | ICD-10-CM | POA: Diagnosis not present

## 2021-12-12 DIAGNOSIS — N3941 Urge incontinence: Secondary | ICD-10-CM | POA: Diagnosis not present

## 2022-01-15 DIAGNOSIS — H401131 Primary open-angle glaucoma, bilateral, mild stage: Secondary | ICD-10-CM | POA: Diagnosis not present

## 2022-01-15 DIAGNOSIS — Z961 Presence of intraocular lens: Secondary | ICD-10-CM | POA: Diagnosis not present

## 2022-01-15 DIAGNOSIS — H524 Presbyopia: Secondary | ICD-10-CM | POA: Diagnosis not present

## 2022-01-15 DIAGNOSIS — H35033 Hypertensive retinopathy, bilateral: Secondary | ICD-10-CM | POA: Diagnosis not present

## 2022-02-13 NOTE — Progress Notes (Shared)
?[facility] Note ? ?02/18/2022 ? ?  ? ?CHIEF COMPLAINT ?Patient presents for No chief complaint on file. ? ? ?HISTORY OF PRESENT ILLNESS: ?Nicole Holt is a 81 y.o. female who presents to the clinic today for:  ? ? ? ? ?Referring physician: ?Vivi Barrack, MD ?[address] ?[place],  [place] [address] ? ?HISTORICAL INFORMATION:  ? ?Selected notes from the [facility] ?Referred by Dr. Venetia Maxon for retained lens material OD ?LEE:  ?Ocular Hx- ?PMH- ?  ? ?CURRENT MEDICATIONS: ?Current Outpatient Medications (Ophthalmic Drugs)  ?Medication Sig  ? latanoprost (XALATAN) 0.005 % ophthalmic solution   ? ?No current facility-administered medications for this visit. (Ophthalmic Drugs)  ? ?Current Outpatient Medications (Other)  ?Medication Sig  ? Calcium Carbonate-Vit D-Min (QC CALCIUM-MAGNESIUM-ZINC-D3 PO) Take 1 tablet by mouth once a week.  ? diazepam (VALIUM) 5 MG tablet Take 1 tablet (5 mg total) by mouth every 6 (six) hours as needed. for anxiety  ? fluticasone (FLONASE) 50 MCG/ACT nasal spray Use 2 spray(s) in each nostril once daily  ? melatonin 3 MG TABS tablet Take 3 mg by mouth at bedtime as needed (sleep).  ? polyethylene glycol (MIRALAX / GLYCOLAX) 17 g packet Take 17 g by mouth daily as needed.  ? Turmeric (QC TUMERIC COMPLEX PO) Take 1 capsule by mouth daily.   ? ?No current facility-administered medications for this visit. (Other)  ? ? ? ? ?REVIEW OF SYSTEMS: ? ? ? ? ?ALLERGIES ?Allergies  ?Allergen Reactions  ? Codeine Nausea Only  ? Estrogens Conjugated Other (See Comments)  ?  HA  ? Paroxetine Hcl Other (See Comments)  ?  Felt like she was "out of her head"  ? ? ?PAST MEDICAL HISTORY ?Past Medical History:  ?Diagnosis Date  ? Allergy   ? Anxiety   ? panic attack  ? Depression   ? GERD (gastroesophageal reflux disease)   ? Glaucoma   ? Glaucoma   ? Hearing loss   ? 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 Removed from nose  ? Hyperlipidemia   ? Hypertensive retinopathy    ? OU  ? Migraines   ? ocular  ? OSA on CPAP 02/02/2014  ? PSG 09-27-2012 RDI 18.9 and AHi 9.8, Epworth 15 .titrated to only 5 cm water.   ? Osteoporosis   ? Sleep apnea   ? Tinnitus   ? ?Past Surgical History:  ?Procedure Laterality Date  ? AIR/FLUID EXCHANGE Right 09/12/2020  ? Procedure: AIR/FLUID EXCHANGE;  Surgeon: Bernarda Caffey, MD;  Location: [facility];  Service: Ophthalmology;  Laterality: Right;  ? CATARACT EXTRACTION Bilateral OD: 10.25.21 OS: 12.2015  ? Dr. Venetia Maxon  ? cataract surgery    ? COSMETIC SURGERY    ? EYE SURGERY Bilateral   ? Cat Sx - Dr. Venetia Maxon  ? EYE SURGERY Right 09/12/2020  ? PPV/EL for retained lens material - Dr. Bernarda Caffey  ? PARS PLANA VITRECTOMY Right 09/12/2020  ? Procedure: PARS PLANA VITRECTOMY WITH 23 GAUGE;  Surgeon: Bernarda Caffey, MD;  Location: [facility];  Service: Ophthalmology;  Laterality: Right;  ? PHOTOCOAGULATION WITH LASER Right 09/12/2020  ? Procedure: PHOTOCOAGULATION WITH LASER;  Surgeon: Bernarda Caffey, MD;  Location: [place] [facility];  Service: Ophthalmology;  Laterality: Right;  ? REMOVAL RETAINED LENS Right 09/12/2020  ? Procedure: REMOVAL RETAINED LENS;  Surgeon: Bernarda Caffey, MD;  Location: [facility];  Service: Ophthalmology;  Laterality: Right;  ? ? ?FAMILY HISTORY ?Family History  ?  Problem Relation Age of Onset  ? Osteoporosis Mother   ? Stroke Mother   ? Hyperlipidemia Mother   ? Hypertension Mother   ? Kidney disease Father   ? Congestive Heart Failure Father   ? Heart disease Father   ? Hyperlipidemia Father   ? Stroke Sister   ? Parkinson's disease Sister   ? Heart disease Sister   ? Heart disease Maternal Grandmother   ? Gout Maternal Grandmother   ? Stroke Maternal Grandfather   ? Stroke Paternal Grandmother   ? Thrombosis Paternal Grandfather   ? Early death Paternal Grandfather   ? Hypertension Brother   ? Diabetes Brother   ? Arthritis/Rheumatoid Other   ? Parkinson's disease Other   ? Heart disease Other   ? Obesity Daughter   ? Breast cancer Neg Hx   ? ? ?SOCIAL  HISTORY ?Social History  ? ?Tobacco Use  ? Smoking status: Former  ? Smokeless tobacco: Never  ? Tobacco comments:  ?  Quit in 1987  ?Vaping Use  ? Vaping Use: Never used  ?Substance Use Topics  ? Alcohol use: Yes  ?  Alcohol/week: 12.0 standard drinks  ?  Types: 12 Glasses of wine per week  ?  Comment: wine or alcohol daily  ? Drug use: No  ? ?  ? ?  ? ?OPHTHALMIC EXAM: ? ?Not recorded ?  ? ? ?IMAGING AND PROCEDURES  ?Imaging and Procedures for 02/18/2022 ? ? ? ? ?  ?  ? ?  ?ASSESSMENT/PLAN: ? ?No diagnosis found. ? ? ?1. Retained lens material OD ? - s/p CE, sulcus IOL on Monday 10.25.21 ? - broken posterior capsule, nuclear and cortical lens fragments in vitreous ? - had IOP spike to 40 on 11.3.21 ? - improved post drops, AC tap and diamox by Dr. Venetia Maxon ? - persistently elevated IOP and microcystic corneal edema ? - s/p PPV/EL OD 11.11.21 for removal of retained lens material ?            - doing well ? - VA 20/20 OD -- stable ?            - IOP good at 13 ? - trace residual cortical remnants at 0630 -- completely resolved today ? - latanoprost qhs OU ? - continue ATs 2-3x/day OD -- pt states she hasn't been using ? - f/u 1 year, DFE, OCT ? ?2. No retinal edema on exam or OCT ? ?3,4. Hypertensive retinopathy OU ?- discussed importance of tight BP control ?- monitor ? ?5. Pseudophakia OU ? - s/p CE/IOL (Dr. Venetia Maxon, OD: 10.25.21, OS: 53.6644) ? - IOLs in good position ? - retained lens material OD as above ?- monitor ? ?6. History of POAG ? - was managed by Dr. Venetia Maxon -- pt wishes to transfer providers ? - IOP 13,17 on latan qhs OU ? - will refer to Dr. Kathlen Mody for glaucoma eval and management ? ? ?Ophthalmic Meds Ordered this visit:  ?No orders of the defined types were placed in this encounter. ? ? ?  ? ?No follow-ups on file. ? ?There are no Patient Instructions on file for this visit. ? ? ?Explained the diagnoses, plan, and follow up with the patient and they expressed understanding.  Patient expressed  understanding of the importance of proper follow up care.  ? ?This document serves as a record of services personally performed by Gardiner Sleeper, MD, PhD. It was created on their behalf by Orvan Falconer, an ophthalmic technician.  The creation of this record is the provider's dictation and/or activities during the visit.   ? ?Electronically signed by: Orvan Falconer, OA, 02/13/22  9:36 AM ? ? ? ?Gardiner Sleeper, M.D., Ph.D. ?Diseases & Surgery of the Retina and Vitreous ?[place] ? ?I have reviewed the above documentation for accuracy and completeness, and I agree with the above. Gardiner Sleeper, M.D., Ph.D. 02/20/21 9:36 AM ? ?Abbreviations: ?M myopia (nearsighted); A astigmatism; H hyperopia (farsighted); P presbyopia; Mrx spectacle prescription;  CTL contact lenses; OD right eye; OS left eye; OU both eyes  XT exotropia; ET esotropia; PEK punctate epithelial keratitis; PEE punctate epithelial erosions; DES dry eye syndrome; MGD meibomian gland dysfunction; ATs artificial tears; PFAT's preservative free artificial tears; Muir nuclear sclerotic cataract; PSC posterior subcapsular cataract; ERM epi-retinal membrane; PVD posterior vitreous detachment; RD retinal detachment; DM diabetes mellitus; DR diabetic retinopathy; NPDR non-proliferative diabetic retinopathy; PDR proliferative diabetic retinopathy; CSME clinically significant macular edema; DME diabetic macular edema; dbh dot blot hemorrhages; CWS cotton wool spot; POAG primary open angle glaucoma; C/D cup-to-disc ratio; HVF humphrey visual field; GVF goldmann visual field; OCT optical coherence tomography; IOP intraocular pressure; BRVO Branch retinal vein occlusion; CRVO central retinal vein occlusion; CRAO central retinal artery occlusion; BRAO branch retinal artery occlusion; RT retinal tear; SB scleral buckle; PPV pars plana vitrectomy; VH Vitreous hemorrhage; PRP panretinal laser photocoagulation; IVK intravitreal kenalog; VMT  vitreomacular traction; MH Macular hole;  NVD neovascularization of the disc; NVE neovascularization elsewhere; AREDS age related eye disease study; ARMD age related macular degeneration; POAG primary open ang

## 2022-02-18 ENCOUNTER — Encounter (INDEPENDENT_AMBULATORY_CARE_PROVIDER_SITE_OTHER): Payer: Self-pay

## 2022-02-18 ENCOUNTER — Encounter (INDEPENDENT_AMBULATORY_CARE_PROVIDER_SITE_OTHER): Payer: Medicare HMO | Admitting: Ophthalmology

## 2022-02-18 ENCOUNTER — Encounter (INDEPENDENT_AMBULATORY_CARE_PROVIDER_SITE_OTHER): Payer: Self-pay | Admitting: Ophthalmology

## 2022-02-18 ENCOUNTER — Ambulatory Visit (INDEPENDENT_AMBULATORY_CARE_PROVIDER_SITE_OTHER): Payer: Medicare HMO | Admitting: Ophthalmology

## 2022-02-18 DIAGNOSIS — H401130 Primary open-angle glaucoma, bilateral, stage unspecified: Secondary | ICD-10-CM | POA: Diagnosis not present

## 2022-02-18 DIAGNOSIS — H40051 Ocular hypertension, right eye: Secondary | ICD-10-CM

## 2022-02-18 DIAGNOSIS — Z961 Presence of intraocular lens: Secondary | ICD-10-CM

## 2022-02-18 DIAGNOSIS — I10 Essential (primary) hypertension: Secondary | ICD-10-CM

## 2022-02-18 DIAGNOSIS — H59021 Cataract (lens) fragments in eye following cataract surgery, right eye: Secondary | ICD-10-CM

## 2022-02-18 DIAGNOSIS — H35033 Hypertensive retinopathy, bilateral: Secondary | ICD-10-CM

## 2022-02-18 DIAGNOSIS — H35363 Drusen (degenerative) of macula, bilateral: Secondary | ICD-10-CM | POA: Diagnosis not present

## 2022-02-18 NOTE — Progress Notes (Signed)
?[facility] Note ? ?02/18/2022 ? ?  ? ?CHIEF COMPLAINT ?Patient presents for Retina Follow Up ? ? ?HISTORY OF PRESENT ILLNESS: ?Nicole Holt is a 81 y.o. female who presents to the clinic today for:  ? ?HPI   ? ? Retina Follow Up   ?Patient presents with  Other.  In both eyes.  Duration of 1 year.  Since onset it is stable. ? ?  ?  ? ? Comments   ?1 year follow up Retained lens material OD-  Patient seen Dr. Lucianne Lei last month, everything was looking good.  IOP is improving.  Taking Latanoprost qhs OU.  Vision appears stable OU.  ? ?  ?  ?Last edited by Leonie Douglas, COA on 02/18/2022  1:50 PM.  ?  ?Pt saw Dr. Lucianne Lei last month and got a good report, she will see her again in 6 months and have a VF, ? ? ?Referring physician: ?Vivi Barrack, MD ?[address] ?[place],  [place] [address] ? ?HISTORICAL INFORMATION:  ? ?Selected notes from the [facility] ?Referred by Dr. Venetia Maxon for retained lens material OD ?LEE:  ?Ocular Hx- ?PMH- ?  ? ?CURRENT MEDICATIONS: ?Current Outpatient Medications (Ophthalmic Drugs)  ?Medication Sig  ? latanoprost (XALATAN) 0.005 % ophthalmic solution   ? ?No current facility-administered medications for this visit. (Ophthalmic Drugs)  ? ?Current Outpatient Medications (Other)  ?Medication Sig  ? Calcium Carbonate-Vit D-Min (QC CALCIUM-MAGNESIUM-ZINC-D3 PO) Take 1 tablet by mouth once a week.  ? diazepam (VALIUM) 5 MG tablet Take 1 tablet (5 mg total) by mouth every 6 (six) hours as needed. for anxiety  ? fluticasone (FLONASE) 50 MCG/ACT nasal spray Use 2 spray(s) in each nostril once daily  ? melatonin 3 MG TABS tablet Take 3 mg by mouth at bedtime as needed (sleep).  ? polyethylene glycol (MIRALAX / GLYCOLAX) 17 g packet Take 17 g by mouth daily as needed.  ? Turmeric (QC TUMERIC COMPLEX PO) Take 1 capsule by mouth daily.   ? ?No current facility-administered medications for this visit. (Other)  ? ?REVIEW OF SYSTEMS: ?ROS   ?Positive for: Neurological,  Musculoskeletal, HENT, Eyes, Respiratory ?Negative for: Constitutional, Gastrointestinal, Skin, Genitourinary, Endocrine, Cardiovascular, Psychiatric, Allergic/Imm, Heme/Lymph ?Last edited by Leonie Douglas, COA on 02/18/2022  1:50 PM.  ?  ? ?ALLERGIES ?Allergies  ?Allergen Reactions  ? Codeine Nausea Only  ? Estrogens Conjugated Other (See Comments)  ?  HA  ? Paroxetine Hcl Other (See Comments)  ?  Felt like she was "out of her head"  ? ?PAST MEDICAL HISTORY ?Past Medical History:  ?Diagnosis Date  ? Allergy   ? Anxiety   ? panic attack  ? Depression   ? GERD (gastroesophageal reflux disease)   ? Glaucoma   ? Glaucoma   ? Hearing loss   ? 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 Removed from nose  ? Hyperlipidemia   ? Hypertensive retinopathy   ? OU  ? Migraines   ? ocular  ? OSA on CPAP 02/02/2014  ? PSG 09-27-2012 RDI 18.9 and AHi 9.8, Epworth 15 .titrated to only 5 cm water.   ? Osteoporosis   ? Sleep apnea   ? Tinnitus   ? ?Past Surgical History:  ?Procedure Laterality Date  ? AIR/FLUID EXCHANGE Right 09/12/2020  ? Procedure: AIR/FLUID EXCHANGE;  Surgeon: Bernarda Caffey, MD;  Location: [facility];  Service: Ophthalmology;  Laterality: Right;  ? CATARACT EXTRACTION Bilateral OD: 10.25.21 OS: 12.2015  ?  Dr. Venetia Maxon  ? cataract surgery    ? COSMETIC SURGERY    ? EYE SURGERY Bilateral   ? Cat Sx - Dr. Venetia Maxon  ? EYE SURGERY Right 09/12/2020  ? PPV/EL for retained lens material - Dr. Bernarda Caffey  ? PARS PLANA VITRECTOMY Right 09/12/2020  ? Procedure: PARS PLANA VITRECTOMY WITH 23 GAUGE;  Surgeon: Bernarda Caffey, MD;  Location: [facility];  Service: Ophthalmology;  Laterality: Right;  ? PHOTOCOAGULATION WITH LASER Right 09/12/2020  ? Procedure: PHOTOCOAGULATION WITH LASER;  Surgeon: Bernarda Caffey, MD;  Location: [facility];  Service: Ophthalmology;  Laterality: Right;  ? REMOVAL RETAINED LENS Right 09/12/2020  ? Procedure: REMOVAL RETAINED LENS;  Surgeon: Bernarda Caffey, MD;  Location: [facility];  Service: Ophthalmology;  Laterality:  Right;  ? ?FAMILY HISTORY ?Family History  ?Problem Relation Age of Onset  ? Osteoporosis Mother   ? Stroke Mother   ? Hyperlipidemia Mother   ? Hypertension Mother   ? Kidney disease Father   ? Congestive Heart Failure Father   ? Heart disease Father   ? Hyperlipidemia Father   ? Stroke Sister   ? Parkinson's disease Sister   ? Heart disease Sister   ? Heart disease Maternal Grandmother   ? Gout Maternal Grandmother   ? Stroke Maternal Grandfather   ? Stroke Paternal Grandmother   ? Thrombosis Paternal Grandfather   ? Early death Paternal Grandfather   ? Hypertension Brother   ? Diabetes Brother   ? Arthritis/Rheumatoid Other   ? Parkinson's disease Other   ? Heart disease Other   ? Obesity Daughter   ? Breast cancer Neg Hx   ? ?SOCIAL HISTORY ?Social History  ? ?Tobacco Use  ? Smoking status: Former  ? Smokeless tobacco: Never  ? Tobacco comments:  ?  Quit in 1987  ?Vaping Use  ? Vaping Use: Never used  ?Substance Use Topics  ? Alcohol use: Yes  ?  Alcohol/week: 12.0 standard drinks  ?  Types: 12 Glasses of wine per week  ?  Comment: wine or alcohol daily  ? Drug use: No  ?  ? ?  ?OPHTHALMIC EXAM: ? ?Base Eye Exam   ? ? Visual Acuity (Snellen - Linear)   ? ?   Right Left  ? Dist Seibert 20/20 20/60 +2  ? Dist ph Violet  20/25  ? ?  ?  ? ? Tonometry (Tonopen, 1:58 PM)   ? ?   Right Left  ? Pressure 20 18  ? ?  ?  ? ? Pupils   ? ?   Dark Light Shape React APD  ? Right 3 2 Round Slow None  ? Left 3 2 Round Slow None  ? ?  ?  ? ? Visual Fields (Counting fingers)   ? ?   Left Right  ?  Full Full  ? ?  ?  ? ? Extraocular Movement   ? ?   Right Left  ?  Full Full  ? ?  ?  ? ? Neuro/Psych   ? ? Oriented x3: Yes  ? Mood/Affect: Normal  ? ?  ?  ? ? Dilation   ? ? Both eyes: 1.0% Mydriacyl, 2.5% Phenylephrine @ 1:58 PM  ? ?  ?  ? ?  ? ?Slit Lamp and Fundus Exam   ? ? Slit Lamp Exam   ? ?   Right Left  ? Lids/Lashes Dermatochalasis - upper lid Dermatochalasis - upper lid  ? Conjunctiva/Sclera 1-2+ Injection  White and quiet  ? Cornea  Trace Punctate epithelial erosions, well healed temporal cataract wound, mild arcus Trace Punctate epithelial erosions, well healed temporal cataract wounds  ? Anterior Chamber Deep and quiet Deep and quiet, Deep  ? Iris Round and dilated, mild iridodenesis Round and dilated  ? Lens 3 piece sulcus IOL in good position, open PC, cortical remnants resolved PC IOL in good position with trace PCO non-central  ? Anterior Vitreous post vitrectomy, small cortical remnant fragment settled inferiorly -- resolved, trace vitreous condensations syneresis  ? ?  ?  ? ? Fundus Exam   ? ?   Right Left  ? Disc mild tilt, mild pallor, +cupping, mild PPP, Sharp rim Pink and Sharp, mild tilt, IT PPA  ? C/D Ratio 0.7 0.6  ? Macula Flat, Blunted foveal reflex, mild RPE mottling, fine drusen, No heme or edema Flat, Blunted foveal reflex, mild RPE mottling, rare, fine drusen, No heme or edema  ? Vessels attenuated, Tortuous attenuated, Tortuous  ? Periphery attached, retained lens material gone; 360 peripheral laser changes, reticular degeneration Attached     ? ?  ?  ? ?  ? ? ?IMAGING AND PROCEDURES  ?Imaging and Procedures for 02/18/2022 ? ?OCT, Retina - OU - Both Eyes   ? ?   ?Right Eye ?Quality was good. Central Foveal Thickness: 302. Progression has been stable. Findings include normal foveal contour, no IRF, no SRF, retinal drusen (Mild progression of drusen greatest SN mac compared to prior).  ? ?Left Eye ?Quality was good. Central Foveal Thickness: 283. Progression has been stable. Findings include normal foveal contour, no IRF, no SRF, retinal drusen .  ? ?Notes ?*Images captured and stored on drive ? ?Diagnosis / Impression:  ?NFP, no IRF/SRF OU ?OD: Mild progression of drusen greatest SN mac compared to prior ? ?Clinical management:  ?See below ? ?Abbreviations: NFP - Normal foveal profile. CME - cystoid macular edema. PED - pigment epithelial detachment. IRF - intraretinal fluid. SRF - subretinal fluid. EZ - ellipsoid zone. ERM  - epiretinal membrane. ORA - outer retinal atrophy. ORT - outer retinal tubulation. SRHM - subretinal hyper-reflective material. IRHM - intraretinal hyper-reflective material ? ?  ?  ?  ? ?  ?ASSESSMENT/PLAN: ?

## 2022-03-27 ENCOUNTER — Other Ambulatory Visit: Payer: Self-pay | Admitting: Family Medicine

## 2022-03-27 DIAGNOSIS — J3489 Other specified disorders of nose and nasal sinuses: Secondary | ICD-10-CM

## 2022-04-15 ENCOUNTER — Telehealth: Payer: Self-pay | Admitting: Family Medicine

## 2022-04-15 NOTE — Telephone Encounter (Signed)
Patient has been scheduled for 04/16/22 at 11am with Inda Coke.

## 2022-04-15 NOTE — Telephone Encounter (Signed)
Patient Name: Nicole Holt RD Gender: Female DOB: 11-28-1940 Age: 81 Y 10 M 13 D Return Phone Number: [phone] (Primary), [phone] (Secondary) Address: City/ State/ Zip: [place]  [address] Client [facility] Provider Dimas Chyle- MD Contact Type Call Who Is Calling Patient / Member / Family / Caregiver Call Type Triage / Clinical Relationship To Patient Self Return Phone Number [phone] (Primary) Chief Complaint Blood In Stool Reason for Call Symptomatic / Request for Health Information Initial Comment Caller was transferred over from the office, has blood in the stool. Translation No Nurse Assessment Nurse: Vallery Sa, RN, Cathy Date/Time (Eastern Time): 04/15/2022 10:29:19 AM Confirm and document reason for call. If symptomatic, describe symptoms. ---[name] states she has had blood in her stool on and off the past year that became worse again this morning. No fever. No pain. Alert and responsive. Does the patient have any new or worsening symptoms? ---Yes Will a triage be completed? ---Yes Related visit to physician within the last 2 weeks? ---No Does the PT have any chronic conditions? (i.e. diabetes, asthma, this includes High risk factors for pregnancy, etc.) ---Yes List chronic conditions. ---Urinary problems, Constipation Is this a behavioral health or substance abuse call? ---No Guidelines Guideline Title Affirmed Question Affirmed Notes Nurse Date/Time (Eastern Time) Rectal Bleeding MODERATE rectal bleeding (small blood clots, passing blood without stool, or toilet water turns red) [place], RN, Cathy 04/15/2022 10:33:35 AM Disp. Time Eilene Ghazi Time) Disposition Final User PLEASE NOTE: All timestamps contained within this report are represented as [place] Standard Time. CONFIDENTIALTY NOTICE: This fax transmission is intended only for the addressee.  It contains information that is legally privileged, confidential or otherwise protected from use or disclosure. If you are not the intended recipient, you are strictly prohibited from reviewing, disclosing, copying using or disseminating any of this information or taking any action in reliance on or regarding this information. If you have received this fax in error, please notify us immediately by telephone so that we can arrange for its return to [place]. Phone: [phone], Toll-Free: [phone], Fax: [phone] Page: 2 of 2 Call Id: [id number] 04/15/2022 10:37:26 AM See PCP within 24 Hours Yes Vallery Sa, RN, Rosey Bath Disagree/Comply Comply Caller Understands Yes PreDisposition Call Doctor Care Advice Given Per Guideline SEE PCP WITHIN 24 HOURS: * IF OFFICE WILL BE OPEN: You need to be examined within the next 24 hours. Call your doctor (or NP/PA) when the office opens and make an appointment. CALL BACK IF: * Dizziness occurs * Bleeding increases * You become worse CARE ADVICE given per Rectal Bleeding (Adult) guideline. Comments User: Berton Mount, RN Date/Time Eilene Ghazi Time): 04/15/2022 10:38:39 AM Transferred to the office to check on appointment options. Encouraged to call back as needed. Referrals REFERRED TO PCP OFFICE

## 2022-04-15 NOTE — Telephone Encounter (Signed)
Patient need an OV with any available provider for referral and evaluation

## 2022-04-15 NOTE — Telephone Encounter (Signed)
FYI, see Triage note. Pt scheduled tomorrow.

## 2022-04-15 NOTE — Telephone Encounter (Signed)
Patient called stating she would like to have a referral to GI for a colonscopy. Reports she has been having bloody stools occasionally with some coagulation. Upon learning this information and also the fact that she had a bloody BM this morning, I have sent pt to triage for further evaluation.

## 2022-04-16 ENCOUNTER — Telehealth: Payer: Self-pay | Admitting: Family Medicine

## 2022-04-16 ENCOUNTER — Ambulatory Visit (INDEPENDENT_AMBULATORY_CARE_PROVIDER_SITE_OTHER): Payer: Medicare HMO | Admitting: Physician Assistant

## 2022-04-16 ENCOUNTER — Encounter: Payer: Self-pay | Admitting: Physician Assistant

## 2022-04-16 ENCOUNTER — Ambulatory Visit
Admission: RE | Admit: 2022-04-16 | Discharge: 2022-04-16 | Disposition: A | Discharge status: Home / Self Care | Payer: Medicare HMO | Source: Ambulatory Visit | Attending: Family Medicine | Admitting: Family Medicine

## 2022-04-16 VITALS — BP 128/73 | HR 60 | Temp 98.2°F | Ht 63.0 in | Wt 176.4 lb

## 2022-04-16 DIAGNOSIS — M810 Age-related osteoporosis without current pathological fracture: Secondary | ICD-10-CM | POA: Diagnosis not present

## 2022-04-16 DIAGNOSIS — M85852 Other specified disorders of bone density and structure, left thigh: Secondary | ICD-10-CM | POA: Diagnosis not present

## 2022-04-16 DIAGNOSIS — K625 Hemorrhage of anus and rectum: Secondary | ICD-10-CM | POA: Diagnosis not present

## 2022-04-16 DIAGNOSIS — Z780 Asymptomatic menopausal state: Secondary | ICD-10-CM | POA: Diagnosis not present

## 2022-04-16 DIAGNOSIS — E2839 Other primary ovarian failure: Secondary | ICD-10-CM

## 2022-04-16 LAB — CBC WITH DIFFERENTIAL/PLATELET
Basophils Absolute: 0 10*3/uL (ref 0.0–0.1)
Basophils Relative: 0.4 % (ref 0.0–3.0)
Eosinophils Absolute: 0.1 10*3/uL (ref 0.0–0.7)
Eosinophils Relative: 1.7 % (ref 0.0–5.0)
HCT: 43.4 % (ref 36.0–46.0)
Hemoglobin: 14.3 g/dL (ref 12.0–15.0)
Lymphocytes Relative: 38.6 % (ref 12.0–46.0)
Lymphs Abs: 2.3 10*3/uL (ref 0.7–4.0)
MCHC: 33.1 g/dL (ref 30.0–36.0)
MCV: 93.5 fl (ref 78.0–100.0)
Monocytes Absolute: 0.5 10*3/uL (ref 0.1–1.0)
Monocytes Relative: 8.1 % (ref 3.0–12.0)
Neutro Abs: 3 10*3/uL (ref 1.4–7.7)
Neutrophils Relative %: 51.2 % (ref 43.0–77.0)
Platelets: 251 10*3/uL (ref 150.0–400.0)
RBC: 4.64 Mil/uL (ref 3.87–5.11)
RDW: 14.2 % (ref 11.5–15.5)
WBC: 6 10*3/uL (ref 4.0–10.5)

## 2022-04-16 NOTE — Telephone Encounter (Signed)
Faxed received Sign and faxed to [phone] Copy placed to be scan in patient chart

## 2022-04-16 NOTE — Telephone Encounter (Signed)
No form received. Spoke with Nicole Holt, will re faxed today

## 2022-04-16 NOTE — Progress Notes (Signed)
Nicole Holt is a 81 y.o. female here for a follow up of rectal bleeding.   History of Present Illness:   Chief Complaint  Patient presents with  . Rectal Bleeding    Pt stated that she seen blood in the water which started back in May and then some blood in the stool this morning    HPI  Rectal bleeding  Patient complain of blood in stool that has been onset for past few months. States she had similar issue about few months ago. Reports 2-3 months ago had a feeling of BM that caused her to push really hard and had noticed blood clot after this. She states she also noticed blood in stool after this blood clot. She has been using Miralax with prune juice everyday to help with BM. She states she has not been having BM regularly. No urinary symptoms. Denies: abdominal pain, unintentional weight loss, n/v/d, lightheadedness, dizziness. Does regularly have constipation. States she had colonoscopy in 2020 with Dr. Watt Climes at [facility].    Past Medical History:  Diagnosis Date  . Allergy   . Anxiety    panic attack  . Depression   . GERD (gastroesophageal reflux disease)   . Glaucoma   . Glaucoma   . Hearing loss   . 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 Hyperlipidemia   . Hypertensive retinopathy    OU  . Migraines    ocular  . OSA on CPAP 02/02/2014   PSG 09-27-2012 RDI 18.9 and AHi 9.8, Epworth 15 .titrated to only 5 cm water.   . Osteoporosis   . Sleep apnea   . Tinnitus      Social History   Tobacco Use  . Smoking status: Former  . Smokeless tobacco: Never  . Tobacco comments:    Quit in [place] Use  . Vaping Use: Never used  Substance Use Topics  . Alcohol use: Yes    Alcohol/week: 12.0 standard drinks of alcohol    Types: 12 Glasses of wine per week    Comment: wine or alcohol daily  . Drug use: No    Past Surgical History:  Procedure Laterality Date  . AIR/FLUID EXCHANGE Right 09/12/2020   Procedure: AIR/FLUID EXCHANGE;  Surgeon:  Bernarda Caffey, MD;  Location: [facility];  Service: Ophthalmology;  Laterality: Right;  . CATARACT EXTRACTION Bilateral OD: 10.25.21 OS: 12.2015   Dr. Venetia Maxon  . cataract surgery    . COSMETIC SURGERY    . EYE SURGERY Bilateral    Cat Sx - Dr. Venetia Maxon  . EYE SURGERY Right 09/12/2020   PPV/EL for retained lens material - Dr. Bernarda Caffey  . PARS PLANA VITRECTOMY Right 09/12/2020   Procedure: PARS PLANA VITRECTOMY WITH 23 GAUGE;  Surgeon: Bernarda Caffey, MD;  Location: [facility];  Service: Ophthalmology;  Laterality: Right;  . PHOTOCOAGULATION WITH LASER Right 09/12/2020   Procedure: PHOTOCOAGULATION WITH LASER;  Surgeon: Bernarda Caffey, MD;  Location: [facility];  Service: Ophthalmology;  Laterality: Right;  . REMOVAL RETAINED LENS Right 09/12/2020   Procedure: REMOVAL RETAINED LENS;  Surgeon: Bernarda Caffey, MD;  Location: [facility];  Service: Ophthalmology;  Laterality: Right;    Family History  Problem Relation Age of Onset  . Osteoporosis Mother   . Stroke Mother   . Hyperlipidemia Mother   . Hypertension Mother   . Kidney disease Father   . Congestive Heart Failure Father   . Heart disease Father   .  Hyperlipidemia Father   . Stroke Sister   . Parkinson's disease Sister   . Heart disease Sister   . Heart disease Maternal Grandmother   . Gout Maternal Grandmother   . Stroke Maternal Grandfather   . Stroke Paternal Grandmother   . Thrombosis Paternal Grandfather   . Early death Paternal Grandfather   . Hypertension Brother   . Diabetes Brother   . Arthritis/Rheumatoid Other   . Parkinson's disease Other   . Heart disease Other   . Obesity Daughter   . Breast cancer Neg Hx     Allergies  Allergen Reactions  . Codeine Nausea Only  . Estrogens Conjugated Other (See Comments)    HA  . Paroxetine Hcl Other (See Comments)    Felt like she was "out of her head"    Current Medications:   Current Outpatient Medications:  .  Calcium Carbonate-Vit D-Min (QC CALCIUM-MAGNESIUM-ZINC-D3 PO),  Take 1 tablet by mouth once a week., Disp: , Rfl:  .  diazepam (VALIUM) 5 MG tablet, Take 1 tablet (5 mg total) by mouth every 6 (six) hours as needed. for anxiety, Disp: 30 tablet, Rfl: 5 .  fluticasone (FLONASE) 50 MCG/ACT nasal spray, Use 2 spray(s) in each nostril once daily, Disp: 16 g, Rfl: 0 .  latanoprost (XALATAN) 0.005 % ophthalmic solution, , Disp: , Rfl:  .  melatonin 3 MG TABS tablet, Take 3 mg by mouth at bedtime as needed (sleep)., Disp: , Rfl:  .  polyethylene glycol (MIRALAX / GLYCOLAX) 17 g packet, Take 17 g by mouth daily as needed., Disp: , Rfl:  .  Turmeric (QC TUMERIC COMPLEX PO), Take 1 capsule by mouth daily. , Disp: , Rfl:    Review of Systems:   ROS Negative unless otherwise specified per HPI.   Vitals:   Vitals:   04/16/22 1057  BP: (!) 146/84  Pulse: (!) 57  Temp: 98.2 F (36.8 C)  SpO2: 97%  Weight: 176 lb 6.4 oz (80 kg)  Height: '5\' 3"'$  (1.6 m)     Body mass index is 31.25 kg/m.  Physical Exam:   Physical Exam Vitals and nursing note reviewed.  Constitutional:      General: She is not in acute distress.    Appearance: She is well-developed. She is not ill-appearing or toxic-appearing.  Cardiovascular:     Rate and Rhythm: Normal rate and regular rhythm.     Pulses: Normal pulses.     Heart sounds: Normal heart sounds, S1 normal and S2 normal.  Pulmonary:     Effort: Pulmonary effort is normal.     Breath sounds: Normal breath sounds.  Skin:    General: Skin is warm and dry.  Neurological:     Mental Status: She is alert.     GCS: GCS eye subscore is 4. GCS verbal subscore is 5. GCS motor subscore is 6.  Psychiatric:        Speech: Speech normal.        Behavior: Behavior normal. Behavior is cooperative.     Assessment and Plan:   Rectal bleeding Uncontrolled I have sent urgent referral to [facility] CBC to determine if severe blood loss Advised on worsening precautions I also encouraged her to take her miralax daily as well  as attempt to drink 64 oz water daily  I,Savera Zaman,acting as a scribe for [employer], PA.,have documented all relevant documentation on the behalf of Inda Coke, PA,as directed by  Inda Coke, PA while in  the presence of Inda Coke, [place].   I, Inda Coke, [place], have reviewed all documentation for this visit. The documentation on 04/16/22 for the exam, diagnosis, procedures, and orders are all accurate and complete.   Inda Coke, PA-C

## 2022-04-16 NOTE — Telephone Encounter (Signed)
Neviah from the [facility] called in regards to patient's DEXA scan that is scheduled for 04/16/22 at 2pm. She states they are in need of a co-sign for the order and would like to have this completed prior to her visit today. She can be reached at [phone] ext. [phone].

## 2022-04-16 NOTE — Patient Instructions (Addendum)
It was great to see you!  We will refer you to [facility] Address: [address], [place], [place] [address] Open ? Closes 5?PM Phone: [phone]  We will update your hemoglobin today to make sure you haven't lost a lot of blood.  Daily miralax is very important for your right now. Please try to do this consistently.  Please work on 64 oz of water daily.  Take care,  Inda Coke PA-C

## 2022-04-17 DIAGNOSIS — K625 Hemorrhage of anus and rectum: Secondary | ICD-10-CM | POA: Diagnosis not present

## 2022-04-17 DIAGNOSIS — R195 Other fecal abnormalities: Secondary | ICD-10-CM | POA: Diagnosis not present

## 2022-04-17 DIAGNOSIS — K5900 Constipation, unspecified: Secondary | ICD-10-CM | POA: Diagnosis not present

## 2022-04-18 NOTE — Progress Notes (Signed)
Please inform patient of the following:  Her bone density scan shows that she has osteoporosis. Can we have her schedule an appointment to discuss treatment options?  Algis Greenhouse. Jerline Pain, MD 04/18/2022 9:24 AM

## 2022-05-06 ENCOUNTER — Encounter: Payer: Self-pay | Admitting: Family Medicine

## 2022-05-06 ENCOUNTER — Ambulatory Visit (INDEPENDENT_AMBULATORY_CARE_PROVIDER_SITE_OTHER): Payer: Medicare HMO | Admitting: Family Medicine

## 2022-05-06 VITALS — BP 122/60 | HR 60 | Temp 98.0°F | Ht 63.0 in | Wt 176.2 lb

## 2022-05-06 DIAGNOSIS — F419 Anxiety disorder, unspecified: Secondary | ICD-10-CM

## 2022-05-06 DIAGNOSIS — M810 Age-related osteoporosis without current pathological fracture: Secondary | ICD-10-CM

## 2022-05-06 NOTE — Assessment & Plan Note (Signed)
Symptoms are stable on Valium 5 mg 3 times daily as needed.

## 2022-05-06 NOTE — Assessment & Plan Note (Signed)
Had lengthy discussion with patient regarding her most recent DEXA scan which showed T score of -3.1.  We discussed importance of calcium and vitamin D supplementation.  We also discussed medication management options including Fosamax, Prolia, and Reclast.  She is not ready to commit to any medication regimen today and would like to discuss this again in the future office visit.  We discussed pros and cons of each of these though did recommend that she be on some sort of treatment to prevent risk of fracture.  She voiced understanding.  We can readdress at next office visit.

## 2022-05-06 NOTE — Patient Instructions (Signed)
It was very nice to see you today!  Please make sure that you are taking your calcium and vitamin D supplements.  It may be a good idea to start a medication to help rebuild your bone density.  We can discuss this at your next office visit. Options would be a pill called Fosamax, an infusion called Reclast, or an injection called Prolia.  Take care, Dr Jerline Pain  PLEASE NOTE:  If you had any lab tests please let us know if you have not heard back within a few days. You may see your results on mychart before we have a chance to review them but we will give you a call once they are reviewed by [place]. If we ordered any referrals today, please let us know if you have not heard from their office within the next week.   Please try these tips to maintain a healthy lifestyle:  Eat at least 3 REAL meals and 1-2 snacks per day.  Aim for no more than 5 hours between eating.  If you eat breakfast, please do so within one hour of getting up.   Each meal should contain half fruits/vegetables, one quarter protein, and one quarter carbs (no bigger than a computer mouse)  Cut down on sweet beverages. This includes juice, soda, and sweet tea.   Drink at least 1 glass of water with each meal and aim for at least 8 glasses per day  Exercise at least 150 minutes every week.

## 2022-05-06 NOTE — Progress Notes (Signed)
   Nicole Holt is a 81 y.o. female who presents today for an office visit.  Assessment/Plan:  Chronic Problems Addressed Today: Osteoporosis Last DEXA 04/2022 Had lengthy discussion with patient regarding her most recent DEXA scan which showed T score of -3.1.  We discussed importance of calcium and vitamin D supplementation.  We also discussed medication management options including Fosamax, Prolia, and Reclast.  She is not ready to commit to any medication regimen today and would like to discuss this again in the future office visit.  We discussed pros and cons of each of these though did recommend that she be on some sort of treatment to prevent risk of fracture.  She voiced understanding.  We can readdress at next office visit.  Anxiety Symptoms are stable on Valium 5 mg 3 times daily as needed.    Subjective:  HPI:  See A/p for status of chronic conditions. She recently had a DEXA scan that showed osteoporosis.         Objective:  Physical Exam: BP 122/60   Pulse 60   Temp 98 F (36.7 C) (Temporal)   Ht '5\' 3"'$  (1.6 m)   Wt 176 lb 3.2 oz (79.9 kg)   SpO2 97%   BMI 31.21 kg/m   Gen: No acute distress, resting comfortably Neuro: Grossly normal, moves all extremities Psych: Normal affect and thought content      Gerell Fortson M. Jerline Pain, MD 05/06/2022 11:26 AM

## 2022-05-27 DIAGNOSIS — K5900 Constipation, unspecified: Secondary | ICD-10-CM | POA: Diagnosis not present

## 2022-05-27 DIAGNOSIS — K625 Hemorrhage of anus and rectum: Secondary | ICD-10-CM | POA: Diagnosis not present

## 2022-06-17 DIAGNOSIS — N3941 Urge incontinence: Secondary | ICD-10-CM | POA: Diagnosis not present

## 2022-06-30 DIAGNOSIS — Z86010 Personal history of colonic polyps: Secondary | ICD-10-CM | POA: Diagnosis not present

## 2022-06-30 DIAGNOSIS — K5730 Diverticulosis of large intestine without perforation or abscess without bleeding: Secondary | ICD-10-CM | POA: Diagnosis not present

## 2022-06-30 DIAGNOSIS — K649 Unspecified hemorrhoids: Secondary | ICD-10-CM | POA: Diagnosis not present

## 2022-06-30 DIAGNOSIS — K5140 Inflammatory polyps of colon without complications: Secondary | ICD-10-CM | POA: Diagnosis not present

## 2022-06-30 DIAGNOSIS — K921 Melena: Secondary | ICD-10-CM | POA: Diagnosis not present

## 2022-07-20 DIAGNOSIS — H401131 Primary open-angle glaucoma, bilateral, mild stage: Secondary | ICD-10-CM | POA: Diagnosis not present

## 2022-07-30 DIAGNOSIS — K5900 Constipation, unspecified: Secondary | ICD-10-CM | POA: Diagnosis not present

## 2022-07-31 ENCOUNTER — Other Ambulatory Visit: Payer: Self-pay | Admitting: Family Medicine

## 2022-07-31 DIAGNOSIS — Z1231 Encounter for screening mammogram for malignant neoplasm of breast: Secondary | ICD-10-CM

## 2022-08-01 ENCOUNTER — Other Ambulatory Visit: Payer: Self-pay | Admitting: Family Medicine

## 2022-08-01 DIAGNOSIS — F419 Anxiety disorder, unspecified: Secondary | ICD-10-CM

## 2022-08-26 ENCOUNTER — Ambulatory Visit
Admission: RE | Admit: 2022-08-26 | Discharge: 2022-08-26 | Disposition: A | Discharge status: Home / Self Care | Payer: Medicare HMO | Source: Ambulatory Visit | Attending: Family Medicine | Admitting: Family Medicine

## 2022-08-26 DIAGNOSIS — Z1231 Encounter for screening mammogram for malignant neoplasm of breast: Secondary | ICD-10-CM | POA: Diagnosis not present

## 2022-11-04 ENCOUNTER — Telehealth: Payer: Self-pay | Admitting: Family Medicine

## 2022-11-04 NOTE — Telephone Encounter (Signed)
Patient requests TOC from Dr. Jerline Pain to Inda Coke stating she feels more comfortable with a female Provider (especially for female related issues and Physicals).  Please advise.

## 2022-11-05 NOTE — Telephone Encounter (Signed)
[place] with me.  Algis Greenhouse. Jerline Pain, MD 11/05/2022 7:28 AM

## 2022-11-06 NOTE — Telephone Encounter (Signed)
Patient is scheduled for TOC on 11/24/22

## 2022-11-16 ENCOUNTER — Ambulatory Visit (INDEPENDENT_AMBULATORY_CARE_PROVIDER_SITE_OTHER): Payer: Medicare HMO

## 2022-11-16 VITALS — BP 122/70 | HR 73 | Temp 97.3°F | Wt 178.2 lb

## 2022-11-16 DIAGNOSIS — Z0000 Encounter for general adult medical examination without abnormal findings: Secondary | ICD-10-CM | POA: Diagnosis not present

## 2022-11-16 NOTE — Patient Instructions (Signed)
[name] , Thank you for taking time to come for your Medicare Wellness Visit. I appreciate your ongoing commitment to your health goals. Please review the following plan we discussed and let me know if I can assist you in the future.   These are the goals we discussed:  Goals      Patient Stated     Get back to being able to walk more      Patient Stated     Continue to work on [employer]      [place] (see longitudinal plan of care for additional care plan information)  Current Barriers:  Chronic Disease Management support, education, and care coordination needs related to Hyperlipidemia and Anxiety   Hyperlipidemia Lab Results  Component Value Date/Time   LDLCALC 102 (H) 11/08/2019 11:47 AM   LDLCALC 102 (H) 09/27/2017 10:57 AM   LDLDIRECT 116.0 10/14/2018 12:06 PM  Pharmacist Clinical Goal(s): Over the next 180 days, patient will work with PharmD and providers to achieve LDL goal < 100 Current regimen:  Diet and exercise (as tolerated) alone Interventions: Reviewed dietary recommendations including focus on fruits/vegetables, white meats, salt avoidance, limiting sweets.  Recommend substituting nuts over carb rich snacks such as crackers Patient self care activities - Over the next 180 days, patient will: Continue current management Anxiety Pharmacist Clinical Goal(s) Over the next 180 days, patient will work with PharmD and providers to minimize symptoms of anxiety and ensure medication safety Current regimen:  Diazepam every 6 hours as needed Interventions: Reviewed potential options for antianxiety pharmacotherapy, counseling Patient self care activities - Over the next 180 days, patient will: Continue current management Let office know if she is wanting additional support for anxiety  Medication management Pharmacist Clinical Goal(s): Over the next 180 days, patient will work with PharmD and providers to maintain optimal medication  adherence Current pharmacy: [facility] Interventions Comprehensive medication review performed. Continue current medication management strategy Patient self care activities - Over the next 180 days, patient will: Take medications as prescribed Report any questions or concerns to PharmD and/or provider(s) Initial goal documentation.     Reduce alcohol intake     Patient states that she wants to try to decrease her alcohol intake daily.         This is a list of the screening recommended for you and due dates:  Health Maintenance  Topic Date Due   Zoster (Shingles) Vaccine (1 of 2) Never done   COVID-19 Vaccine (4 - 2023-24 season) 07/03/2022   Flu Shot  09/28/2027*   Medicare Annual Wellness Visit  11/17/2023   DTaP/Tdap/Td vaccine (3 - Td or Tdap) 11/12/2030   Pneumonia Vaccine  Completed   DEXA scan (bone density measurement)  Completed   HPV Vaccine  Aged Out  *Topic was postponed. The date shown is not the original due date.    Advanced directives: copies in chart   Conditions/risks identified: contin ue working on sobriety   Next appointment: Follow up in one year for your annual wellness visit    Preventive Care 65 Years and Older, Female Preventive care refers to lifestyle choices and visits with your health care provider that can promote health and wellness. What does preventive care include? A yearly physical exam. This is also called an annual well check. Dental exams once or twice a year. Routine eye exams. Ask your health care provider how often you should have your eyes checked. Personal lifestyle choices,  including: Daily care of your teeth and gums. Regular physical activity. Eating a healthy diet. Avoiding tobacco and drug use. Limiting alcohol use. Practicing safe sex. Taking low-dose aspirin every day. Taking vitamin and mineral supplements as recommended by your health care provider. What happens during an annual well check? The services and  screenings done by your health care provider during your annual well check will depend on your age, overall health, lifestyle risk factors, and family history of disease. Counseling  Your health care provider may ask you questions about your: Alcohol use. Tobacco use. Drug use. Emotional well-being. Home and relationship well-being. Sexual activity. Eating habits. History of falls. Memory and ability to understand (cognition). Work and work Statistician. Reproductive health. Screening  You may have the following tests or measurements: Height, weight, and BMI. Blood pressure. Lipid and cholesterol levels. These may be checked every 5 years, or more frequently if you are over 48 years old. Skin check. Lung cancer screening. You may have this screening every year starting at age 44 if you have a 30-pack-year history of smoking and currently smoke or have quit within the past 15 years. Fecal occult blood test (FOBT) of the stool. You may have this test every year starting at age 53. Flexible sigmoidoscopy or colonoscopy. You may have a sigmoidoscopy every 5 years or a colonoscopy every 10 years starting at age 42. Hepatitis C blood test. Hepatitis B blood test. Sexually transmitted disease (STD) testing. Diabetes screening. This is done by checking your blood sugar (glucose) after you have not eaten for a while (fasting). You may have this done every 1-3 years. Bone density scan. This is done to screen for osteoporosis. You may have this done starting at age 69. Mammogram. This may be done every 1-2 years. Talk to your health care provider about how often you should have regular mammograms. Talk with your health care provider about your test results, treatment options, and if necessary, the need for more tests. Vaccines  Your health care provider may recommend certain vaccines, such as: Influenza vaccine. This is recommended every year. Tetanus, diphtheria, and acellular pertussis (Tdap,  Td) vaccine. You may need a Td booster every 10 years. Zoster vaccine. You may need this after age 36. Pneumococcal 13-valent conjugate (PCV13) vaccine. One dose is recommended after age 46. Pneumococcal polysaccharide (PPSV23) vaccine. One dose is recommended after age 58. Talk to your health care provider about which screenings and vaccines you need and how often you need them. This information is not intended to replace advice given to you by your health care provider. Make sure you discuss any questions you have with your health care provider. Document Released: 11/15/2015 Document Revised: 07/08/2016 Document Reviewed: 08/20/2015 Elsevier Interactive Patient Education  2017 [place] Prevention in the Home Falls can cause injuries. They can happen to people of all ages. There are many things you can do to make your home safe and to help prevent falls. What can I do on the outside of my home? Regularly fix the edges of walkways and driveways and fix any cracks. Remove anything that might make you trip as you walk through a door, such as a raised step or threshold. Trim any bushes or trees on the path to your home. Use bright outdoor lighting. Clear any walking paths of anything that might make someone trip, such as rocks or tools. Regularly check to see if handrails are loose or broken. Make sure that both sides of any steps have  handrails. Any raised decks and porches should have guardrails on the edges. Have any leaves, snow, or ice cleared regularly. Use sand or salt on walking paths during winter. Clean up any spills in your garage right away. This includes oil or grease spills. What can I do in the bathroom? Use night lights. Install grab bars by the toilet and in the tub and shower. Do not use towel bars as grab bars. Use non-skid mats or decals in the tub or shower. If you need to sit down in the shower, use a plastic, non-slip stool. Keep the floor dry. Clean up any  water that spills on the floor as soon as it happens. Remove soap buildup in the tub or shower regularly. Attach bath mats securely with double-sided non-slip rug tape. Do not have throw rugs and other things on the floor that can make you trip. What can I do in the bedroom? Use night lights. Make sure that you have a light by your bed that is easy to reach. Do not use any sheets or blankets that are too big for your bed. They should not hang down onto the floor. Have a firm chair that has side arms. You can use this for support while you get dressed. Do not have throw rugs and other things on the floor that can make you trip. What can I do in the kitchen? Clean up any spills right away. Avoid walking on wet floors. Keep items that you use a lot in easy-to-reach places. If you need to reach something above you, use a strong step stool that has a grab bar. Keep electrical cords out of the way. Do not use floor polish or wax that makes floors slippery. If you must use wax, use non-skid floor wax. Do not have throw rugs and other things on the floor that can make you trip. What can I do with my stairs? Do not leave any items on the stairs. Make sure that there are handrails on both sides of the stairs and use them. Fix handrails that are broken or loose. Make sure that handrails are as long as the stairways. Check any carpeting to make sure that it is firmly attached to the stairs. Fix any carpet that is loose or worn. Avoid having throw rugs at the top or bottom of the stairs. If you do have throw rugs, attach them to the floor with carpet tape. Make sure that you have a light switch at the top of the stairs and the bottom of the stairs. If you do not have them, ask someone to add them for you. What else can I do to help prevent falls? Wear shoes that: Do not have high heels. Have rubber bottoms. Are comfortable and fit you well. Are closed at the toe. Do not wear sandals. If you use a  stepladder: Make sure that it is fully opened. Do not climb a closed stepladder. Make sure that both sides of the stepladder are locked into place. Ask someone to hold it for you, if possible. Clearly mark and make sure that you can see: Any grab bars or handrails. First and last steps. Where the edge of each step is. Use tools that help you move around (mobility aids) if they are needed. These include: Canes. Walkers. Scooters. Crutches. Turn on the lights when you go into a dark area. Replace any light bulbs as soon as they burn out. Set up your furniture so you have a clear path. Avoid  moving your furniture around. If any of your floors are uneven, fix them. If there are any pets around you, be aware of where they are. Review your medicines with your doctor. Some medicines can make you feel dizzy. This can increase your chance of falling. Ask your doctor what other things that you can do to help prevent falls. This information is not intended to replace advice given to you by your health care provider. Make sure you discuss any questions you have with your health care provider. Document Released: 08/15/2009 Document Revised: 03/26/2016 Document Reviewed: 11/23/2014 Elsevier Interactive Patient Education  2017 [employer].

## 2022-11-16 NOTE — Progress Notes (Signed)
Subjective:   Nicole Holt is a 82 y.o. female who presents for Medicare Annual (Subsequent) preventive examination.  Review of Systems     Cardiac Risk Factors include: advanced age (>30mn, >>62women);dyslipidemia;obesity (BMI >30kg/m2)     Objective:    Today's Vitals   11/16/22 1309  BP: 122/70  Pulse: 73  Temp: (!) 97.3 F (36.3 C)  SpO2: 97%  Weight: 178 lb 3.2 oz (80.8 kg)   Body mass index is 31.57 kg/m.     11/16/2022    1:19 PM 11/13/2021   11:20 AM 11/07/2020   11:21 AM 09/12/2020   12:28 PM 10/23/2019    1:22 PM 10/14/2018   10:17 AM 09/27/2017    9:48 AM  Advanced Directives  Does Patient Have a Medical Advance Directive? Yes Yes Yes Yes Yes Yes Yes  Type of [place]aramed[place]of [place] will Healthcare Power of [place]ang[place] PlaceLiving will Living will Living will;Healthcare Power of [place]Living will [place] will  Does patient want to make changes to medical advance directive? No - Patient declined    No - Patient declined No - Patient declined   Copy of HClover[place]in Chart? Yes - validated most recent copy scanned in chart (See row information) Yes - validated most recent copy scanned in chart (See row information) Yes - validated most recent copy scanned in chart (See row information)  Yes - validated most recent copy scanned in chart (See row information) No - copy requested No - copy requested    Current Medications (verified) Outpatient Encounter Medications as of 11/16/2022  Medication Sig   Calcium Carbonate-Vit D-Min (QC CALCIUM-MAGNESIUM-ZINC-D3 PO) Take 1 tablet by mouth once a week.   diazepam (VALIUM) 5 MG tablet TAKE 1 TABLET BY MOUTH EVERY 6 HOURS AS NEEDED FOR ANXIETY   fluticasone (FLONASE) 50 MCG/ACT nasal spray Use 2 spray(s) in each nostril once daily   latanoprost (XALATAN) 0.005 % ophthalmic solution    melatonin 3  MG TABS tablet Take 3 mg by mouth at bedtime as needed (sleep).   polyethylene glycol (MIRALAX / GLYCOLAX) 17 g packet Take 17 g by mouth daily as needed.   Turmeric (QC TUMERIC COMPLEX PO) Take 1 capsule by mouth daily.    No facility-administered encounter medications on file as of 11/16/2022.    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    Osteoporosis    Sleep apnea    Tinnitus    Past Surgical History:  Procedure Laterality Date   AIR/FLUID EXCHANGE Right 09/12/2020   Procedure: AIR/FLUID EXCHANGE;  Surgeon: ZBernarda Caffey MD;  [facility]  Service: Ophthalmology;  Laterality: Right;   CATARACT EXTRACTION Bilateral OD: 10.25.21 OS: 12.2015   Dr. WVenetia Maxon  cataract surgery     COSMETIC SURGERY     EYE SURGERY Bilateral  Cat Sx - Dr. Venetia Maxon   EYE SURGERY Right 09/12/2020   PPV/EL for retained lens material - Dr. Bernarda Caffey   PARS PLANA VITRECTOMY Right 09/12/2020   Procedure: PARS PLANA VITRECTOMY WITH 23 GAUGE;  Surgeon: Bernarda Caffey, MD;  Location: [facility];  Service: Ophthalmology;  Laterality: Right;   PHOTOCOAGULATION WITH LASER Right 09/12/2020   Procedure: PHOTOCOAGULATION WITH LASER;  Surgeon: Bernarda Caffey, MD;  Location: [facility];  Service: Ophthalmology;  Laterality: Right;   REMOVAL RETAINED LENS Right 09/12/2020   Procedure: REMOVAL RETAINED LENS;  Surgeon: Bernarda Caffey, MD;  Location: [facility];  Service: Ophthalmology;  Laterality: Right;   Family History  Problem Relation Age of Onset   Osteoporosis  Mother    Stroke Mother    Hyperlipidemia Mother    Hypertension Mother    Kidney disease Father    Congestive Heart Failure Father    Heart disease Father    Hyperlipidemia Father    Stroke Sister    Parkinson's disease Sister    Heart disease Sister    Heart disease Maternal Grandmother    Gout Maternal Grandmother    Stroke Maternal Grandfather    Stroke Paternal Grandmother    Thrombosis Paternal Grandfather    Early death Paternal Grandfather    Hypertension Brother    Diabetes Brother    Arthritis/Rheumatoid Other    Parkinson's disease Other    Heart disease Other    Obesity Daughter    Breast cancer Neg Hx    Social History   Socioeconomic History   Marital status: Married    Spouse name: Legrand Como   Number of children: 2   Years of education: 16   Highest education level: Some college, no degree  Occupational History   Occupation: Retired  Tobacco Use   Smoking status: Former   Smokeless tobacco: Never   Tobacco comments:    Quit in [place] Use   Vaping Use: Never used  Substance and Sexual Activity   Alcohol use: Yes    Alcohol/week: 12.0 standard drinks of alcohol    Types: 12 Glasses of wine per week    Comment: wine or alcohol daily   Drug use: No   Sexual activity: Yes    Birth control/protection: Post-menopausal  Other Topics Concern   Not on file  Social History Narrative   Patient is married Legrand Como) and lives with her husband.   Patient is retired.   Patient has two children.   Patient is right-handed.   Patient has a college education.   Patient drinks two cups of coffee daily.            Social Determinants of Health   Financial Resource Strain: Low Risk  (11/16/2022)   Overall Financial Resource Strain (CARDIA)    Difficulty of Paying Living Expenses: Not hard at all  Food Insecurity: No Food Insecurity (11/16/2022)   Hunger Vital Sign    Worried About Running Out of Food in the Last Year: Never true    Ran Out of Food in  the Last Year: Never true  Transportation Needs: No Transportation Needs (11/16/2022)   PRAPARE - Hydrologist (Medical): No    Lack of Transportation (Non-Medical): No  Physical Activity: Insufficiently Active (11/16/2022)   Exercise Vital Sign    Days of Exercise per Week: 3 days    Minutes of Exercise per Session: 30 min  Stress: Stress Concern Present (11/16/2022)   [employer]  Occupational Health - Occupational Stress Questionnaire    Feeling of Stress : To some extent  Social Connections: Moderately Isolated (11/16/2022)   Social Connection and Isolation Panel [NHANES]    Frequency of Communication with Friends and Family: More than three times a week    Frequency of Social Gatherings with Friends and Family: More than three times a week    Attends Religious Services: Never    Marine scientist or Organizations: No    Attends Music therapist: Never    Marital Status: Married    Tobacco Counseling Counseling given: Not Answered Tobacco comments: Quit in 1987   Clinical Intake:  Pre-visit preparation completed: Yes  Pain : No/denies pain     BMI - recorded: 31.57 Nutritional Status: BMI > 30  Obese Nutritional Risks: None Diabetes: No  How often do you need to have someone help you when you read instructions, pamphlets, or other written materials from your doctor or pharmacy?: 1 - Never  Diabetic?no  Interpreter Needed?: No  Information entered by :: Charlott Rakes, LPN   Activities of Daily Living    11/16/2022    1:21 PM  In your present state of health, do you have any difficulty performing the following activities:  Hearing? 1  Comment wears hearing aids  Vision? 0  Difficulty concentrating or making decisions? 0  Walking or climbing stairs? 0  Dressing or bathing? 0  Doing errands, shopping? 0  Preparing Food and eating ? N  Using the Toilet? N  In the past six months, have you accidently leaked  urine? Y  Comment at times  Do you have problems with loss of bowel control? N  Managing your Medications? N  Managing your Finances? N  Housekeeping or managing your Housekeeping? N    Patient Care Team: Vivi Barrack, MD as PCP - General (Family Medicine) Dennie Bible, NP as Nurse Practitioner (Neurology) Marylynn Pearson, MD as Consulting Physician (Ophthalmology) Dohmeier, Asencion Partridge, MD as Consulting Physician (Neurology) Dentistry, [facility] Leroy Sea, [employer], [facility] (Inactive) as Pharmacist (Pharmacist)  Indicate any recent Medical Services you may have received from other than [facility] providers in the past year (date may be approximate).     Assessment:   This is a routine wellness examination for Aleesia.  Hearing/Vision screen Hearing Screening - Comments:: Pt has hearing aids  Vision Screening - Comments:: Pt [facility] for annual eye exams   Dietary issues and exercise activities discussed: Current Exercise Habits: Home exercise routine, Type of exercise: walking, Time (Minutes): 30, Frequency (Times/Week): 3, Weekly Exercise (Minutes/Week): 90   Goals Addressed             This Visit's Progress    Patient Stated       Continue working toward sobriety         Depression Screen    11/16/2022    1:17 PM 05/06/2022   11:03 AM 11/13/2021   11:13 AM 06/20/2021   10:51 AM 11/07/2020   11:13 AM 11/09/2019    8:20 AM 10/23/2019    1:25 PM  PHQ 2/9 Scores  PHQ - 2 Score 1 0 '1  3 3 '$ 0  PHQ- 9 Score   '2  3 8   '$ Exception Documentation    Patient refusal       Fall Risk    11/16/2022    1:20 PM 05/06/2022   11:03 AM 11/13/2021   11:21 AM 06/20/2021   10:51 AM 11/07/2020  11:22 AM  Fall Risk   Falls in the past year? 0 0 0 0 0  Number falls in past yr: 0 0 0 0 0  Injury with Fall? 0 0 0 0 0  Risk for fall due to :  No Fall Risks  No Fall Risks Impaired vision;Impaired balance/gait;Impaired mobility  Follow up Falls prevention discussed  Falls  prevention discussed  Falls prevention discussed    FALL RISK PREVENTION PERTAINING TO THE HOME:  Any stairs in or around the home? Yes  If so, are there any without handrails? No  Home free of loose throw rugs in walkways, pet beds, electrical cords, etc? Yes  Adequate lighting in your home to reduce risk of falls? Yes   ASSISTIVE DEVICES UTILIZED TO PREVENT FALLS:  Life alert? No  Use of a cane, walker or w/c? No  Grab bars in the bathroom? No  Shower chair or bench in shower? No  Elevated toilet seat or a handicapped toilet? No   TIMED UP AND GO:  Was the test performed? Yes .  Length of time to ambulate 10 feet: 10 sec.   Gait steady and fast without use of assistive device  Cognitive Function:    10/14/2018   10:32 AM  MMSE - Mini Mental State Exam  Orientation to time 5  Orientation to Place 5  Registration 3  Attention/ Calculation 5  Recall 3  Language- name 2 objects 2  Language- repeat 1  Language- follow 3 step command 3  Language- read & follow direction 1  Write a sentence 1  Copy design 1  Total score 30      11/27/2021   10:22 AM 02/20/2021   10:40 AM 03/10/2017   11:21 AM 03/10/2016   11:05 AM  Montreal Cognitive Assessment   Visuospatial/ Executive (0/5) '5 4 5 3  '$ Naming (0/3) '3 3 3 3  '$ Attention: Read list of digits (0/2) '2 2 2 2  '$ Attention: Read list of letters (0/1) '1 1 1 1  '$ Attention: Serial 7 subtraction starting at 100 (0/3) '3 3 2 3  '$ Language: Repeat phrase (0/2) 2 0 1 1  Language : Fluency (0/1) '1 1 1 '$ 0  Abstraction (0/2) '2 2 2 2  '$ Delayed Recall (0/5) '4 5 5 3  '$ Orientation (0/6) '6 6 6 6  '$ Total '29 27 28 24  '$ Adjusted Score (based on education)    25      11/16/2022    1:23 PM 11/13/2021   11:27 AM 09/27/2017   10:10 AM  6CIT Screen  What Year? 0 points 0 points 0 points  What month? 0 points 0 points 0 points  What time? 0 points 0 points 0 points  Count back from 20 0 points 0 points 0 points  Months in reverse 0 points 0 points 0  points  Repeat phrase 2 points 0 points 6 points  Total Score 2 points 0 points 6 points    Immunizations Immunization History  Administered Date(s) Administered   PFIZER(Purple Top)SARS-COV-2 Vaccination 01/07/2020, 02/07/2020, 11/06/2020   Pneumococcal Conjugate-13 09/22/2016   Pneumococcal Polysaccharide-23 07/27/2012   Td 11/03/2007   Tdap 11/12/2020   Zoster, Live 10/02/2015    TDAP status: Up to date  Flu Vaccine status: Declined, Education has been provided regarding the importance of this vaccine but patient still declined. Advised may receive this vaccine at local pharmacy or Health Dept. Aware to provide a copy of the vaccination record if obtained from local pharmacy  or Health Dept. Verbalized acceptance and understanding.  Pneumococcal vaccine status: Up to date  Covid-19 vaccine status: Completed vaccines  Qualifies for Shingles Vaccine? Yes   Zostavax completed No   Shingrix Completed?: No.    Education has been provided regarding the importance of this vaccine. Patient has been advised to call insurance company to determine out of pocket expense if they have not yet received this vaccine. Advised may also receive vaccine at local pharmacy or Health Dept. Verbalized acceptance and understanding.  Screening Tests Health Maintenance  Topic Date Due   COVID-19 Vaccine (4 - 2023-24 season) 07/03/2022   Zoster Vaccines- Shingrix (1 of 2) 02/15/2023 (Originally 06/02/1960)   INFLUENZA VACCINE  09/28/2027 (Originally 06/02/2022)   Medicare Annual Wellness (AWV)  11/17/2023   DTaP/Tdap/Td (3 - Td or Tdap) 11/12/2030   Pneumonia Vaccine 45+ Years old  Completed   DEXA SCAN  Completed   HPV VACCINES  Aged Out    Health Maintenance  Health Maintenance Due  Topic Date Due   COVID-19 Vaccine (4 - 2023-24 season) 07/03/2022    Colonoscopy no longer required per pt   Mammogram status: Completed 08/26/22. Repeat every year  Bone Density status: Completed 04/16/22. Results  reflect: Bone density results: OSTEOPOROSIS. Repeat every 2 years.   Additional Screening:   Vision Screening: Recommended annual ophthalmology exams for early detection of glaucoma and other disorders of the eye. Is the patient up to date with their annual eye exam?  Yes  Who is the provider or what is the name of the office in which the patient attends annual eye exams? [facility]  If pt is not established with a provider, would they like to be referred to a provider to establish care? No .   Dental Screening: Recommended annual dental exams for proper oral hygiene  Community Resource Referral / Chronic Care Management: CRR required this visit?  No   CCM required this visit?  No      Plan:     I have personally reviewed and noted the following in the patient's chart:   Medical and social history Use of alcohol, tobacco or illicit drugs  Current medications and supplements including opioid prescriptions. Patient is not currently taking opioid prescriptions. Functional ability and status Nutritional status Physical activity Advanced directives List of other physicians Hospitalizations, surgeries, and ER visits in previous 12 months Vitals Screenings to include cognitive, depression, and falls Referrals and appointments  In addition, I have reviewed and discussed with patient certain preventive protocols, quality metrics, and best practice recommendations. A written personalized care plan for preventive services as well as general preventive health recommendations were provided to patient.     Willette Brace, LPN   1/32/4401   Nurse Notes: none

## 2022-11-23 ENCOUNTER — Encounter: Payer: Medicare HMO | Admitting: Family Medicine

## 2022-11-24 ENCOUNTER — Encounter: Payer: Self-pay | Admitting: Physician Assistant

## 2022-11-24 ENCOUNTER — Ambulatory Visit (INDEPENDENT_AMBULATORY_CARE_PROVIDER_SITE_OTHER): Payer: Medicare HMO | Admitting: Physician Assistant

## 2022-11-24 VITALS — BP 130/70 | HR 64 | Temp 97.5°F | Ht 63.0 in | Wt 178.5 lb

## 2022-11-24 DIAGNOSIS — R739 Hyperglycemia, unspecified: Secondary | ICD-10-CM | POA: Diagnosis not present

## 2022-11-24 DIAGNOSIS — E785 Hyperlipidemia, unspecified: Secondary | ICD-10-CM

## 2022-11-24 DIAGNOSIS — Z0000 Encounter for general adult medical examination without abnormal findings: Secondary | ICD-10-CM | POA: Diagnosis not present

## 2022-11-24 DIAGNOSIS — M810 Age-related osteoporosis without current pathological fracture: Secondary | ICD-10-CM | POA: Diagnosis not present

## 2022-11-24 DIAGNOSIS — E669 Obesity, unspecified: Secondary | ICD-10-CM

## 2022-11-24 DIAGNOSIS — J3489 Other specified disorders of nose and nasal sinuses: Secondary | ICD-10-CM | POA: Diagnosis not present

## 2022-11-24 DIAGNOSIS — F419 Anxiety disorder, unspecified: Secondary | ICD-10-CM | POA: Diagnosis not present

## 2022-11-24 DIAGNOSIS — N3281 Overactive bladder: Secondary | ICD-10-CM

## 2022-11-24 LAB — LIPID PANEL
Cholesterol: 192 mg/dL (ref 0–200)
HDL: 52.5 mg/dL (ref >39.00)
LDL Cholesterol: 106 mg/dL — ABNORMAL HIGH (ref 0–99)
NonHDL: 139.94
Total CHOL/HDL Ratio: 4
Triglycerides: 169 mg/dL — ABNORMAL HIGH (ref 0.0–149.0)
VLDL: 33.8 mg/dL (ref 0.0–40.0)

## 2022-11-24 LAB — CBC WITH DIFFERENTIAL/PLATELET
Basophils Absolute: 0.1 10*3/uL (ref 0.0–0.1)
Basophils Relative: 0.9 % (ref 0.0–3.0)
Eosinophils Absolute: 0.1 10*3/uL (ref 0.0–0.7)
Eosinophils Relative: 1.6 % (ref 0.0–5.0)
HCT: 42 % (ref 36.0–46.0)
Hemoglobin: 14.4 g/dL (ref 12.0–15.0)
Lymphocytes Relative: 36.1 % (ref 12.0–46.0)
Lymphs Abs: 2.2 10*3/uL (ref 0.7–4.0)
MCHC: 34.4 g/dL (ref 30.0–36.0)
MCV: 93.2 fl (ref 78.0–100.0)
Monocytes Absolute: 0.4 10*3/uL (ref 0.1–1.0)
Monocytes Relative: 7.1 % (ref 3.0–12.0)
Neutro Abs: 3.3 10*3/uL (ref 1.4–7.7)
Neutrophils Relative %: 54.3 % (ref 43.0–77.0)
Platelets: 228 10*3/uL (ref 150.0–400.0)
RBC: 4.5 Mil/uL (ref 3.87–5.11)
RDW: 13.3 % (ref 11.5–15.5)
WBC: 6 10*3/uL (ref 4.0–10.5)

## 2022-11-24 LAB — COMPREHENSIVE METABOLIC PANEL
ALT: 21 U/L (ref 0–35)
AST: 22 U/L (ref 0–37)
Albumin: 4.4 g/dL (ref 3.5–5.2)
Alkaline Phosphatase: 56 U/L (ref 39–117)
BUN: 14 mg/dL (ref 6–23)
CO2: 28 mEq/L (ref 19–32)
Calcium: 9.5 mg/dL (ref 8.4–10.5)
Chloride: 102 mEq/L (ref 96–112)
Creatinine, Ser: 0.9 mg/dL (ref 0.40–1.20)
GFR: 60.39 mL/min (ref >60.00)
Glucose, Bld: 99 mg/dL (ref 70–99)
Potassium: 4.5 mEq/L (ref 3.5–5.1)
Sodium: 139 mEq/L (ref 135–145)
Total Bilirubin: 0.5 mg/dL (ref 0.2–1.2)
Total Protein: 7 g/dL (ref 6.0–8.3)

## 2022-11-24 LAB — URINALYSIS
Bilirubin Urine: NEGATIVE
Hgb urine dipstick: NEGATIVE
Ketones, ur: NEGATIVE
Leukocytes,Ua: NEGATIVE
Nitrite: NEGATIVE
Specific Gravity, Urine: <=1.005 — AB (ref 1.000–1.030)
Total Protein, Urine: NEGATIVE
Urine Glucose: NEGATIVE
Urobilinogen, UA: 0.2 (ref 0.0–1.0)
pH: 6.5 (ref 5.0–8.0)

## 2022-11-24 LAB — HEMOGLOBIN A1C: Hgb A1c MFr Bld: 6 % (ref 4.6–6.5)

## 2022-11-24 MED ORDER — FLUTICASONE PROPIONATE 50 MCG/ACT NA SUSP: 1.0000 | Freq: Every day | NASAL | 0 refills | Status: DC

## 2022-11-24 MED ORDER — DIAZEPAM 5 MG PO TABS: 5.0000 mg | ORAL_TABLET | Freq: Four times a day (QID) | ORAL | 0 refills | Status: DC | PRN

## 2022-11-24 NOTE — Patient Instructions (Addendum)
It was great to see you!  Please start vitamin D, regular weight bearing exercise such as walking and ensure adequate calcium intake.   If you do not already take caltrate '600mg'$  + D twice daily- please add.  This is available over the counter  Keep up the good work on alcohol reduction -- this will greatly help your overall health.  Please go to the lab for blood work.   Our office will call you with your results unless you have chosen to receive results via MyChart.  If your blood work is normal we will follow-up each year for physicals and as scheduled for chronic medical problems.  If anything is abnormal we will treat accordingly and get you in for a follow-up.  Take care,  Deloris Moger  .os

## 2022-11-24 NOTE — Progress Notes (Signed)
Subjective:    Nicole Holt is a 82 y.o. female and is here for a comprehensive physical exam.  HPI  There are no preventive care reminders to display for this patient.  Acute Concerns: None  Chronic Issues: GAD Takes valium 2.5 mg (1/2 tablet) most days Tolerates well Denies SI/HI  Osteoporosis She is not ready to take any medication Last DEXA was 04/16/22 She is not taking calcium and vitamin D  Insulin Resistance She is working on ETOH reduction - has consumed alcohol only twice this month so far. Lab Results  Component Value Date   HGBA1C 5.9 11/14/2021   HLD Was started on statin in the past but does not want to take this  OAB Saw urology and recommended pelvic floor PT She does not want to pursue this currently She likes to have her urine checked annually  Health Maintenance: Immunizations -- UTD Colonoscopy -- 06/30/22 --> she is done with these Mammogram -- 08/28/22 PAP -- n/a Bone Density -- 04/16/22 Diet -- working on [employer] Exercise -- going to the gym every other day  Sleep habits -- improved with alcohol reduction Mood -- overall stable  UTD with dentist? - yes UTD with eye doctor? - yes  Weight history: Wt Readings from Last 10 Encounters:  11/24/22 178 lb 8 oz (81 kg)  11/16/22 178 lb 3.2 oz (80.8 kg)  05/06/22 176 lb 3.2 oz (79.9 kg)  04/16/22 176 lb 6.4 oz (80 kg)  11/27/21 176 lb (79.8 kg)  11/14/21 176 lb 3.2 oz (79.9 kg)  11/13/21 176 lb (79.8 kg)  06/20/21 172 lb 12.8 oz (78.4 kg)  02/20/21 176 lb (79.8 kg)  11/12/20 176 lb (79.8 kg)   Body mass index is 31.62 kg/m. No LMP recorded. Patient is postmenopausal.  Alcohol use:  reports current alcohol use of about 12.0 standard drinks of alcohol per week.  Tobacco use:  Tobacco Use: Medium Risk (11/24/2022)   Patient History    Smoking Tobacco Use: Former    Smokeless Tobacco Use: Never    Passive Exposure: Not on file   Eligible for lung cancer screening? no      11/24/2022    1:13 PM  Depression screen PHQ 2/9  Decreased Interest 0  Down, Depressed, Hopeless 0  PHQ - 2 Score 0     Other providers/specialists: Patient Care Team: Inda Coke, [place] as PCP - General (Physician Assistant) Dennie Bible, NP as Nurse Practitioner (Neurology) Marylynn Pearson, MD as Consulting Physician (Ophthalmology) Dohmeier, Asencion Partridge, MD as Consulting Physician (Neurology) Dentistry, [facility] Madelin Rear, [facility] (Inactive) as Pharmacist (Pharmacist)    PMHx, SurgHx, SocialHx, Medications, and Allergies were reviewed in the Visit Navigator and updated as appropriate.   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    Osteoporosis    Sleep apnea    Tinnitus      Past Surgical History:  Procedure Laterality Date   AIR/FLUID EXCHANGE Right 09/12/2020  Procedure: AIR/FLUID EXCHANGE;  Surgeon: Bernarda Caffey, MD;  Location: [place];  Service: Ophthalmology;  Laterality: Right;   CATARACT EXTRACTION Bilateral OD: 10.25.21 OS: 12.2015   Dr. Venetia Maxon   cataract surgery     COSMETIC SURGERY     EYE SURGERY Bilateral    Cat Sx - Dr. Venetia Maxon   EYE SURGERY Right 09/12/2020   PPV/EL for retained lens material - Dr. Bernarda Caffey   PARS PLANA VITRECTOMY Right 09/12/2020   Procedure: PARS PLANA VITRECTOMY WITH 23 GAUGE;  Surgeon: Bernarda Caffey, MD;  Location: [place];  Service: Ophthalmology;  Laterality: Right;   PHOTOCOAGULATION WITH LASER Right 09/12/2020   Procedure: PHOTOCOAGULATION WITH LASER;  Surgeon: Bernarda Caffey, MD;  Location: [place];  Service: Ophthalmology;   Laterality: Right;   REMOVAL RETAINED LENS Right 09/12/2020   Procedure: REMOVAL RETAINED LENS;  Surgeon: Bernarda Caffey, MD;  Location: [place];  Service: Ophthalmology;  Laterality: Right;     Family History  Problem Relation Age of Onset   Osteoporosis Mother    Stroke Mother    Hyperlipidemia Mother    Hypertension Mother    Kidney disease Father    Congestive Heart Failure Father    Heart disease Father    Hyperlipidemia Father    Stroke Sister    Parkinson's disease Sister    Heart disease Sister    Heart disease Maternal Grandmother    Gout Maternal Grandmother    Stroke Maternal Grandfather    Stroke Paternal Grandmother    Thrombosis Paternal Grandfather    Early death Paternal Grandfather    Hypertension Brother    Diabetes Brother    Arthritis/Rheumatoid Other    Parkinson's disease Other    Heart disease Other    Obesity Daughter    Breast cancer Neg Hx     Social History   Tobacco Use   Smoking status: Former   Smokeless tobacco: Never   Tobacco comments:    Quit in 1987  Vaping Use   Vaping Use: Never used  Substance Use Topics   Alcohol use: Yes    Alcohol/week: 12.0 standard drinks of alcohol    Types: 12 Glasses of wine per week    Comment: wine or alcohol daily   Drug use: No    Review of Systems:   Review of Systems  Constitutional:  Negative for chills, fever, malaise/fatigue and weight loss.  HENT:  Negative for hearing loss, sinus pain and sore throat.   Respiratory:  Negative for cough and hemoptysis.   Cardiovascular:  Negative for chest pain, palpitations, leg swelling and PND.  Gastrointestinal:  Negative for abdominal pain, constipation, diarrhea, heartburn, nausea and vomiting.  Genitourinary:  Negative for dysuria, frequency and urgency.  Musculoskeletal:  Negative for back pain, myalgias and neck pain.  Skin:  Negative for itching and rash.  Neurological:  Negative for dizziness, tingling, seizures and headaches.   Endo/Heme/Allergies:  Negative for polydipsia.  Psychiatric/Behavioral:  Negative for depression. The patient is not nervous/anxious.     Objective:   BP 130/70 (BP Location: Left Arm, Patient Position: Sitting, Cuff Size: Normal)   Pulse 64   Temp (!) 97.5 F (36.4 C) (Temporal)   Ht '5\' 3"'$  (1.6 m)   Wt 178 lb 8 oz (81 kg)   SpO2 95%   BMI 31.62 kg/m  Body mass index is 31.62 kg/m.   General Appearance:    Alert, cooperative, no distress, appears stated age  Head:    Normocephalic, without  obvious abnormality, atraumatic  Eyes:    PERRL, conjunctiva/corneas clear, EOM's intact, fundi    benign, both eyes  Ears:    Normal TM's and external ear canals, both ears  Nose:   Nares normal, septum midline, mucosa normal, no drainage    or sinus tenderness  Throat:   Lips, mucosa, and tongue normal; teeth and gums normal  Neck:   Supple, symmetrical, trachea midline, no adenopathy;    thyroid:  no enlargement/tenderness/nodules; no carotid   bruit or JVD  Back:     Symmetric, no curvature, ROM normal, no CVA tenderness  Lungs:     Clear to auscultation bilaterally, respirations unlabored  Chest Wall:    No tenderness or deformity   Heart:    Regular rate and rhythm, S1 and S2 normal, no murmur, rub or gallop  Breast Exam:    Deferred  Abdomen:     Soft, non-tender, bowel sounds active all four quadrants,    no masses, no organomegaly  Genitalia:    Deferred  Extremities:   Extremities normal, atraumatic, no cyanosis or edema  Pulses:   2+ and symmetric all extremities  Skin:   Skin color, texture, turgor normal, no rashes or lesions  Lymph nodes:   Cervical, supraclavicular, and axillary nodes normal  Neurologic:   CNII-XII intact, normal strength, sensation and reflexes    throughout    Assessment/Plan:   Routine physical examination Today patient counseled on age appropriate routine health concerns for screening and prevention, each reviewed and up to date or declined.  Immunizations reviewed and up to date or declined. Labs ordered and reviewed. Risk factors for depression reviewed and negative. Hearing function and visual acuity are intact. ADLs screened and addressed as needed. Functional ability and level of safety reviewed and appropriate. Education, counseling and referrals performed based on assessed risks today. Patient provided with a copy of personalized plan for preventive services.  Age-related osteoporosis without current pathological fracture Reviewed DEXA She does not want to take any rx at this time Recommend calcium, vit D and weight bearing exercises  Anxiety Well controlled per patient Denies SI/HI Continue 2.5 mg valium prn Follow-up in 6 months, sooner if concerns If any falls, she is to let us know right away  Dyslipidemia Update lipid panel and make recommendations accordingly She is statin-hesitant  Sinus drainage Refill flonase  Overactive bladder Declines any significant needs Continue home kegels and follow-up with urology Update UA per patient request -- if any abnormalities, refer back to uro  Hyperglycemia Update A1c and make recommendations accordingly  Obesity Continue to work on healthy diet and exercise as able  Inda Coke, PA-C [place]

## 2022-11-25 LAB — URINE CULTURE
MICRO NUMBER:: 14461125
Result:: NO GROWTH
SPECIMEN QUALITY:: ADEQUATE

## 2022-11-30 ENCOUNTER — Encounter: Payer: Self-pay | Admitting: Neurology

## 2022-11-30 ENCOUNTER — Ambulatory Visit: Payer: Medicare HMO | Admitting: Neurology

## 2022-11-30 VITALS — BP 128/72 | HR 73 | Ht 63.0 in | Wt 177.0 lb

## 2022-11-30 DIAGNOSIS — G4733 Obstructive sleep apnea (adult) (pediatric): Secondary | ICD-10-CM

## 2022-11-30 DIAGNOSIS — G250 Essential tremor: Secondary | ICD-10-CM | POA: Diagnosis not present

## 2022-11-30 DIAGNOSIS — G478 Other sleep disorders: Secondary | ICD-10-CM | POA: Diagnosis not present

## 2022-11-30 DIAGNOSIS — F8081 Childhood onset fluency disorder: Secondary | ICD-10-CM | POA: Diagnosis not present

## 2022-11-30 NOTE — Progress Notes (Signed)
[facility]  Provider:  Larey Seat, M D  Referring Provider: Vivi Barrack, MD Primary Care Physician:  Inda Coke, [place]   Chief Complaint  Patient presents with   Follow-up    Pt alone, rm 10. Overall doing ok with new CPAP . She has had a rough year but seems to be on the up of everything. DME [employer], machine is new - [employer]ics.      HPI:   Nicole Holt is a 82 y.o. female seen here as a an established patient with OSA on a Respironics CPAP on  Seen here on 11-30-2021. DME is [employer] and her RESPIRONICS machine is made by a company no longer in business. She is worried about her supplies.  Nasal pillow with magnetic clips. UARS more than OSA. The patient has been very judicious in ordering supplies but I will ask her not to get supplies on a regular basis even if she does not need them immediately as we do not know if the supply will last.  She remains a highly compliant CPAP user 100% of days with CPAP usage and all of these days over 4 hours 100%.  The average usage by days is 9 hours 12 minutes and mean pressure is 5.9 cm water 90% of the time she is at 8 cm water and her average time in air leak is only 2 minutes with a residual AHI of 0.8/h.  These are very good numbers.   Her minimum CPAP pressure is set at 5 cm and her maximum pressure at 8 cm water with 2 cm A- flex setting. Insomnia with mind racing is still present.  Last year in 02-27-2023 her sister died.    12-01-2021, She got a replacement from [employer]. New machine 10-2020. Mild sleep apnea underlying, more UARS.  The patient has been a highly compliant CPAP user, she does have an older CPAP machine that she keeps at her house for the weekend and today she has usually on weekends use of that machine at [facility].  Her compliance for the home machine however is still excellent it is set with between 5 and 8 cm water pressure with 2 cm EPR, humidifier is set at  level 4 ramp start pressure at 4 cmH2O, ramp type over 30 minutes.  Compliance has been 93% and the patient used the machine on average 9 hours and 3 minutes.  The 95th percentile pressure is 6.4 cmH2O and she does have minimal air leakage with a residual AHI of only 0.7/h.  So these are very good numbers she also is not excessively daytime sleepy and endorsed today the Epworth sleepiness score at 8 points.  The patient also has voiced memory concerns in the past and for this reason we have followed with serial tests she scored today on the [facility] Cognitive Assessment 29 out of 30 points. She was most concerned about memory lapses when she was depressed and anxious, and grieving. She had also a lot trauma.          02-20-2021.[name] previous CPAP machine was over 15 years old and had to be replaced she received a new [place] for the recalled and older one- this one is a  Radiation protection practitioner.  Her compliance information states that she has used the device 30 out of 30 days with 8 hours and 90 minutes on average.    She does have an average AHI of 1.0/h  only this is an excellent resolution.  She is not in high air leakage.  She is using a dream wear nasal cradle mask.  She has 90 percentile pressure of 8 cmH2O but the window of pressure for her machine is between 5 and 8 cmH2O so ligamenta flava was originally set at 2 but we discussed today that she should increase the humidifier settings in order to combat dry throat.  She has a ramp time of 30 minutes and seems to be doing well with that.   So, my goal today is to actually increase the maximum pressure settings to 10 cm water.  Her sister has PD and dementia, cared for by her husband.    Last year, Jan 15, 2020- she had 7 deaths in her circle of friends. One family member's body was found in the heat of July outside in her garden after several days.  She has still flash backs, smelled and even tasted the odor when she recalls the image.   Her sister diagnosed with PD in 2012/01/15. She also then had flash backs of her husband's image in 15-Jan-1975.  He died after a severe trauma, he had shot himself in her presence to her head. She stayed a widow for 10 years.  She admitted to starting temporarily drinking heavily, and has stopped by now. She has visibly changed, trembling, trembling voice, and facial flushing.  Speech therapy has been completed, her wordfinding has not progressed. Her hearing aids have not optimized her ability to listen, and she may have more recall difficulties.     RV 10-30-2019.  She is hard of hearing and would rather me using a face-shield. She reports a lot of broken veins in her legs. I encouraged her to see PCP for that. Her speech has been less fluent , more stammering.  She has been listening to great courses on CD ROM, established a day by day routine.  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  She became tearful when she spoke about lonely Christmas. Her sister has PD, and is ' pitiful" in declining health. Her grandson, whom she adores is now 70 years old and came over on Christmas Day. She failed on social distancing by her own accord. At the [name] is born on [birth year], she is using a C-Flex Respironics machine her download for the last 30 days shows 100% compliance no day without use of an average use at time of 9 hours 29 minutes and 59 seconds.  Average time in airleak is 2 minutes at night her average AHI is 0.7 and her CPAP is set at the lowest possible pressure of 5 cm water.  The geriatric depression score was endorsed at 8 out of 15 points indicative of clinical depression, her Epworth Sleepiness Scale was endorsed at 6 out of 24 points and she does not feel necessarily fatigue but she acknowledges that she has a hard time getting out of bed in the morning. The CPAP machine was issued 09-27-2012.   CD -Revisit on 03-14-2018 She was originally sent by PA  Jerline Pain for  OSA work up, now followed for CPAP compliance.  I had not seen Mrs. Goswick since 2016-01-15 and in the meantime she has followed with our nurse practitioner here for regular compliance checks.  Today she endorsed the fatigue severity score of 12 points, the Epworth sleepiness score is 3 points, the geriatric depression score at 0 points.  Her C-Flex machine has been followed  by Hue[facility] Romans And she received her machine  5.5 years ago and could get a new one. Has one nocturia at 3 AM. Sleeps 6-7 hours at night. No dry mouth nor headaches. No choking or snoring.   This CPAP still works, she has been highly compliant, 100% with a average use of time on all days of 8 hours and 50 minutes nightly, she has a residual AHI of only 0.6 with a CPAP setting of only 5 cm water.  This patient had a very mild apnea and mainly upper airway resistance syndrome which is treated with low pressure CPAP. She never required oxygen, and she has been feeling less sleepy and less fatigued, clearly stating a benefit of CPAP therapy. She has still sinus post nasal drip.     Mrs. Lauderbaugh originally presented upon referral of Dr. Laney Pastor in 2013 . She is a right handed , chronically hoarse ,caucasian female patient , who  had reportedly loudly snoring and often woke herself from her snoring. She also woke up with a dry mouth and felt not  rested or restored in the mornings.  She gained weight , became less active.  Prior to her sleep evaluation she endorsed getting 6-7 hours of nocturnal sleep.  She also has suffered from allergic rhinitis and used an Teaching laboratory technician. Her grandchildren had reportedly been bothered by her loud snoring , which made her seek medical advice. She endorsed the Epworth score at 14 of 21 points at the time ( 2013) . The patient's sleep study in November 2013 documented AHI of 9.2 and RDI of 18, not SPLIT -  but she was hypoxic for over one hour at night.  She clearly had upper airway resistance secondary to an elongated  uvula and her AH I was accentuated during supine sleep and REM sleep.  She returned for a CPAP auto- titration and seemed to respond best to only 5 cm water pressure. 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 As she started on CPAP later that year , her rhinitis improved and she had no further sinusitis exacerbations.  She goes to bed at 11 Pm and falls asleep within 5 minutes, has a bathroom break at 2.30 AM and rarely another at 4.30. Unless she has a lot on her mind, she will easily fall asleep again.  Total sleep time is now 7-8 hours nightly, no daytime naps.  Her Epworth sleepiness score had been reduced after she initiated CPAP he was and is currently at 6 points her fatigue severity score it on the 18 points , both greatly reduced. . Her geriatric depression score was at 3 points,  not indicative of depression.  Unable today to review the patient's compliance record she has 100% compliance on 180 date download, dated 02-02-2014. The nightly use of 7 hours 55 minutes with CPAP her residual AHI is 0.9 at that but CPAP pressure of 5 cm water. She loves her CPAP ! No neck , face or ENT surgery history .   Interval history since the patient underwent a first  CPAP titration on 09-27-12 as been followed yearly in our office.  Today is 03-12-15, the patient is currently under [facility] coverage and uses the only available DME, preop. She has a CPAP machine by [employer] system 1 with heated humidifier,  coiled air tubing and uses  a S-M size nasal mask. She has still other size liners, but only this one fits.  She needs to reorder this size through upper area. Her compliance information is available today downloaded in office on with a date 03-11-15 she's 100% compliant forward 30 out of 30 days of use 100% for over 4 hours of daily use with an  average user time of 8 hours and 17 minutes. Her average AHI is 0.8 and a CPAP pressure of only 5 cm water. Her speech is non fluent at the beginning of today's visit, but improved. She looks for words.   Review of Systems: Out of a complete 14 system review, the patient complains of only the following symptoms, and all other reviewed systems are negative.  Her speech is non fluent at the beginning of today's visit, but improved.  She looks for words, reports  the very recent fatal car accident of a close friend to her grandsons. .   She writes fluently.   UARS - very mild OSA- can't sleep without CPAP.  8 cm water worked well for her.nasal cradle or pillow is her preferred interface.    Her 68 year old sister suffers from Parkinson's disease, falls all the time.  How likely are you to doze in the following situations: 0 = not likely, 1 = slight chance, 2 = moderate chance, 3 = high chance  Sitting and Reading? Watching Television? Sitting inactive in a public place (theater or meeting)? Lying down in the afternoon when circumstances permit? Sitting and talking to someone? Sitting quietly after lunch without alcohol? In a car, while stopped for a few minutes in traffic? As a passenger in a car for an hour without a break?  Total = 8/ 24   FSS: 21/ 63 points.    Today she endorsed the Epworth sleepiness score is 1 point,  the geriatric depression score at 0 points.   Her Phillips CPAP machine has been followed by [facility]uey Romans.       Social History   Socioeconomic History   Marital status: Married    Spouse name: Legrand Como   Number of children: 2   Years of education: 16   Highest education level: Some college, no degree  Occupational History   Occupation: Retired  Tobacco Use   Smoking status: Former   Smokeless tobacco: Never   Tobacco comments:    Quit in [place] Use   Vaping Use: Never used  Substance and Sexual Activity   Alcohol use: Yes    Alcohol/week: 12.0  standard drinks of alcohol    Types: 12 Glasses of wine per week    Comment: wine or alcohol daily   Drug use: No   Sexual activity: Yes    Birth control/protection: Post-menopausal  Other Topics Concern   Not on file  Social History Narrative   Patient is married Legrand Como) and lives with her husband.   Patient is retired.   Patient has two children.   Patient is right-handed.   Patient has a college education.   Patient drinks two cups of coffee daily.            Social Determinants of Health   Financial Resource Strain: Low Risk  (11/16/2022)   Overall Financial Resource Strain (CARDIA)    Difficulty of Paying Living Expenses: Not hard at all  Food Insecurity: No Food Insecurity (11/16/2022)   Hunger Vital Sign    Worried About Running Out of Food in the Last Year: Never true  Ran Out of Food in the Last Year: Never true  Transportation Needs: No Transportation Needs (11/16/2022)   PRAPARE - Hydrologist (Medical): No    Lack of Transportation (Non-Medical): No  Physical Activity: Insufficiently Active (11/16/2022)   Exercise Vital Sign    Days of Exercise per Week: 3 days    Minutes of Exercise per Session: 30 min  Stress: Stress Concern Present (11/16/2022)   [place]    Feeling of Stress : To some extent  Social Connections: Moderately Isolated (11/16/2022)   Social Connection and Isolation Panel [NHANES]    Frequency of Communication with Friends and Family: More than three times a week    Frequency of Social Gatherings with Friends and Family: More than three times a week    Attends Religious Services: Never    Marine scientist or Organizations: No    Attends Archivist Meetings: Never    Marital Status: Married  Human resources officer Violence: Not At Risk (11/16/2022)   Humiliation, Afraid, Rape, and Kick questionnaire    Fear of Current or Ex-Partner: No     Emotionally Abused: No    Physically Abused: No    Sexually Abused: No    Family History  Problem Relation Age of Onset   Osteoporosis Mother    Stroke Mother    Hyperlipidemia Mother    Hypertension Mother    Kidney disease Father    Congestive Heart Failure Father    Heart disease Father    Hyperlipidemia Father    Stroke Sister    Parkinson's disease Sister    Heart disease Sister    Heart disease Maternal Grandmother    Gout Maternal Grandmother    Stroke Maternal Grandfather    Stroke Paternal Grandmother    Thrombosis Paternal Grandfather    Early death Paternal Grandfather    Hypertension Brother    Diabetes Brother    Arthritis/Rheumatoid Other    Parkinson's disease Other    Heart disease Other    Obesity Daughter    Breast cancer Neg Hx     Past Medical History:  Diagnosis Date   Allergy    Anxiety    panic attack   Depression    GERD (gastroesophageal reflux disease)    Glaucoma    Glaucoma    Hearing loss    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    Osteoporosis    Sleep apnea    Tinnitus     Past Surgical History:  Procedure Laterality Date   AIR/FLUID EXCHANGE Right 09/12/2020   Procedure: AIR/FLUID EXCHANGE;  Surgeon: Bernarda Caffey, MD;  Location: [place];  Service: Ophthalmology;  Laterality: Right;   CATARACT EXTRACTION Bilateral OD: 10.25.21 OS: 12.2015   Dr. Venetia Maxon   cataract surgery     COSMETIC SURGERY     EYE SURGERY Bilateral    Cat Sx - Dr. Venetia Maxon   EYE SURGERY Right 09/12/2020   PPV/EL for retained lens material - Dr. Bernarda Caffey   PARS PLANA VITRECTOMY Right 09/12/2020   Procedure: PARS PLANA VITRECTOMY WITH 23 GAUGE;  Surgeon: Bernarda Caffey, MD;  Location: [place];  Service:  Ophthalmology;  Laterality: Right;    PHOTOCOAGULATION WITH LASER Right 09/12/2020   Procedure: PHOTOCOAGULATION WITH LASER;  Surgeon: Bernarda Caffey, MD;  Location: [place];  Service: Ophthalmology;  Laterality: Right;   REMOVAL RETAINED LENS Right 09/12/2020   Procedure: REMOVAL RETAINED LENS;  Surgeon: Bernarda Caffey, MD;  Location: [place];  Service: Ophthalmology;  Laterality: Right;    Current Outpatient Medications  Medication Sig Dispense Refill   diazepam (VALIUM) 5 MG tablet Take 1 tablet (5 mg total) by mouth every 6 (six) hours as needed. for anxiety 30 tablet 0   fluticasone (FLONASE) 50 MCG/ACT nasal spray Place 1 spray into both nostrils daily. 16 g 0   latanoprost (XALATAN) 0.005 % ophthalmic solution      melatonin 3 MG TABS tablet Take 3 mg by mouth at bedtime as needed (sleep).     polyethylene glycol (MIRALAX / GLYCOLAX) 17 g packet Take 17 g by mouth daily as needed.     Turmeric (QC TUMERIC COMPLEX PO) Take 1 capsule by mouth daily.      No current facility-administered medications for this visit.    Allergies as of 11/30/2022 - Review Complete 11/30/2022  Allergen Reaction Noted   Codeine Nausea Only 01/18/2012   Estrogens conjugated Other (See Comments) 01/18/2012   Paroxetine hcl Other (See Comments) 01/18/2012    Vitals: BP 128/72   Pulse 73   Ht '5\' 3"'$  (1.6 m)   Wt 177 lb (80.3 kg)   BMI 31.35 kg/m  Last Weight:  Wt Readings from Last 1 Encounters:  11/30/22 177 lb (80.3 kg)   Last Height:   Ht Readings from Last 1 Encounters:  11/30/22 '5\' 3"'$  (1.6 m)    Physical exam:  General: The patient is awake, alert and appears not in acute distress.  The patient is well groomed. Has dysphonia.   Montreal Cognitive Assessment Blind 11/27/2021 02/20/2021 03/10/2017 03/10/2016  Attention: Read list of digits (0/2) '2 2 2 2  '$ Attention: Read list of letters (0/1) '1 1 1 1  '$ Attention: Serial 7 subtraction starting at 100 (0/3) '3 3 2 3  '$ Language: Repeat phrase (0/2) 2 0 1 1  Language : Fluency (0/1) '1 1 1 '$ 0   Abstraction (0/2) '2 2 2 2  '$ Delayed Recall (0/5) '4 5 5 3  '$ Orientation (0/6) '6 6 6 6  '$ Total 29 29 - -    Head: Normocephalic, atraumatic.  Neck is supple. Mallampati 2 , neck circumference:15.0, has a mild tongue tremor,  jaw tremor, tremulous speech, worse when frustrated or nervous.   Cardiovascular:  Regular rate and rhythm, without murmurs or carotid bruit, and without distended neck veins. Respiratory: Lungs are clear to auscultation.Skin: tanned.  Without evidence of edema, or rash. No ankle edema. Facial rosacea.  Trunk: BMI is elevated 31.1 kg/m2.  This patient has normal posture.  Neurologic exam : The patient is awake and alert, oriented to place and time.  There is a normal attention span & concentration ability.  Speech is non- fluent, she struggles today with fluency,  is mid-sentence lost for words.  Mood and affect are extroverted, cooperative.   She feels easily in tears. She is easily choked up.  Cranial nerves: Pupils are reactive to light. The patient just underwent a left-sided lensectomy.  She does have a slightly disrounded left pupil - wears no longer corrective lenses which allows her to not wear glasses.  Funduscopic exam without evidence of pallor or edema.  Extraocular movements  in  vertical and horizontal planes intact and without nystagmus.  Visual fields by finger perimetry are intact.  Hearing corrected by hearing aids bilaterally- she speaks loudly.  Facial motor strength is symmetric. Motor exam:  Normal tone, muscle bulk and symmetric normal strength in all extremities.  Coordination:  Finger-to-nose maneuver tested and this is performed with a mild tremor.  Gait and station: Patient walks without assistive device. Strength within normal limits.    Assessment:  After physical and neurologic examination, review of laboratory studies, imaging, neurophysiology testing and pre-existing records, assessment is   0) Tremor , essential, titubation and  vocal, not affecting hand writing. unchanged   1) Mild OSA with UARS.  CPAP was recalled and renewed, delivered on 10-2020-  at 5-8 cm water, suprisingly 95% pressure is now at the current 8 cm water pressure. [employer] was DME and maker is [employer].     100% compliance, well controlled AHI under 1 !  She alternates between 2 machines. .  Humidifier can be self adjusted.  We have no new study since 2013- the macine came form [employer] directly through their order.   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    Next visit again with [facility] please,  in 25 month  !  She refused MOCA today , 11-30-2022.  She will always bring her machine.    DME is [employer]. Insurance is [employer] fast streamer. I reordered supplies.    Larey Seat, MD   10-30-2019 Medical Director of [facility] at [facility],  Fellow of the AASM, ABPN and ABSM.

## 2022-11-30 NOTE — Patient Instructions (Signed)
Safe Surgery and Sleep Apnea Sleep apnea is a condition in which breathing pauses or becomes shallow during sleep. Most people with the condition are not aware that they have it. Your health care providers need to know whether or not you have sleep apnea, especially if you are having surgery. Sleep apnea can increase your risk of complications during and after surgery. Tell a health care provider about: Any medical conditions you have, especially if you have sleep apnea. Any allergies you have. All medicines you are taking, including vitamins, herbs, eye drops, creams, and over-the-counter medicines. Any blood disorders you have. Any problems you or family members have had with anesthetic medicines. Any surgeries you have had. Whether you are pregnant or may be pregnant. What are the risks of having surgery? Untreated sleep apnea increases the risk for certain complications during and after surgery. This is because when you have sleep apnea, your airways are more sensitive to medicines used during surgery. The airways can collapse and block the flow of air.  Having untreated sleep apnea can increase your risk for: A longer stay in the recovery room or hospital. Breathing difficulties such as low oxygen levels after surgery. Increased pain after surgery. Irregular heart rhythms. Stroke. Heart attack. You and your health care provider can take steps to help prevent these and other complications. What happens before the surgery? Sleep apnea screening Sleep apnea screening is a series of questions that determine if you are at risk for sleep apnea. Before you have surgery, get screened for sleep apnea and talk with your surgeon and primary health care provider about your results. Screening usually involves answering questions about your sleep quality. Ask your health care provider if you can be screened, or take a screening test yourself. You can find these tests online at the American Sleep Apnea  Association website. Some questions you may be asked include: Do you snore? Is your sleep restless? Do you have daytime sleepiness? Has a partner or spouse told you that you stop breathing during sleep? Have you had trouble concentrating or memory loss? Answer these questions honestly. If a screening test is positive, this means you are at risk for the condition. Further testing may be needed to confirm a diagnosis of sleep apnea. General instructions Talk to your health care provider about your individual risks based on your screening results and the type of surgery you will be having. If you have a sleep apnea device (positive airway pressure device), wear it as prescribed. If you have not been wearing your device, talk with your health care provider about why you have not been wearing it. There are ways to improve your use of the device, such as: Adjusting the mask. Adding humidified air. Getting treatment for nasal congestion. Do not use any products that contain nicotine or tobacco. These products include cigarettes, chewing tobacco, and vaping devices, such as e-cigarettes. If you need help quitting, ask your health care provider. Do not drink alcohol the day before or after your surgery because it can worsen your sleep apnea. What happens on the day of surgery? If told by your health care provider, bring your sleep apnea device with you. Wear your sleep apnea device when you are sleeping during your hospital stay, or as told by your health care provider. Ask your health care provider what special considerations will be taken during and after your surgery. What can I expect after the surgery? You may need to be given extra oxygen and wear a continuous  oxygen monitor (pulse oximetry). For your safety, you may need to stay in the recovery room or hospital for longer than usual. Follow these instructions at home: Medicines Avoid using sleep medicines unless they are prescribed by a health  care provider who is aware of the results of your sleep apnea screening. Avoid using sleep medicines while taking opioid pain medicine. Limit your use of opioid pain medicines as much as possible. Ask your health care provider what is a safe amount to use. Ask about using pain medicines that do not affect your breathing, such as NSAIDs or acetaminophen. General instructions  If your health care provider approves, raise the head of your bed or lie on your side. Do not lie flat on your back. Follow instructions from your health care provider about wearing your sleep device: Anytime you are sleeping, including during daytime naps. While taking prescription pain medicines, sleeping medicines, or medicines that make you drowsy. If you will be going home right after the procedure, plan to have a responsible adult care for you for the time you are told. This is important. Where to find more information For more information about sleep apnea screening and healthy sleep, visit these websites: Centers for Disease Control and Prevention: http://www.wolf.info/ American Sleep Apnea Association: www.sleepapnea.org Contact a health care provider if: You have sleep apnea or think you may be at risk for sleep apnea, and you are scheduled for surgery. Get help right away if: You have trouble breathing. You are very drowsy and cannot stay awake. You are told that you have pauses in your breathing during sleep after surgery. You have chest pain. You have a fast heartbeat. These symptoms may represent a serious problem that is an emergency. Do not wait to see if the symptoms will go away. Get medical help right away. Call your local emergency services (911 in the [place] Do not drive yourself to the hospital. Summary Your health care providers need to know whether or not you have sleep apnea, especially if you are having surgery. If you have sleep apnea, you are at an increased risk for complications during surgery. You  and your health care provider can take precautions to help prevent complications. If you have sleep apnea, make sure to tell your health care provider and anesthesia specialist. This information is not intended to replace advice given to you by your health care provider. Make sure you discuss any questions you have with your health care provider. Document Revised: 09/27/2020 Document Reviewed: 09/27/2020 Elsevier Patient Education  [place].

## 2022-12-08 DIAGNOSIS — Z85828 Personal history of other malignant neoplasm of skin: Secondary | ICD-10-CM | POA: Diagnosis not present

## 2022-12-08 DIAGNOSIS — L218 Other seborrheic dermatitis: Secondary | ICD-10-CM | POA: Diagnosis not present

## 2022-12-08 DIAGNOSIS — Z08 Encounter for follow-up examination after completed treatment for malignant neoplasm: Secondary | ICD-10-CM | POA: Diagnosis not present

## 2022-12-08 DIAGNOSIS — L814 Other melanin hyperpigmentation: Secondary | ICD-10-CM | POA: Diagnosis not present

## 2022-12-08 DIAGNOSIS — L821 Other seborrheic keratosis: Secondary | ICD-10-CM | POA: Diagnosis not present

## 2022-12-08 DIAGNOSIS — L718 Other rosacea: Secondary | ICD-10-CM | POA: Diagnosis not present

## 2022-12-08 DIAGNOSIS — D225 Melanocytic nevi of trunk: Secondary | ICD-10-CM | POA: Diagnosis not present

## 2022-12-08 DIAGNOSIS — D485 Neoplasm of uncertain behavior of skin: Secondary | ICD-10-CM | POA: Diagnosis not present

## 2022-12-08 DIAGNOSIS — L905 Scar conditions and fibrosis of skin: Secondary | ICD-10-CM | POA: Diagnosis not present

## 2023-01-19 DIAGNOSIS — Z961 Presence of intraocular lens: Secondary | ICD-10-CM | POA: Diagnosis not present

## 2023-01-19 DIAGNOSIS — H35361 Drusen (degenerative) of macula, right eye: Secondary | ICD-10-CM | POA: Diagnosis not present

## 2023-01-19 DIAGNOSIS — H35033 Hypertensive retinopathy, bilateral: Secondary | ICD-10-CM | POA: Diagnosis not present

## 2023-01-19 DIAGNOSIS — H401131 Primary open-angle glaucoma, bilateral, mild stage: Secondary | ICD-10-CM | POA: Diagnosis not present

## 2023-02-16 NOTE — Progress Notes (Signed)
[facility] - Clinic Note  02/22/2023     CHIEF COMPLAINT Patient presents for Retina Follow Up   HISTORY OF PRESENT ILLNESS: Nicole Holt is a 82 y.o. female who presents to the clinic today for:   HPI     Retina Follow Up   Patient presents with  Other.  In right eye.  This started 1 year ago.  I, the attending physician,  performed the HPI with the patient and updated documentation appropriately.        Comments   Patient here for 1 year retina follow up for retained lens material OD. Patient states vision  doing good. No eye pain. Just stinging recently- allergies. Saw Dr Elmer Picker and she changed latanoprost to Rocklatan QHS OU.      Last edited by Rennis Chris, MD on 02/24/2023 12:03 AM.    Pt feels like her vision is doing well, she was concerned when she saw Dr. Zenaida Niece last month bc her IOP was high, Dr. Zenaida Niece changed her drops from latanoprost to [facility], she feels like her eyes have been red since starting the new drops, pt canceled her follow up appt with Dr. Zenaida Niece bc she assumed her IOP would be lower today, she does not go back to her office until August   Referring physician: Diona Foley, MD [address] [place],  [place] [address]  HISTORICAL INFORMATION:   Selected notes from the MEDICAL RECORD NUMBER Referred by Dr. Harlon Flor for retained lens material OD LEE:  Ocular Hx- PMH-    CURRENT MEDICATIONS: Current Outpatient Medications (Ophthalmic Drugs)  Medication Sig   Netarsudil-Latanoprost (ROCKLATAN) 0.02-0.005 % SOLN Apply 1 drop to eye at bedtime. OU   latanoprost (XALATAN) 0.005 % ophthalmic solution  (Patient not taking: Reported on 02/22/2023)   No current facility-administered medications for this visit. (Ophthalmic Drugs)   Current Outpatient Medications (Other)  Medication Sig   diazepam (VALIUM) 5 MG tablet Take 1 tablet (5 mg total) by mouth every 6 (six) hours as needed. for anxiety   fluticasone (FLONASE) 50 MCG/ACT  nasal spray Place 1 spray into both nostrils daily.   melatonin 3 MG TABS tablet Take 3 mg by mouth at bedtime as needed (sleep).   polyethylene glycol (MIRALAX / GLYCOLAX) 17 g packet Take 17 g by mouth daily as needed.   Turmeric (QC TUMERIC COMPLEX PO) Take 1 capsule by mouth daily.    No current facility-administered medications for this visit. (Other)   REVIEW OF SYSTEMS: ROS   Positive for: Neurological, Musculoskeletal, HENT, Eyes, Respiratory Negative for: Constitutional, Gastrointestinal, Skin, Genitourinary, Endocrine, Cardiovascular, Psychiatric, Allergic/Imm, Heme/Lymph Last edited by Laddie Aquas, COA on 02/22/2023  1:50 PM.      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    Osteoporosis    Sleep apnea    Tinnitus    Past Surgical History:  Procedure Laterality Date   AIR/FLUID EXCHANGE Right 09/12/2020   Procedure: AIR/FLUID EXCHANGE;  Surgeon: Rennis Chris, MD;  Location: [facility] OR;  Service: Ophthalmology;  Laterality: Right;   CATARACT EXTRACTION Bilateral OD: 10.25.21 OS: 12.2015   Dr. Harlon Flor   cataract surgery     COSMETIC SURGERY     EYE SURGERY Bilateral    Cat Sx - Dr. Harlon Flor   EYE SURGERY Right 09/12/2020   PPV/EL for retained lens material - Dr. Rennis Chris   PARS PLANA VITRECTOMY Right 09/12/2020   Procedure: PARS PLANA VITRECTOMY WITH 23 GAUGE;  Surgeon:  Rennis Chris, MD;  Location: [facility] OR;  Service: Ophthalmology;  Laterality: Right;   PHOTOCOAGULATION WITH LASER Right 09/12/2020   Procedure: PHOTOCOAGULATION WITH LASER;  Surgeon: Rennis Chris, MD;  Location: [facility] OR;  Service: Ophthalmology;  Laterality: Right;   REMOVAL RETAINED LENS Right 09/12/2020   Procedure: REMOVAL RETAINED LENS;  Surgeon: Rennis Chris, MD;  Location: [facility] OR;  Service: Ophthalmology;  Laterality: Right;   FAMILY HISTORY Family History  Problem Relation Age of Onset   Osteoporosis Mother    Stroke Mother    Hyperlipidemia Mother    Hypertension Mother    Kidney disease Father    Congestive Heart Failure Father    Heart disease Father    Hyperlipidemia Father    Stroke Sister    Parkinson's disease Sister    Heart disease Sister    Heart disease Maternal Grandmother    Gout Maternal Grandmother    Stroke Maternal Grandfather    Stroke Paternal Grandmother    Thrombosis Paternal Grandfather    Early death Paternal Grandfather    Hypertension Brother    Diabetes Brother    Arthritis/Rheumatoid Other    Parkinson's disease Other    Heart disease Other    Obesity Daughter    Breast cancer Neg Hx    SOCIAL HISTORY Social History   Tobacco Use   Smoking status: Former   Smokeless tobacco: Never   Tobacco comments:    Quit in 1987  Vaping Use   Vaping Use: Never used  Substance Use Topics   Alcohol use: Yes    Alcohol/week: 12.0 standard drinks of alcohol    Types: 12 Glasses of wine per week    Comment: wine or alcohol daily   Drug use: No       OPHTHALMIC EXAM:  Base Eye Exam     Visual Acuity (Snellen - Linear)       Right Left   Dist [place] 20/20 20/50   Dist ph [place]  20/20 -1         Tonometry (Tonopen, 1:46 PM)       Right Left   Pressure 09 13         Pupils       Dark Light Shape React APD   Right 3 2 Round Slow None   Left 3 2 Round Slow None         Visual Fields (Counting fingers)       Left Right    Full Full          Extraocular Movement       Right Left    Full, Ortho Full, Ortho         Neuro/Psych  Oriented x3: Yes   Mood/Affect: Normal         Dilation     Both eyes: 1.0% Mydriacyl, 2.5% Phenylephrine @ 1:46 PM           Slit Lamp and Fundus Exam     Slit Lamp Exam       Right Left   Lids/Lashes Dermatochalasis - upper lid Dermatochalasis - upper lid   Conjunctiva/Sclera 2+ Injection 2+ Injection   Cornea Trace Punctate epithelial erosions, well healed temporal cataract wound, mild arcus 1-2+Punctate epithelial erosions, well healed temporal cataract wounds   Anterior Chamber deep and clear deep and clear   Iris Round and dilated, mild iridodenesis Round and dilated   Lens 3 piece sulcus IOL in good position, open PC, cortical remnants resolved PC IOL in good position with trace PCO non-central   Anterior Vitreous post vitrectomy, trace vitreous condensations syneresis         Fundus Exam       Right Left   Disc mild tilt, mild pallor, +cupping, mild PPP, Sharp rim Pink and Sharp, mild tilt, IT PPA   C/D Ratio 0.7 0.6   Macula Flat, Blunted foveal reflex, mild RPE mottling, fine drusen, No heme or edema Flat, Blunted foveal reflex, mild RPE mottling, rare, fine drusen, No heme or edema   Vessels attenuated, Tortuous attenuated, Tortuous   Periphery attached, retained lens material gone; 360 peripheral laser changes, reticular degeneration, No heme Attached               IMAGING AND PROCEDURES  Imaging and Procedures for 02/22/2023  OCT, Retina - OU - Both Eyes       Right Eye Quality was good. Central Foveal Thickness: 303. Progression has been stable. Findings include normal foveal contour, no IRF, no SRF, retinal drusen (Persistent drusen greatest SN mac).   Left Eye Quality was good. Central Foveal Thickness: 284. Progression has been stable. Findings include normal foveal contour, no IRF, no SRF, retinal drusen .   Notes *Images captured  and stored on drive  Diagnosis / Impression:  NFP, no IRF/SRF OU OD: Persistent drusen greatest SN mac  Clinical management:  See below  Abbreviations: NFP - Normal foveal profile. CME - cystoid macular edema. PED - pigment epithelial detachment. IRF - intraretinal fluid. SRF - subretinal fluid. EZ - ellipsoid zone. ERM - epiretinal membrane. ORA - outer retinal atrophy. ORT - outer retinal tubulation. SRHM - subretinal hyper-reflective material. IRHM - intraretinal hyper-reflective material           ASSESSMENT/PLAN:    ICD-10-CM   1. 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 Essential hypertension  I10     3. 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 Pseudophakia, both eyes  Z96.1     6. Primary open angle glaucoma of both eyes, unspecified glaucoma stage  H40.1130      1. Retained lens material OD  - s/p CE, sulcus IOL on Monday 10.25.21  - broken posterior capsule, nuclear and cortical lens fragments in vitreous  - had IOP spike to 40 on 11.3.21  - improved post drops, AC tap and diamox by Dr. Harlon Flor  - persistently elevated IOP and microcystic corneal edema  - s/p PPV/EL OD 11.11.21 for removal of retained lens material             -  doing well  - [facility] 20/20 OD -- stable             - IOP good at 20  - Rocklatan qhs OU   - pt is cleared from a retina standpoint for release to Dr. Zenaida Niece and resumption of primary eye care  2,3. Hypertensive retinopathy OU - discussed importance of tight BP control - monitor   4. Drusen OU  5. Pseudophakia OU  - s/p CE/IOL (Dr. Harlon Flor, OD: 10.25.21, OS: 12.2015)  - IOLs in good position  - retained lens material OD as above - monitor   6. History of POAG  - IOP 09,13 on Rocklatan qhs OU   - recently switched from Latanoprost to Rocklatan, but 1-2 + injection since starting the Rocklatan  - now under the  expert management of Dr. Zenaida Niece   Ophthalmic Meds Ordered this visit:  No orders of the defined types were placed in this encounter.    Return if symptoms worsen or fail to improve.  There are no Patient Instructions on file for this visit.   Explained the diagnoses, plan, and follow up with the patient and they expressed understanding.  Patient expressed understanding of the importance of proper follow up care.   This document serves as a record of services personally performed by Karie Chimera, MD, PhD. It was created on their behalf by De Blanch, an ophthalmic technician. The creation of this record is the provider's dictation and/or activities during the visit.    Electronically signed by: De Blanch, OA, 02/24/23  12:07 AM  This document serves as a record of services personally performed by Karie Chimera, MD, PhD. It was created on their behalf by Glee Arvin. Manson Passey, OA an ophthalmic technician. The creation of this record is the provider's dictation and/or activities during the visit.    Electronically signed by: Glee Arvin. Manson Passey, [place] 04.22.2024 12:07 AM  Karie Chimera, M.D., Ph.D. Diseases & Surgery of the Retina and Vitreous [facility]  I have reviewed the above documentation for accuracy and completeness, and I agree with the above. Karie Chimera, M.D., Ph.D. 02/24/23 12:07 AM   Abbreviations: M myopia (nearsighted); A astigmatism; H hyperopia (farsighted); P presbyopia; Mrx spectacle prescription;  CTL contact lenses; OD right eye; OS left eye; OU both eyes  XT exotropia; ET esotropia; PEK punctate epithelial keratitis; PEE punctate epithelial erosions; DES dry eye syndrome; MGD meibomian gland dysfunction; ATs artificial tears; PFAT's preservative free artificial tears; NSC nuclear sclerotic cataract; PSC posterior subcapsular cataract; ERM epi-retinal membrane; PVD posterior vitreous detachment; RD retinal detachment; DM diabetes  mellitus; DR diabetic retinopathy; NPDR non-proliferative diabetic retinopathy; PDR proliferative diabetic retinopathy; CSME clinically significant macular edema; DME diabetic macular edema; dbh dot blot hemorrhages; CWS cotton wool spot; POAG primary open angle glaucoma; C/D cup-to-disc ratio; HVF humphrey visual field; GVF goldmann visual field; OCT optical coherence tomography; IOP intraocular pressure; BRVO Branch retinal vein occlusion; CRVO central retinal vein occlusion; CRAO central retinal artery occlusion; BRAO branch retinal artery occlusion; RT retinal tear; SB scleral buckle; PPV pars plana vitrectomy; VH Vitreous hemorrhage; PRP panretinal laser photocoagulation; IVK intravitreal kenalog; VMT vitreomacular traction; MH Macular hole;  NVD neovascularization of the disc; NVE neovascularization elsewhere; AREDS age related eye disease study; ARMD age related macular degeneration; POAG primary open angle glaucoma; EBMD epithelial/anterior basement membrane dystrophy; ACIOL anterior chamber intraocular lens; IOL intraocular lens; PCIOL posterior chamber intraocular lens; Phaco/IOL phacoemulsification with intraocular lens placement; PRK photorefractive  keratectomy; LASIK laser assisted in situ keratomileusis; HTN hypertension; DM diabetes mellitus; COPD chronic obstructive pulmonary disease

## 2023-02-22 ENCOUNTER — Encounter (INDEPENDENT_AMBULATORY_CARE_PROVIDER_SITE_OTHER): Payer: Self-pay | Admitting: Ophthalmology

## 2023-02-22 ENCOUNTER — Ambulatory Visit (INDEPENDENT_AMBULATORY_CARE_PROVIDER_SITE_OTHER): Payer: Medicare HMO | Admitting: Ophthalmology

## 2023-02-22 DIAGNOSIS — H35363 Drusen (degenerative) of macula, bilateral: Secondary | ICD-10-CM | POA: Diagnosis not present

## 2023-02-22 DIAGNOSIS — H401130 Primary open-angle glaucoma, bilateral, stage unspecified: Secondary | ICD-10-CM | POA: Diagnosis not present

## 2023-02-22 DIAGNOSIS — I10 Essential (primary) hypertension: Secondary | ICD-10-CM

## 2023-02-22 DIAGNOSIS — H35033 Hypertensive retinopathy, bilateral: Secondary | ICD-10-CM | POA: Diagnosis not present

## 2023-02-22 DIAGNOSIS — H59021 Cataract (lens) fragments in eye following cataract surgery, right eye: Secondary | ICD-10-CM

## 2023-02-22 DIAGNOSIS — Z961 Presence of intraocular lens: Secondary | ICD-10-CM

## 2023-02-24 ENCOUNTER — Encounter (INDEPENDENT_AMBULATORY_CARE_PROVIDER_SITE_OTHER): Payer: Self-pay | Admitting: Ophthalmology

## 2023-03-19 ENCOUNTER — Encounter: Payer: Self-pay | Admitting: Physician Assistant

## 2023-03-19 ENCOUNTER — Ambulatory Visit (INDEPENDENT_AMBULATORY_CARE_PROVIDER_SITE_OTHER): Payer: Medicare HMO | Admitting: Physician Assistant

## 2023-03-19 VITALS — BP 130/76 | HR 65 | Temp 97.3°F | Ht 63.0 in | Wt 177.0 lb

## 2023-03-19 DIAGNOSIS — T161XXA Foreign body in right ear, initial encounter: Secondary | ICD-10-CM | POA: Diagnosis not present

## 2023-03-19 NOTE — Progress Notes (Signed)
Nicole Holt is a 82 y.o. female here for a new problem.  History of Present Illness:   Chief Complaint  Patient presents with   Ear Pain    Pt c/o right ear pain x 1 month, has hearing aide. She saw hearing aide provider last Wed and was told something is in her ear needs to see ENT.    HPI  Right Ear Pain Patient is complaining of right ear pain occurring one month ago. She saw her hearing aide provider who told her she needs to see an ENT specialist.    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    Osteoporosis    Sleep apnea    Tinnitus      Social History   Tobacco Use   Smoking status: Former   Smokeless tobacco: Never   Tobacco comments:    Quit in 1987  Vaping Use   Vaping Use: Never used  Substance Use Topics   Alcohol use: Yes    Alcohol/week: 12.0 standard drinks of alcohol    Types: 12 Glasses of wine per week    Comment: wine or alcohol daily   Drug use: No    Past Surgical History:  Procedure Laterality Date   AIR/FLUID EXCHANGE Right 09/12/2020   Procedure: AIR/FLUID EXCHANGE;  Surgeon: Rennis Chris, MD;  Location: [facility] OR;  Service: Ophthalmology;  Laterality: Right;   CATARACT EXTRACTION Bilateral OD: 10.25.21 OS: 12.2015   Dr. Harlon Flor   cataract surgery     COSMETIC SURGERY     EYE SURGERY Bilateral    Cat Sx - Dr. Harlon Flor   EYE SURGERY Right 09/12/2020   PPV/EL for retained lens material - Dr. Rennis Chris   PARS PLANA VITRECTOMY Right 09/12/2020   Procedure: PARS PLANA VITRECTOMY WITH 23 GAUGE;  Surgeon: Rennis Chris, MD;  Location: [facility] OR;  Service: Ophthalmology;   Laterality: Right;   PHOTOCOAGULATION WITH LASER Right 09/12/2020   Procedure: PHOTOCOAGULATION WITH LASER;  Surgeon: Rennis Chris, MD;  Location: [facility] OR;  Service: Ophthalmology;  Laterality: Right;   REMOVAL RETAINED LENS Right 09/12/2020   Procedure: REMOVAL RETAINED LENS;  Surgeon: Rennis Chris, MD;  Location: [facility] OR;  Service: Ophthalmology;  Laterality: Right;    Family History  Problem Relation Age of Onset   Osteoporosis Mother    Stroke Mother    Hyperlipidemia Mother    Hypertension Mother    Kidney disease Father    Congestive Heart Failure Father    Heart disease Father    Hyperlipidemia Father    Stroke Sister    Parkinson's disease Sister    Heart disease Sister    Heart disease Maternal Grandmother    Gout Maternal Grandmother    Stroke Maternal Grandfather    Stroke Paternal Grandmother    Thrombosis Paternal Grandfather    Early death Paternal Grandfather    Hypertension Brother    Diabetes Brother    Arthritis/Rheumatoid Other    Parkinson's disease  Other    Heart disease Other    Obesity Daughter    Breast cancer Neg Hx     Allergies  Allergen Reactions   Codeine Nausea Only   Estrogens Conjugated Other (See Comments)    HA   Paroxetine Hcl Other (See Comments)    Felt like she was "out of her head"    Current Medications:   Current Outpatient Medications:    diazepam (VALIUM) 5 MG tablet, Take 1 tablet (5 mg total) by mouth every 6 (six) hours as needed. for anxiety, Disp: 30 tablet, Rfl: 0   latanoprost (XALATAN) 0.005 % ophthalmic solution, , Disp: , Rfl:    melatonin 3 MG TABS tablet, Take 3 mg by mouth at bedtime as needed (sleep)., Disp: , Rfl:    polyethylene glycol (MIRALAX / GLYCOLAX) 17 g packet, Take 17 g by mouth daily as needed., Disp: , Rfl:    Review of Systems:   Review of Systems  HENT:  Positive for ear pain (right).     Vitals:   Vitals:   03/19/23 1008  BP: 130/76  Pulse: 65  Temp: (!) 97.3 F (36.3 C)  TempSrc:  Temporal  SpO2: 96%  Weight: 177 lb (80.3 kg)  Height: 5\' 3"  (1.6 m)     Body mass index is 31.35 kg/m.  Physical Exam:   Physical Exam Vitals and nursing note reviewed.  Constitutional:      General: She is not in acute distress.    Appearance: Normal appearance. She is well-developed. She is not ill-appearing or toxic-appearing.  HENT:     Head: Normocephalic and atraumatic.     Right Ear: External ear normal. A foreign body is present.     Left Ear: Tympanic membrane and external ear normal. No foreign body. Tympanic membrane is not erythematous, retracted or bulging.     Nose: Nose normal.     Right Sinus: No maxillary sinus tenderness or frontal sinus tenderness.     Left Sinus: No maxillary sinus tenderness or frontal sinus tenderness.     Mouth/Throat:     Pharynx: Uvula midline. No posterior oropharyngeal erythema.  Eyes:     General: Lids are normal.     Extraocular Movements: Extraocular movements intact.     Conjunctiva/sclera: Conjunctivae normal.     Pupils: Pupils are equal, round, and reactive to light.  Neck:     Trachea: Trachea normal.  Cardiovascular:     Rate and Rhythm: Normal rate and regular rhythm.     Heart sounds: Normal heart sounds, S1 normal and S2 normal. No murmur heard.    No gallop.  Pulmonary:     Effort: Pulmonary effort is normal. No respiratory distress.     Breath sounds: Normal breath sounds. No decreased breath sounds, wheezing, rhonchi or rales.  Lymphadenopathy:     Cervical: No cervical adenopathy.  Skin:    General: Skin is warm and dry.  Neurological:     Mental Status: She is alert and oriented to person, place, and time.  Psychiatric:        Speech: Speech normal.        Behavior: Behavior normal. Behavior is cooperative.        Judgment: Judgment normal.    Foreign body in ear Consent: Risks and benefits of therapy discussed with patient who voices understanding and agrees with planned care. No barriers to  communication or understanding identified.  After obtaining informed consent, the patient's identity, procedure, and site were  verified during a pause prior to proceeding with the minor procedure as per universal protocol recommendations. Pt aware of risks not limited to but including infection, bleeding, damage to near by organs.  Meds, vitals, and allergies reviewed.  Indication: foreign body Pt complaints of: decreased hearing in right ear  Mechanical extraction is performed using an otoscope.  Forceps are introduced through the otoscope lens and carefully advanced to the foreign body which was grasped and removed.  Tolerated well without significant blood loss or obvious complications   Assessment and Plan:   Foreign body of right ear, initial encounter Tolerated procedure well No evidence of infection/inflammation of tympanic membrane on my exam Discouraged further use of q-tips    I,Verona Buck,acting as a scribe for [employer], PA.,have documented all relevant documentation on the behalf of Jarold Motto, PA,as directed by  Jarold Motto, PA while in the presence of Jarold Motto, [place].  I, Jarold Motto, [place], have reviewed all documentation for this visit. The documentation on 03/19/23 for the exam, diagnosis, procedures, and orders are all accurate and complete.  Jarold Motto, PA-C

## 2023-07-19 ENCOUNTER — Other Ambulatory Visit: Payer: Self-pay | Admitting: Physician Assistant

## 2023-07-19 DIAGNOSIS — F419 Anxiety disorder, unspecified: Secondary | ICD-10-CM

## 2023-07-20 NOTE — Telephone Encounter (Signed)
Pt requesting refill for Valium 5 mg. Last OV 03/19/2023.

## 2023-09-07 ENCOUNTER — Other Ambulatory Visit: Payer: Self-pay | Admitting: Physician Assistant

## 2023-09-07 DIAGNOSIS — J3489 Other specified disorders of nose and nasal sinuses: Secondary | ICD-10-CM

## 2023-09-17 DIAGNOSIS — H401131 Primary open-angle glaucoma, bilateral, mild stage: Secondary | ICD-10-CM | POA: Diagnosis not present

## 2023-10-05 ENCOUNTER — Other Ambulatory Visit: Payer: Self-pay | Admitting: Physician Assistant

## 2023-10-05 DIAGNOSIS — F419 Anxiety disorder, unspecified: Secondary | ICD-10-CM

## 2023-10-06 ENCOUNTER — Other Ambulatory Visit: Payer: Self-pay | Admitting: Physician Assistant

## 2023-10-06 DIAGNOSIS — Z1231 Encounter for screening mammogram for malignant neoplasm of breast: Secondary | ICD-10-CM

## 2023-10-22 IMAGING — MG MM DIGITAL SCREENING BILAT W/ TOMO AND CAD
6 of 10 series · 6 of 30 positions shown · non-contrast
Comparison: Previous exam(s).

CLINICAL DATA: Screening.

EXAM:
DIGITAL SCREENING BILATERAL MAMMOGRAM WITH TOMOSYNTHESIS AND CAD
TECHNIQUE: Bilateral screening digital craniocaudal and mediolateral oblique
mammograms were obtained. Bilateral screening digital breast
tomosynthesis was performed. The images were evaluated with
computer-aided detection.

[R MLO synth-2D (1 of 2)]
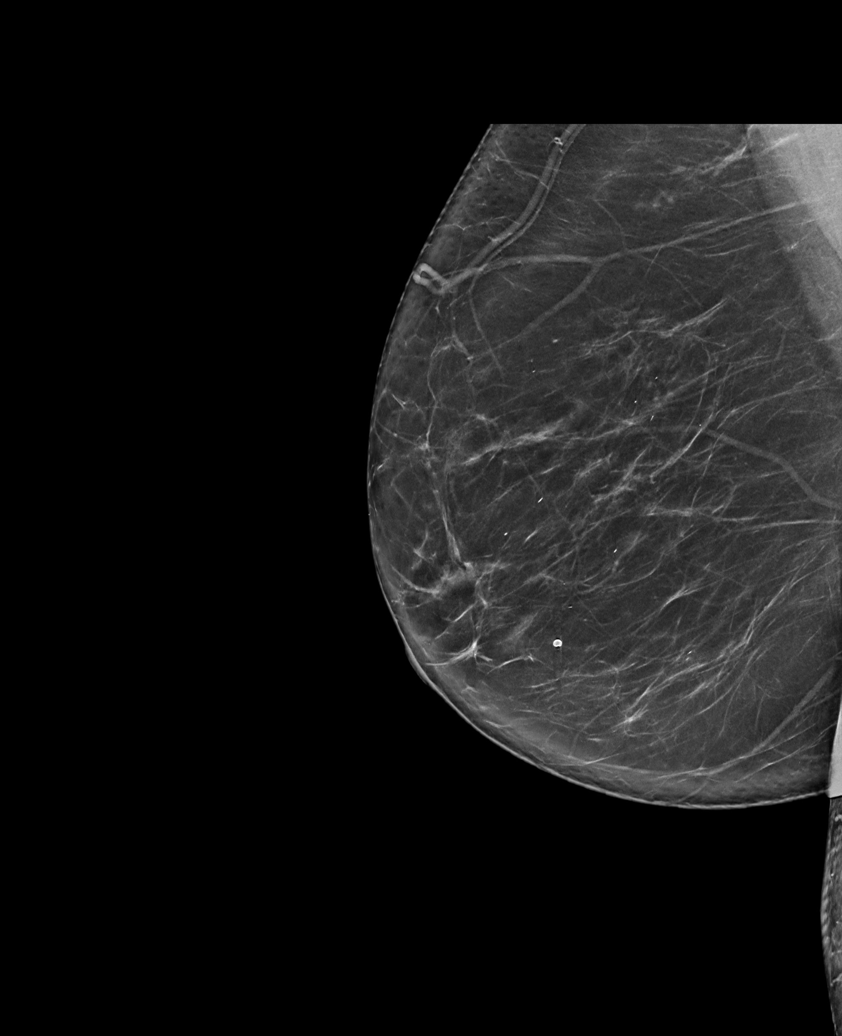

[L CC synth-2D]
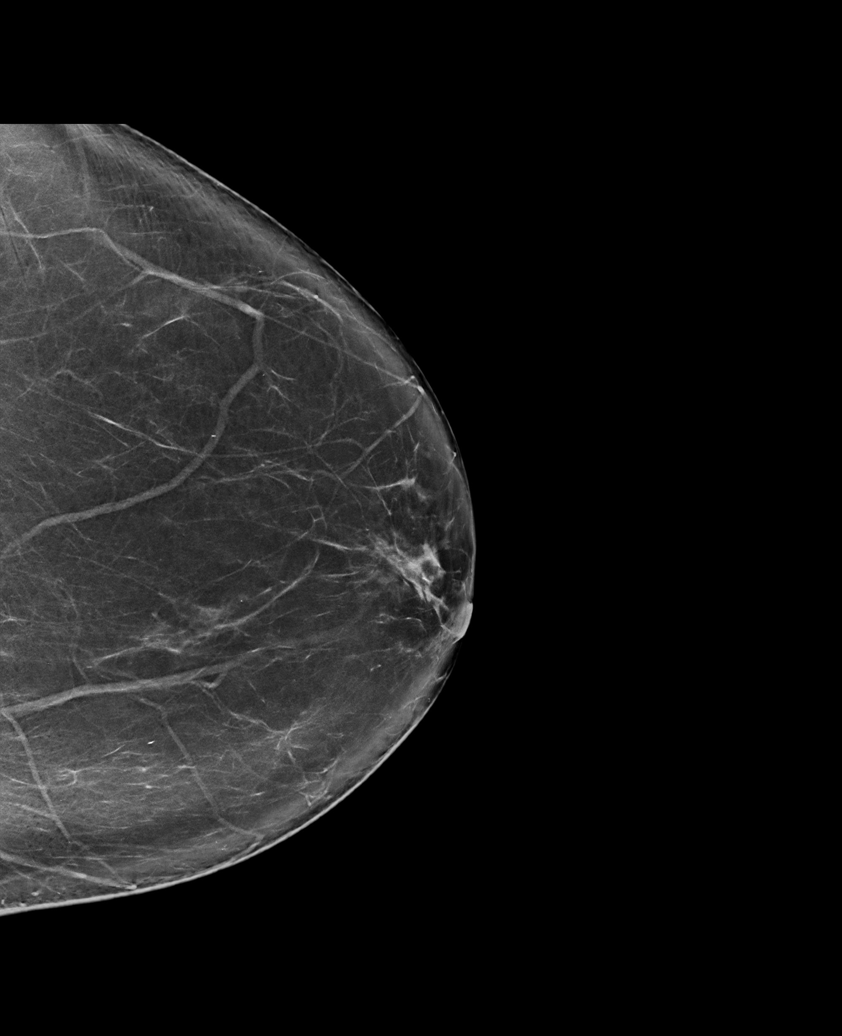

[R CC synth-2D]
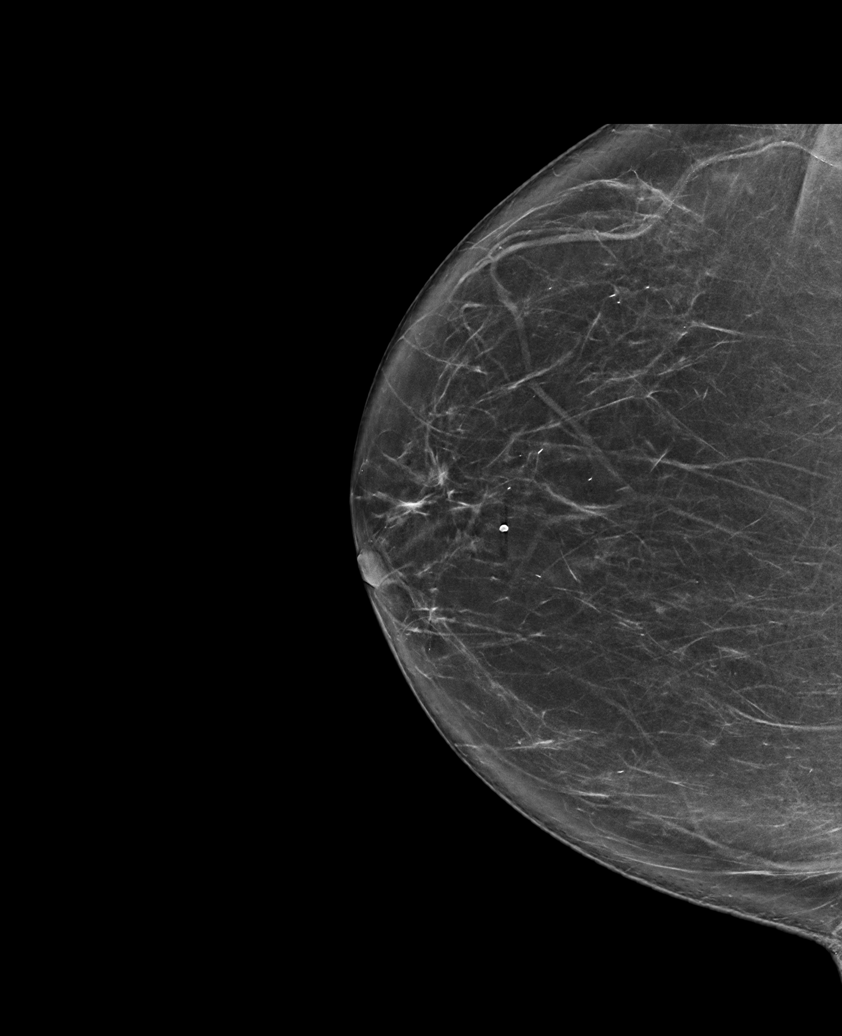

[L MLO synth-2D]
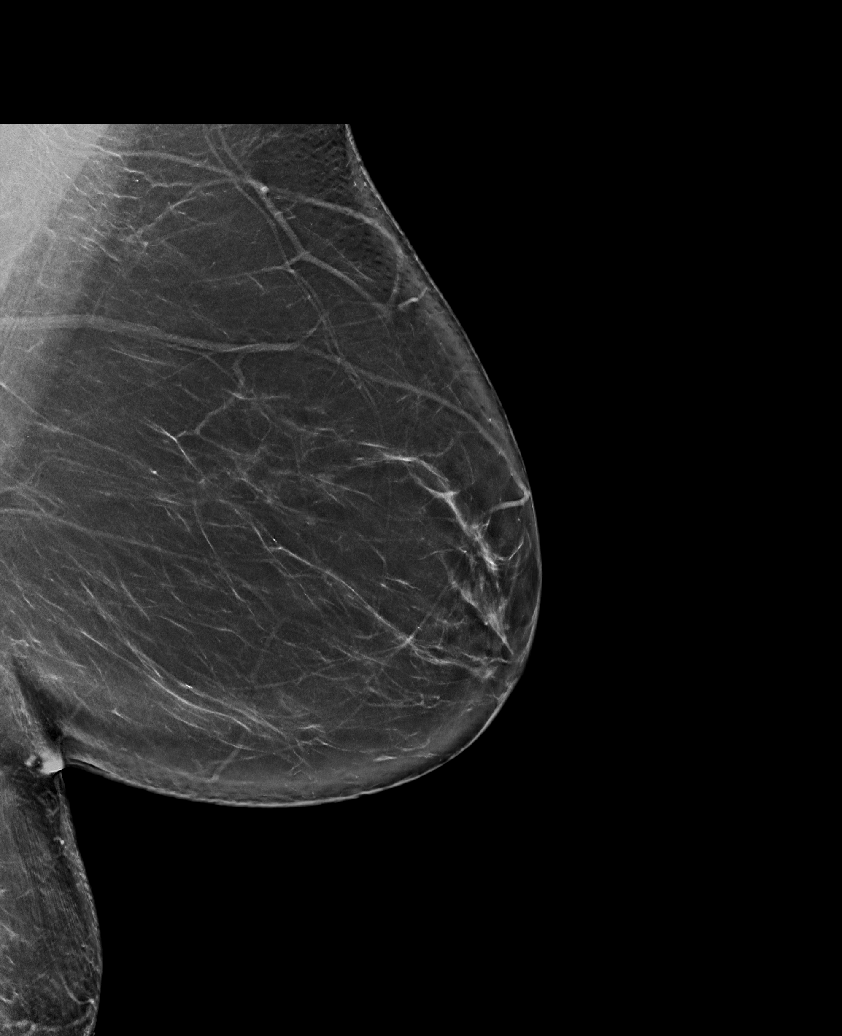

[R MLO synth-2D (2 of 2)]
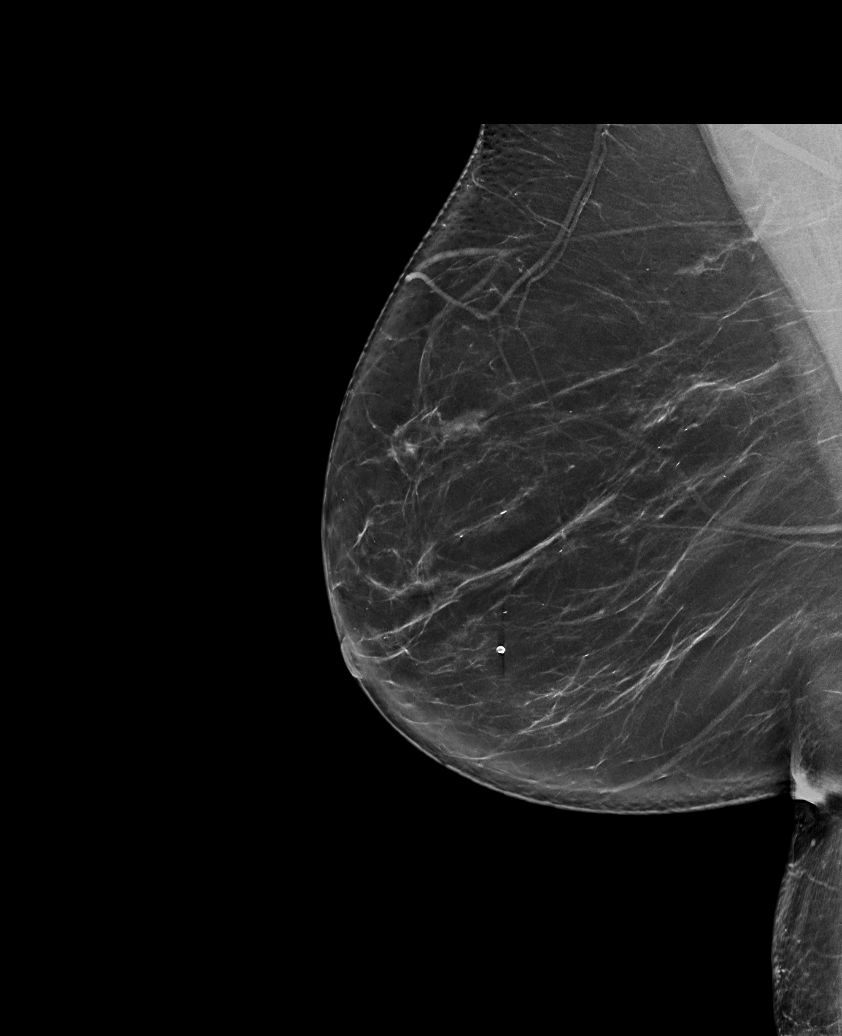

[L MLO tomo · tomo slice 45/88.0]
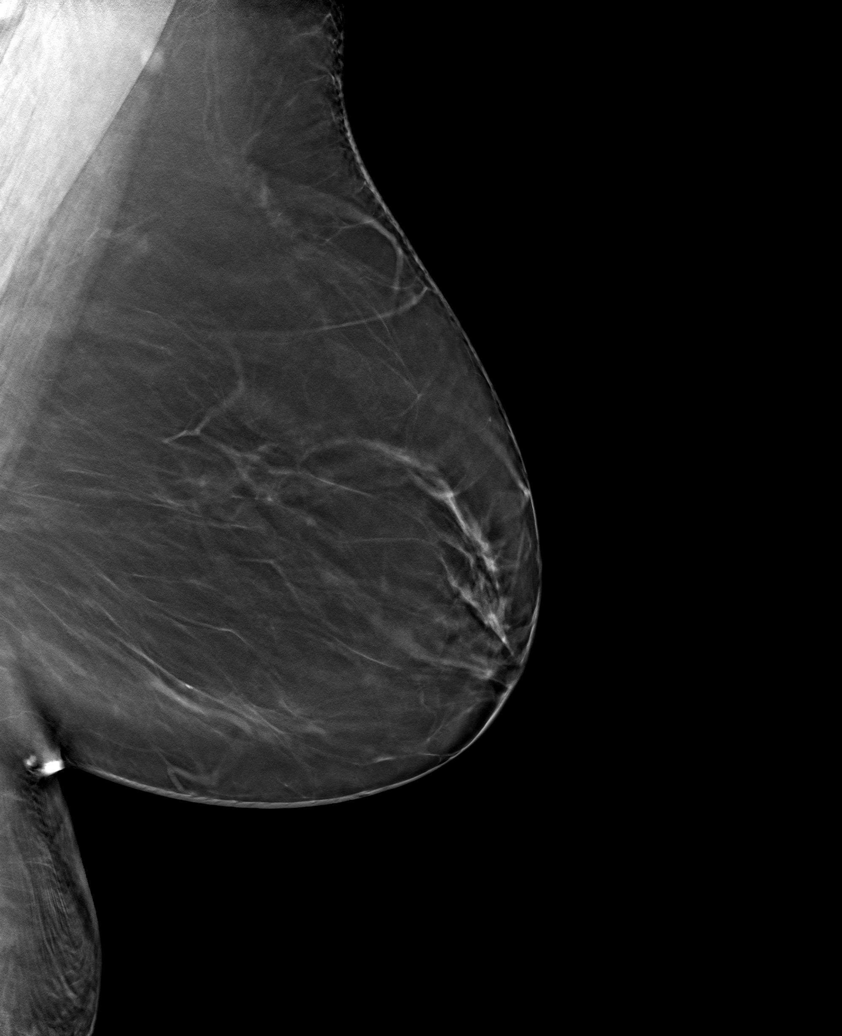

[6 of 30 positions shown; findings below may reference images not displayed]

ACR Breast Density Category b: There are scattered areas of
fibroglandular density.
FINDINGS: There are no findings suspicious for malignancy.
IMPRESSION: No mammographic evidence of malignancy. A result letter of this
screening mammogram will be mailed directly to the patient.

RECOMMENDATION:
Screening mammogram in one year. (Code:51-O-LD2)

BI-RADS CATEGORY  1: Negative.

## 2023-11-16 ENCOUNTER — Ambulatory Visit
Admission: RE | Admit: 2023-11-16 | Discharge: 2023-11-16 | Disposition: A | Discharge status: Home / Self Care | Payer: Medicare HMO | Source: Ambulatory Visit | Attending: Physician Assistant

## 2023-11-16 DIAGNOSIS — Z1231 Encounter for screening mammogram for malignant neoplasm of breast: Secondary | ICD-10-CM | POA: Diagnosis not present

## 2023-11-23 ENCOUNTER — Ambulatory Visit (INDEPENDENT_AMBULATORY_CARE_PROVIDER_SITE_OTHER): Payer: Medicare HMO

## 2023-11-23 VITALS — BP 118/64 | HR 64 | Temp 98.0°F | Wt 176.0 lb

## 2023-11-23 DIAGNOSIS — Z0000 Encounter for general adult medical examination without abnormal findings: Secondary | ICD-10-CM

## 2023-11-23 NOTE — Patient Instructions (Signed)
Nicole Holt , Thank you for taking time to come for your Medicare Wellness Visit. I appreciate your ongoing commitment to your health goals. Please review the following plan we discussed and let me know if I can assist you in the future.   Referrals/Orders/Follow-Ups/Clinician Recommendations: lose weight  Aim for 30 minutes of exercise or brisk walking, 6-8 glasses of water, and 5 servings of fruits and vegetables each day.   This is a list of the screening recommended for you and due dates:  Health Maintenance  Topic Date Due   COVID-19 Vaccine (5 - 2024-25 season) 07/04/2023   Medicare Annual Wellness Visit  11/17/2023   Zoster (Shingles) Vaccine (1 of 2) 03/18/2024*   Flu Shot  09/28/2027*   DTaP/Tdap/Td vaccine (3 - Td or Tdap) 11/12/2030   Pneumonia Vaccine  Completed   DEXA scan (bone density measurement)  Completed   HPV Vaccine  Aged Out  *Topic was postponed. The date shown is not the original due date.    Advanced directives: (In Chart) A copy of your advanced directives are scanned into your chart should your provider ever need it.  Next Medicare Annual Wellness Visit scheduled for next year: Yes

## 2023-11-23 NOTE — Progress Notes (Signed)
Subjective:   Nicole Holt is a 83 y.o. female who presents for Medicare Annual (Subsequent) preventive examination.  Visit Complete: In person  .  Cardiac Risk Factors include: advanced age (>24men, >22 women);dyslipidemia;obesity (BMI >30kg/m2)     Objective:    Today's Vitals   11/23/23 1258  BP: 118/64  Pulse: 64  Temp: 98 F (36.7 C)  SpO2: 94%  Weight: 176 lb (79.8 kg)   Body mass index is 31.18 kg/m.     11/23/2023    1:07 PM 11/16/2022    1:19 PM 11/13/2021   11:20 AM 11/07/2020   11:21 AM 09/12/2020   12:28 PM 10/23/2019    1:22 PM 10/14/2018   10:17 AM  Advanced Directives  Does Patient Have a Medical Advance Directive? Yes Yes Yes Yes Yes Yes Yes  Type of Estate agent of [place];Living will Healthcare Power of [place];Living will Healthcare Power of [employer] of [place];Living will Living will Living will;Healthcare Power of [employer] Power of [place];Living will  Does patient want to make changes to medical advance directive? No - Patient declined No - Patient declined    No - Patient declined No - Patient declined  Copy of Healthcare Power of Attorney in Chart? Yes - validated most recent copy scanned in chart (See row information) Yes - validated most recent copy scanned in chart (See row information) Yes - validated most recent copy scanned in chart (See row information) Yes - validated most recent copy scanned in chart (See row information)  Yes - validated most recent copy scanned in chart (See row information) No - copy requested    Current Medications (verified) Outpatient Encounter Medications as of 11/23/2023  Medication Sig   diazepam (VALIUM) 5 MG tablet TAKE 1 TABLET BY MOUTH EVERY 6 HOURS AS NEEDED FOR ANXIETY   fluticasone (FLONASE) 50 MCG/ACT nasal spray Use 1 spray(s) in each nostril once daily   latanoprost (XALATAN) 0.005 % ophthalmic solution    polyethylene glycol (MIRALAX / GLYCOLAX) 17 g  packet Take 17 g by mouth daily as needed.   [DISCONTINUED] melatonin 3 MG TABS tablet Take 3 mg by mouth at bedtime as needed (sleep).   No facility-administered encounter medications on file as of 11/23/2023.    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    Osteoporosis    Sleep apnea    Tinnitus    Past Surgical History:  Procedure Laterality Date   AIR/FLUID EXCHANGE Right 09/12/2020   Procedure: AIR/FLUID EXCHANGE;  Surgeon: Rennis Chris, MD;  Location: [facility] OR;  Service: Ophthalmology;  Laterality: Right;   CATARACT EXTRACTION Bilateral OD: 10.25.21 OS: 12.2015   Dr. Harlon Flor   cataract surgery     COSMETIC SURGERY     EYE SURGERY Bilateral    Cat Sx - Dr. Harlon Flor   EYE SURGERY Right 09/12/2020   PPV/EL for retained lens material -  Dr. Rennis Chris   PARS PLANA VITRECTOMY Right 09/12/2020   Procedure: PARS PLANA VITRECTOMY WITH 23 GAUGE;  Surgeon: Rennis Chris, MD;  Location: [facility] OR;  Service: Ophthalmology;  Laterality: Right;   PHOTOCOAGULATION WITH LASER Right 09/12/2020   Procedure: PHOTOCOAGULATION WITH LASER;  Surgeon: Rennis Chris, MD;  Location: [facility] OR;  Service: Ophthalmology;  Laterality: Right;   REMOVAL RETAINED LENS Right 09/12/2020   Procedure: REMOVAL RETAINED LENS;  Surgeon: Rennis Chris, MD;  Location: [facility] OR;  Service: Ophthalmology;  Laterality: Right;   Family History  Problem Relation Age of Onset   Osteoporosis Mother    Stroke Mother    Hyperlipidemia Mother    Hypertension Mother    Kidney  disease Father    Congestive Heart Failure Father    Heart disease Father    Hyperlipidemia Father    Stroke Sister    Parkinson's disease Sister    Heart disease Sister    Heart disease Maternal Grandmother    Gout Maternal Grandmother    Stroke Maternal Grandfather    Stroke Paternal Grandmother    Thrombosis Paternal Grandfather    Early death Paternal Grandfather    Hypertension Brother    Diabetes Brother    Arthritis/Rheumatoid Other    Parkinson's disease Other    Heart disease Other    Obesity Daughter    Breast cancer Neg Hx    Social History   Socioeconomic History   Marital status: Married    Spouse name: Casimiro Needle   Number of children: 2   Years of education: 16   Highest education level: Some college, no degree  Occupational History   Occupation: Retired  Tobacco Use   Smoking status: Former   Smokeless tobacco: Never   Tobacco comments:    Quit in 1987  Vaping Use   Vaping status: Never Used  Substance and Sexual Activity   Alcohol use: Yes    Alcohol/week: 12.0 standard drinks of alcohol    Types: 12 Glasses of wine per week    Comment: wine or alcohol daily   Drug use: No   Sexual activity: Yes    Birth control/protection: Post-menopausal  Other Topics Concern   Not on file  Social History Narrative   Patient is married Casimiro Needle) and lives with her husband.   Patient is retired.   Patient has two children.   Patient is right-handed.   Patient has a college education.   Patient drinks two cups of coffee daily.            Social Drivers of Corporate investment banker Strain: Low Risk  (11/23/2023)   Overall Financial Resource Strain (CARDIA)    Difficulty of Paying Living Expenses: Not hard at all  Food Insecurity: No Food Insecurity (11/23/2023)   Hunger Vital Sign    Worried About Running Out of Food in the Last Year: Never true    Ran Out of Food in the Last Year: Never true  Transportation Needs: No Transportation Needs (11/23/2023)    PRAPARE - Administrator, Civil Service (Medical): No    Lack of Transportation (Non-Medical): No  Physical Activity: Insufficiently Active (11/23/2023)   Exercise Vital Sign    Days of Exercise per Week: 2 days    Minutes of Exercise per Session: 60 min  Stress: Stress Concern Present (11/23/2023)   [employer] of Occupational Health - Occupational Stress Questionnaire    Feeling of Stress : To some extent  Social Connections:  Moderately Isolated (11/23/2023)   Social Connection and Isolation Panel [NHANES]    Frequency of Communication with Friends and Family: Once a week    Frequency of Social Gatherings with Friends and Family: More than three times a week    Attends Religious Services: Never    Database administrator or Organizations: No    Attends Engineer, structural: Never    Marital Status: Married    Tobacco Counseling Counseling given: Not Answered Tobacco comments: Quit in 1987   Clinical Intake:  Pre-visit preparation completed: Yes  Pain : No/denies pain     BMI - recorded: 31.18 Nutritional Status: BMI > 30  Obese Nutritional Risks: None Diabetes: No  How often do you need to have someone help you when you read instructions, pamphlets, or other written materials from your doctor or pharmacy?: 1 - Never  Interpreter Needed?: No  Information entered by :: Lanier Ensign, LPN   Activities of Daily Living    11/23/2023    1:03 PM  In your present state of health, do you have any difficulty performing the following activities:  Hearing? 1  Comment hearing aids  Vision? 0  Difficulty concentrating or making decisions? 0  Walking or climbing stairs? 0  Dressing or bathing? 1  Comment at times reaching feet  Doing errands, shopping? 0  Preparing Food and eating ? N  Using the Toilet? N  In the past six months, have you accidently leaked urine? Y  Comment at times  Do you have problems with loss of bowel control? N  Managing  your Medications? N  Managing your Finances? N  Housekeeping or managing your Housekeeping? N    Patient Care Team: Jarold Motto, [place] as PCP - General (Physician Assistant) Daphine Deutscher Dale , NP as Nurse Practitioner (Neurology) Chalmers Guest, MD as Consulting Physician (Ophthalmology) Dohmeier, Porfirio Mylar, MD as Consulting Physician (Neurology) Dentistry, [facility], [place], [facility] (Inactive) as Pharmacist (Pharmacist)  Indicate any recent Medical Services you may have received from other than [facility] providers in the past year (date may be approximate).     Assessment:   This is a routine wellness examination for Reaghan.  Hearing/Vision screen Hearing Screening - Comments:: Pt has hearing aids  Vision Screening - Comments:: [facility] for [facility] eye exams    Goals Addressed             This Visit's Progress    Patient Stated       Lose weight        Depression Screen    11/23/2023    1:06 PM 11/24/2022    1:13 PM 11/16/2022    1:17 PM 05/06/2022   11:03 AM 11/13/2021   11:13 AM 06/20/2021   10:51 AM 11/07/2020   11:13 AM  PHQ 2/9 Scores  PHQ - 2 Score 2 0 1 0 1  3  PHQ- 9 Score 4    2  3   Exception Documentation      Patient refusal     Fall Risk    11/23/2023    1:11 PM 11/24/2022    1:13 PM 11/16/2022    1:20 PM 05/06/2022   11:03 AM 11/13/2021   11:21 AM  Fall Risk   Falls in the past year? 1 0 0 0 0  Number falls in past yr: 1 0 0 0 0  Injury with Fall? 0 0 0 0 0  Risk for fall due to : History of  fall(s)   No Fall Risks   Follow up Falls prevention discussed Falls evaluation completed Falls prevention discussed  Falls prevention discussed    MEDICARE RISK AT HOME: Medicare Risk at Home Any stairs in or around the home?: Yes If so, are there any without handrails?: No Home free of loose throw rugs in walkways, pet beds, electrical cords, etc?: Yes Adequate lighting in your home to reduce risk of falls?: Yes Life alert?:  No Use of a cane, walker or w/c?: No Grab bars in the bathroom?: Yes Shower chair or bench in shower?: No Elevated toilet seat or a handicapped toilet?: No  TIMED UP AND GO:  Was the test performed?  Yes  Length of time to ambulate 10 feet: 10 sec Gait steady and fast without use of assistive device    Cognitive Function: Pt declined related to her follow up with the neurologist     11/23/2023    1:13 PM 10/14/2018   10:32 AM  MMSE - Mini Mental State Exam  Not completed: Refused   Orientation to time  5  Orientation to Place  5  Registration  3  Attention/ Calculation  5  Recall  3  Language- name 2 objects  2  Language- repeat  1  Language- follow 3 step command  3  Language- read & follow direction  1  Write a sentence  1  Copy design  1  Total score  30      11/30/2022   10:26 AM 11/27/2021   10:22 AM 02/20/2021   10:40 AM 03/10/2017   11:21 AM 03/10/2016   11:05 AM  Montreal Cognitive Assessment   Visuospatial/ Executive (0/5) -- 5 4 5 3   Naming (0/3)  3 3 3 3   Attention: Read list of digits (0/2)  2 2 2 2   Attention: Read list of letters (0/1)  1 1 1 1   Attention: Serial 7 subtraction starting at 100 (0/3)  3 3 2 3   Language: Repeat phrase (0/2)  2 0 1 1  Language : Fluency (0/1)  1 1 1  0  Abstraction (0/2)  2 2 2 2   Delayed Recall (0/5)  4 5 5 3   Orientation (0/6)  6 6 6 6   Total  29 27 28 24   Adjusted Score (based on education)     25      11/16/2022    1:23 PM 11/13/2021   11:27 AM 09/27/2017   10:10 AM  6CIT Screen  What Year? 0 points 0 points 0 points  What month? 0 points 0 points 0 points  What time? 0 points 0 points 0 points  Count back from 20 0 points 0 points 0 points  Months in reverse 0 points 0 points 0 points  Repeat phrase 2 points 0 points 6 points  Total Score 2 points 0 points 6 points    Immunizations Immunization History  Administered Date(s) Administered   PFIZER(Purple Top)SARS-COV-2 Vaccination 01/07/2020, 02/07/2020,  11/06/2020   Pfizer(Comirnaty)Fall Seasonal Vaccine 12 years and older 11/29/2022   Pneumococcal Conjugate-13 09/22/2016   Pneumococcal Polysaccharide-23 07/27/2012   Td 11/03/2007   Tdap 11/12/2020   Zoster, Live 10/02/2015    TDAP status: Up to date  Flu Vaccine status: Due, Education has been provided regarding the importance of this vaccine. Advised may receive this vaccine at local pharmacy or Health Dept. Aware to provide a copy of the vaccination record if obtained from local pharmacy or Health Dept. Verbalized acceptance and understanding.  Pneumococcal vaccine status: Up to date  Covid-19 vaccine status: Information provided on how to obtain vaccines.   Qualifies for Shingles Vaccine? Yes   Zostavax completed No   Shingrix Completed?: No.    Education has been provided regarding the importance of this vaccine. Patient has been advised to call insurance company to determine out of pocket expense if they have not yet received this vaccine. Advised may also receive vaccine at local pharmacy or Health Dept. Verbalized acceptance and understanding.  Screening Tests Health Maintenance  Topic Date Due   COVID-19 Vaccine (5 - 2024-25 season) 07/04/2023   Zoster Vaccines- Shingrix (1 of 2) 03/18/2024 (Originally 06/02/1960)   INFLUENZA VACCINE  09/28/2027 (Originally 06/03/2023)   Medicare Annual Wellness (AWV)  11/22/2024   DTaP/Tdap/Td (3 - Td or Tdap) 11/12/2030   Pneumonia Vaccine 40+ Years old  Completed   DEXA SCAN  Completed   HPV VACCINES  Aged Out    Health Maintenance  Health Maintenance Due  Topic Date Due   COVID-19 Vaccine (5 - 2024-25 season) 07/04/2023    Colorectal cancer screening: Type of screening: Colonoscopy. Completed 06/30/22. Repeat every as directed  years  Mammogram status: Completed 11/16/23. Repeat every year  Bone Density status: Completed 04/16/22. Results reflect: Bone density results: OSTEOPOROSIS. Repeat every 2 years.   Additional  Screening:  Vision Screening: Recommended annual ophthalmology exams for early detection of glaucoma and other disorders of the eye. Is the patient up to date with their annual eye exam?  Yes  Who is the provider or what is the name of the office in which the patient attends annual eye exams? [facility]  If pt is not established with a provider, would they like to be referred to a provider to establish care? No .   Dental Screening: Recommended annual dental exams for proper oral hygiene   Community Resource Referral / Chronic Care Management: CRR required this visit?  No   CCM required this visit?  No     Plan:     I have personally reviewed and noted the following in the patient's chart:   Medical and social history Use of alcohol, tobacco or illicit drugs  Current medications and supplements including opioid prescriptions. Patient is not currently taking opioid prescriptions. Functional ability and status Nutritional status Physical activity Advanced directives List of other physicians Hospitalizations, surgeries, and ER visits in previous 12 months Vitals Screenings to include cognitive, depression, and falls Referrals and appointments  In addition, I have reviewed and discussed with patient certain preventive protocols, quality metrics, and best practice recommendations. A written personalized care plan for preventive services as well as general preventive health recommendations were provided to patient.     Marzella Schlein, LPN   [id number]/61/0960   After Visit Summary: (MyChart) Due to this being a telephonic visit, the after visit summary with patients personalized plan was offered to patient via MyChart   Nurse Notes: Pt prefers to complete full exam with a neurologist declined cognition at this time

## 2023-11-23 NOTE — Progress Notes (Deleted)
Subjective:   Nicole Holt is a 83 y.o. female who presents for Medicare Annual (Subsequent) preventive examination.  Visit Complete: In person  .  Cardiac Risk Factors include: advanced age (>32men, >82 women);dyslipidemia;obesity (BMI >30kg/m2)     Objective:    Today's Vitals   11/23/23 1258  BP: 118/64  Pulse: 64  Temp: 98 F (36.7 C)  SpO2: 94%  Weight: 176 lb (79.8 kg)   Body mass index is 31.18 kg/m.     11/23/2023    1:07 PM 11/16/2022    1:19 PM 11/13/2021   11:20 AM 11/07/2020   11:21 AM 09/12/2020   12:28 PM 10/23/2019    1:22 PM 10/14/2018   10:17 AM  Advanced Directives  Does Patient Have a Medical Advance Directive? Yes Yes Yes Yes Yes Yes Yes  Type of Estate agent of [place];Living will Healthcare Power of [place];Living will Healthcare Power of [employer] of [place];Living will Living will Living will;Healthcare Power of [employer] Power of [place];Living will  Does patient want to make changes to medical advance directive? No - Patient declined No - Patient declined    No - Patient declined No - Patient declined  Copy of Healthcare Power of Attorney in Chart? Yes - validated most recent copy scanned in chart (See row information) Yes - validated most recent copy scanned in chart (See row information) Yes - validated most recent copy scanned in chart (See row information) Yes - validated most recent copy scanned in chart (See row information)  Yes - validated most recent copy scanned in chart (See row information) No - copy requested    Current Medications (verified) Outpatient Encounter Medications as of 11/23/2023  Medication Sig   diazepam (VALIUM) 5 MG tablet TAKE 1 TABLET BY MOUTH EVERY 6 HOURS AS NEEDED FOR ANXIETY   fluticasone (FLONASE) 50 MCG/ACT nasal spray Use 1 spray(s) in each nostril once daily   latanoprost (XALATAN) 0.005 % ophthalmic solution    polyethylene glycol (MIRALAX / GLYCOLAX) 17 g  packet Take 17 g by mouth daily as needed.   [DISCONTINUED] melatonin 3 MG TABS tablet Take 3 mg by mouth at bedtime as needed (sleep).   No facility-administered encounter medications on file as of 11/23/2023.    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    Osteoporosis    Sleep apnea    Tinnitus    Past Surgical History:  Procedure Laterality Date   AIR/FLUID EXCHANGE Right 09/12/2020   Procedure: AIR/FLUID EXCHANGE;  Surgeon: Rennis Chris, MD;  Location[facility] OR;  Service: Ophthalmology;  Laterality: Right;   CATARACT EXTRACTION Bilateral OD: 10.25.21 OS: 12.2015   Dr. Harlon Flor   cataract surgery     COSMETIC SURGERY     EYE SURGERY Bilateral    Cat Sx - Dr. Harlon Flor   EYE SURGERY Right 09/12/2020   PPV/EL for retained lens material -  Dr. Rennis Chris   PARS PLANA VITRECTOMY Right 09/12/2020   Procedure: PARS PLANA VITRECTOMY WITH 23 GAUGE;  Surgeon: Rennis Chris, MD;  Location[facility] OR;  Service: Ophthalmology;  Laterality: Right;   PHOTOCOAGULATION WITH LASER Right 09/12/2020   Procedure: PHOTOCOAGULATION WITH LASER;  Surgeon: Rennis Chris, MD;  Location: [facility] OR;  Service: Ophthalmology;  Laterality: Right;   REMOVAL RETAINED LENS Right 09/12/2020   Procedure: REMOVAL RETAINED LENS;  Surgeon: Rennis Chris, MD;  Location: [facility] OR;  Service: Ophthalmology;  Laterality: Right;   Family History  Problem Relation Age of Onset   Osteoporosis Mother    Stroke Mother    Hyperlipidemia Mother    Hypertension Mother    Kidney  disease Father    Congestive Heart Failure Father    Heart disease Father    Hyperlipidemia Father    Stroke Sister    Parkinson's disease Sister    Heart disease Sister    Heart disease Maternal Grandmother    Gout Maternal Grandmother    Stroke Maternal Grandfather    Stroke Paternal Grandmother    Thrombosis Paternal Grandfather    Early death Paternal Grandfather    Hypertension Brother    Diabetes Brother    Arthritis/Rheumatoid Other    Parkinson's disease Other    Heart disease Other    Obesity Daughter    Breast cancer Neg Hx    Social History   Socioeconomic History   Marital status: Married    Spouse name: Casimiro Needle   Number of children: 2   Years of education: 16   Highest education level: Some college, no degree  Occupational History   Occupation: Retired  Tobacco Use   Smoking status: Former   Smokeless tobacco: Never   Tobacco comments:    Quit in 1987  Vaping Use   Vaping status: Never Used  Substance and Sexual Activity   Alcohol use: Yes    Alcohol/week: 12.0 standard drinks of alcohol    Types: 12 Glasses of wine per week    Comment: wine or alcohol daily   Drug use: No   Sexual activity: Yes    Birth control/protection: Post-menopausal  Other Topics Concern   Not on file  Social History Narrative   Patient is married Casimiro Needle) and lives with her husband.   Patient is retired.   Patient has two children.   Patient is right-handed.   Patient has a college education.   Patient drinks two cups of coffee daily.            Social Drivers of Corporate investment banker Strain: Low Risk  (11/23/2023)   Overall Financial Resource Strain (CARDIA)    Difficulty of Paying Living Expenses: Not hard at all  Food Insecurity: No Food Insecurity (11/23/2023)   Hunger Vital Sign    Worried About Running Out of Food in the Last Year: Never true    Ran Out of Food in the Last Year: Never true  Transportation Needs: No Transportation Needs (11/23/2023)    PRAPARE - Administrator, Civil Service (Medical): No    Lack of Transportation (Non-Medical): No  Physical Activity: Insufficiently Active (11/23/2023)   Exercise Vital Sign    Days of Exercise per Week: 2 days    Minutes of Exercise per Session: 60 min  Stress: Stress Concern Present (11/23/2023)   [employer] of Occupational Health - Occupational Stress Questionnaire    Feeling of Stress : To some extent  Social Connections:  Moderately Isolated (11/23/2023)   Social Connection and Isolation Panel [NHANES]    Frequency of Communication with Friends and Family: Once a week    Frequency of Social Gatherings with Friends and Family: More than three times a week    Attends Religious Services: Never    Database administrator or Organizations: No    Attends Engineer, structural: Never    Marital Status: Married    Tobacco Counseling Counseling given: Not Answered Tobacco comments: Quit in 1987   Clinical Intake:  Pre-visit preparation completed: Yes  Pain : No/denies pain     BMI - recorded: 31.18 Nutritional Status: BMI > 30  Obese Nutritional Risks: None Diabetes: No  How often do you need to have someone help you when you read instructions, pamphlets, or other written materials from your doctor or pharmacy?: 1 - Never  Interpreter Needed?: No  Information entered by :: Lanier Ensign, LPN   Activities of Daily Living    11/23/2023    1:03 PM  In your present state of health, do you have any difficulty performing the following activities:  Hearing? 1  Comment hearing aids  Vision? 0  Difficulty concentrating or making decisions? 0  Walking or climbing stairs? 0  Dressing or bathing? 1  Comment at times reaching feet  Doing errands, shopping? 0  Preparing Food and eating ? N  Using the Toilet? N  In the past six months, have you accidently leaked urine? Y  Comment at times  Do you have problems with loss of bowel control? N  Managing  your Medications? N  Managing your Finances? N  Housekeeping or managing your Housekeeping? N    Patient Care Team: Jarold Motto, [place] as PCP - General (Physician Assistant) Daphine Deutscher [address], NP as Nurse Practitioner (Neurology) Chalmers Guest, MD as Consulting Physician (Ophthalmology) Dohmeier, Porfirio Mylar, MD as Consulting Physician (Neurology) Dentistry, [facility]le Guard, [place], [facility] (Inactive) as Pharmacist (Pharmacist)  Indicate any recent Medical Services you may have received from other than [facility] providers in the past year (date may be approximate).     Assessment:   This is a routine wellness examination for Nicholl.  Hearing/Vision screen Hearing Screening - Comments:: Pt has hearing aids  Vision Screening - Comments:: Pt [facility] for [facility] eye exams    Goals Addressed             This Visit's Progress    Patient Stated       Lose weight        Depression Screen    11/23/2023    1:06 PM 11/24/2022    1:13 PM 11/16/2022    1:17 PM 05/06/2022   11:03 AM 11/13/2021   11:13 AM 06/20/2021   10:51 AM 11/07/2020   11:13 AM  PHQ 2/9 Scores  PHQ - 2 Score 2 0 1 0 1  3  PHQ- 9 Score 4    2  3   Exception Documentation      Patient refusal     Fall Risk    11/23/2023    1:11 PM 11/24/2022    1:13 PM 11/16/2022    1:20 PM 05/06/2022   11:03 AM 11/13/2021   11:21 AM  Fall Risk   Falls in the past year? 1 0 0 0 0  Number falls in past yr: 1 0 0 0 0  Injury with Fall? 0 0 0 0 0  Risk for fall due to : History of  fall(s)   No Fall Risks   Follow up Falls prevention discussed Falls evaluation completed Falls prevention discussed  Falls prevention discussed    MEDICARE RISK AT HOME: Medicare Risk at Home Any stairs in or around the home?: Yes If so, are there any without handrails?: No Home free of loose throw rugs in walkways, pet beds, electrical cords, etc?: Yes Adequate lighting in your home to reduce risk of falls?: Yes Life alert?:  No Use of a cane, walker or w/c?: No Grab bars in the bathroom?: Yes Shower chair or bench in shower?: No Elevated toilet seat or a handicapped toilet?: No  TIMED UP AND GO:  Was the test performed?  Yes  Length of time to ambulate 10 feet: 10 sec Gait steady and fast without use of assistive device    Cognitive Function:    11/23/2023    1:13 PM 10/14/2018   10:32 AM  MMSE - Mini Mental State Exam  Not completed: Refused   Orientation to time  5  Orientation to Place  5  Registration  3  Attention/ Calculation  5  Recall  3  Language- name 2 objects  2  Language- repeat  1  Language- follow 3 step command  3  Language- read & follow direction  1  Write a sentence  1  Copy design  1  Total score  30      11/30/2022   10:26 AM 11/27/2021   10:22 AM 02/20/2021   10:40 AM 03/10/2017   11:21 AM 03/10/2016   11:05 AM  Montreal Cognitive Assessment   Visuospatial/ Executive (0/5) -- 5 4 5 3   Naming (0/3)  3 3 3 3   Attention: Read list of digits (0/2)  2 2 2 2   Attention: Read list of letters (0/1)  1 1 1 1   Attention: Serial 7 subtraction starting at 100 (0/3)  3 3 2 3   Language: Repeat phrase (0/2)  2 0 1 1  Language : Fluency (0/1)  1 1 1  0  Abstraction (0/2)  2 2 2 2   Delayed Recall (0/5)  4 5 5 3   Orientation (0/6)  6 6 6 6   Total  29 27 28 24   Adjusted Score (based on education)     25      11/16/2022    1:23 PM 11/13/2021   11:27 AM 09/27/2017   10:10 AM  6CIT Screen  What Year? 0 points 0 points 0 points  What month? 0 points 0 points 0 points  What time? 0 points 0 points 0 points  Count back from 20 0 points 0 points 0 points  Months in reverse 0 points 0 points 0 points  Repeat phrase 2 points 0 points 6 points  Total Score 2 points 0 points 6 points    Immunizations Immunization History  Administered Date(s) Administered   PFIZER(Purple Top)SARS-COV-2 Vaccination 01/07/2020, 02/07/2020, 11/06/2020   Pfizer(Comirnaty)Fall Seasonal Vaccine 12 years and  older 11/29/2022   Pneumococcal Conjugate-13 09/22/2016   Pneumococcal Polysaccharide-23 07/27/2012   Td 11/03/2007   Tdap 11/12/2020   Zoster, Live 10/02/2015    TDAP status: Up to date  Flu Vaccine status: Due, Education has been provided regarding the importance of this vaccine. Advised may receive this vaccine at local pharmacy or Health Dept. Aware to provide a copy of the vaccination record if obtained from local pharmacy or Health Dept. Verbalized acceptance and understanding.  Pneumococcal vaccine status: Up to date  Covid-19 vaccine status:  Information provided on how to obtain vaccines.   Qualifies for Shingles Vaccine? Yes   Zostavax completed No   Shingrix Completed?: No.    Education has been provided regarding the importance of this vaccine. Patient has been advised to call insurance company to determine out of pocket expense if they have not yet received this vaccine. Advised may also receive vaccine at local pharmacy or Health Dept. Verbalized acceptance and understanding.  Screening Tests Health Maintenance  Topic Date Due   COVID-19 Vaccine (5 - 2024-25 season) 07/04/2023   Zoster Vaccines- Shingrix (1 of 2) 03/18/2024 (Originally 06/02/1960)   INFLUENZA VACCINE  09/28/2027 (Originally 06/03/2023)   Medicare Annual Wellness (AWV)  11/22/2024   DTaP/Tdap/Td (3 - Td or Tdap) 11/12/2030   Pneumonia Vaccine 6+ Years old  Completed   DEXA SCAN  Completed   HPV VACCINES  Aged Out    Health Maintenance  Health Maintenance Due  Topic Date Due   COVID-19 Vaccine (5 - 2024-25 season) 07/04/2023    Colorectal cancer screening: Type of screening: Colonoscopy. Completed 06/30/22. Repeat every as directed  years  Mammogram status: Completed 11/16/23. Repeat every year  Bone Density status: Completed 04/16/22. Results reflect: Bone density results: OSTEOPOROSIS. Repeat every 2 years.   Additional Screening:  Vision Screening: Recommended annual ophthalmology exams for  early detection of glaucoma and other disorders of the eye. Is the patient up to date with their annual eye exam?  Yes  Who is the provider or what is the name of the office in which the patient attends annual eye exams? [facility]  If pt is not established with a provider, would they like to be referred to a provider to establish care? No .   Dental Screening: Recommended annual dental exams for proper oral hygiene   Community Resource Referral / Chronic Care Management: CRR required this visit?  No   CCM required this visit?  No     Plan:     I have personally reviewed and noted the following in the patient's chart:   Medical and social history Use of alcohol, tobacco or illicit drugs  Current medications and supplements including opioid prescriptions. Patient is not currently taking opioid prescriptions. Functional ability and status Nutritional status Physical activity Advanced directives List of other physicians Hospitalizations, surgeries, and ER visits in previous 12 months Vitals Screenings to include cognitive, depression, and falls Referrals and appointments  In addition, I have reviewed and discussed with patient certain preventive protocols, quality metrics, and best practice recommendations. A written personalized care plan for preventive services as well as general preventive health recommendations were provided to patient.     Marzella Schlein, LPN   [id number]   After Visit Summary: (MyChart) Due to this being a telephonic visit, the after visit summary with patients personalized plan was offered to patient via MyChart   Nurse Notes: none

## 2023-11-24 ENCOUNTER — Encounter: Payer: Self-pay | Admitting: Physician Assistant

## 2023-11-24 ENCOUNTER — Ambulatory Visit: Payer: Medicare HMO | Admitting: Physician Assistant

## 2023-11-24 VITALS — BP 130/70 | HR 68 | Temp 97.6°F | Ht 63.0 in | Wt 177.0 lb

## 2023-11-24 DIAGNOSIS — F419 Anxiety disorder, unspecified: Secondary | ICD-10-CM

## 2023-11-24 DIAGNOSIS — M810 Age-related osteoporosis without current pathological fracture: Secondary | ICD-10-CM | POA: Diagnosis not present

## 2023-11-24 DIAGNOSIS — K5900 Constipation, unspecified: Secondary | ICD-10-CM | POA: Diagnosis not present

## 2023-11-24 DIAGNOSIS — E669 Obesity, unspecified: Secondary | ICD-10-CM

## 2023-11-24 DIAGNOSIS — Z6831 Body mass index (BMI) 31.0-31.9, adult: Secondary | ICD-10-CM

## 2023-11-24 DIAGNOSIS — R21 Rash and other nonspecific skin eruption: Secondary | ICD-10-CM | POA: Diagnosis not present

## 2023-11-24 DIAGNOSIS — E785 Hyperlipidemia, unspecified: Secondary | ICD-10-CM

## 2023-11-24 DIAGNOSIS — Z0000 Encounter for general adult medical examination without abnormal findings: Secondary | ICD-10-CM

## 2023-11-24 LAB — COMPREHENSIVE METABOLIC PANEL
ALT: 18 U/L (ref 0–35)
AST: 21 U/L (ref 0–37)
Albumin: 4.5 g/dL (ref 3.5–5.2)
Alkaline Phosphatase: 59 U/L (ref 39–117)
BUN: 14 mg/dL (ref 6–23)
CO2: 29 meq/L (ref 19–32)
Calcium: 9.7 mg/dL (ref 8.4–10.5)
Chloride: 101 meq/L (ref 96–112)
Creatinine, Ser: 0.92 mg/dL (ref 0.40–1.20)
GFR: 58.41 mL/min — ABNORMAL LOW (ref >60.00)
Glucose, Bld: 103 mg/dL — ABNORMAL HIGH (ref 70–99)
Potassium: 4.2 meq/L (ref 3.5–5.1)
Sodium: 138 meq/L (ref 135–145)
Total Bilirubin: 0.6 mg/dL (ref 0.2–1.2)
Total Protein: 7.5 g/dL (ref 6.0–8.3)

## 2023-11-24 LAB — CBC WITH DIFFERENTIAL/PLATELET
Basophils Absolute: 0 10*3/uL (ref 0.0–0.1)
Basophils Relative: 1 % (ref 0.0–3.0)
Eosinophils Absolute: 0.1 10*3/uL (ref 0.0–0.7)
Eosinophils Relative: 1.6 % (ref 0.0–5.0)
HCT: 43.7 % (ref 36.0–46.0)
Hemoglobin: 14.8 g/dL (ref 12.0–15.0)
Lymphocytes Relative: 36.1 % (ref 12.0–46.0)
Lymphs Abs: 1.8 10*3/uL (ref 0.7–4.0)
MCHC: 33.8 g/dL (ref 30.0–36.0)
MCV: 93.6 fL (ref 78.0–100.0)
Monocytes Absolute: 0.4 10*3/uL (ref 0.1–1.0)
Monocytes Relative: 7.1 % (ref 3.0–12.0)
Neutro Abs: 2.7 10*3/uL (ref 1.4–7.7)
Neutrophils Relative %: 54.2 % (ref 43.0–77.0)
Platelets: 249 10*3/uL (ref 150.0–400.0)
RBC: 4.67 Mil/uL (ref 3.87–5.11)
RDW: 13.1 % (ref 11.5–15.5)
WBC: 5 10*3/uL (ref 4.0–10.5)

## 2023-11-24 LAB — URINALYSIS, ROUTINE W REFLEX MICROSCOPIC
Bilirubin Urine: NEGATIVE
Hgb urine dipstick: NEGATIVE
Ketones, ur: NEGATIVE
Nitrite: NEGATIVE
RBC / HPF: NONE SEEN (ref 0–?)
Specific Gravity, Urine: 1.015 (ref 1.000–1.030)
Total Protein, Urine: NEGATIVE
Urine Glucose: NEGATIVE
Urobilinogen, UA: 0.2 (ref 0.0–1.0)
pH: 6 (ref 5.0–8.0)

## 2023-11-24 LAB — LIPID PANEL
Cholesterol: 174 mg/dL (ref 0–200)
HDL: 47.1 mg/dL (ref >39.00)
LDL Cholesterol: 89 mg/dL (ref 0–99)
NonHDL: 126.41
Total CHOL/HDL Ratio: 4
Triglycerides: 187 mg/dL — ABNORMAL HIGH (ref 0.0–149.0)
VLDL: 37.4 mg/dL (ref 0.0–40.0)

## 2023-11-24 MED ORDER — TRIAMCINOLONE ACETONIDE 0.1 % EX CREA: TOPICAL_CREAM | CUTANEOUS | 0 refills | Status: DC

## 2023-11-24 NOTE — Patient Instructions (Signed)
It was great to see you!  Please call [facility] gastroenterology and try to get back in with them regarding your new bowel concerns.  I will be in touch with your blood work results.  I'm extremely proud of your progress with your alcohol!  Take care,  Jarold Motto PA-C

## 2023-11-24 NOTE — Progress Notes (Signed)
Subjective:    Nicole Holt is a 83 y.o. female and is here for a comprehensive physical exam.   There are no preventive care reminders to display for this patient.  Acute Concerns: Constipation She complains today of constipation. She can go up to 3 days without having a BM.  Accompanying symptoms include bloating. Her symptoms have persisted for the past 2 weeks. She typically uses Miralax, prune juice, and enemas to help relief her symptoms.  She's also been sitting in hot water bath to induce a BM. She states that her stool was softer and thinner when she's using her water bath.  She also reports experiencing several episodes of fecal incontinence when urinating. She's been experiencing fecal incontinence longer than 2 weeks.   She states that her previous bleeding has resolved and colonoscopy on 06-30-22 indicated diverticulosis and hemorrhoids.   Chronic Issues: Rash She states that she noticed several spots on her left-sided abdomen. She states that these are the residuals of her previous shingles infection.  She's currently experiencing itchiness around these spots.  No other complains at this time.   GAD Reports compliance and good tolerance of valium 2.5 mg (1/2 tablet) most days. No other complains or symptoms at this time.   Osteoporosis Not currently on any medication Last DEXA was 04/16/22 She denies taking calcium or vitamin D. No complains at this time.   Health Maintenance: Immunizations -- N/A Colonoscopy -- last done on 06-30-22. Diverticulosis in the sigmoid colon and hemorrhoids Mammogram -- Last done on 11-16-23. Normal  PAP -- N/A Bone Density -- Last done on 04-16-22. Osteoporosis Diet -- typical balanced diet Exercise -- no exercise   Sleep habits -- no complains Mood -- stable   UTD with dentist? - yes UTD with eye doctor? - yes  Weight history: Wt Readings from Last 10 Encounters:  11/24/23 177 lb (80.3 kg)  11/23/23 176 lb (79.8 kg)   03/19/23 177 lb (80.3 kg)  11/30/22 177 lb (80.3 kg)  11/24/22 178 lb 8 oz (81 kg)  11/16/22 178 lb 3.2 oz (80.8 kg)  05/06/22 176 lb 3.2 oz (79.9 kg)  04/16/22 176 lb 6.4 oz (80 kg)  11/27/21 176 lb (79.8 kg)  11/14/21 176 lb 3.2 oz (79.9 kg)   Body mass index is 31.35 kg/m. No LMP recorded. Patient is postmenopausal.  Alcohol use:  reports current alcohol use of about 12.0 standard drinks of alcohol per week.  Tobacco use:  Tobacco Use: Medium Risk (11/24/2023)   Patient History    Smoking Tobacco Use: Former    Smokeless Tobacco Use: Never    Passive Exposure: Not on file   Eligible for lung cancer screening? no     11/23/2023    1:06 PM  Depression screen PHQ 2/9  Decreased Interest 1  Down, Depressed, Hopeless 1  PHQ - 2 Score 2  Altered sleeping 0  Tired, decreased energy 1  Change in appetite 0  Feeling bad or failure about yourself  0  Trouble concentrating 1  Moving slowly or fidgety/restless 0  Suicidal thoughts 0  PHQ-9 Score 4  Difficult doing work/chores Not difficult at all     Other providers/specialists: Patient Care Team: Jarold Motto, [place] as PCP - General (Physician Assistant) Daphine Deutscher, Dale Robbins, NP as Nurse Practitioner (Neurology) Chalmers Guest, MD as Consulting Physician (Ophthalmology) Dohmeier, Porfirio Mylar, MD as Consulting Physician (Neurology) Dentistry, [facility] Dahlia Byes, [facility] (Inactive) as Pharmacist (Pharmacist)    PMHx, SurgHx, SocialHx, Medications,  and Allergies were reviewed in the Visit Navigator and updated as appropriate.   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    Osteoporosis    Sleep apnea    Tinnitus      Past Surgical History:  Procedure Laterality Date   AIR/FLUID EXCHANGE Right 09/12/2020   Procedure: AIR/FLUID EXCHANGE;  Surgeon: Rennis Chris, MD;  Location: [facility] OR;  Service: Ophthalmology;  Laterality: Right;   CATARACT EXTRACTION Bilateral OD: 10.25.21 OS: 12.2015   Dr. Harlon Flor   cataract surgery     COSMETIC SURGERY     EYE SURGERY Bilateral    Cat Sx - Dr. Harlon Flor   EYE SURGERY Right 09/12/2020   PPV/EL for retained lens material - Dr. Rennis Chris   PARS PLANA VITRECTOMY Right 09/12/2020   Procedure: PARS PLANA VITRECTOMY WITH 23 GAUGE;  Surgeon: Rennis Chris, MD;  Location: [facility] OR;  Service: Ophthalmology;  Laterality: Right;   PHOTOCOAGULATION WITH LASER Right 09/12/2020   Procedure: PHOTOCOAGULATION WITH LASER;  Surgeon: Rennis Chris, MD;  Location: [facility] OR;  Service: Ophthalmology;  Laterality: Right;   REMOVAL RETAINED LENS Right 09/12/2020   Procedure: REMOVAL RETAINED LENS;  Surgeon: Rennis Chris, MD;  Location: [facility] OR;  Service: Ophthalmology;  Laterality: Right;     Family History  Problem Relation Age of Onset   Osteoporosis Mother    Stroke Mother    Hyperlipidemia Mother    Hypertension Mother    Kidney disease Father    Congestive Heart Failure Father    Heart disease Father    Hyperlipidemia Father    Stroke Sister    Parkinson's disease Sister    Heart disease Sister    Heart disease Maternal Grandmother    Gout Maternal Grandmother    Stroke Maternal Grandfather    Stroke Paternal Grandmother    Thrombosis Paternal Grandfather    Early death Paternal Grandfather    Hypertension Brother    Diabetes Brother    Arthritis/Rheumatoid Other    Parkinson's disease Other    Heart disease Other    Obesity Daughter    Breast cancer Neg Hx     Social History   Tobacco Use   Smoking status: Former   Smokeless tobacco: Never   Tobacco comments:    Quit in 1987   Vaping Use   Vaping status: Never Used  Substance Use Topics   Alcohol use: Yes    Alcohol/week: 12.0 standard drinks of alcohol    Types: 12 Glasses of wine per week    Comment: wine or alcohol daily   Drug use: No    Review of Systems:   Review of Systems  Constitutional:  Negative for chills, fever, malaise/fatigue and weight loss.  HENT:  Negative for hearing loss, sinus pain and sore throat.   Respiratory:  Negative for cough and hemoptysis.   Cardiovascular:  Negative for chest pain, palpitations, leg swelling and PND.  Gastrointestinal:  Positive for constipation. Negative for abdominal  pain, diarrhea, heartburn, nausea and vomiting.  Genitourinary:  Negative for dysuria, frequency and urgency.  Musculoskeletal:  Negative for back pain, myalgias and neck pain.  Skin:  Positive for itching. Negative for rash.  Neurological:  Negative for dizziness, tingling, seizures and headaches.  Endo/Heme/Allergies:  Negative for polydipsia.  Psychiatric/Behavioral:  Negative for depression. The patient is not nervous/anxious.     Objective:   BP 130/70 (BP Location: Left Arm, Patient Position: Sitting, Cuff Size: Large)   Pulse 68   Temp 97.6 F (36.4 C) (Temporal)   Ht 5\' 3"  (1.6 m)   Wt 177 lb (80.3 kg)   SpO2 98%   BMI 31.35 kg/m  Body mass index is 31.35 kg/m.   General Appearance:    Alert, cooperative, no distress, appears stated age  Head:    Normocephalic, without obvious abnormality, atraumatic  Eyes:    PERRL, conjunctiva/corneas clear, EOM's intact, fundi    benign, both eyes  Ears:    Normal TM's and external ear canals, both ears  Nose:   Nares normal, septum midline, mucosa normal, no drainage    or sinus tenderness  Throat:   Lips, mucosa, and tongue normal; teeth and gums normal  Neck:   Supple, symmetrical, trachea midline, no adenopathy;    thyroid:  no enlargement/tenderness/nodules; no carotid   bruit or JVD  Back:     Symmetric, no curvature, ROM  normal, no CVA tenderness  Lungs:     Clear to auscultation bilaterally, respirations unlabored  Chest Wall:    No tenderness or deformity   Heart:    Regular rate and rhythm, S1 and S2 normal, no murmur, rub or gallop  Breast Exam:    Deferred   Abdomen:     Soft, non-tender, bowel sounds active all four quadrants,    no masses, no organomegaly  Genitalia:    Deferred   Extremities:   Extremities normal, atraumatic, no cyanosis or edema  Pulses:   2+ and symmetric all extremities  Skin:   Skin color, texture, turgor normal, no rashes or lesions  Lymph nodes:   Cervical, supraclavicular, and axillary nodes normal  Neurologic:   CNII-XII intact, normal strength, sensation and reflexes    throughout    Assessment/Plan:   Routine physical examination Today patient counseled on age appropriate routine health concerns for screening and prevention, each reviewed and up to date or declined. Immunizations reviewed and up to date or declined. Labs ordered and reviewed. Risk factors for depression reviewed and negative. Hearing function and visual acuity are intact. ADLs screened and addressed as needed. Functional ability and level of safety reviewed and appropriate. Education, counseling and referrals performed based on assessed risks today. Patient provided with a copy of personalized plan for preventive services.  Age-related osteoporosis without current pathological fracture Declines medication Remains UpToDate on DEXA Continue efforts at maintaining adequate exercise, calcium and vitamin D intake  Anxiety Overall stable Continue as needed valium 2.5 mg daily Denies suicidal ideation/hi   Dyslipidemia Update lipid panel and make recommendations  Obesity, unspecified class, unspecified obesity type, unspecified whether serious comorbidity present Continue efforts at healthy lifestyle  Constipation, unspecified constipation type Recommend that she reach out to [facility] gastroenterology  to follow-up with this  Rash No visible rash on my exam Topical triamcinolone to area as needed If no improvement, recommend close follow-up    Jarold Motto, PA-C Marianne Horse [facility] M Kadhim,acting as a Neurosurgeon for [employer], PA.,have documented  all relevant documentation on the behalf of Jarold Motto, PA,as directed by  Jarold Motto, PA while in the presence of Jarold Motto, [place].   I, Jarold Motto, [place], have reviewed all documentation for this visit. The documentation on 11/24/23 for the exam, diagnosis, procedures, and orders are all accurate and complete.

## 2023-11-26 ENCOUNTER — Other Ambulatory Visit: Payer: Medicare HMO

## 2023-11-26 ENCOUNTER — Other Ambulatory Visit: Payer: Self-pay | Admitting: Physician Assistant

## 2023-11-26 DIAGNOSIS — R82998 Other abnormal findings in urine: Secondary | ICD-10-CM

## 2023-11-27 LAB — URINE CULTURE
MICRO NUMBER:: 15994689
Result:: NO GROWTH
SPECIMEN QUALITY:: ADEQUATE

## 2023-11-28 ENCOUNTER — Encounter: Payer: Self-pay | Admitting: Physician Assistant

## 2023-11-30 ENCOUNTER — Telehealth: Payer: Self-pay | Admitting: *Deleted

## 2023-11-30 NOTE — Telephone Encounter (Signed)
Spoke to pt told her per Lelon Mast, Your urine studies are normal and no further work-up is needed at this time. If you are still having symptoms? Pt verbalized understanding and said no, she is good.

## 2023-11-30 NOTE — Telephone Encounter (Signed)
Copied from CRM [id number]. Topic: Clinical - Lab/Test Results >> Nov 30, 2023  4:02 PM Sonny Dandy B wrote: Reason for CRM:pt called to follow up on lab results please call pt back at [phone]

## 2023-12-02 NOTE — Progress Notes (Signed)
 [facility] [address] [place]. [place] [address] [phone]       OFFICE FOLLOW UP NOTE  Ms. Consuela Denier Date of Birth:  1941-03-17 Medical Record Number:  [id number]    Primary neurologist: Dr. Albertina Hugger Reason for visit: CPAP and memory follow-up    SUBJECTIVE:   CHIEF COMPLAINT:  Chief Complaint  Patient presents with   Follow-up    Pt in room 8 alone. Here for cpap follow up/memory follow up. Pt uses nightly, doing well. Pt said she forget things at times.     Follow-up visit:  Prior visit: 11/30/2022 Dr. Albertina Hugger  Brief HPI:   PAGE DANIELSKI is a 83 y.o. female who is followed for OSA on CPAP.  Last sleep study 09/2012 showed AHI of 9.2 and RDI of 18 with AHI accentuated during supine sleep and REM sleep.  Received new machine 10/2023. She also had memory complaints and delayed word finding, MOCA 29/30 (11/2021).  At prior visit with Dr. Albertina Hugger, compliance report showed excellent usage and optimal residual AHI.  Continued current pressure settings of 5-8 with EPR 2. Refused MOCA testing, Dr. Albertina Hugger felt memory concerns related to hearing loss, also mentioned memory lapses with increased depression/anxiety and has a history of a lot of head trauma (falls, accidents)     Interval history:  Returns today for yearly follow-up visit.  CPAP compliance report as below showing excellent usage and optimal residual AHI.  Continues to tolerate CPAP well. ESS 8/24. Reports insurance is not covering CPAP supplies, has been paying out of pocket.   Believes her memory has gradually declined. MOCA today 28/30.  She has been working on lifestyle changes. She hasn't used valium  EtOH over the past 2 months, previously taking 3 shots of vodka nightly in addition to Valium . She has been trying to be active and working out at a gym. She does have chronic joint pain. Also notes occasional word finding difficulty, more of a delayed finding.         ROS:   14  system review of systems performed and negative with exception of those listed in HPI  PMH:  Past Medical History:  Diagnosis Date   Allergy    Anxiety    panic attack   Depression    GERD (gastroesophageal reflux disease)    Glaucoma    Glaucoma    Hearing loss    History of basal cell cancer 08/19/2012   Removed from nose   Hyperlipidemia    Hypertensive retinopathy    OU   Migraines    ocular   OSA on CPAP 02/02/2014   PSG 09-27-2012 RDI 18.9 and AHi 9.8, Epworth 15 .titrated to only 5 cm water .    Osteoporosis    Sleep apnea    Tinnitus     PSH:  Past Surgical History:  Procedure Laterality Date   AIR/FLUID EXCHANGE Right 09/12/2020   Procedure: AIR/FLUID EXCHANGE;  Surgeon: Ronelle Coffee, MD;  Location: [facility] OR;  Service: Ophthalmology;  Laterality: Right;   CATARACT EXTRACTION Bilateral OD: 10.25.21 OS: 12.2015   Dr. Gwendalyn Lemma   cataract surgery     COSMETIC SURGERY     EYE SURGERY Bilateral    Cat Sx - Dr. Gwendalyn Lemma   EYE SURGERY Right 09/12/2020   PPV/EL for retained lens material - Dr. Ronelle Coffee   PARS PLANA VITRECTOMY Right 09/12/2020   Procedure: PARS PLANA VITRECTOMY WITH 23 GAUGE;  Surgeon: Ronelle Coffee, MD;  Location: [facility] OR;  Service: Ophthalmology;  Laterality: Right;   PHOTOCOAGULATION WITH LASER Right 09/12/2020   Procedure: PHOTOCOAGULATION WITH LASER;  Surgeon: Ronelle Coffee, MD;  Location: [facility] OR;  Service: Ophthalmology;  Laterality: Right;   REMOVAL RETAINED LENS Right 09/12/2020   Procedure: REMOVAL RETAINED LENS;  Surgeon: Ronelle Coffee, MD;  Location[facility] OR;  Service: Ophthalmology;  Laterality: Right;    Social History:  Social History   Socioeconomic History   Marital status: Married    Spouse name: Bambi Lever   Number of children: 2   Years of education: 16   Highest education level: Some college, no degree  Occupational History   Occupation: Retired  Tobacco Use   Smoking status: Former   Smokeless tobacco: Never   Tobacco comments:     Quit in 1987  Vaping Use   Vaping status: Never Used  Substance and Sexual Activity   Alcohol use: Not Currently    Alcohol/week: 12.0 standard drinks of alcohol    Types: 12 Glasses of wine per week    Comment: wine or alcohol daily   Drug use: No   Sexual activity: Yes    Birth control/protection: Post-menopausal  Other Topics Concern   Not on file  Social History Narrative   Patient is married Bambi Lever) and lives with her husband.Patient is retired.Patient has two children.Patient is right-handed.Patient has a college education.Patient drinks two cups of coffee daily.      Right handed    Drinks 2 cups per day    Social Drivers of Health   Financial Resource Strain: Low Risk  (11/23/2023)   Overall Financial Resource Strain (CARDIA)    Difficulty of Paying Living Expenses: Not hard at all  Food Insecurity: No Food Insecurity (11/23/2023)   Hunger Vital Sign    Worried About Running Out of Food in the Last Year: Never true    Ran Out of Food in the Last Year: Never true  Transportation Needs: No Transportation Needs (11/23/2023)   PRAPARE - Administrator, Civil Service (Medical): No    Lack of Transportation (Non-Medical): No  Physical Activity: Insufficiently Active (11/23/2023)   Exercise Vital Sign    Days of Exercise per Week: 2 days    Minutes of Exercise per Session: 60 min  Stress: Stress Concern Present (11/23/2023)   [employer] of Occupational Health - Occupational Stress Questionnaire    Feeling of Stress : To some extent  Social Connections: Moderately Isolated (11/23/2023)   Social Connection and Isolation Panel [NHANES]    Frequency of Communication with Friends and Family: Once a week    Frequency of Social Gatherings with Friends and Family: More than three times a week    Attends Religious Services: Never    Database administrator or Organizations: No    Attends Banker Meetings: Never    Marital Status: Married  Careers information officer Violence: Not At Risk (11/23/2023)   Humiliation, Afraid, Rape, and Kick questionnaire    Fear of Current or Ex-Partner: No    Emotionally Abused: No    Physically Abused: No    Sexually Abused: No    Family History:  Family History  Problem Relation Age of Onset   Osteoporosis Mother    Stroke Mother    Hyperlipidemia Mother    Hypertension Mother    Kidney disease Father    Congestive Heart Failure Father    Heart disease Father    Hyperlipidemia Father    Stroke Sister  Parkinson's disease Sister    Heart disease Sister    Heart disease Maternal Grandmother    Gout Maternal Grandmother    Stroke Maternal Grandfather    Stroke Paternal Grandmother    Thrombosis Paternal Grandfather    Early death Paternal Grandfather    Hypertension Brother    Diabetes Brother    Arthritis/Rheumatoid Other    Parkinson's disease Other    Heart disease Other    Obesity Daughter    Breast cancer Neg Hx     Medications:   Current Outpatient Medications on File Prior to Visit  Medication Sig Dispense Refill   fluticasone  (FLONASE ) 50 MCG/ACT nasal spray Use 1 spray(s) in each nostril once daily 16 g 0   latanoprost (XALATAN) 0.005 % ophthalmic solution      polyethylene glycol (MIRALAX / GLYCOLAX) 17 g packet Take 17 g by mouth daily as needed.     triamcinolone  cream (KENALOG ) 0.1 % Apply to affected area 1-2 times daily 30 g 0   diazepam  (VALIUM ) 5 MG tablet TAKE 1 TABLET BY MOUTH EVERY 6 HOURS AS NEEDED FOR ANXIETY (Patient not taking: Reported on 12/13/2023) 30 tablet 0   No current facility-administered medications on file prior to visit.    Allergies:   Allergies  Allergen Reactions   Codeine Nausea Only   Estrogens Conjugated Other (See Comments)    HA   Paroxetine Hcl Other (See Comments)    Felt like she was "out of her head"      OBJECTIVE:  Physical Exam  Vitals:   12/13/23 1057  BP: 117/76  Pulse: 66  Weight: 176 lb 12.8 oz (80.2 kg)  Height: 5'  3" (1.6 m)   Body mass index is 31.32 kg/m. No results found.   General: well developed, well nourished, very pleasant elderly Caucasian female, seated, in no evident distress Head: head normocephalic and atraumatic.   Neck: supple with no carotid or supraclavicular bruits Cardiovascular: regular rate and rhythm, no murmurs Musculoskeletal: no deformity Skin:  no rash/petichiae Vascular:  Normal pulses all extremities   Neurologic Exam Mental Status: Awake and fully alert. no evidence of aphasia or dysarthria today. Oriented to place and time. Recent memory subjectively impaired and remote memory intact. Attention span, concentration and fund of knowledge appropriate. Mood and affect appropriate.  Cranial Nerves: Pupils equal, briskly reactive to light. Extraocular movements full without nystagmus. Visual fields full to confrontation. Hearing intact. Facial sensation intact. Face, tongue, palate moves normally and symmetrically.  Motor: Normal bulk and tone. Normal strength in all tested extremity muscles Gait and Station: Arises from chair without difficulty. Stance is normal. Gait demonstrates normal stride length and balance without use of AD. Tandem walk and heel toe without difficulty.      12/13/2023   11:13 AM 11/30/2022   10:26 AM 11/27/2021   10:22 AM 02/20/2021   10:40 AM 03/10/2017   11:21 AM  Montreal Cognitive Assessment   Visuospatial/ Executive (0/5) 3 -- 5 4 5   Naming (0/3) 3  3 3 3   Attention: Read list of digits (0/2) 2  2 2 2   Attention: Read list of letters (0/1) 1  1 1 1   Attention: Serial 7 subtraction starting at 100 (0/3) 3  3 3 2   Language: Repeat phrase (0/2) 2  2 0 1  Language : Fluency (0/1) 1  1 1 1   Abstraction (0/2) 2  2 2 2   Delayed Recall (0/5) 5  4 5 5   Orientation (0/6)  6  6 6 6   Total 28  29 27 28          ASSESSMENT/PLAN: SIENA MUNSTERMAN is a 83 y.o. year old female    OSA on CPAP : Compliance report shows satisfactory usage with optimal  residual AHI.  Continue current pressure settings of 5-8 with EPR 2.  Discussed continued nightly usage with ensuring greater than 4 hours nightly for optimal benefit and per insurance purposes.  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 Subjectively, memory continues to decline and has intermittent delayed word finding difficulty. Recommend completion of MRI brain. Suspect multifactorial in setting of age, ETOH abuse, mood disorder, chronic pain. Discussed importance of limiting ETOH (no ETOH in the past 2 months!). Continue to follow with PCP for mood and pain management. Consider neurocognitive eval in the future.      Follow up in 1 year or call earlier if needed   CC:  PCP: Alexander Iba, PA    I spent 30 minutes of face-to-face and non-face-to-face time with patient.  This included previsit chart review, lab review, study review, order entry, electronic health record documentation, patient education and discussion regarding above diagnoses and treatment plan and answered all other questions to patient's satisfaction  Johny Nap, [facility]  [facility] [address] [place], [place] [address]  Phone [phone] Fax [phone] Note: This document was prepared with digital dictation and possible smart phrase technology. Any transcriptional errors that result from this process are unintentional.

## 2023-12-09 DIAGNOSIS — L814 Other melanin hyperpigmentation: Secondary | ICD-10-CM | POA: Diagnosis not present

## 2023-12-09 DIAGNOSIS — L02223 Furuncle of chest wall: Secondary | ICD-10-CM | POA: Diagnosis not present

## 2023-12-09 DIAGNOSIS — D225 Melanocytic nevi of trunk: Secondary | ICD-10-CM | POA: Diagnosis not present

## 2023-12-09 DIAGNOSIS — L570 Actinic keratosis: Secondary | ICD-10-CM | POA: Diagnosis not present

## 2023-12-09 DIAGNOSIS — I8392 Asymptomatic varicose veins of left lower extremity: Secondary | ICD-10-CM | POA: Diagnosis not present

## 2023-12-09 DIAGNOSIS — Z85828 Personal history of other malignant neoplasm of skin: Secondary | ICD-10-CM | POA: Diagnosis not present

## 2023-12-09 DIAGNOSIS — L821 Other seborrheic keratosis: Secondary | ICD-10-CM | POA: Diagnosis not present

## 2023-12-09 DIAGNOSIS — Z08 Encounter for follow-up examination after completed treatment for malignant neoplasm: Secondary | ICD-10-CM | POA: Diagnosis not present

## 2023-12-13 ENCOUNTER — Encounter: Payer: Self-pay | Admitting: Adult Health

## 2023-12-13 ENCOUNTER — Ambulatory Visit: Payer: Medicare HMO | Admitting: Adult Health

## 2023-12-13 VITALS — BP 117/76 | HR 66 | Ht 63.0 in | Wt 176.8 lb

## 2023-12-13 DIAGNOSIS — G4733 Obstructive sleep apnea (adult) (pediatric): Secondary | ICD-10-CM

## 2023-12-13 DIAGNOSIS — R4789 Other speech disturbances: Secondary | ICD-10-CM

## 2023-12-13 DIAGNOSIS — R413 Other amnesia: Secondary | ICD-10-CM | POA: Diagnosis not present

## 2023-12-13 NOTE — Patient Instructions (Addendum)
 Your Plan:  Continue nightly use of CPAP for adequate sleep apnea management  Continue to follow with your DME company for any needed supplies or CPAP related concerns  You will be called to schedule an MRI brain to look for other causes of memory loss but suspect this is more multifactorial from age, alcohol use, chronic pain, anxiety/depression, etc     Follow up in 1 year or call earlier if needed      Thank you for coming to see us  at [facility]. I hope we have been able to provide you high quality care today.  You may receive a patient satisfaction survey over the next few weeks. We would appreciate your feedback and comments so that we may continue to improve ourselves and the health of our patients.

## 2023-12-22 ENCOUNTER — Encounter: Payer: Self-pay | Admitting: Adult Health

## 2023-12-23 NOTE — Progress Notes (Signed)
 Nicole Holt

## 2023-12-29 ENCOUNTER — Ambulatory Visit
Admission: RE | Admit: 2023-12-29 | Discharge: 2023-12-29 | Disposition: A | Discharge status: Home / Self Care | Payer: Medicare HMO | Source: Ambulatory Visit | Attending: Adult Health | Admitting: Adult Health

## 2023-12-29 DIAGNOSIS — R413 Other amnesia: Secondary | ICD-10-CM | POA: Diagnosis not present

## 2023-12-29 DIAGNOSIS — R4789 Other speech disturbances: Secondary | ICD-10-CM

## 2024-01-03 ENCOUNTER — Encounter: Payer: Self-pay | Admitting: Adult Health

## 2024-05-23 ENCOUNTER — Ambulatory Visit (HOSPITAL_BASED_OUTPATIENT_CLINIC_OR_DEPARTMENT_OTHER): Admitting: Physician Assistant

## 2024-05-23 ENCOUNTER — Ambulatory Visit (HOSPITAL_BASED_OUTPATIENT_CLINIC_OR_DEPARTMENT_OTHER)

## 2024-05-23 ENCOUNTER — Encounter (HOSPITAL_BASED_OUTPATIENT_CLINIC_OR_DEPARTMENT_OTHER): Payer: Self-pay | Admitting: Physician Assistant

## 2024-05-23 DIAGNOSIS — M79644 Pain in right finger(s): Secondary | ICD-10-CM | POA: Diagnosis not present

## 2024-05-23 DIAGNOSIS — M1811 Unilateral primary osteoarthritis of first carpometacarpal joint, right hand: Secondary | ICD-10-CM | POA: Diagnosis not present

## 2024-05-23 DIAGNOSIS — M19041 Primary osteoarthritis, right hand: Secondary | ICD-10-CM | POA: Diagnosis not present

## 2024-05-23 NOTE — Progress Notes (Signed)
 Office Visit Note   Patient: Nicole Holt           Date of Birth: 10-Sep-1941           MRN: [id number] Visit Date: 05/23/2024              Requested by: Job Lukes, [place] [address] [place],  [place] [address] PCP: Job Lukes, [place]   Assessment & Plan: Visit Diagnoses:  1. Pain of right thumb     Plan: Patient is an 83 year old woman with a chief complaint of right thumb pain.  She denies any injuries.  She denies any triggering.  She is right-hand dominant sometimes she hears a click from she has been trying to do some weight training with overhead weights.  This seems to make it hurt more.  She does have some mild tenderness and pain more over the DIP rather than PIP or CMC joint.  She has no triggering.  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  I told her she could treat this symptomatically.  Could consider injections if they get too painful which she does not want to do.  Talked about trying some Voltaren  gel.  Told her to refrain from activities that seem to make this worse.  She could consider a brace if she wants.  She may follow-up as needed  Follow-Up Instructions: Return if symptoms worsen or fail to improve.   Orders:  Orders Placed This Encounter  Procedures   DG Finger Thumb Right   No orders of the defined types were placed in this encounter.     Procedures: No procedures performed   Clinical Data: No additional findings.   Subjective: No chief complaint on file.   HPI pleasant 83 year old woman with a chief complaint of right thumb pain.  Denies any triggering.  Denies any specific injury though she is right-hand dominant  Review of Systems  All other systems reviewed and are negative.    Objective: Vital Signs: There were no vitals taken for this visit.  Physical Exam Constitutional:      Appearance: Normal appearance.  Pulmonary:     Effort: Pulmonary effort is normal.  Skin:    General: Skin is warm  and dry.  Neurological:     General: No focal deficit present.     Mental Status: She is alert.  Psychiatric:        Mood and Affect: Mood normal.        Behavior: Behavior normal.     Ortho Exam Examination of her right thumb she does have some baseline swelling especially over the DIP PIP and CMC joints.  No redness no cellulitis.  Sensation is intact with brisk capillary refill strong pulses.  She does have some extension on both of her thumbs at the DIP joint.  There is no triggering.  She has pain mostly over the [facility] joint itself and over the DIP joint.  Is able to clench and extend her fingers with fair strength Specialty Comments:  No specialty comments available.  Imaging: No results found.   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   Overactive bladder 11/14/2021   Osteoarthritis 06/20/2021   Bilateral hearing loss 02/20/2021   Gastroesophageal reflux disease with esophagitis without hemorrhage 02/20/2021   Seasonal affective disorder (HCC) 11/08/2019   PND (post-nasal drip) 10/30/2019   Sleep related choking sensation 10/30/2019  Insomnia 08/31/2019   Allergic rhinitis 04/24/2019   Hyperglycemia 10/19/2018   Constipation 10/14/2018   Rectal bleeding 10/14/2018   Dyslipidemia 05/24/2018   Rosacea 09/18/2015   S/P cataract surgery 09/18/2015   OSA on CPAP 02/02/2014   Tremor, essential 02/02/2014   UARS (upper airway resistance syndrome) 02/02/2014   Osteoporosis Last DEXA 04/2022 07/28/2012   Glaucoma 07/28/2012   Restless leg syndrome 07/28/2012   Anxiety 07/28/2012   Benign positional vertigo 07/28/2012   Tinnitus 07/27/2012   Hearing loss 07/27/2012   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    Osteoporosis    Sleep apnea    Tinnitus     Family History  Problem Relation Age of Onset   Osteoporosis Mother    Stroke Mother    Hyperlipidemia Mother    Hypertension Mother    Kidney disease Father    Congestive Heart Failure Father    Heart disease Father    Hyperlipidemia Father    Stroke Sister    Parkinson's disease Sister    Heart disease Sister    Heart disease Maternal Grandmother    Gout Maternal Grandmother    Stroke Maternal Grandfather    Stroke Paternal Grandmother    Thrombosis Paternal Grandfather    Early death Paternal Grandfather    Hypertension Brother    Diabetes Brother    Arthritis/Rheumatoid Other    Parkinson's disease Other    Heart disease Other    Obesity Daughter    Breast cancer Neg Hx     Past Surgical History:  Procedure Laterality Date   AIR/FLUID EXCHANGE Right 09/12/2020   Procedure: AIR/FLUID EXCHANGE;  Surgeon: Valdemar Rogue, MD;  Location: [facility] OR;  Service: Ophthalmology;  Laterality: Right;   CATARACT EXTRACTION Bilateral OD: 10.25.21 OS: 12.2015   Dr. Cyrus   cataract surgery     COSMETIC SURGERY     EYE SURGERY Bilateral    Cat Sx - Dr. Cyrus   EYE SURGERY Right 09/12/2020   PPV/EL for retained lens material - Dr. Rogue Valdemar   PARS PLANA VITRECTOMY Right 09/12/2020   Procedure: PARS PLANA VITRECTOMY WITH 23 GAUGE;  Surgeon: Valdemar Rogue, MD;  Location: [facility] OR;  Service: Ophthalmology;  Laterality: Right;   PHOTOCOAGULATION WITH LASER Right 09/12/2020   Procedure: PHOTOCOAGULATION WITH LASER;  Surgeon: Valdemar Rogue, MD;  Location[facility] OR;  Service: Ophthalmology;  Laterality: Right;   REMOVAL RETAINED LENS Right 09/12/2020   Procedure: REMOVAL RETAINED LENS;   Surgeon: Valdemar Rogue, MD;  Location: [facility] OR;  Service: Ophthalmology;  Laterality: Right;   Social History   Occupational History   Occupation: Retired  Tobacco Use   Smoking status: Former   Smokeless tobacco: Never   Tobacco comments:    Quit in 1987  Vaping Use   Vaping status: Never Used  Substance and Sexual Activity   Alcohol use: Not Currently    Alcohol/week: 12.0  standard drinks of alcohol    Types: 12 Glasses of wine per week    Comment: wine or alcohol daily   Drug use: No   Sexual activity: Yes    Birth control/protection: Post-menopausal

## 2024-07-04 ENCOUNTER — Encounter: Payer: Self-pay | Admitting: Physician Assistant

## 2024-07-04 ENCOUNTER — Ambulatory Visit (INDEPENDENT_AMBULATORY_CARE_PROVIDER_SITE_OTHER): Admitting: Physician Assistant

## 2024-07-04 VITALS — BP 130/70 | HR 54 | Temp 97.5°F | Ht 61.5 in | Wt 173.0 lb

## 2024-07-04 DIAGNOSIS — G4733 Obstructive sleep apnea (adult) (pediatric): Secondary | ICD-10-CM

## 2024-07-04 DIAGNOSIS — R194 Change in bowel habit: Secondary | ICD-10-CM | POA: Diagnosis not present

## 2024-07-04 DIAGNOSIS — F329 Major depressive disorder, single episode, unspecified: Secondary | ICD-10-CM | POA: Diagnosis not present

## 2024-07-04 DIAGNOSIS — R4789 Other speech disturbances: Secondary | ICD-10-CM

## 2024-07-04 DIAGNOSIS — Z0000 Encounter for general adult medical examination without abnormal findings: Secondary | ICD-10-CM

## 2024-07-04 DIAGNOSIS — E669 Obesity, unspecified: Secondary | ICD-10-CM

## 2024-07-04 DIAGNOSIS — F419 Anxiety disorder, unspecified: Secondary | ICD-10-CM | POA: Diagnosis not present

## 2024-07-04 DIAGNOSIS — R1030 Lower abdominal pain, unspecified: Secondary | ICD-10-CM | POA: Diagnosis not present

## 2024-07-04 LAB — URINALYSIS, ROUTINE W REFLEX MICROSCOPIC
Bilirubin Urine: NEGATIVE
Hgb urine dipstick: NEGATIVE
Ketones, ur: NEGATIVE
Leukocytes,Ua: NEGATIVE
Nitrite: NEGATIVE
Specific Gravity, Urine: <=1.005 — AB (ref 1.000–1.030)
Total Protein, Urine: NEGATIVE
Urine Glucose: NEGATIVE
Urobilinogen, UA: 0.2 (ref 0.0–1.0)
WBC, UA: NONE SEEN (ref 0–?)
pH: 6 (ref 5.0–8.0)

## 2024-07-04 LAB — LIPID PANEL
Cholesterol: 352 mg/dL — ABNORMAL HIGH (ref 0–200)
HDL: 153.4 mg/dL (ref >39.00)
LDL Cholesterol: 185 mg/dL — ABNORMAL HIGH (ref 0–99)
NonHDL: 199.07
Total CHOL/HDL Ratio: 2
Triglycerides: 70 mg/dL (ref 0.0–149.0)
VLDL: 14 mg/dL (ref 0.0–40.0)

## 2024-07-04 LAB — COMPREHENSIVE METABOLIC PANEL WITH GFR
ALT: 376 U/L — ABNORMAL HIGH (ref 0–35)
AST: 229 U/L — ABNORMAL HIGH (ref 0–37)
Albumin: 4.3 g/dL (ref 3.5–5.2)
Alkaline Phosphatase: 520 U/L — ABNORMAL HIGH (ref 39–117)
BUN: 15 mg/dL (ref 6–23)
CO2: 23 meq/L (ref 19–32)
Calcium: 9.4 mg/dL (ref 8.4–10.5)
Chloride: 101 meq/L (ref 96–112)
Creatinine, Ser: 0.99 mg/dL (ref 0.40–1.20)
GFR: 53.26 mL/min — ABNORMAL LOW (ref >60.00)
Glucose, Bld: 119 mg/dL — ABNORMAL HIGH (ref 70–99)
Potassium: 4.5 meq/L (ref 3.5–5.1)
Sodium: 139 meq/L (ref 135–145)
Total Bilirubin: 2.4 mg/dL — ABNORMAL HIGH (ref 0.2–1.2)
Total Protein: 7.3 g/dL (ref 6.0–8.3)

## 2024-07-04 LAB — CBC WITH DIFFERENTIAL/PLATELET
Basophils Absolute: 0.1 K/uL (ref 0.0–0.1)
Basophils Relative: 0.8 % (ref 0.0–3.0)
Eosinophils Absolute: 0 K/uL (ref 0.0–0.7)
Eosinophils Relative: 0 % (ref 0.0–5.0)
HCT: 41.8 % (ref 36.0–46.0)
Hemoglobin: 13.8 g/dL (ref 12.0–15.0)
Lymphocytes Relative: 24.6 % (ref 12.0–46.0)
Lymphs Abs: 1.6 K/uL (ref 0.7–4.0)
MCHC: 33 g/dL (ref 30.0–36.0)
MCV: 92.8 fl (ref 78.0–100.0)
Monocytes Absolute: 0.5 K/uL (ref 0.1–1.0)
Monocytes Relative: 7.4 % (ref 3.0–12.0)
Neutro Abs: 4.3 K/uL (ref 1.4–7.7)
Neutrophils Relative %: 67.2 % (ref 43.0–77.0)
Platelets: 264 K/uL (ref 150.0–400.0)
RBC: 4.5 Mil/uL (ref 3.87–5.11)
RDW: 15.7 % — ABNORMAL HIGH (ref 11.5–15.5)
WBC: 6.4 K/uL (ref 4.0–10.5)

## 2024-07-04 LAB — B12 AND FOLATE PANEL
Folate: 22.6 ng/mL (ref >5.9)
Vitamin B-12: 334 pg/mL (ref 211–911)

## 2024-07-04 LAB — IBC + FERRITIN
Ferritin: 853.5 ng/mL — ABNORMAL HIGH (ref 10.0–291.0)
Iron: 134 ug/dL (ref 42–145)
Saturation Ratios: 36.8 % (ref 20.0–50.0)
TIBC: 364 ug/dL (ref 250.0–450.0)
Transferrin: 260 mg/dL (ref 212.0–360.0)

## 2024-07-04 LAB — TSH: TSH: 1.23 u[IU]/mL (ref 0.35–5.50)

## 2024-07-04 MED ORDER — DIAZEPAM 5 MG PO TABS: 5.0000 mg | ORAL_TABLET | Freq: Four times a day (QID) | ORAL | 0 refills | Status: AC | PRN

## 2024-07-04 MED ORDER — BUSPIRONE HCL 5 MG PO TABS: 5.0000 mg | ORAL_TABLET | Freq: Two times a day (BID) | ORAL | 1 refills | Status: DC | PRN

## 2024-07-04 NOTE — Patient Instructions (Signed)
  VISIT SUMMARY: Today, we addressed your sleep disturbances, anxiety, muscle cramps, constipation, and memory issues. We discussed your concerns and made adjustments to your treatment plan to help manage these conditions.  YOUR PLAN: CONSTIPATION: You have chronic constipation with recent changes in your stool and urine color. Your colonoscopy was normal, but we want to investigate further. -We will order a CT scan for your liver and gallbladder in 1-2 weeks. -Continue your current bowel regimen. -Avoid taking magnesium supplements at the same time as other laxatives to prevent a laxative effect.  GENERALIZED ANXIETY DISORDER AND DEPRESSION: Your anxiety has worsened due to various stressors, and you have concerns about dependency on Valium . -We will refill your Valium  for occasional use but advise against daily use. -We are prescribing Buspar  for anxiety, which has mild effects and is less likely to cause drowsiness. -Try to avoid watching the news to help reduce your anxiety.  OBSTRUCTIVE SLEEP APNEA: You have been using a CPAP machine for a long time but still experience sleep disturbances. -Continue using your CPAP machine as prescribed.  MUSCLE CRAMPS: You have persistent muscle cramps that may be due to a magnesium deficiency. -Monitor your response to the magnesium supplement and adjust the dosage as needed.  COGNITIVE CONCERNS: You have memory issues and difficulty with word recall, but your MRI and aphasia evaluation were normal. -Continue following up with neurology for your cognitive concerns.  GENERAL HEALTH MAINTENANCE: We are keeping up with your general health needs and screenings. -We will order blood work to check for vitamin and mineral deficiencies, especially considering your past alcohol use.                      Contains text generated by Abridge.                                 Contains text generated by Abridge.

## 2024-07-04 NOTE — Progress Notes (Signed)
 Subjective:    Nicole Holt is a 83 y.o. female and is here for a comprehensive physical exam.  HPI  There are no preventive care reminders to display for this patient.  Discussed the use of AI scribe software for clinical note transcription with the patient, who gave verbal consent to proceed.  History of Present Illness Nicole Holt is an 83 year old female with sleep apnea and anxiety who presents for Comprehensive Physical Exam (CPE) preventive care annual visit.  She experiences difficulty sleeping despite using a CPAP machine. She frequently wakes up feeling hot. Melatonin gummies are insufficient for sleep, and she has not been taking Valium , which previously helped. Significant anxiety is present, related to peers' health issues, global events, and personal stressors, including her daughter's marital issues. She has resumed alcohol use but abstained for the past six days, previously using Valium  for anxiety but is concerned about dependency.  Muscle cramps in her hands and legs disrupt sleep, worsening when lying on her right side. She manages constipation with dietary changes and laxatives, noting sticky, discolored bowel movements and recent changes in urine color. Despite a normal colonoscopy, she is concerned about gastrointestinal issues.  She experiences memory issues, with difficulty in word recall and concentration, attributing some to anxiety and sleep disturbances. Arthritis limits physical activity, with increased pain after attempting to walk or use a treadmill.  Health Maintenance: Immunizations -- UpToDate  Colonoscopy -- 2023; utd Mammogram -- 2025; utd PAP -- n/a Bone Density -- June 2023; utd Diet -- balanced as able Exercise -- limited  Sleep habits -- see above Mood -- see above  UTD with dentist? - yes UTD with eye doctor? - yes  Weight history: Wt Readings from Last 10 Encounters:  07/04/24 173 lb (78.5 kg)  12/13/23 176 lb 12.8 oz (80.2 kg)   11/24/23 177 lb (80.3 kg)  11/23/23 176 lb (79.8 kg)  03/19/23 177 lb (80.3 kg)  11/30/22 177 lb (80.3 kg)  11/24/22 178 lb 8 oz (81 kg)  11/16/22 178 lb 3.2 oz (80.8 kg)  05/06/22 176 lb 3.2 oz (79.9 kg)  04/16/22 176 lb 6.4 oz (80 kg)   Body mass index is 32.16 kg/m. No LMP recorded. Patient is postmenopausal.  Alcohol use:  reports that she does not currently use alcohol after a past usage of about 12.0 standard drinks of alcohol per week.  Tobacco use:  Tobacco Use: Medium Risk (07/04/2024)   Patient History    Smoking Tobacco Use: Former    Smokeless Tobacco Use: Never    Passive Exposure: Not on file   Eligible for lung cancer screening? no     07/04/2024   12:11 PM  Depression screen PHQ 2/9  Decreased Interest 2  Down, Depressed, Hopeless 1  PHQ - 2 Score 3  Altered sleeping 3  Tired, decreased energy 2  Change in appetite 0  Feeling bad or failure about yourself  1  Trouble concentrating 1  Moving slowly or fidgety/restless 1  Suicidal thoughts 0  PHQ-9 Score 11  Difficult doing work/chores Somewhat difficult     Other providers/specialists: Patient Care Team: Job Lukes, [place] as PCP - General (Physician Assistant) Gladis Inocente Coy, NP as Nurse Practitioner (Neurology) Cyrus Carwin, MD as Consulting Physician (Ophthalmology) Dohmeier, Dedra, MD as Consulting Physician (Neurology) [facility], [facility] Keri Pandora Cadet, [facility] as Pharmacist (Pharmacist)    PMHx, SurgHx, SocialHx, Medications, and Allergies were reviewed in the Visit Navigator and updated as appropriate.  Past Medical History:  Diagnosis Date   Allergy    Anxiety    panic attack   Depression    GERD (gastroesophageal reflux disease)    Glaucoma    Glaucoma    Hearing loss    History of basal cell cancer 08/19/2012   Removed from nose   Hyperlipidemia    Hypertensive retinopathy    OU   Migraines    ocular   OSA on CPAP 02/02/2014   PSG 09-27-2012 RDI 18.9  and AHi 9.8, Epworth 15 .titrated to only 5 cm water .    Osteoporosis    Sleep apnea    Tinnitus      Past Surgical History:  Procedure Laterality Date   AIR/FLUID EXCHANGE Right 09/12/2020   Procedure: AIR/FLUID EXCHANGE;  Surgeon: Valdemar Rogue, MD;  Location: [facility] OR;  Service: Ophthalmology;  Laterality: Right;   CATARACT EXTRACTION Bilateral OD: 10.25.21 OS: 12.2015   Dr. Cyrus   cataract surgery     COSMETIC SURGERY     EYE SURGERY Bilateral    Cat Sx - Dr. Cyrus   EYE SURGERY Right 09/12/2020   PPV/EL for retained lens material - Dr. Rogue Valdemar   PARS PLANA VITRECTOMY Right 09/12/2020   Procedure: PARS PLANA VITRECTOMY WITH 23 GAUGE;  Surgeon: Valdemar Rogue, MD;  Location: [facility] OR;  Service: Ophthalmology;  Laterality: Right;   PHOTOCOAGULATION WITH LASER Right 09/12/2020   Procedure: PHOTOCOAGULATION WITH LASER;  Surgeon: Valdemar Rogue, MD;  Location: [facility] OR;  Service: Ophthalmology;  Laterality: Right;   REMOVAL RETAINED LENS Right 09/12/2020   Procedure: REMOVAL RETAINED LENS;  Surgeon: Valdemar Rogue, MD;  Location: [facility] OR;  Service: Ophthalmology;  Laterality: Right;     Family History  Problem Relation Age of Onset   Osteoporosis Mother    Stroke Mother    Hyperlipidemia Mother    Hypertension Mother    Kidney disease Father    Congestive Heart Failure Father    Heart disease Father    Hyperlipidemia Father    Stroke Sister    Parkinson's disease Sister    Heart disease Sister    Heart disease Maternal Grandmother    Gout Maternal Grandmother    Stroke Maternal Grandfather    Stroke Paternal Grandmother    Thrombosis Paternal Grandfather    Early death Paternal Grandfather    Hypertension Brother    Diabetes Brother    Arthritis/Rheumatoid Other    Parkinson's disease Other    Heart disease Other    Obesity Daughter    Breast cancer Neg Hx     Social History   Tobacco Use   Smoking status: Former   Smokeless tobacco: Never   Tobacco comments:     Quit in 1987  Vaping Use   Vaping status: Never Used  Substance Use Topics   Alcohol use: Not Currently    Alcohol/week: 12.0 standard drinks of alcohol    Types: 12 Glasses of wine per week    Comment: wine or alcohol daily   Drug use: No    Review of Systems:   Review of Systems  Constitutional:  Negative for chills, fever, malaise/fatigue and weight loss.  HENT:  Negative for hearing loss, sinus pain and sore throat.   Respiratory:  Negative for cough and hemoptysis.   Cardiovascular:  Negative for chest pain, palpitations, leg swelling and PND.  Gastrointestinal:  Negative for abdominal pain, constipation, diarrhea, heartburn, nausea and vomiting.  Genitourinary:  Negative for dysuria, frequency and urgency.  Musculoskeletal:  Negative for back pain, myalgias and neck pain.  Skin:  Negative for itching and rash.  Neurological:  Negative for dizziness, tingling, seizures and headaches.  Endo/Heme/Allergies:  Negative for polydipsia.  Psychiatric/Behavioral:  Negative for depression. The patient is not nervous/anxious.     Objective:   BP 130/70 (BP Location: Left Arm, Patient Position: Sitting, Cuff Size: Large)   Pulse (!) 54   Temp (!) 97.5 F (36.4 C) (Temporal)   Ht 5' 1.5 (1.562 m)   Wt 173 lb (78.5 kg)   SpO2 96%   BMI 32.16 kg/m  Body mass index is 32.16 kg/m.   General Appearance:    Alert, cooperative, no distress, appears stated age  Head:    Normocephalic, without obvious abnormality, atraumatic  Eyes:    PERRL, conjunctiva/corneas clear, EOM's intact, fundi    benign, both eyes  Ears:    Normal TM's and external ear canals, both ears  Nose:   Nares normal, septum midline, mucosa normal, no drainage    or sinus tenderness  Throat:   Lips, mucosa, and tongue normal; teeth and gums normal  Neck:   Supple, symmetrical, trachea midline, no adenopathy;    thyroid :  no enlargement/tenderness/nodules; no carotid   bruit or JVD  Back:     Symmetric, no  curvature, ROM normal, no CVA tenderness  Lungs:     Clear to auscultation bilaterally, respirations unlabored  Chest Wall:    No tenderness or deformity   Heart:    Regular rate and rhythm, S1 and S2 normal, no murmur, rub or gallop  Breast Exam:    Deferred  Abdomen:     Soft, tenderness to palpation to lower abdomen, no rebounding or guarding, bowel sounds active all four quadrants,    no masses, no organomegaly  Genitalia:    Deferred   Extremities:   Extremities normal, atraumatic, no cyanosis or edema  Pulses:   2+ and symmetric all extremities  Skin:   Skin color, texture, turgor normal, no rashes or lesions  Lymph nodes:   Cervical, supraclavicular, and axillary nodes normal  Neurologic:   CNII-XII intact, normal strength, sensation and reflexes    throughout    Assessment/Plan:   Assessment and Plan Assessment & Plan General Health Maintenance Up to date on cancer screenings. Blood work planned for deficiencies, considering past alcohol use. - Order blood work for vitamin and mineral deficiencies.  Bowel changes and Lower Abdominal Pain Chronic constipation with recent stool changes. Colonoscopy in October 2023 was difficult due to age. Concerns about gallbladder or cancer. Recent urine discoloration. - Order CT scan - Continue to adjust bowel regimen  - Avoid simultaneous magnesium supplements to prevent laxative effect.  Reactive Depression and Anxiety  Chronic anxiety worsened by stressors. Recent alcohol cessation. Concerns about Valium  dependency. Interested in non-lethargic anxiety management. - Refill Valium  for occasional use, cautioning against daily use. - Prescribe Buspar  5 mg twice daily as needed for anxiety, highlighting mild effects. - Encourage avoidance of news to reduce anxiety.  Obstructive sleep apnea Long-term CPAP use without tolerance issues. Experiences waking hot and with increased blood pressure.  Word Finding Difficulty Memory issues and  word retrieval difficulty. Normal MRI and negative aphasia evaluation. Concerns about vascular changes. - Continue neurology follow-up for cognitive concerns.        Lucie Buttner, PA-C [facility]

## 2024-07-05 LAB — URINE CULTURE
MICRO NUMBER:: 16910243
Result:: NO GROWTH
SPECIMEN QUALITY:: ADEQUATE

## 2024-07-08 LAB — VITAMIN B1: Vitamin B1 (Thiamine): 9 nmol/L (ref 8–30)

## 2024-07-10 ENCOUNTER — Ambulatory Visit: Payer: Self-pay | Admitting: Physician Assistant

## 2024-07-10 DIAGNOSIS — R7989 Other specified abnormal findings of blood chemistry: Secondary | ICD-10-CM

## 2024-07-11 ENCOUNTER — Ambulatory Visit
Admission: RE | Admit: 2024-07-11 | Discharge: 2024-07-11 | Disposition: A | Discharge status: Home / Self Care | Source: Ambulatory Visit | Attending: Physician Assistant | Admitting: Physician Assistant

## 2024-07-11 DIAGNOSIS — K5900 Constipation, unspecified: Secondary | ICD-10-CM | POA: Diagnosis not present

## 2024-07-11 DIAGNOSIS — R100 Acute abdomen: Secondary | ICD-10-CM | POA: Diagnosis not present

## 2024-07-11 DIAGNOSIS — R194 Change in bowel habit: Secondary | ICD-10-CM

## 2024-07-11 DIAGNOSIS — R1030 Lower abdominal pain, unspecified: Secondary | ICD-10-CM

## 2024-07-11 MED ORDER — IOPAMIDOL (ISOVUE-300) INJECTION 61%
100.0000 mL | Freq: Once | INTRAVENOUS | Status: AC | PRN
Start: 2024-07-11 — End: 2024-07-11
  Administered 2024-07-11: 100 mL via INTRAVENOUS

## 2024-07-13 ENCOUNTER — Other Ambulatory Visit (INDEPENDENT_AMBULATORY_CARE_PROVIDER_SITE_OTHER)

## 2024-07-13 DIAGNOSIS — R7989 Other specified abnormal findings of blood chemistry: Secondary | ICD-10-CM

## 2024-07-13 LAB — PROTIME-INR
INR: 1.1 ratio — ABNORMAL HIGH (ref 0.8–1.0)
Prothrombin Time: 11.5 s (ref 9.6–13.1)

## 2024-07-13 LAB — HEMOGLOBIN A1C: Hgb A1c MFr Bld: 6.2 % (ref 4.6–6.5)

## 2024-07-13 LAB — HEPATIC FUNCTION PANEL
ALT: 271 U/L — ABNORMAL HIGH (ref 0–35)
AST: 160 U/L — ABNORMAL HIGH (ref 0–37)
Albumin: 4.2 g/dL (ref 3.5–5.2)
Alkaline Phosphatase: 495 U/L — ABNORMAL HIGH (ref 39–117)
Bilirubin, Direct: 1.1 mg/dL — ABNORMAL HIGH (ref 0.0–0.3)
Total Bilirubin: 2.4 mg/dL — ABNORMAL HIGH (ref 0.2–1.2)
Total Protein: 7.4 g/dL (ref 6.0–8.3)

## 2024-07-14 ENCOUNTER — Ambulatory Visit
Admission: RE | Admit: 2024-07-14 | Discharge: 2024-07-14 | Disposition: A | Discharge status: Home / Self Care | Source: Ambulatory Visit | Attending: Physician Assistant | Admitting: Physician Assistant

## 2024-07-14 ENCOUNTER — Ambulatory Visit: Payer: Self-pay | Admitting: Physician Assistant

## 2024-07-14 ENCOUNTER — Other Ambulatory Visit: Payer: Self-pay | Admitting: Physician Assistant

## 2024-07-14 DIAGNOSIS — R7989 Other specified abnormal findings of blood chemistry: Secondary | ICD-10-CM

## 2024-07-14 DIAGNOSIS — K824 Cholesterolosis of gallbladder: Secondary | ICD-10-CM | POA: Diagnosis not present

## 2024-07-14 DIAGNOSIS — R1900 Intra-abdominal and pelvic swelling, mass and lump, unspecified site: Secondary | ICD-10-CM

## 2024-07-14 NOTE — Progress Notes (Signed)
 Noted

## 2024-07-14 NOTE — Addendum Note (Signed)
 Addended by: THURMON ARLAND PARAS on: 07/14/2024 12:09 PM   Modules accepted: Orders

## 2024-07-15 ENCOUNTER — Encounter (HOSPITAL_COMMUNITY): Payer: Self-pay

## 2024-07-15 ENCOUNTER — Ambulatory Visit (HOSPITAL_COMMUNITY)

## 2024-07-15 ENCOUNTER — Ambulatory Visit (HOSPITAL_COMMUNITY)
Admission: RE | Admit: 2024-07-15 | Discharge: 2024-07-15 | Disposition: A | Discharge status: Home / Self Care | Source: Ambulatory Visit | Attending: Physician Assistant | Admitting: Physician Assistant

## 2024-07-15 DIAGNOSIS — R1900 Intra-abdominal and pelvic swelling, mass and lump, unspecified site: Secondary | ICD-10-CM | POA: Diagnosis not present

## 2024-07-15 DIAGNOSIS — R160 Hepatomegaly, not elsewhere classified: Secondary | ICD-10-CM | POA: Diagnosis not present

## 2024-07-15 DIAGNOSIS — I700 Atherosclerosis of aorta: Secondary | ICD-10-CM | POA: Diagnosis not present

## 2024-07-15 MED ORDER — GADOBUTROL 1 MMOL/ML IV SOLN
8.0000 mL | Freq: Once | INTRAVENOUS | Status: AC | PRN
  Administered 2024-07-15: 8 mL via INTRAVENOUS

## 2024-07-18 ENCOUNTER — Other Ambulatory Visit: Payer: Self-pay | Admitting: *Deleted

## 2024-07-18 ENCOUNTER — Encounter: Payer: Self-pay | Admitting: Medical Oncology

## 2024-07-18 ENCOUNTER — Ambulatory Visit: Payer: Self-pay | Admitting: Physician Assistant

## 2024-07-18 DIAGNOSIS — R160 Hepatomegaly, not elsewhere classified: Secondary | ICD-10-CM

## 2024-07-18 NOTE — Progress Notes (Signed)
 I called and tried to speak with patient. I spoke with her husband, Ozell, who said she was sleeping. He said that he would tell her to call us  back when she wakes up.  Please let patient know that her MRI shows a large mass in her liver that is very suspicious for possible cancer. We need to refer her to oncology as soon as possible to get further testing and information.

## 2024-07-20 LAB — HEPATITIS PANEL, ACUTE
Hep A IgM: NONREACTIVE
Hep B C IgM: NONREACTIVE
Hepatitis B Surface Ag: NONREACTIVE
Hepatitis C Ab: NONREACTIVE

## 2024-07-20 LAB — ANTI-NUCLEAR AB-TITER (ANA TITER): ANA Titer 1: 1:40 {titer} — ABNORMAL HIGH

## 2024-07-20 LAB — ANA: Anti Nuclear Antibody (ANA): POSITIVE — AB

## 2024-07-21 ENCOUNTER — Inpatient Hospital Stay: Attending: Physician Assistant

## 2024-07-21 ENCOUNTER — Inpatient Hospital Stay: Admitting: Physician Assistant

## 2024-07-21 ENCOUNTER — Encounter (HOSPITAL_COMMUNITY): Payer: Self-pay

## 2024-07-21 ENCOUNTER — Encounter: Payer: Self-pay | Admitting: Medical Oncology

## 2024-07-21 VITALS — BP 145/76 | HR 61 | Temp 97.9°F | Resp 13 | Wt 168.3 lb

## 2024-07-21 DIAGNOSIS — R7989 Other specified abnormal findings of blood chemistry: Secondary | ICD-10-CM | POA: Diagnosis not present

## 2024-07-21 DIAGNOSIS — G893 Neoplasm related pain (acute) (chronic): Secondary | ICD-10-CM | POA: Insufficient documentation

## 2024-07-21 DIAGNOSIS — K5900 Constipation, unspecified: Secondary | ICD-10-CM | POA: Insufficient documentation

## 2024-07-21 DIAGNOSIS — Z87891 Personal history of nicotine dependence: Secondary | ICD-10-CM

## 2024-07-21 DIAGNOSIS — R160 Hepatomegaly, not elsewhere classified: Secondary | ICD-10-CM

## 2024-07-21 DIAGNOSIS — M810 Age-related osteoporosis without current pathological fracture: Secondary | ICD-10-CM | POA: Insufficient documentation

## 2024-07-21 DIAGNOSIS — C7B02 Secondary carcinoid tumors of liver: Secondary | ICD-10-CM | POA: Insufficient documentation

## 2024-07-21 DIAGNOSIS — K3189 Other diseases of stomach and duodenum: Secondary | ICD-10-CM | POA: Diagnosis not present

## 2024-07-21 LAB — CBC WITH DIFFERENTIAL (CANCER CENTER ONLY)
Abs Immature Granulocytes: 0.03 K/uL (ref 0.00–0.07)
Basophils Absolute: 0.1 K/uL (ref 0.0–0.1)
Basophils Relative: 2 %
Eosinophils Absolute: 0 K/uL (ref 0.0–0.5)
Eosinophils Relative: 0 %
HCT: 43.8 % (ref 36.0–46.0)
Hemoglobin: 14.7 g/dL (ref 12.0–15.0)
Immature Granulocytes: 1 %
Lymphocytes Relative: 31 %
Lymphs Abs: 1.7 K/uL (ref 0.7–4.0)
MCH: 31.1 pg (ref 26.0–34.0)
MCHC: 33.6 g/dL (ref 30.0–36.0)
MCV: 92.8 fL (ref 80.0–100.0)
Monocytes Absolute: 0.4 K/uL (ref 0.1–1.0)
Monocytes Relative: 8 %
Neutro Abs: 3.2 K/uL (ref 1.7–7.7)
Neutrophils Relative %: 58 %
Platelet Count: 286 K/uL (ref 150–400)
RBC: 4.72 MIL/uL (ref 3.87–5.11)
RDW: 15.2 % (ref 11.5–15.5)
WBC Count: 5.5 K/uL (ref 4.0–10.5)
nRBC: 0 % (ref 0.0–0.2)

## 2024-07-21 LAB — CMP (CANCER CENTER ONLY)
ALT: 242 U/L — ABNORMAL HIGH (ref 0–44)
AST: 175 U/L (ref 15–41)
Albumin: 4.6 g/dL (ref 3.5–5.0)
Alkaline Phosphatase: 511 U/L — ABNORMAL HIGH (ref 38–126)
Anion gap: 8 (ref 5–15)
BUN: 14 mg/dL (ref 8–23)
CO2: 29 mmol/L (ref 22–32)
Calcium: 10.3 mg/dL (ref 8.9–10.3)
Chloride: 101 mmol/L (ref 98–111)
Creatinine: 0.98 mg/dL (ref 0.44–1.00)
GFR, Estimated: 58 mL/min — ABNORMAL LOW (ref >60)
Glucose, Bld: 107 mg/dL — ABNORMAL HIGH (ref 70–99)
Potassium: 5.1 mmol/L (ref 3.5–5.1)
Sodium: 138 mmol/L (ref 135–145)
Total Bilirubin: 1.6 mg/dL — ABNORMAL HIGH (ref 0.0–1.2)
Total Protein: 8 g/dL (ref 6.5–8.1)

## 2024-07-21 LAB — CEA (ACCESS): CEA (CHCC): 2 ng/mL (ref 0.00–5.00)

## 2024-07-21 NOTE — Progress Notes (Signed)
 ADDENDUM:  Patient was personally and independently interviewed, examined and relevant elements of the history of present illness were reviewed in details and an assessment and plan was created. All elements of the patient's history of present illness, assessment and plan were discussed in detail with Kaitlyn Walisiewicz. The above documentation reflects our combined findings assessment and plan.   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  Will pursue additional workup including AFP and other tumor markers today and proceed with ultrasound-guided biopsy of the liver lesion.  Plan to see her in clinic with biopsy results to determine further course of action.

## 2024-07-21 NOTE — Progress Notes (Signed)
 [facility]  Patient presented to clinic, with spouse, for her scheduled appointment with PA-C Mallie Combes. I introduced myself and provided them with my direct contact information. Patient and spouse were both encouraged to call me with any questions/concerns they may have.  Nicole KYM Raider, RN, BSN, [facility] Oncology Nurse Navigator, [facility] 07/21/2024 1:04 PM

## 2024-07-21 NOTE — Progress Notes (Signed)
 Nicole Aran, MD  Nicole Holt PROCEDURE / BIOPSY REVIEW Date: 07/21/24  Requested Biopsy site: Liver mass Reason for request: Liver mass Imaging review: Best seen on MR, US  and CT  Decision: Approved Imaging modality to perform: Ultrasound Schedule with: Moderate Sedation Schedule for: Any VIR  Additional comments:   Please contact me with questions, concerns, or if issue pertaining to this request arise.  Holt Nicole Luverne, MD [facility]iology       Previous Messages    ----- Message ----- From: Nicole Holt Sent: 07/21/2024   3:18 PM EDT To: Nicole Holt; Nicole Holt Subject: Nicole US  liver biopsy                            Procedure : US  liver biopsy  Reason :[facility] pt. liver lesion, concern for malignancy Dx: Liver mass [R16.0 (ICD-10-CM)]    History : MR liver w/wo, US  abd limited RUQ , CT abd pelvis w/  Provider : Walisiewicz, Nicole E, PA-C  Contact : [phone]

## 2024-07-21 NOTE — Progress Notes (Signed)
 [facility] [phone]   [phone]  INITIAL CONSULTATION:  Patient Care Team: Job Lukes, [place] as PCP - General (Physician Assistant) Gladis, Inocente Coy, NP as Nurse Practitioner (Neurology) Cyrus Carwin, MD as Consulting Physician (Ophthalmology) Dohmeier, Dedra, MD as Consulting Physician (Neurology) [facility], [facility] Keri Pandora Cadet, [facility] as Pharmacist (Pharmacist) Golden Forestine BROCKS, RN as Oncology Nurse Navigator (Medical Oncology)  CHIEF COMPLAINTS/PURPOSE OF CONSULTATION:  Liver lesion  HISTORY OF PRESENTING ILLNESS:  Nicole Holt 83 y.o. female with medical history significant for obstructive sleep apnea, upper airway resistance syndrome, GERD, benign positional vertigo, essential tremor, malignant basal cell neoplasm of skin, osteoporosis, glaucoma, restless leg syndrome, dyslipidemia, memory retention disorder.  On review of the previous records patient presented for comprehensive physical exam with PCP Lukes Job PA-C on 07/04/24. At that visit she reported bowel changes with constipation and lower abdominal pain so CT AP was ordered. CT AP performed on 07/11/24 was showing a 7.5 x 8.2 cm heterogeneous lesion in the central liver. MR liver was subsequently performed on 07/15/24 showing large mass situated in the central liver closely abutting the IVC and centered in hepatic segment IV, measuring 8.3 x 6.6 cm. No evidence of lymphadenopathy or metastatic disease in the abdomen. 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  Hepatic function panel was collected 07/13/24 with results significant for elevated t bili 2.4, alkaline phosphatase 495, AST/ALT 160/271.  On exam today patient is accompanied by spouse who provides additional history. Patient denies any abdominal pain currently. She reports a longstanding history of constipation. She experiences  changes in bowel movements, including constipation and changes in stool color, which she attributes to her liver and gallbladder issues. Her stool color has recently normalized after dietary changes. She reports very little nausea, no vomiting except for one episode she attributed to food poisoning, and no significant changes in appetite until recently, when she lost 5 pounds over the past two weeks due to eating less and increased anxiety. She admits to intermittent nausea noting that it is not bothersome currently. She denies early satiety.  She reports a history of alcohol use, associated with periods of anxiety and depression. She reports periods of heavy drinking, particularly during stressful times, but has recently abstained for 21 days.  She quit smoking in the late 1980s after smoking for approximately 18 years, on average 1 ppd.Patient's last colonoscopy was in 2023 and most recent mammogram was earlier this year. She has an oncologic family history of paternal aunt with colon cancer who was diagnosed in her mid 17s.   MEDICAL HISTORY:  Past Medical History:  Diagnosis Date   Allergy    Anxiety    panic attack   Depression    GERD (gastroesophageal reflux disease)    Glaucoma    Glaucoma    Hearing loss    History of basal cell cancer 08/19/2012   Removed from nose   Hyperlipidemia    Hypertensive retinopathy    OU   Migraines    ocular   OSA on CPAP 02/02/2014   PSG 09-27-2012 RDI 18.9 and AHi 9.8, Epworth 15 .titrated to only 5 cm water .    Osteoporosis    Sleep apnea    Tinnitus     SURGICAL HISTORY: Past Surgical History:  Procedure Laterality Date   AIR/FLUID EXCHANGE Right 09/12/2020   Procedure: AIR/FLUID EXCHANGE;  Surgeon: Valdemar Rogue, MD;  Location: [facility] OR;  Service: Ophthalmology;  Laterality:  Right;   CATARACT EXTRACTION Bilateral OD: 10.25.21 OS: 12.2015   Dr. Cyrus   cataract surgery     COSMETIC SURGERY     EYE SURGERY Bilateral    Cat Sx - Dr.  Cyrus   EYE SURGERY Right 09/12/2020   PPV/EL for retained lens material - Dr. Redell Hans   PARS PLANA VITRECTOMY Right 09/12/2020   Procedure: PARS PLANA VITRECTOMY WITH 23 GAUGE;  Surgeon: Hans Redell, MD;  Location: [facility] OR;  Service: Ophthalmology;  Laterality: Right;   PHOTOCOAGULATION WITH LASER Right 09/12/2020   Procedure: PHOTOCOAGULATION WITH LASER;  Surgeon: Hans Redell, MD;  Location: [facility] OR;  Service: Ophthalmology;  Laterality: Right;   REMOVAL RETAINED LENS Right 09/12/2020   Procedure: REMOVAL RETAINED LENS;  Surgeon: Hans Redell, MD;  Location: [facility] OR;  Service: Ophthalmology;  Laterality: Right;    SOCIAL HISTORY: Social History   Socioeconomic History   Marital status: Married    Spouse name: Ozell   Number of children: 2   Years of education: 16   Highest education level: Some college, no degree  Occupational History   Occupation: Retired  Tobacco Use   Smoking status: Former   Smokeless tobacco: Never   Tobacco comments:    Quit in 1987  Vaping Use   Vaping status: Never Used  Substance and Sexual Activity   Alcohol use: Not Currently    Alcohol/week: 12.0 standard drinks of alcohol    Types: 12 Glasses of wine per week    Comment: wine or alcohol daily   Drug use: No   Sexual activity: Yes    Birth control/protection: Post-menopausal  Other Topics Concern   Not on file  Social History Narrative   Patient is married Tatiana) and lives with her husband.Patient is retired.Patient has two children.Patient is right-handed.Patient has a college education.Patient drinks two cups of coffee daily.      Right handed    Drinks 2 cups per day    Social Drivers of Health   Financial Resource Strain: Low Risk  (11/23/2023)   Overall Financial Resource Strain (CARDIA)    Difficulty of Paying Living Expenses: Not hard at all  Food Insecurity: No Food Insecurity (07/21/2024)   Hunger Vital Sign    Worried About Running Out of Food in the Last Year:  Never true    Ran Out of Food in the Last Year: Never true  Transportation Needs: No Transportation Needs (07/21/2024)   PRAPARE - Administrator, Civil Service (Medical): No    Lack of Transportation (Non-Medical): No  Physical Activity: Insufficiently Active (11/23/2023)   Exercise Vital Sign    Days of Exercise per Week: 2 days    Minutes of Exercise per Session: 60 min  Stress: Stress Concern Present (11/23/2023)   [employer] of Occupational Health - Occupational Stress Questionnaire    Feeling of Stress : To some extent  Social Connections: Moderately Isolated (11/23/2023)   Social Connection and Isolation Panel    Frequency of Communication with Friends and Family: Once a week    Frequency of Social Gatherings with Friends and Family: More than three times a week    Attends Religious Services: Never    Database administrator or Organizations: No    Attends Banker Meetings: Never    Marital Status: Married  Catering manager Violence: Not At Risk (07/21/2024)   Humiliation, Afraid, Rape, and Kick questionnaire    Fear of Current or Ex-Partner: No  Emotionally Abused: No    Physically Abused: No    Sexually Abused: No    FAMILY HISTORY: Family History  Problem Relation Age of Onset   Osteoporosis Mother    Stroke Mother    Hyperlipidemia Mother    Hypertension Mother    Kidney disease Father    Congestive Heart Failure Father    Heart disease Father    Hyperlipidemia Father    Stroke Sister    Parkinson's disease Sister    Heart disease Sister    Heart disease Maternal Grandmother    Gout Maternal Grandmother    Stroke Maternal Grandfather    Stroke Paternal Grandmother    Thrombosis Paternal Grandfather    Early death Paternal Grandfather    Hypertension Brother    Diabetes Brother    Arthritis/Rheumatoid Other    Parkinson's disease Other    Heart disease Other    Obesity Daughter    Breast cancer Neg Hx     ALLERGIES:  is  allergic to codeine, estrogens conjugated, and paroxetine hcl.  MEDICATIONS:  Current Outpatient Medications  Medication Sig Dispense Refill   busPIRone  (BUSPAR ) 5 MG tablet Take 1 tablet (5 mg total) by mouth 2 (two) times daily as needed (anxiety). 30 tablet 1   diazepam  (VALIUM ) 5 MG tablet Take 1 tablet (5 mg total) by mouth every 6 (six) hours as needed. for anxiety 30 tablet 0   fluticasone  (FLONASE ) 50 MCG/ACT nasal spray Use 1 spray(s) in each nostril once daily 16 g 0   latanoprost (XALATAN) 0.005 % ophthalmic solution      OVER THE COUNTER MEDICATION Take 10 drops by mouth daily in the afternoon. ConcenTrace     polyethylene glycol (MIRALAX / GLYCOLAX) 17 g packet Take 17 g by mouth daily as needed.     triamcinolone  cream (KENALOG ) 0.1 % Apply to affected area 1-2 times daily 30 g 0   No current facility-administered medications for this visit.    REVIEW OF SYSTEMS:   All other systems are reviewed and are negative for acute change except as noted in the HPI.  PHYSICAL EXAMINATION: ECOG PERFORMANCE STATUS: 1 - Symptomatic but completely ambulatory  Vitals:   07/21/24 1241  BP: (!) 145/76  Pulse: 61  Resp: 13  Temp: 97.9 F (36.6 C)  SpO2: 100%   Filed Weights   07/21/24 1241  Weight: 168 lb 4.8 oz (76.3 kg)    Physical Exam Vitals reviewed.  Constitutional:      Appearance: She is not ill-appearing or toxic-appearing.  HENT:     Head: Normocephalic.     Right Ear: External ear normal.     Left Ear: External ear normal.     Nose: Nose normal.     Mouth/Throat:     Mouth: Mucous membranes are moist.  Eyes:     General: Scleral icterus present.  Cardiovascular:     Rate and Rhythm: Normal rate and regular rhythm.     Pulses: Normal pulses.     Heart sounds: Normal heart sounds.  Pulmonary:     Effort: Pulmonary effort is normal.     Breath sounds: Normal breath sounds.  Abdominal:     General: Bowel sounds are normal. There is no distension.      Palpations: Abdomen is soft. There is no mass.     Tenderness: There is no abdominal tenderness. There is no guarding or rebound.     Hernia: No hernia is present.  Musculoskeletal:  General: Normal range of motion.     Cervical back: Normal range of motion.  Skin:    General: Skin is warm and dry.  Neurological:     Mental Status: She is alert and oriented to person, place, and time.  Psychiatric:        Mood and Affect: Mood is anxious. Affect is tearful.       LABORATORY DATA:  I have reviewed the data as listed    Latest Ref Rng & Units 07/04/2024   12:09 PM 11/24/2023   12:07 PM 11/24/2022    1:42 PM  CBC  WBC 4.0 - 10.5 K/uL 6.4  5.0  6.0   Hemoglobin 12.0 - 15.0 g/dL 86.1  85.1  85.5   Hematocrit 36.0 - 46.0 % 41.8  43.7  42.0   Platelets 150.0 - 400.0 K/uL 264.0  249.0  228.0        Latest Ref Rng & Units 07/13/2024   10:38 AM 07/04/2024   12:09 PM 11/24/2023   12:07 PM  CMP  Glucose 70 - 99 mg/dL  880  896   BUN 6 - 23 mg/dL  15  14   Creatinine 9.59 - 1.20 mg/dL  9.00  9.07   Sodium 864 - 145 mEq/L  139  138   Potassium 3.5 - 5.1 mEq/L  4.5  4.2   Chloride 96 - 112 mEq/L  101  101   CO2 19 - 32 mEq/L  23  29   Calcium  8.4 - 10.5 mg/dL  9.4  9.7   Total Protein 6.0 - 8.3 g/dL 7.4  7.3  7.5   Total Bilirubin 0.2 - 1.2 mg/dL 2.4  2.4  0.6   Alkaline Phos 39 - 117 U/L 495  520  59   AST 0 - 37 U/L 160  229  21   ALT 0 - 35 U/L 271  376  18      RADIOGRAPHIC STUDIES: I have personally reviewed the radiological images as listed and agreed with the findings in the report. MR LIVER W WO CONTRAST Result Date: 07/18/2024 CLINICAL DATA:  Abdominal mass, intra-abdominal neoplasm suspected large liver mass identified by ultrasound EXAM: MRI ABDOMEN WITHOUT AND WITH CONTRAST TECHNIQUE: Multiplanar multisequence MR imaging of the abdomen was performed both before and after the administration of intravenous contrast. CONTRAST:  8mL GADAVIST  GADOBUTROL  1 MMOL/ML IV SOLN  COMPARISON:  Right upper quadrant ultrasound, 07/14/2024, CT abdomen pelvis, 07/11/2024 FINDINGS: Lower chest: No acute abnormality. Hepatobiliary: Large mass situated in the central liver closely abutting the IVC and centered in hepatic segment IV, measuring 8.3 x 6.6 cm (series 6, image 13). Mass demonstrates heterogeneous early arterial hyperenhancement with some cystic or necrotic components inferiorly, with evidence of early washout and some suggestion of capsular enhancement on later contrast phases. Mass effect distorts and compresses the hepatic hilum with consequent intrahepatic biliary ductal dilatation. No gallstones, gallbladder wall thickening, or biliary dilatation. Pancreas: Unremarkable. No pancreatic ductal dilatation or surrounding inflammatory changes. Spleen: Normal in size without significant abnormality. Adrenals/Urinary Tract: Adrenal glands are unremarkable. Kidneys are normal, without renal calculi, solid lesion, or hydronephrosis. Stomach/Bowel: Stomach is within normal limits. No evidence of bowel wall thickening, distention, or inflammatory changes. Vascular/Lymphatic: Aortic atherosclerosis. No enlarged abdominal lymph nodes. Other: No abdominal wall hernia or abnormality. No ascites. Musculoskeletal: No acute or significant osseous findings. IMPRESSION: 1. Large mass situated in the central liver closely abutting the IVC and centered in hepatic segment IV, measuring 8.3  x 6.6 cm. Mass demonstrates heterogeneous early arterial hyperenhancement with some cystic or necrotic components inferiorly, with evidence of early washout and some suggestion of capsular enhancement on later contrast phases. Mass effect distorts and compresses the hepatic hilum with consequent intrahepatic biliary ductal dilatation. 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 In the absence of cirrhotic liver morphology, a large metastasis or less likely a large focal nodular hyperplasia would  remain differential considerations. 2. No evidence of lymphadenopathy or metastatic disease in the abdomen. Electronically Signed   By: Marolyn JONETTA Jaksch M.D.   On: 07/18/2024 07:32   CT ABDOMEN PELVIS W CONTRAST Result Date: 07/14/2024 CLINICAL DATA:  Abdominal pain, acute, nonlocalized. EXAM: CT ABDOMEN AND PELVIS WITH CONTRAST TECHNIQUE: Multidetector CT imaging of the abdomen and pelvis was performed using the standard protocol following bolus administration of intravenous contrast. RADIATION DOSE REDUCTION: This exam was performed according to the departmental dose-optimization program which includes automated exposure control, adjustment of the mA and/or kV according to patient size and/or use of iterative reconstruction technique. CONTRAST:  100mL ISOVUE -300 IOPAMIDOL  (ISOVUE -300) INJECTION 61% COMPARISON:  None Available. FINDINGS: Lower chest: There are patchy atelectatic changes in the visualized lung bases. No overt consolidation. No pleural effusion. The heart is normal in size. No pericardial effusion. Hepatobiliary: The liver is normal in size. Non-cirrhotic configuration. There is a 7.5 by 8.2 cm heterogeneous mixed slightly hyperattenuating and hypoattenuating lesion in the central liver. Lesion exhibit majority of the cystic component in the caudate lobe along the inferomedial aspect of the lesion. This is indeterminate on the basis of this exam. Further evaluation with multiphasic contrast-enhanced MRI abdomen as per hepatic mass protocol is recommended. The lesion causes mild central intrahepatic bile duct dilation. The portal vein tributaries are stressed and mildly compressed however, no discrete thrombosis seen. No calcified gallstones. Normal gallbladder wall thickness. No pericholecystic inflammatory changes. Pancreas: Unremarkable. No pancreatic ductal dilatation or surrounding inflammatory changes. Spleen: Within normal limits. No focal lesion. Adrenals/Urinary Tract: Adrenal glands are  unremarkable. No suspicious renal mass. No hydronephrosis. No renal or ureteric calculi. Urinary bladder is under distended, precluding optimal assessment. However, no large mass or stones identified. No perivesical fat stranding. Stomach/Bowel: There is a small sliding hiatal hernia. No disproportionate dilation of the small or large bowel loops. No evidence of abnormal bowel wall thickening or inflammatory changes. The appendix at the base is unremarkable. However, there is fluid distention of the distal appendix measuring up to 1.7 cm in diameter. No abnormal wall thickening. No surrounding fat stranding. No calcifications. Findings favor appendiceal mucocele. Surgical consultation is recommended. Vascular/Lymphatic: No ascites or pneumoperitoneum. No abdominal or pelvic lymphadenopathy, by size criteria. No aneurysmal dilation of the major abdominal arteries. There are mild peripheral atherosclerotic vascular calcifications of the aorta and its major branches. Reproductive: Not well evaluated on the CT scan exam. However, having said that normal-size anteverted uterus noted. There is a well-circumscribed 3.4 x 3.9 cm hypoattenuating structure in the left adnexa, most likely ovarian in etiology. This may represent a senescent ovarian cyst. Follow-up ultrasound is recommended in 1 year to document stability. No large right adnexal mass seen. Other: There are fat containing umbilical and bilateral inguinal hernias. The soft tissues and abdominal wall are otherwise unremarkable. Musculoskeletal: No suspicious osseous lesions. There are mild multilevel degenerative changes in the visualized spine. IMPRESSION: 1. There is a 7.5 x 8.2 cm heterogeneous lesion in the central liver, which is indeterminate on the basis of this exam. Further evaluation  with multiphasic contrast-enhanced MRI abdomen as per hepatic mass protocol is recommended. 2. There is fluid distention of the distal appendix measuring up to 1.7 cm in  diameter. No abnormal wall thickening or surrounding fat stranding. Findings favor appendiceal mucocele. Surgical consultation is recommended. 3. There is a well-circumscribed 3.4 x 3.9 cm hypoattenuating structure in the left adnexa, most likely ovarian in etiology. This may represent a senescent ovarian cyst. Follow-up ultrasound is recommended in 1 year to document stability. 4. Multiple other nonacute observations, as described above. Aortic Atherosclerosis (ICD10-I70.0). Electronically Signed   By: Ree Molt M.D.   On: 07/14/2024 10:05   US  Abdomen Limited RUQ (LIVER/GB) Result Date: 07/14/2024 CLINICAL DATA:  elevated liver labs. EXAM: ULTRASOUND ABDOMEN LIMITED RIGHT UPPER QUADRANT COMPARISON:  None Available. FINDINGS: Gallbladder: Physiologically distended gallbladder containing moderate volume dependent sludge. No abnormal wall thickening. No pericholecystic free fluid. Sonographic Murphy's sign was negative as per the technologist. Common bile duct: Diameter: 2.8 mm.  No intrahepatic bile duct dilation. Liver: Heterogeneous liver echotexture and mildly increased echogenicity. There is an approximately 7.3 x 9.7 cm solid mildly hyperechoic vascular mass in the right lobe, not well evaluated on the current exam. Further evaluation with multiphasic contrast-enhanced MRI abdomen as per hepatic mass protocol is recommended. Portal vein is patent on color Doppler imaging with normal direction of blood flow towards the liver. Other: None. IMPRESSION: 1. There is an approximately 7.3 x 9.7 cm solid mildly hyperechoic vascular mass in the right hepatic lobe, not well evaluated on the current exam. Further evaluation with multiphasic contrast-enhanced MRI abdomen as per hepatic mass protocol is recommended. 2. Moderate volume gallbladder sludge without imaging signs of acute cholecystitis. 3. Otherwise unremarkable exam. Electronically Signed   By: Ree Molt M.D.   On: 07/14/2024 09:57    ASSESSMENT  & PLAN JANAISHA TOLSMA is a 83 y.o. female presenting [facility] for consultation regarding liver lesion. Patient will proceed with laboratory workup today.   #Liver lesion - Reviewed CT AP and MR liver findings with patient and spouse. --Differentials include HCC, intrahepatic cholangiocarcinoma versus metastatic lesion. 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  - Will order labs today including CBC, CMP, AFP, CEA, CA 19-9. - Will order IR US  liver biopsy to aid in diagnosis.  #Age related screenings - UTD  -Patient will RTC when work up is complete.  Patient expressed understanding of the recommended workup and is agreeable to move forward.   All questions were answered. The patient knows to call the clinic with any problems, questions or concerns.  Shared visit with Dr. Autumn.  Orders Placed This Encounter  Procedures   IR US  LIVER BIOPSY    Okay for [place]    Standing Status:   Future    Expiration Date:   07/21/2025    Reason for Exam (SYMPTOM  OR DIAGNOSIS REQUIRED):   [facility] pt. liver lesion, concern for malignancy    Preferred Imaging Location?:   [facility]   AFP tumor marker    Standing Status:   Future    Number of Occurrences:   1    Expected Date:   07/21/2024    Expiration Date:   07/21/2025   Cancer antigen 19-9    Standing Status:   Future    Number of Occurrences:   1    Expected Date:   07/21/2024    Expiration Date:   07/21/2025   CBC with Differential (Cancer Center  Only)    Standing Status:   Future    Number of Occurrences:   1    Expected Date:   07/21/2024    Expiration Date:   07/21/2025   CEA (Access)    Standing Status:   Future    Number of Occurrences:   1    Expected Date:   07/21/2024    Expiration Date:   07/21/2025   CMP (Cancer Center only)    Standing Status:   Future    Number of Occurrences:   1    Expected Date:   07/21/2024    Expiration Date:   07/21/2025      I have spent a  total of 60 minutes minutes of face-to-face and non-face-to-face time, preparing to see the patient, obtaining and/or reviewing separately obtained history, performing a medically appropriate examination, counseling and educating the patient, ordering medications/tests/procedures, referring and communicating with other health care professionals, documenting clinical information in the electronic health record, independently interpreting results and communicating results to the patient, and care coordination.   Reggie Welge Walisiewicz PA-C Department of Hematology/Oncology [facility] at [facility] Phone: [phone]

## 2024-07-21 NOTE — Patient Instructions (Signed)
 Diagnostic Clinic Office Visit Discharge Information and Instructions  Thank you for choosing [facility] for your healthcare needs.  Below is a summary of today's discussion, along with our contact information and an outline of what to expect next.  Reason for Visit:  liver lesion  Proposed Diagnostic Care Plan: Labs collected today Liver biopsy ordered. The Interventional Radiology team will review your CT and MRI scans then call you to schedule the biopsy.    What to Expect: - Generally, when lab tests are ordered the results can take up to 1 week for results to be available.  At that point, we will contact you to discuss your results with you.  Unless there is a critical result, we will typically wait for all of your lab results to be available before contacting you. - If a biopsy is part of your Care Plan, those results can take on average 7-10 days to result.  Once results are available, we will contact you to discuss your pathology results and any next steps. - If you have additional imaging ordered, such as a CT Scan, MRI, Ultrasound, Bone Scan, or PET scan, your imaging will need to be authorized then scheduled with the earliest available appointment.  You may be asked to travel to another hospital within [facility] who has a sooner availability, please consider doing so if asked. - If you use MyChart, your results will be available to you in the MyChart portal.  Your provider will be in touch with you as soon as all of your results are available to be discussed.  Your Diagnostic Clinic Provider:  Bridgit Eynon Walisiewicz PA-C and Dr. Autumn Your Diagnostic Navigator:  Colene Raider RN, office number [phone]  If you or your caregiver have number blocking on your cell phones, please ensure the cancer center's numbers are not blocked.  If you are not a registered MyChart user, please consider enrolling in MyChart to receive your test results and visit notes.  You can also access  your discharge instructions electronically.  MyChart also gives you an electronic means to communicate with your Care Team instead of needing to call in to the cancer center.  We appreciate you trusting us  with your healthcare and look forward to partnering with you as we work to uncover what your potential diagnosis may be.  Please do not hesitate to reach out at any point with questions or concerns.

## 2024-07-22 LAB — AFP TUMOR MARKER: AFP, Serum, Tumor Marker: 7.5 ng/mL (ref 0.0–8.7)

## 2024-07-22 LAB — CANCER ANTIGEN 19-9: CA 19-9: 64 U/mL — ABNORMAL HIGH (ref 0–35)

## 2024-07-24 ENCOUNTER — Telehealth: Payer: Self-pay | Admitting: Physician Assistant

## 2024-07-24 ENCOUNTER — Other Ambulatory Visit: Payer: Self-pay | Admitting: Radiology

## 2024-07-24 ENCOUNTER — Encounter: Payer: Self-pay | Admitting: Medical Oncology

## 2024-07-24 DIAGNOSIS — K769 Liver disease, unspecified: Secondary | ICD-10-CM

## 2024-07-24 NOTE — Telephone Encounter (Signed)
 I notified Nicole Holt by phone regarding lab results. Lab work shows elevated CA 19-9 to 64 and elevated liver enzymes, alk phos, and t. Bili. Labs are similar when compared to recent testing on 07/13/24. Patient agreeable with next step in [facility] work up for the liver biopsy. I will follow up once I have those results. All of patient's questions were answered and she expressed understanding of the plan provided.

## 2024-07-25 ENCOUNTER — Other Ambulatory Visit (HOSPITAL_COMMUNITY): Payer: Self-pay | Admitting: Radiology

## 2024-07-25 NOTE — H&P (Signed)
 Chief Complaint: Patient was seen in consultation today for liver lesions, with consideration for biopsy.  Referring Provider(s): Ms. Kaitlyn Waslisiewicz, PA-C   Supervising Physician: Jennefer Rover  Patient Status: [facility] - Out-pt  Patient is Full Code  History of Present Illness: Nicole Holt is a 83 y.o. female  with PMHx notable for liver lesions concerning for metastasis, HTN, HLD, OSA, upper airway resistance syndrome, benign positional vertigo, essential tremor, RLS, GERD, migraines, hypertensive retinopathy, glaucoma, hearing loss, memory retention disorder, and others as delineated below.  Per Ms. Nieves' progress note date 9/19: [..] On review of the previous records patient presented for comprehensive physical exam with PCP Lucie Buttner PA-C on 07/04/24. At that visit she reported bowel changes with constipation and lower abdominal pain so CT AP was ordered. CT AP performed on 07/11/24 was showing a 7.5 x 8.2 cm heterogeneous lesion in the central liver. MR liver was subsequently performed on 07/15/24 showing large mass situated in the central liver closely abutting the IVC and centered in hepatic segment IV, measuring 8.3 x 6.6 cm. No evidence of lymphadenopathy or metastatic disease in the abdomen. 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   [...] Liver lesion - Reviewed CT AP and MR liver findings with patient and spouse. --Differentials include HCC, intrahepatic cholangiocarcinoma versus metastatic lesion. 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  - Will order labs today including CBC, CMP, AFP, CEA, CA 19-9. - Will order IR US  liver biopsy to aid in diagnosis.   Interventional Radiology was requested for liver lesion biopsy. Request was reviewed and approved by Dr. Luverne. Patient is scheduled for same in IR today.   Patient is alert and laying in bed, calm. Husband is at bedside. Patient is  currently without any significant complaints.  Patient denies any fevers, headache, chest pain, SOB, cough, abdominal pain, nausea, vomiting or bleeding.    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    Osteoporosis    Sleep apnea    Tinnitus     Past Surgical History:  Procedure Laterality Date   AIR/FLUID EXCHANGE Right 09/12/2020   Procedure: AIR/FLUID EXCHANGE;  Surgeon: Valdemar Rogue, MD;  Location: [facility] OR;  Service: Ophthalmology;  Laterality: Right;   CATARACT EXTRACTION Bilateral OD: 10.25.21 OS: 12.2015   Dr. Cyrus   cataract surgery     COSMETIC SURGERY     EYE SURGERY Bilateral    Cat Sx - Dr. Cyrus   EYE SURGERY Right 09/12/2020   PPV/EL for retained lens material - Dr. Rogue Valdemar   PARS PLANA VITRECTOMY Right 09/12/2020   Procedure: PARS PLANA VITRECTOMY WITH 23 GAUGE;  Surgeon: Valdemar Rogue, MD;  Location: [facility] OR;  Service: Ophthalmology;  Laterality: Right;   PHOTOCOAGULATION WITH LASER Right 09/12/2020   Procedure: PHOTOCOAGULATION WITH LASER;  Surgeon: Valdemar Rogue, MD;  Location: [facility] OR;  Service: Ophthalmology;  Laterality: Right;   REMOVAL RETAINED LENS Right 09/12/2020  Procedure: REMOVAL RETAINED LENS;  Surgeon: Valdemar Rogue, MD;  Location[facility] OR;  Service: Ophthalmology;  Laterality: Right;    Allergies: Codeine, Estrogens conjugated, and Paroxetine hcl  Medications: Prior to Admission medications   Medication Sig Start Date End Date Taking? Authorizing Provider  busPIRone  (BUSPAR ) 5 MG tablet Take 1 tablet (5 mg total) by mouth 2 (two) times daily as needed (anxiety).  07/04/24   Job Lukes, PA  diazepam  (VALIUM ) 5 MG tablet Take 1 tablet (5 mg total) by mouth every 6 (six) hours as needed. for anxiety 07/04/24   Job Lukes, PA  fluticasone  (FLONASE ) 50 MCG/ACT nasal spray Use 1 spray(s) in each nostril once daily 09/07/23   Job Lukes, PA  latanoprost (XALATAN) 0.005 % ophthalmic solution  05/16/21   [provider]  OVER THE COUNTER MEDICATION Take 10 drops by mouth daily in the afternoon. ConcenTrace    [provider]  polyethylene glycol (MIRALAX / GLYCOLAX) 17 g packet Take 17 g by mouth daily as needed.    [provider]  triamcinolone  cream (KENALOG ) 0.1 % Apply to affected area 1-2 times daily 11/24/23   Job Lukes, PA     Family History  Problem Relation Age of Onset   Osteoporosis Mother    Stroke Mother    Hyperlipidemia Mother    Hypertension Mother    Kidney disease Father    Congestive Heart Failure Father    Heart disease Father    Hyperlipidemia Father    Stroke Sister    Parkinson's disease Sister    Heart disease Sister    Heart disease Maternal Grandmother    Gout Maternal Grandmother    Stroke Maternal Grandfather    Stroke Paternal Grandmother    Thrombosis Paternal Grandfather    Early death Paternal Grandfather    Hypertension Brother    Diabetes Brother    Arthritis/Rheumatoid Other    Parkinson's disease Other    Heart disease Other    Obesity Daughter    Breast cancer Neg Hx     Social History   Socioeconomic History   Marital status: Married    Spouse name: Ozell   Number of children: 2   Years of education: 16   Highest education level: Some college, no degree  Occupational History   Occupation: Retired  Tobacco Use   Smoking status: Former   Smokeless tobacco: Never   Tobacco comments:    Quit in 1987  Vaping Use   Vaping status: Never Used  Substance and Sexual Activity   Alcohol use: Not Currently    Alcohol/week: 12.0 standard drinks of alcohol     Types: 12 Glasses of wine per week    Comment: wine or alcohol daily   Drug use: No   Sexual activity: Yes    Birth control/protection: Post-menopausal  Other Topics Concern   Not on file  Social History Narrative   Patient is married Tatiana) and lives with her husband.Patient is retired.Patient has two children.Patient is right-handed.Patient has a college education.Patient drinks two cups of coffee daily.      Right handed    Drinks 2 cups per day    Social Drivers of Health   Financial Resource Strain: Low Risk  (11/23/2023)   Overall Financial Resource Strain (CARDIA)    Difficulty of Paying Living Expenses: Not hard at all  Food Insecurity: No Food Insecurity (07/21/2024)   Hunger Vital Sign    Worried About Running Out of Food in the Last  Year: Never true    Ran Out of Food in the Last Year: Never true  Transportation Needs: No Transportation Needs (07/21/2024)   PRAPARE - Administrator, Civil Service (Medical): No    Lack of Transportation (Non-Medical): No  Physical Activity: Insufficiently Active (11/23/2023)   Exercise Vital Sign    Days of Exercise per Week: 2 days    Minutes of Exercise per Session: 60 min  Stress: Stress Concern Present (11/23/2023)   [employer] of Occupational Health - Occupational Stress Questionnaire    Feeling of Stress : To some extent  Social Connections: Moderately Isolated (11/23/2023)   Social Connection and Isolation Panel    Frequency of Communication with Friends and Family: Once a week    Frequency of Social Gatherings with Friends and Family: More than three times a week    Attends Religious Services: Never    Database administrator or Organizations: No    Attends Banker Meetings: Never    Marital Status: Married     Review of Systems: A 12 point ROS discussed and pertinent positives are indicated in the HPI above.  All other systems are negative.  Vital Signs: BP (!) 149/69   Pulse 63   Temp  98.2 F (36.8 C) (Oral)   Resp 16   Ht 5' 2 (1.575 m)   Wt 170 lb (77.1 kg)   SpO2 99%   BMI 31.09 kg/m   Advance Care Plan: The advanced care place/surrogate decision maker was discussed at the time of visit and the patient did not wish to discuss or was not able to name a surrogate decision maker or provide an advance care plan.  Physical Exam Constitutional:      General: She is not in acute distress.    Appearance: Normal appearance.  HENT:     Mouth/Throat:     Mouth: Mucous membranes are dry.  Cardiovascular:     Rate and Rhythm: Normal rate and regular rhythm.     Pulses: Normal pulses.     Heart sounds: Normal heart sounds.  Pulmonary:     Effort: Pulmonary effort is normal.     Breath sounds: Normal breath sounds.  Abdominal:     General: Abdomen is flat. There is no distension.     Palpations: Abdomen is soft.     Tenderness: There is no abdominal tenderness.  Musculoskeletal:        General: Normal range of motion.  Skin:    General: Skin is warm and dry.  Neurological:     Mental Status: She is alert and oriented to person, place, and time.  Psychiatric:        Mood and Affect: Mood normal.        Behavior: Behavior normal.        Thought Content: Thought content normal.        Judgment: Judgment normal.     Imaging: MR LIVER W WO CONTRAST Result Date: 07/18/2024 CLINICAL DATA:  Abdominal mass, intra-abdominal neoplasm suspected large liver mass identified by ultrasound EXAM: MRI ABDOMEN WITHOUT AND WITH CONTRAST TECHNIQUE: Multiplanar multisequence MR imaging of the abdomen was performed both before and after the administration of intravenous contrast. CONTRAST:  8mL GADAVIST  GADOBUTROL  1 MMOL/ML IV SOLN COMPARISON:  Right upper quadrant ultrasound, 07/14/2024, CT abdomen pelvis, 07/11/2024 FINDINGS: Lower chest: No acute abnormality. Hepatobiliary: Large mass situated in the central liver closely abutting the IVC and centered in hepatic segment IV,  measuring  8.3 x 6.6 cm (series 6, image 13). Mass demonstrates heterogeneous early arterial hyperenhancement with some cystic or necrotic components inferiorly, with evidence of early washout and some suggestion of capsular enhancement on later contrast phases. Mass effect distorts and compresses the hepatic hilum with consequent intrahepatic biliary ductal dilatation. No gallstones, gallbladder wall thickening, or biliary dilatation. Pancreas: Unremarkable. No pancreatic ductal dilatation or surrounding inflammatory changes. Spleen: Normal in size without significant abnormality. Adrenals/Urinary Tract: Adrenal glands are unremarkable. Kidneys are normal, without renal calculi, solid lesion, or hydronephrosis. Stomach/Bowel: Stomach is within normal limits. No evidence of bowel wall thickening, distention, or inflammatory changes. Vascular/Lymphatic: Aortic atherosclerosis. No enlarged abdominal lymph nodes. Other: No abdominal wall hernia or abnormality. No ascites. Musculoskeletal: No acute or significant osseous findings. IMPRESSION: 1. Large mass situated in the central liver closely abutting the IVC and centered in hepatic segment IV, measuring 8.3 x 6.6 cm. Mass demonstrates heterogeneous early arterial hyperenhancement with some cystic or necrotic components inferiorly, with evidence of early washout and some suggestion of capsular enhancement on later contrast phases. Mass effect distorts and compresses the hepatic hilum with consequent intrahepatic biliary ductal dilatation. 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 In the absence of cirrhotic liver morphology, a large metastasis or less likely a large focal nodular hyperplasia would remain differential considerations. 2. No evidence of lymphadenopathy or metastatic disease in the abdomen. Electronically Signed   By: Marolyn JONETTA Jaksch M.D.   On: 07/18/2024 07:32   CT ABDOMEN PELVIS W CONTRAST Result Date: 07/14/2024 CLINICAL DATA:   Abdominal pain, acute, nonlocalized. EXAM: CT ABDOMEN AND PELVIS WITH CONTRAST TECHNIQUE: Multidetector CT imaging of the abdomen and pelvis was performed using the standard protocol following bolus administration of intravenous contrast. RADIATION DOSE REDUCTION: This exam was performed according to the departmental dose-optimization program which includes automated exposure control, adjustment of the mA and/or kV according to patient size and/or use of iterative reconstruction technique. CONTRAST:  100mL ISOVUE -300 IOPAMIDOL  (ISOVUE -300) INJECTION 61% COMPARISON:  None Available. FINDINGS: Lower chest: There are patchy atelectatic changes in the visualized lung bases. No overt consolidation. No pleural effusion. The heart is normal in size. No pericardial effusion. Hepatobiliary: The liver is normal in size. Non-cirrhotic configuration. There is a 7.5 by 8.2 cm heterogeneous mixed slightly hyperattenuating and hypoattenuating lesion in the central liver. Lesion exhibit majority of the cystic component in the caudate lobe along the inferomedial aspect of the lesion. This is indeterminate on the basis of this exam. Further evaluation with multiphasic contrast-enhanced MRI abdomen as per hepatic mass protocol is recommended. The lesion causes mild central intrahepatic bile duct dilation. The portal vein tributaries are stressed and mildly compressed however, no discrete thrombosis seen. No calcified gallstones. Normal gallbladder wall thickness. No pericholecystic inflammatory changes. Pancreas: Unremarkable. No pancreatic ductal dilatation or surrounding inflammatory changes. Spleen: Within normal limits. No focal lesion. Adrenals/Urinary Tract: Adrenal glands are unremarkable. No suspicious renal mass. No hydronephrosis. No renal or ureteric calculi. Urinary bladder is under distended, precluding optimal assessment. However, no large mass or stones identified. No perivesical fat stranding. Stomach/Bowel: There is a  small sliding hiatal hernia. No disproportionate dilation of the small or large bowel loops. No evidence of abnormal bowel wall thickening or inflammatory changes. The appendix at the base is unremarkable. However, there is fluid distention of the distal appendix measuring up to 1.7 cm in diameter. No abnormal wall thickening. No surrounding fat stranding. No calcifications. Findings favor appendiceal mucocele. Surgical consultation is recommended. Vascular/Lymphatic: No  ascites or pneumoperitoneum. No abdominal or pelvic lymphadenopathy, by size criteria. No aneurysmal dilation of the major abdominal arteries. There are mild peripheral atherosclerotic vascular calcifications of the aorta and its major branches. Reproductive: Not well evaluated on the CT scan exam. However, having said that normal-size anteverted uterus noted. There is a well-circumscribed 3.4 x 3.9 cm hypoattenuating structure in the left adnexa, most likely ovarian in etiology. This may represent a senescent ovarian cyst. Follow-up ultrasound is recommended in 1 year to document stability. No large right adnexal mass seen. Other: There are fat containing umbilical and bilateral inguinal hernias. The soft tissues and abdominal wall are otherwise unremarkable. Musculoskeletal: No suspicious osseous lesions. There are mild multilevel degenerative changes in the visualized spine. IMPRESSION: 1. There is a 7.5 x 8.2 cm heterogeneous lesion in the central liver, which is indeterminate on the basis of this exam. Further evaluation with multiphasic contrast-enhanced MRI abdomen as per hepatic mass protocol is recommended. 2. There is fluid distention of the distal appendix measuring up to 1.7 cm in diameter. No abnormal wall thickening or surrounding fat stranding. Findings favor appendiceal mucocele. Surgical consultation is recommended. 3. There is a well-circumscribed 3.4 x 3.9 cm hypoattenuating structure in the left adnexa, most likely ovarian in  etiology. This may represent a senescent ovarian cyst. Follow-up ultrasound is recommended in 1 year to document stability. 4. Multiple other nonacute observations, as described above. Aortic Atherosclerosis (ICD10-I70.0). Electronically Signed   By: Ree Molt M.D.   On: 07/14/2024 10:05   US  Abdomen Limited RUQ (LIVER/GB) Result Date: 07/14/2024 CLINICAL DATA:  elevated liver labs. EXAM: ULTRASOUND ABDOMEN LIMITED RIGHT UPPER QUADRANT COMPARISON:  None Available. FINDINGS: Gallbladder: Physiologically distended gallbladder containing moderate volume dependent sludge. No abnormal wall thickening. No pericholecystic free fluid. Sonographic Murphy's sign was negative as per the technologist. Common bile duct: Diameter: 2.8 mm.  No intrahepatic bile duct dilation. Liver: Heterogeneous liver echotexture and mildly increased echogenicity. There is an approximately 7.3 x 9.7 cm solid mildly hyperechoic vascular mass in the right lobe, not well evaluated on the current exam. Further evaluation with multiphasic contrast-enhanced MRI abdomen as per hepatic mass protocol is recommended. Portal vein is patent on color Doppler imaging with normal direction of blood flow towards the liver. Other: None. IMPRESSION: 1. There is an approximately 7.3 x 9.7 cm solid mildly hyperechoic vascular mass in the right hepatic lobe, not well evaluated on the current exam. Further evaluation with multiphasic contrast-enhanced MRI abdomen as per hepatic mass protocol is recommended. 2. Moderate volume gallbladder sludge without imaging signs of acute cholecystitis. 3. Otherwise unremarkable exam. Electronically Signed   By: Ree Molt M.D.   On: 07/14/2024 09:57    Labs:  CBC: Recent Labs    11/24/23 1207 07/04/24 1209 07/21/24 1333 07/26/24 0728  WBC 5.0 6.4 5.5 6.7  HGB 14.8 13.8 14.7 15.3*  HCT 43.7 41.8 43.8 45.9  PLT 249.0 264.0 286 305    COAGS: Recent Labs    07/13/24 1038 07/26/24 0728  INR 1.1* 1.0     BMP: Recent Labs    11/24/23 1207 07/04/24 1209 07/21/24 1333  NA 138 139 138  K 4.2 4.5 5.1  CL 101 101 101  CO2 29 23 29   GLUCOSE 103* 119* 107*  BUN 14 15 14   CALCIUM  9.7 9.4 10.3  CREATININE 0.92 0.99 0.98  GFRNONAA  --   --  58*    LIVER FUNCTION TESTS: Recent Labs    11/24/23 1207 07/04/24 1209  07/13/24 1038 07/21/24 1333  BILITOT 0.6 2.4* 2.4* 1.6*  AST 21 229* 160* 175*  ALT 18 376* 271* 242*  ALKPHOS 59 520* 495* 511*  PROT 7.5 7.3 7.4 8.0  ALBUMIN 4.5 4.3 4.2 4.6    TUMOR MARKERS: Recent Labs    07/21/24 1333  CEA 2.00    Assessment and Plan: Per Ms. Nieves' progress note date 9/19: [...] Liver lesion - Reviewed CT AP and MR liver findings with patient and spouse. --Differentials include HCC, intrahepatic cholangiocarcinoma versus metastatic lesion. 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  - Will order labs today including CBC, CMP, AFP, CEA, CA 19-9. - Will order IR US  liver biopsy to aid in diagnosis.  Patient presents for scheduled liver lesion biopsy in IR today.  Patient has been NPO since midnight.  All labs and medications are within acceptable parameters.  Allergies reviewed: Codeine (nausea), estrogen conjugates, and Paroxetine.  Risks and benefits of liver lesion biopsy was discussed with the patient and/or patient's family including, but not limited to bleeding, infection, damage to adjacent structures or low yield requiring additional tests.  All of the questions were answered and there is agreement to proceed.  Consent signed and in chart.    Thank you for allowing our service to participate in PAMMIE CHIRINO 's care.  Electronically Signed: Carlin DELENA Griffon, PA-C   07/26/2024, 8:28 AM      I spent a total of 30 Minutes in face to face in clinical consultation, greater than 50% of which was counseling/coordinating care for liver lesions, with consideration for biopsy.

## 2024-07-26 ENCOUNTER — Ambulatory Visit (HOSPITAL_COMMUNITY)
Admission: RE | Admit: 2024-07-26 | Discharge: 2024-07-26 | Disposition: A | Discharge status: Home / Self Care | Source: Ambulatory Visit | Attending: Physician Assistant | Admitting: Physician Assistant

## 2024-07-26 ENCOUNTER — Other Ambulatory Visit: Payer: Self-pay

## 2024-07-26 DIAGNOSIS — E785 Hyperlipidemia, unspecified: Secondary | ICD-10-CM | POA: Insufficient documentation

## 2024-07-26 DIAGNOSIS — G43909 Migraine, unspecified, not intractable, without status migrainosus: Secondary | ICD-10-CM | POA: Insufficient documentation

## 2024-07-26 DIAGNOSIS — K219 Gastro-esophageal reflux disease without esophagitis: Secondary | ICD-10-CM | POA: Diagnosis not present

## 2024-07-26 DIAGNOSIS — H409 Unspecified glaucoma: Secondary | ICD-10-CM | POA: Insufficient documentation

## 2024-07-26 DIAGNOSIS — H9190 Unspecified hearing loss, unspecified ear: Secondary | ICD-10-CM | POA: Insufficient documentation

## 2024-07-26 DIAGNOSIS — I10 Essential (primary) hypertension: Secondary | ICD-10-CM | POA: Diagnosis not present

## 2024-07-26 DIAGNOSIS — G2581 Restless legs syndrome: Secondary | ICD-10-CM | POA: Insufficient documentation

## 2024-07-26 DIAGNOSIS — G4733 Obstructive sleep apnea (adult) (pediatric): Secondary | ICD-10-CM | POA: Diagnosis not present

## 2024-07-26 DIAGNOSIS — K769 Liver disease, unspecified: Secondary | ICD-10-CM | POA: Insufficient documentation

## 2024-07-26 DIAGNOSIS — R160 Hepatomegaly, not elsewhere classified: Secondary | ICD-10-CM | POA: Diagnosis not present

## 2024-07-26 DIAGNOSIS — D3A8 Other benign neuroendocrine tumors: Secondary | ICD-10-CM | POA: Insufficient documentation

## 2024-07-26 LAB — CBC
HCT: 45.9 % (ref 36.0–46.0)
Hemoglobin: 15.3 g/dL — ABNORMAL HIGH (ref 12.0–15.0)
MCH: 31.5 pg (ref 26.0–34.0)
MCHC: 33.3 g/dL (ref 30.0–36.0)
MCV: 94.6 fL (ref 80.0–100.0)
Platelets: 305 K/uL (ref 150–400)
RBC: 4.85 MIL/uL (ref 3.87–5.11)
RDW: 14.8 % (ref 11.5–15.5)
WBC: 6.7 K/uL (ref 4.0–10.5)
nRBC: 0 % (ref 0.0–0.2)

## 2024-07-26 LAB — PROTIME-INR
INR: 1 (ref 0.8–1.2)
Prothrombin Time: 13.5 s (ref 11.4–15.2)

## 2024-07-26 MED ORDER — MIDAZOLAM HCL 2 MG/2ML IJ SOLN
INTRAMUSCULAR | Status: AC
  Filled 2024-07-26: qty 2

## 2024-07-26 MED ORDER — SODIUM CHLORIDE 0.9 % IV SOLN: INTRAVENOUS | Status: DC

## 2024-07-26 MED ORDER — HYDROMORPHONE HCL 1 MG/ML IJ SOLN: 0.5000 mg | Freq: Once | INTRAMUSCULAR | Status: DC

## 2024-07-26 MED ORDER — FENTANYL CITRATE (PF) 100 MCG/2ML IJ SOLN
INTRAMUSCULAR | Status: AC
  Filled 2024-07-26: qty 2

## 2024-07-26 MED ORDER — MIDAZOLAM HCL 2 MG/2ML IJ SOLN
INTRAMUSCULAR | Status: AC | PRN
  Administered 2024-07-26: .5 mg via INTRAVENOUS
  Administered 2024-07-26: 1 mg via INTRAVENOUS

## 2024-07-26 MED ORDER — GELATIN ABSORBABLE 12-7 MM EX MISC: 1.0000 | Freq: Once | CUTANEOUS | Status: DC

## 2024-07-26 MED ORDER — OXYCODONE HCL 5 MG PO TABS
5.0000 mg | ORAL_TABLET | Freq: Once | ORAL | Status: AC
  Administered 2024-07-26: 5 mg via ORAL
  Filled 2024-07-26: qty 1

## 2024-07-26 MED ORDER — LIDOCAINE HCL (PF) 1 % IJ SOLN: 10.0000 mL | Freq: Once | INTRAMUSCULAR | Status: DC

## 2024-07-26 MED ORDER — FENTANYL CITRATE (PF) 100 MCG/2ML IJ SOLN
INTRAMUSCULAR | Status: AC | PRN
  Administered 2024-07-26: 25 ug via INTRAVENOUS
  Administered 2024-07-26: 50 ug via INTRAVENOUS

## 2024-07-26 NOTE — Procedures (Signed)
 Interventional Radiology Procedure Note  Procedure: Ultrasound guided liver mass biopsy   Findings: Please refer to procedural dictation for full description. 18 ga core x2 from central liver mass.  Gelfoam slurry needle track embolization.  Complications: None immediate  Estimated Blood Loss: < 5 mL  Recommendations: Strict 3 hour bedrest. Follow Pathology results.   Ester Sides, MD

## 2024-07-26 NOTE — Progress Notes (Signed)
 Patient and family member given discharge education. No questions at this time.

## 2024-07-28 ENCOUNTER — Telehealth: Payer: Self-pay | Admitting: Physician Assistant

## 2024-07-28 LAB — SURGICAL PATHOLOGY

## 2024-07-28 NOTE — Telephone Encounter (Signed)
 I notified Nicole Holt and spouse by phone regarding liver biopsy results. 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 Patient is scheduled to follow up with Dr. Autumn to finalize treatment recommendations on 07/31/24. All of patient's questions were answered and they expressed understanding of the plan provided.

## 2024-07-31 ENCOUNTER — Encounter: Payer: Self-pay | Admitting: Medical Oncology

## 2024-07-31 ENCOUNTER — Inpatient Hospital Stay: Admitting: Oncology

## 2024-07-31 ENCOUNTER — Encounter: Payer: Self-pay | Admitting: Oncology

## 2024-07-31 ENCOUNTER — Telehealth: Payer: Self-pay | Admitting: Oncology

## 2024-07-31 ENCOUNTER — Inpatient Hospital Stay

## 2024-07-31 ENCOUNTER — Encounter: Payer: Self-pay | Admitting: *Deleted

## 2024-07-31 ENCOUNTER — Other Ambulatory Visit: Payer: Self-pay | Admitting: *Deleted

## 2024-07-31 VITALS — BP 121/51 | HR 83 | Temp 97.5°F | Resp 18 | Ht 62.0 in | Wt 164.9 lb

## 2024-07-31 DIAGNOSIS — K3189 Other diseases of stomach and duodenum: Secondary | ICD-10-CM | POA: Diagnosis not present

## 2024-07-31 DIAGNOSIS — C7A8 Other malignant neuroendocrine tumors: Secondary | ICD-10-CM

## 2024-07-31 DIAGNOSIS — R7401 Elevation of levels of liver transaminase levels: Secondary | ICD-10-CM

## 2024-07-31 DIAGNOSIS — C7A00 Malignant carcinoid tumor of unspecified site: Secondary | ICD-10-CM | POA: Diagnosis not present

## 2024-07-31 DIAGNOSIS — C7B02 Secondary carcinoid tumors of liver: Secondary | ICD-10-CM

## 2024-07-31 DIAGNOSIS — Z87891 Personal history of nicotine dependence: Secondary | ICD-10-CM

## 2024-07-31 DIAGNOSIS — G893 Neoplasm related pain (acute) (chronic): Secondary | ICD-10-CM | POA: Insufficient documentation

## 2024-07-31 DIAGNOSIS — K5900 Constipation, unspecified: Secondary | ICD-10-CM | POA: Diagnosis not present

## 2024-07-31 DIAGNOSIS — R7989 Other specified abnormal findings of blood chemistry: Secondary | ICD-10-CM | POA: Diagnosis not present

## 2024-07-31 DIAGNOSIS — M810 Age-related osteoporosis without current pathological fracture: Secondary | ICD-10-CM | POA: Diagnosis not present

## 2024-07-31 LAB — CBC WITH DIFFERENTIAL (CANCER CENTER ONLY)
Abs Immature Granulocytes: 0.04 K/uL (ref 0.00–0.07)
Basophils Absolute: 0.1 K/uL (ref 0.0–0.1)
Basophils Relative: 1 %
Eosinophils Absolute: 0 K/uL (ref 0.0–0.5)
Eosinophils Relative: 0 %
HCT: 42.7 % (ref 36.0–46.0)
Hemoglobin: 14.5 g/dL (ref 12.0–15.0)
Immature Granulocytes: 1 %
Lymphocytes Relative: 16 %
Lymphs Abs: 1.2 K/uL (ref 0.7–4.0)
MCH: 31.7 pg (ref 26.0–34.0)
MCHC: 34 g/dL (ref 30.0–36.0)
MCV: 93.4 fL (ref 80.0–100.0)
Monocytes Absolute: 0.6 K/uL (ref 0.1–1.0)
Monocytes Relative: 8 %
Neutro Abs: 5.6 K/uL (ref 1.7–7.7)
Neutrophils Relative %: 74 %
Platelet Count: 281 K/uL (ref 150–400)
RBC: 4.57 MIL/uL (ref 3.87–5.11)
RDW: 14.6 % (ref 11.5–15.5)
WBC Count: 7.4 K/uL (ref 4.0–10.5)
nRBC: 0 % (ref 0.0–0.2)

## 2024-07-31 LAB — HEPATIC FUNCTION PANEL
ALT: 106 U/L — ABNORMAL HIGH (ref 0–44)
AST: 72 U/L — ABNORMAL HIGH (ref 15–41)
Albumin: 4.4 g/dL (ref 3.5–5.0)
Alkaline Phosphatase: 449 U/L — ABNORMAL HIGH (ref 38–126)
Bilirubin, Direct: 1.3 mg/dL — ABNORMAL HIGH (ref 0.0–0.2)
Indirect Bilirubin: 0.8 mg/dL (ref 0.3–0.9)
Total Bilirubin: 2.1 mg/dL — ABNORMAL HIGH (ref 0.0–1.2)
Total Protein: 7.8 g/dL (ref 6.5–8.1)

## 2024-07-31 LAB — BASIC METABOLIC PANEL - CANCER CENTER ONLY
Anion gap: 12 (ref 5–15)
BUN: 14 mg/dL (ref 8–23)
CO2: 27 mmol/L (ref 22–32)
Calcium: 10.7 mg/dL — ABNORMAL HIGH (ref 8.9–10.3)
Chloride: 99 mmol/L (ref 98–111)
Creatinine: 1.06 mg/dL — ABNORMAL HIGH (ref 0.44–1.00)
GFR, Estimated: 52 mL/min — ABNORMAL LOW (ref >60)
Glucose, Bld: 115 mg/dL — ABNORMAL HIGH (ref 70–99)
Potassium: 4.6 mmol/L (ref 3.5–5.1)
Sodium: 139 mmol/L (ref 135–145)

## 2024-07-31 LAB — LACTATE DEHYDROGENASE: LDH: 216 U/L — ABNORMAL HIGH (ref 98–192)

## 2024-07-31 MED ORDER — TRAMADOL HCL 50 MG PO TABS: 50.0000 mg | ORAL_TABLET | Freq: Three times a day (TID) | ORAL | 0 refills | Status: AC | PRN

## 2024-07-31 NOTE — Progress Notes (Signed)
 PATIENT NAVIGATOR PROGRESS NOTE  Name: Nicole Holt Date: 07/31/2024 MRN: [id number]  DOB: 12-Jun-1941   Reason for visit:  New patient appt  Comments:  Met with [name] after appt with Dr Autumn  Referral to Sw  Referral to Nutrition Scheduling PET dototate scan within 1-2 weeks Given Journeys notebook with disease specific information Given contact number to call with any questions    Time spent counseling/coordinating care: > 60 minutes

## 2024-07-31 NOTE — Assessment & Plan Note (Signed)
 Please review oncology history for additional details and timeline of events.  Ultrasound-guided biopsy of the liver mass on 07/26/2024 showed well-differentiated neuroendocrine tumor, WHO grade 2. Immunohistochemical stains show the cells were positive for CK AE1/AE3, synaptophysin, chromogranin A and PAX8. The cells are negative for TTF-1, CDX2, and HepPar1. Ki-67 proliferation index is approximately 16%.  Primary remains uncertain at this time.  Although neuroendocrine tumors are reported to originate from the liver, it is rare.  No carcinoid symptoms.  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  Bilirubin is better at 2.1, rest of the LFTs are also slightly better compared to prior.  Alkaline phosphatase is now 449.  Chromogranin A pending.  Plan to obtain PET dotatate scan.  Depending on the results, referral to surgery versus IR will be planned for intervention.  Given elevated LFTs and tumor burden, we will start her on octreotide injections as well, following the PET dotatate scan.  Tentatively arranged for injection in 2 weeks.

## 2024-07-31 NOTE — Assessment & Plan Note (Signed)
 Severe gastric pain, especially at night, not directly related to the liver tumor but may be referred or due to other gastrointestinal issues. - Prescribe tramadol for pain management, to be taken at night as needed.

## 2024-07-31 NOTE — Telephone Encounter (Signed)
 Patient has been scheduled for follow-up visit per 07/31/24 LOS.  LVM notifying pt of appt details, provided my direct number to pt if appt changes need to be made.

## 2024-07-31 NOTE — Progress Notes (Signed)
 Dototate PET scan order entered

## 2024-07-31 NOTE — Progress Notes (Signed)
 [facility]  ONCOLOGY CONSULT NOTE   PATIENT NAME: Nicole Holt   MR#: [id number] DOB: 1941-07-09  DATE OF SERVICE: 07/31/2024   REFERRING PROVIDER  Job Lukes, PA   Patient Care Team: Job Lukes, [place] as PCP - General (Physician Assistant) Gladis Inocente Coy, NP as Nurse Practitioner (Neurology) Cyrus Carwin, MD as Consulting Physician (Ophthalmology) Dohmeier, Dedra, MD as Consulting Physician (Neurology) [facility], [facility] Keri Pandora Cadet, [facility] as Pharmacist (Pharmacist) Autumn Millman, MD as Consulting Physician (Oncology)    CHIEF COMPLAINT/ PURPOSE OF CONSULTATION:   Newly diagnosed well-differentiated neuroendocrine tumor, 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  ASSESSMENT & PLAN:   Nicole Holt is a 83 y.o. 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  Well differentiated neuroendocrine tumor [facility]) Please review oncology history for additional details and timeline of events.  Ultrasound-guided biopsy of the liver mass on 07/26/2024 showed well-differentiated neuroendocrine tumor, WHO grade 2. Immunohistochemical stains show the cells were positive for CK AE1/AE3, synaptophysin, chromogranin A and PAX8. The cells are negative for TTF-1, CDX2, and HepPar1. Ki-67 proliferation index is approximately 16%.  Primary remains uncertain at this time.  Although neuroendocrine tumors are reported to originate from the liver, it is rare.  No carcinoid symptoms.  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  Bilirubin is better at 2.1, rest of the LFTs are also slightly better compared to prior.  Alkaline phosphatase  is now 449.  Chromogranin A pending.  Plan to obtain PET dotatate scan.  Depending on the results, referral to surgery versus IR will be planned for intervention.  Given elevated LFTs and tumor burden, we will start her on octreotide injections as well, following the PET dotatate scan.  Tentatively arranged for injection in 2 weeks.  Cancer associated pain Severe gastric pain, especially at night, not directly related to the liver tumor but may be referred or due to other gastrointestinal issues. - Prescribe tramadol for pain management, to be taken at night as needed.   I reviewed lab results and outside records for this visit and discussed relevant results with the patient. Diagnosis, plan of care and treatment options were also discussed in detail with the patient. Opportunity provided to ask questions and answers provided to her apparent satisfaction. Provided instructions to call our clinic with any problems, questions or concerns prior to return visit. I recommended to continue follow-up with PCP and sub-specialists. She verbalized understanding and agreed with the plan. No barriers to learning was detected.  NCCN guidelines have been consulted in the planning of this patient's care.  Millman Autumn, MD  07/31/2024 4:22 PM  [facility] - A DEPT OF [name] [id number] [place] [address] Dept: [phone] Dept Fax: [phone]   HISTORY OF PRESENTING ILLNESS:   I have reviewed her chart and materials related to her cancer extensively and collaborated history with the patient. Summary of oncologic history is as follows:  ONCOLOGY HISTORY:  Patient had a follow-up appointment with her PCP on 07/04/2024. At that visit she reported bowel changes with constipation and lower abdominal pain so CT AP was ordered. CT AP performed on 07/11/24 showed a 7.5 x 8.2 cm  heterogeneous lesion in the central liver. MR  liver was subsequently performed on 07/15/24 showing large mass situated in the central liver closely abutting the IVC and centered in hepatic segment IV, measuring 8.3 x 6.6 cm. No evidence of lymphadenopathy or metastatic disease in the abdomen. Mass effect distorts and compresses the hepatic hilum with consequent intrahepatic biliary ductal dilatation. 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  Hepatic function panel was collected 07/13/24 with results significant for elevated T bili 2.4, alkaline phosphatase 495, AST/ALT 160/271.   Ultrasound-guided biopsy of the liver mass on 07/26/2024 showed well-differentiated neuroendocrine tumor, WHO grade 2. Immunohistochemical stains show the cells were positive for CK AE1/AE3, synaptophysin, chromogranin A and PAX8. The cells are negative for TTF-1, CDX2, and HepPar1. Ki-67 proliferation index is approximately 16%.  The overall findings are consistent with a well-differentiated neuroendocrine tumor. The primary site could potentially be the pancreas, upper gastrointestinal tract, duodenum, or rectum, among others. Although the appendiceal region and gynecologic system (including carcinoid arising in teratoma) remain possible sites, they are considered less likely. The existence of a primary hepatic neuroendocrine tumor remains controversial; however, if no primary tumor is identified elsewhere, a primary hepatic origin should be considered. Correlation with radiological imaging is recommended for further evaluation.   Primary remains uncertain at this time.  Although neuroendocrine tumors are reported to originate from the liver, it is rare.  Plan to obtain PET dotatate scan.  Depending on the results, referral to surgery versus IR will be planned for intervention.  Given elevated  LFTs and tumor burden, we will start her on octreotide injections as well, following the PET dotatate scan.  INTERVAL HISTORY:  Discussed the use of AI scribe software for clinical note transcription with the patient, who gave verbal consent to proceed.  History of Present Illness TAMIYAH MOULIN is an 83 year old female with a neuroendocrine tumor who presents with abdominal pain and gastrointestinal symptoms.  She experiences gastric pain at night, describing it as her stomach swelling. She has adjusted her eating habits by consuming smaller amounts of food at a time, which has lessened the severity of the pain. Despite these changes, she continues to feel pain, particularly at night.  She has been experiencing constipation for several months, requiring the use of hot water  to initiate bowel movements. She uses Miralax and prune juice to aid in bowel movements, which have been difficult and resulted in 'almost white little balls.' A colonoscopy in 2023 showed no cancer but did reveal hemorrhoids as the source of previous blood in her stool.  Her liver function tests have shown elevated levels since September, with alkaline phosphatase, AST, ALT, and total bilirubin all increased. However, there has been a slight decrease in these levels over the past ten days. She associates an itching reaction after consuming wine with her liver condition.  She has a history of inflammation and pain affecting her legs and shoulders, previously attributed to arthritis. Recently, she notes a decrease in inflammation, allowing her to stand and walk without pain, although she previously experienced her legs giving way.  No diarrhea, wheezing, shortness of breath, dizziness, palpitations, or facial flushing. She mentions having rosacea. She manages her pain with ibuprofen and uses tramadol as needed, particularly at night.   MEDICAL HISTORY:  Past Medical History:  Diagnosis Date   Allergy    Anxiety  panic  attack   Depression    GERD (gastroesophageal reflux disease)    Glaucoma    Glaucoma    Hearing loss    History of basal cell cancer 08/19/2012   Removed from nose   Hyperlipidemia    Hypertensive retinopathy    OU   Migraines    ocular   OSA on CPAP 02/02/2014   PSG 09-27-2012 RDI 18.9 and AHi 9.8, Epworth 15 .titrated to only 5 cm water .    Osteoporosis    Sleep apnea    Tinnitus     SURGICAL HISTORY: Past Surgical History:  Procedure Laterality Date   AIR/FLUID EXCHANGE Right 09/12/2020   Procedure: AIR/FLUID EXCHANGE;  Surgeon: Valdemar Rogue, MD;  Location: [facility] OR;  Service: Ophthalmology;  Laterality: Right;   CATARACT EXTRACTION Bilateral OD: 10.25.21 OS: 12.2015   Dr. Cyrus   cataract surgery     COSMETIC SURGERY     EYE SURGERY Bilateral    Cat Sx - Dr. Cyrus   EYE SURGERY Right 09/12/2020   PPV/EL for retained lens material - Dr. Rogue Valdemar   PARS PLANA VITRECTOMY Right 09/12/2020   Procedure: PARS PLANA VITRECTOMY WITH 23 GAUGE;  Surgeon: Valdemar Rogue, MD;  Location: [facility] OR;  Service: Ophthalmology;  Laterality: Right;   PHOTOCOAGULATION WITH LASER Right 09/12/2020   Procedure: PHOTOCOAGULATION WITH LASER;  Surgeon: Valdemar Rogue, MD;  Location: [facility] OR;  Service: Ophthalmology;  Laterality: Right;   REMOVAL RETAINED LENS Right 09/12/2020   Procedure: REMOVAL RETAINED LENS;  Surgeon: Valdemar Rogue, MD;  Location: [facility] OR;  Service: Ophthalmology;  Laterality: Right;    SOCIAL HISTORY: She reports that she has quit smoking. She has never used smokeless tobacco. She reports that she does not currently use alcohol after a past usage of about 12.0 standard drinks of alcohol per week. She reports that she does not use drugs. Social History   Socioeconomic History   Marital status: Married    Spouse name: Ozell   Number of children: 2   Years of education: 16   Highest education level: Some college, no degree  Occupational History   Occupation: Retired   Tobacco Use   Smoking status: Former   Smokeless tobacco: Never   Tobacco comments:    Quit in 1987  Vaping Use   Vaping status: Never Used  Substance and Sexual Activity   Alcohol use: Not Currently    Alcohol/week: 12.0 standard drinks of alcohol    Types: 12 Glasses of wine per week    Comment: wine or alcohol daily   Drug use: No   Sexual activity: Yes    Birth control/protection: Post-menopausal  Other Topics Concern   Not on file  Social History Narrative   Patient is married Tatiana) and lives with her husband.Patient is retired.Patient has two children.Patient is right-handed.Patient has a college education.Patient drinks two cups of coffee daily.      Right handed    Drinks 2 cups per day    Social Drivers of Health   Financial Resource Strain: Low Risk  (11/23/2023)   Overall Financial Resource Strain (CARDIA)    Difficulty of Paying Living Expenses: Not hard at all  Food Insecurity: No Food Insecurity (07/31/2024)   Hunger Vital Sign    Worried About Running Out of Food in the Last Year: Never true    Ran Out of Food in the Last Year: Never true  Transportation Needs: No Transportation Needs (07/31/2024)   PRAPARE - Transportation  Lack of Transportation (Medical): No    Lack of Transportation (Non-Medical): No  Physical Activity: Insufficiently Active (11/23/2023)   Exercise Vital Sign    Days of Exercise per Week: 2 days    Minutes of Exercise per Session: 60 min  Stress: Stress Concern Present (11/23/2023)   [employer] of Occupational Health - Occupational Stress Questionnaire    Feeling of Stress : To some extent  Social Connections: Moderately Isolated (11/23/2023)   Social Connection and Isolation Panel    Frequency of Communication with Friends and Family: Once a week    Frequency of Social Gatherings with Friends and Family: More than three times a week    Attends Religious Services: Never    Database administrator or Organizations: No     Attends Banker Meetings: Never    Marital Status: Married  Catering manager Violence: Not At Risk (07/31/2024)   Humiliation, Afraid, Rape, and Kick questionnaire    Fear of Current or Ex-Partner: No    Emotionally Abused: No    Physically Abused: No    Sexually Abused: No    FAMILY HISTORY: Family History  Problem Relation Age of Onset   Osteoporosis Mother    Stroke Mother    Hyperlipidemia Mother    Hypertension Mother    Kidney disease Father    Congestive Heart Failure Father    Heart disease Father    Hyperlipidemia Father    Stroke Sister    Parkinson's disease Sister    Heart disease Sister    Heart disease Maternal Grandmother    Gout Maternal Grandmother    Stroke Maternal Grandfather    Stroke Paternal Grandmother    Thrombosis Paternal Grandfather    Early death Paternal Grandfather    Hypertension Brother    Diabetes Brother    Arthritis/Rheumatoid Other    Parkinson's disease Other    Heart disease Other    Obesity Daughter    Breast cancer Neg Hx     ALLERGIES:  She is allergic to codeine, estrogens conjugated, and paroxetine hcl.  MEDICATIONS:  Current Outpatient Medications  Medication Sig Dispense Refill   diazepam  (VALIUM ) 5 MG tablet Take 1 tablet (5 mg total) by mouth every 6 (six) hours as needed. for anxiety 30 tablet 0   fluticasone  (FLONASE ) 50 MCG/ACT nasal spray Use 1 spray(s) in each nostril once daily 16 g 0   latanoprost (XALATAN) 0.005 % ophthalmic solution      traMADol (ULTRAM) 50 MG tablet Take 1 tablet (50 mg total) by mouth every 8 (eight) hours as needed. 60 tablet 0   busPIRone  (BUSPAR ) 5 MG tablet Take 1 tablet (5 mg total) by mouth 2 (two) times daily as needed (anxiety). (Patient not taking: Reported on 07/31/2024) 30 tablet 1   No current facility-administered medications for this visit.    REVIEW OF SYSTEMS:    Review of Systems - Oncology  All other pertinent systems were reviewed with the patient and are  negative.  PHYSICAL EXAMINATION:   Onc Performance Status - 07/31/24 1056       ECOG Perf Status   ECOG Perf Status Ambulatory and capable of all selfcare but unable to carry out any work activities.  Up and about more than 50% of waking hours      KPS SCALE   KPS % SCORE Cares for self, unable to carry on normal activity or to do active work          Vitals:  07/31/24 1045  BP: (!) 121/51  Pulse: 83  Resp: 18  Temp: (!) 97.5 F (36.4 C)  SpO2: 95%   Filed Weights   07/31/24 1045  Weight: 164 lb 14.4 oz (74.8 kg)    Physical Exam Constitutional:      General: She is not in acute distress.    Appearance: Normal appearance.  HENT:     Head: Normocephalic and atraumatic.  Cardiovascular:     Rate and Rhythm: Normal rate.     Heart sounds: Normal heart sounds.  Pulmonary:     Effort: Pulmonary effort is normal. No respiratory distress.     Breath sounds: Normal breath sounds.  Abdominal:     General: There is no distension.     Palpations: Abdomen is soft. There is no mass.  Neurological:     General: No focal deficit present.     Mental Status: She is alert and oriented to person, place, and time.  Psychiatric:        Mood and Affect: Mood normal.        Behavior: Behavior normal.      LABORATORY DATA:   I have reviewed the data as listed.  Results for orders placed or performed in visit on 07/31/24  Hepatic function panel  Result Value Ref Range   Total Protein 7.8 6.5 - 8.1 g/dL   Albumin 4.4 3.5 - 5.0 g/dL   AST 72 (H) 15 - 41 U/L   ALT 106 (H) 0 - 44 U/L   Alkaline Phosphatase 449 (H) 38 - 126 U/L   Total Bilirubin 2.1 (H) 0.0 - 1.2 mg/dL   Bilirubin, Direct 1.3 (H) 0.0 - 0.2 mg/dL   Indirect Bilirubin 0.8 0.3 - 0.9 mg/dL  Basic Metabolic Panel - Cancer Center Only  Result Value Ref Range   Sodium 139 135 - 145 mmol/L   Potassium 4.6 3.5 - 5.1 mmol/L   Chloride 99 98 - 111 mmol/L   CO2 27 22 - 32 mmol/L   Glucose, Bld 115 (H) 70 - 99  mg/dL   BUN 14 8 - 23 mg/dL   Creatinine 8.93 (H) 9.55 - 1.00 mg/dL   Calcium  10.7 (H) 8.9 - 10.3 mg/dL   GFR, Estimated 52 (L) >60 mL/min   Anion gap 12 5 - 15  Lactate dehydrogenase  Result Value Ref Range   LDH 216 (H) 98 - 192 U/L  CBC with Differential (Cancer Center Only)  Result Value Ref Range   WBC Count 7.4 4.0 - 10.5 K/uL   RBC 4.57 3.87 - 5.11 MIL/uL   Hemoglobin 14.5 12.0 - 15.0 g/dL   HCT 57.2 63.9 - 53.9 %   MCV 93.4 80.0 - 100.0 fL   MCH 31.7 26.0 - 34.0 pg   MCHC 34.0 30.0 - 36.0 g/dL   RDW 85.3 88.4 - 84.4 %   Platelet Count 281 150 - 400 K/uL   nRBC 0.0 0.0 - 0.2 %   Neutrophils Relative % 74 %   Neutro Abs 5.6 1.7 - 7.7 K/uL   Lymphocytes Relative 16 %   Lymphs Abs 1.2 0.7 - 4.0 K/uL   Monocytes Relative 8 %   Monocytes Absolute 0.6 0.1 - 1.0 K/uL   Eosinophils Relative 0 %   Eosinophils Absolute 0.0 0.0 - 0.5 K/uL   Basophils Relative 1 %   Basophils Absolute 0.1 0.0 - 0.1 K/uL   Immature Granulocytes 1 %   Abs Immature Granulocytes 0.04 0.00 - 0.07 K/uL  Recent Labs    07/04/24 1209 07/13/24 1038 07/21/24 1333 07/31/24 1151  NA 139  --  138 139  K 4.5  --  5.1 4.6  CL 101  --  101 99  CO2 23  --  29 27  GLUCOSE 119*  --  107* 115*  BUN 15  --  14 14  CREATININE 0.99  --  0.98 1.06*  CALCIUM  9.4  --  10.3 10.7*  GFRNONAA  --   --  58* 52*  PROT 7.3 7.4 8.0 7.8  ALBUMIN 4.3 4.2 4.6 4.4  AST 229* 160* 175* 72*  ALT 376* 271* 242* 106*  ALKPHOS 520* 495* 511* 449*  BILITOT 2.4* 2.4* 1.6* 2.1*  BILIDIR  --  1.1*  --  1.3*  IBILI  --   --   --  0.8     RADIOGRAPHIC STUDIES:  I have personally reviewed the radiological images as listed and agree with the findings in the report.  US  BIOPSY (LIVER) Result Date: 07/26/2024 INDICATION: 83 year old female with indeterminate central liver mass. EXAM: ULTRASOUND GUIDED LIVER LESION BIOPSY COMPARISON:  None Available. MEDICATIONS: None ANESTHESIA/SEDATION: Fentanyl  75 mcg IV; Versed  1.5 mg  IV Total Moderate Sedation time:  11 minutes. The patient's level of consciousness and vital signs were monitored continuously by radiology nursing throughout the procedure under my direct supervision. COMPLICATIONS: None immediate. PROCEDURE: Informed written consent was obtained from the patient after a discussion of the risks, benefits and alternatives to treatment. The patient understands and consents the procedure. A timeout was performed prior to the initiation of the procedure. Ultrasound scanning was performed of the right upper abdominal quadrant demonstrates large central heterogeneously isoechoic hepatic mass. The liver mass was selected for biopsy and the procedure was planned. The right upper abdominal quadrant was prepped and draped in the usual sterile fashion. The overlying soft tissues were anesthetized with 1% lidocaine  with epinephrine . A 17 gauge, 6.8 cm co-axial needle was advanced into a peripheral aspect of the lesion. This was followed by 2 core biopsies with an 18 gauge core device under direct ultrasound guidance. The coaxial needle tract was embolized with a small amount of Gel-Foam slurry and superficial hemostasis was obtained with manual compression. Post procedural scanning was negative for definitive area of hemorrhage or additional complication. A dressing was placed. The patient tolerated the procedure well without immediate post procedural complication. IMPRESSION: Technically successful ultrasound guided core needle biopsy of central liver mass. Ester Sides, MD [facility] Radiology Electronically Signed   By: Ester Sides M.D.   On: 07/26/2024 12:08   MR LIVER W WO CONTRAST Result Date: 07/18/2024 CLINICAL DATA:  Abdominal mass, intra-abdominal neoplasm suspected large liver mass identified by ultrasound EXAM: MRI ABDOMEN WITHOUT AND WITH CONTRAST TECHNIQUE: Multiplanar multisequence MR imaging of the abdomen was performed  both before and after the administration of intravenous contrast. CONTRAST:  8mL GADAVIST  GADOBUTROL  1 MMOL/ML IV SOLN COMPARISON:  Right upper quadrant ultrasound, 07/14/2024, CT abdomen pelvis, 07/11/2024 FINDINGS: Lower chest: No acute abnormality. Hepatobiliary: Large mass situated in the central liver closely abutting the IVC and centered in hepatic segment IV, measuring 8.3 x 6.6 cm (series 6, image 13). Mass demonstrates heterogeneous early arterial hyperenhancement with some cystic or necrotic components inferiorly, with evidence of early washout and some suggestion of capsular enhancement on later contrast phases. Mass effect distorts and compresses the hepatic hilum with consequent intrahepatic biliary ductal dilatation. No gallstones, gallbladder wall thickening, or biliary  dilatation. Pancreas: Unremarkable. No pancreatic ductal dilatation or surrounding inflammatory changes. Spleen: Normal in size without significant abnormality. Adrenals/Urinary Tract: Adrenal glands are unremarkable. Kidneys are normal, without renal calculi, solid lesion, or hydronephrosis. Stomach/Bowel: Stomach is within normal limits. No evidence of bowel wall thickening, distention, or inflammatory changes. Vascular/Lymphatic: Aortic atherosclerosis. No enlarged abdominal lymph nodes. Other: No abdominal wall hernia or abnormality. No ascites. Musculoskeletal: No acute or significant osseous findings. IMPRESSION: 1. Large mass situated in the central liver closely abutting the IVC and centered in hepatic segment IV, measuring 8.3 x 6.6 cm. Mass demonstrates heterogeneous early arterial hyperenhancement with some cystic or necrotic components inferiorly, with evidence of early washout and some suggestion of capsular enhancement on later contrast phases. Mass effect distorts and compresses the hepatic hilum with consequent intrahepatic biliary ductal dilatation. 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 In  the absence of cirrhotic liver morphology, a large metastasis or less likely a large focal nodular hyperplasia would remain differential considerations. 2. No evidence of lymphadenopathy or metastatic disease in the abdomen. Electronically Signed   By: Marolyn JONETTA Jaksch M.D.   On: 07/18/2024 07:32   CT ABDOMEN PELVIS W CONTRAST Result Date: 07/14/2024 CLINICAL DATA:  Abdominal pain, acute, nonlocalized. EXAM: CT ABDOMEN AND PELVIS WITH CONTRAST TECHNIQUE: Multidetector CT imaging of the abdomen and pelvis was performed using the standard protocol following bolus administration of intravenous contrast. RADIATION DOSE REDUCTION: This exam was performed according to the departmental dose-optimization program which includes automated exposure control, adjustment of the mA and/or kV according to patient size and/or use of iterative reconstruction technique. CONTRAST:  100mL ISOVUE -300 IOPAMIDOL  (ISOVUE -300) INJECTION 61% COMPARISON:  None Available. FINDINGS: Lower chest: There are patchy atelectatic changes in the visualized lung bases. No overt consolidation. No pleural effusion. The heart is normal in size. No pericardial effusion. Hepatobiliary: The liver is normal in size. Non-cirrhotic configuration. There is a 7.5 by 8.2 cm heterogeneous mixed slightly hyperattenuating and hypoattenuating lesion in the central liver. Lesion exhibit majority of the cystic component in the caudate lobe along the inferomedial aspect of the lesion. This is indeterminate on the basis of this exam. Further evaluation with multiphasic contrast-enhanced MRI abdomen as per hepatic mass protocol is recommended. The lesion causes mild central intrahepatic bile duct dilation. The portal vein tributaries are stressed and mildly compressed however, no discrete thrombosis seen. No calcified gallstones. Normal gallbladder wall thickness. No pericholecystic inflammatory changes. Pancreas: Unremarkable. No pancreatic ductal dilatation or surrounding  inflammatory changes. Spleen: Within normal limits. No focal lesion. Adrenals/Urinary Tract: Adrenal glands are unremarkable. No suspicious renal mass. No hydronephrosis. No renal or ureteric calculi. Urinary bladder is under distended, precluding optimal assessment. However, no large mass or stones identified. No perivesical fat stranding. Stomach/Bowel: There is a small sliding hiatal hernia. No disproportionate dilation of the small or large bowel loops. No evidence of abnormal bowel wall thickening or inflammatory changes. The appendix at the base is unremarkable. However, there is fluid distention of the distal appendix measuring up to 1.7 cm in diameter. No abnormal wall thickening. No surrounding fat stranding. No calcifications. Findings favor appendiceal mucocele. Surgical consultation is recommended. Vascular/Lymphatic: No ascites or pneumoperitoneum. No abdominal or pelvic lymphadenopathy, by size criteria. No aneurysmal dilation of the major abdominal arteries. There are mild peripheral atherosclerotic vascular calcifications of the aorta and its major branches. Reproductive: Not well evaluated on the CT scan exam. However, having said that normal-size anteverted uterus noted. There is a well-circumscribed 3.4 x 3.9  cm hypoattenuating structure in the left adnexa, most likely ovarian in etiology. This may represent a senescent ovarian cyst. Follow-up ultrasound is recommended in 1 year to document stability. No large right adnexal mass seen. Other: There are fat containing umbilical and bilateral inguinal hernias. The soft tissues and abdominal wall are otherwise unremarkable. Musculoskeletal: No suspicious osseous lesions. There are mild multilevel degenerative changes in the visualized spine. IMPRESSION: 1. There is a 7.5 x 8.2 cm heterogeneous lesion in the central liver, which is indeterminate on the basis of this exam. Further evaluation with multiphasic contrast-enhanced MRI abdomen as per hepatic  mass protocol is recommended. 2. There is fluid distention of the distal appendix measuring up to 1.7 cm in diameter. No abnormal wall thickening or surrounding fat stranding. Findings favor appendiceal mucocele. Surgical consultation is recommended. 3. There is a well-circumscribed 3.4 x 3.9 cm hypoattenuating structure in the left adnexa, most likely ovarian in etiology. This may represent a senescent ovarian cyst. Follow-up ultrasound is recommended in 1 year to document stability. 4. Multiple other nonacute observations, as described above. Aortic Atherosclerosis (ICD10-I70.0). Electronically Signed   By: Ree Molt M.D.   On: 07/14/2024 10:05   US  Abdomen Limited RUQ (LIVER/GB) Result Date: 07/14/2024 CLINICAL DATA:  elevated liver labs. EXAM: ULTRASOUND ABDOMEN LIMITED RIGHT UPPER QUADRANT COMPARISON:  None Available. FINDINGS: Gallbladder: Physiologically distended gallbladder containing moderate volume dependent sludge. No abnormal wall thickening. No pericholecystic free fluid. Sonographic Murphy's sign was negative as per the technologist. Common bile duct: Diameter: 2.8 mm.  No intrahepatic bile duct dilation. Liver: Heterogeneous liver echotexture and mildly increased echogenicity. There is an approximately 7.3 x 9.7 cm solid mildly hyperechoic vascular mass in the right lobe, not well evaluated on the current exam. Further evaluation with multiphasic contrast-enhanced MRI abdomen as per hepatic mass protocol is recommended. Portal vein is patent on color Doppler imaging with normal direction of blood flow towards the liver. Other: None. IMPRESSION: 1. There is an approximately 7.3 x 9.7 cm solid mildly hyperechoic vascular mass in the right hepatic lobe, not well evaluated on the current exam. Further evaluation with multiphasic contrast-enhanced MRI abdomen as per hepatic mass protocol is recommended. 2. Moderate volume gallbladder sludge without imaging signs of acute cholecystitis. 3.  Otherwise unremarkable exam. Electronically Signed   By: Ree Molt M.D.   On: 07/14/2024 09:57    Orders Placed This Encounter  Procedures   CBC with Differential (Cancer Center Only)    Standing Status:   Future    Number of Occurrences:   1    Expiration Date:   07/31/2025   Lactate dehydrogenase    Standing Status:   Future    Number of Occurrences:   1    Expiration Date:   07/31/2025   Basic Metabolic Panel - Cancer Center Only    Standing Status:   Future    Number of Occurrences:   1    Expiration Date:   07/31/2025   Hepatic function panel    Standing Status:   Future    Number of Occurrences:   1    Expiration Date:   07/31/2025   Chromogranin A    Standing Status:   Future    Number of Occurrences:   1    Expiration Date:   07/31/2025   Cancer antigen 19-9    Standing Status:   Future    Number of Occurrences:   1    Expiration Date:   07/31/2025  CODE STATUS:  Code Status History     Date Active Date Inactive Code Status Order ID Comments User Context   07/26/2024 0933 07/27/2024 0513 Full Code [id number]  Jennefer Ester PARAS, MD [facility]    Questions for Most Recent Historical Code Status (Order [id number])     Question Answer   By: Consent: discussion documented in EHR            Future Appointments  Date Time Provider Department Center  08/14/2024 11:00 AM [facility] PHLEBOTOMIST [facility] None  08/14/2024 11:30 AM Akua Blethen, Chinita, MD [facility] None  08/14/2024 12:15 PM [facility] INFUSION [facility] None  11/27/2024  1:00 PM [facility] ANNUAL WELLNESS VISIT 1 [facility] Willo Milian  12/12/2024  2:15 PM Whitfield Raisin, NP [facility] None     I spent a total of 70 minutes during this encounter with the patient including review of chart and various tests results, discussions about plan of care and coordination of care plan.  This document was completed utilizing speech recognition software. Grammatical errors, random word insertions, pronoun errors, and incomplete  sentences are an occasional consequence of this system due to software limitations, ambient noise, and hardware issues. Any formal questions or concerns about the content, text or information contained within the body of this dictation should be directly addressed to the provider for clarification.

## 2024-08-01 ENCOUNTER — Encounter: Payer: Self-pay | Admitting: Medical Oncology

## 2024-08-01 LAB — CHROMOGRANIN A: Chromogranin A (ng/mL): 55.6 ng/mL (ref 0.0–101.8)

## 2024-08-01 LAB — CANCER ANTIGEN 19-9: CA 19-9: 49 U/mL — ABNORMAL HIGH (ref 0–35)

## 2024-08-01 NOTE — Progress Notes (Signed)
 Patient called me expressing concerns for PET scan and requesting to speak to [facility] Patient informed that Mallie has left for the day. Patient expressed concerned about contrast with PET scan and not having a full skull scan vs only having a base skull scan. Patient also mentioned having a right side neck lump. Inquired with patient if this was present when she was initially seen in [facility]. Patient states she believes it was. Patient stated she forgot to mention it to Dr. Autumn when seen yesterday. Informed patient that I will provide message to [facility] tomorrow when she returns. Patient expressed concern. Encouraged to call with further questions/concerns.   Colene KYM Raider, RN, BSN, [facility] Oncology Nurse Navigator, [facility] 08/01/2024 4:10 PM

## 2024-08-08 ENCOUNTER — Encounter (HOSPITAL_COMMUNITY)
Admission: RE | Admit: 2024-08-08 | Discharge: 2024-08-08 | Disposition: A | Discharge status: Home / Self Care | Source: Ambulatory Visit | Attending: Oncology | Admitting: Oncology

## 2024-08-08 DIAGNOSIS — C7A8 Other malignant neuroendocrine tumors: Secondary | ICD-10-CM | POA: Diagnosis not present

## 2024-08-08 MED ORDER — COPPER CU 64 DOTATATE 1 MCI/ML IV SOLN
4.0000 | Freq: Once | INTRAVENOUS | Status: AC
  Administered 2024-08-08: 4.178 via INTRAVENOUS

## 2024-08-09 ENCOUNTER — Other Ambulatory Visit: Payer: Self-pay | Admitting: Oncology

## 2024-08-09 ENCOUNTER — Inpatient Hospital Stay: Attending: Physician Assistant

## 2024-08-09 DIAGNOSIS — C7A8 Other malignant neuroendocrine tumors: Secondary | ICD-10-CM

## 2024-08-09 DIAGNOSIS — C7B02 Secondary carcinoid tumors of liver: Secondary | ICD-10-CM | POA: Insufficient documentation

## 2024-08-09 DIAGNOSIS — C7A00 Malignant carcinoid tumor of unspecified site: Secondary | ICD-10-CM | POA: Insufficient documentation

## 2024-08-09 NOTE — Progress Notes (Signed)
 [facility] Clinical Social Work  Clinical Social Work was referred by Statistician for assessment of psychosocial needs.  Clinical Social Worker contacted patient by phone to offer support and assess for needs.  Patient described various supports in place currently, attending a wedding this weekend. Treatment plan is TBD. CSW will make contact midway during treatment.   Lizbeth Sprague, LCSW  Clinical Social Worker [facility]

## 2024-08-14 ENCOUNTER — Inpatient Hospital Stay: Admitting: Oncology

## 2024-08-14 ENCOUNTER — Inpatient Hospital Stay

## 2024-08-14 ENCOUNTER — Inpatient Hospital Stay: Admitting: Nutrition

## 2024-08-14 ENCOUNTER — Encounter: Payer: Self-pay | Admitting: Oncology

## 2024-08-14 VITALS — BP 119/79 | HR 61 | Temp 97.8°F | Resp 18 | Ht 62.0 in | Wt 163.0 lb

## 2024-08-14 VITALS — BP 130/59 | HR 63 | Resp 18

## 2024-08-14 DIAGNOSIS — C7B02 Secondary carcinoid tumors of liver: Secondary | ICD-10-CM | POA: Diagnosis present

## 2024-08-14 DIAGNOSIS — C7A00 Malignant carcinoid tumor of unspecified site: Secondary | ICD-10-CM | POA: Diagnosis not present

## 2024-08-14 DIAGNOSIS — K388 Other specified diseases of appendix: Secondary | ICD-10-CM | POA: Diagnosis not present

## 2024-08-14 DIAGNOSIS — C7A8 Other malignant neuroendocrine tumors: Secondary | ICD-10-CM | POA: Diagnosis not present

## 2024-08-14 DIAGNOSIS — G893 Neoplasm related pain (acute) (chronic): Secondary | ICD-10-CM | POA: Diagnosis not present

## 2024-08-14 LAB — CBC WITH DIFFERENTIAL (CANCER CENTER ONLY)
Abs Immature Granulocytes: 0.02 K/uL (ref 0.00–0.07)
Basophils Absolute: 0.1 K/uL (ref 0.0–0.1)
Basophils Relative: 1 %
Eosinophils Absolute: 0 K/uL (ref 0.0–0.5)
Eosinophils Relative: 0 %
HCT: 39.7 % (ref 36.0–46.0)
Hemoglobin: 13.7 g/dL (ref 12.0–15.0)
Immature Granulocytes: 0 %
Lymphocytes Relative: 33 %
Lymphs Abs: 1.7 K/uL (ref 0.7–4.0)
MCH: 31.9 pg (ref 26.0–34.0)
MCHC: 34.5 g/dL (ref 30.0–36.0)
MCV: 92.5 fL (ref 80.0–100.0)
Monocytes Absolute: 0.5 K/uL (ref 0.1–1.0)
Monocytes Relative: 9 %
Neutro Abs: 2.9 K/uL (ref 1.7–7.7)
Neutrophils Relative %: 57 %
Platelet Count: 280 K/uL (ref 150–400)
RBC: 4.29 MIL/uL (ref 3.87–5.11)
RDW: 14.9 % (ref 11.5–15.5)
WBC Count: 5 K/uL (ref 4.0–10.5)
nRBC: 0 % (ref 0.0–0.2)

## 2024-08-14 LAB — CMP (CANCER CENTER ONLY)
ALT: 43 U/L (ref 0–44)
AST: 44 U/L — ABNORMAL HIGH (ref 15–41)
Albumin: 4.2 g/dL (ref 3.5–5.0)
Alkaline Phosphatase: 202 U/L — ABNORMAL HIGH (ref 38–126)
Anion gap: 11 (ref 5–15)
BUN: 17 mg/dL (ref 8–23)
CO2: 25 mmol/L (ref 22–32)
Calcium: 10 mg/dL (ref 8.9–10.3)
Chloride: 104 mmol/L (ref 98–111)
Creatinine: 0.97 mg/dL (ref 0.44–1.00)
GFR, Estimated: 58 mL/min — ABNORMAL LOW (ref >60)
Glucose, Bld: 108 mg/dL — ABNORMAL HIGH (ref 70–99)
Potassium: 4.4 mmol/L (ref 3.5–5.1)
Sodium: 140 mmol/L (ref 135–145)
Total Bilirubin: 1.1 mg/dL (ref 0.0–1.2)
Total Protein: 7 g/dL (ref 6.5–8.1)

## 2024-08-14 LAB — LACTATE DEHYDROGENASE: LDH: 208 U/L — ABNORMAL HIGH (ref 98–192)

## 2024-08-14 MED ORDER — OCTREOTIDE ACETATE 20 MG IM KIT
20.0000 mg | PACK | Freq: Once | INTRAMUSCULAR | Status: AC
  Administered 2024-08-14: 20 mg via INTRAMUSCULAR

## 2024-08-14 NOTE — Patient Instructions (Signed)
 [facility] - A DEPT OF [place]  Discharge Instructions: Thank you for choosing [facility] to provide your oncology and hematology care.   If you have a lab appointment with the Cancer Center, please go directly to the Cancer Center and check in at the registration area.   Wear comfortable clothing and clothing appropriate for easy access to any Portacath or PICC line.   We strive to give you quality time with your provider. You may need to reschedule your appointment if you arrive late (15 or more minutes).  Arriving late affects you and other patients whose appointments are after yours.  Also, if you miss three or more appointments without notifying the office, you may be dismissed from the clinic at the provider's discretion.      For prescription refill requests, have your pharmacy contact our office and allow 72 hours for refills to be completed.    Today you received the following chemotherapy and/or immunotherapy agents: sandostatin  Octreotide Injection Solution What is this medication? OCTREOTIDE (ok TREE oh tide) treats high levels of growth hormone (acromegaly). It works by reducing the amount of growth hormone your body makes. This reduces symptoms and the risk of health problems caused by too much growth hormone, such as diabetes and heart disease. It may also be used to treat diarrhea caused by neuroendocrine tumors. It works by slowing down the release of serotonin from the tumor cells. This reduces the number of bowel movements you have. This medicine may be used for other purposes; ask your health care provider or pharmacist if you have questions. COMMON BRAND NAME(S): Jerrold, Sandostatin What should I tell my care team before I take this medication? They need to know if you have any of these conditions: Diabetes Gallbladder disease Heart disease Kidney disease Liver disease Pancreatic disease Thyroid  disease An unusual  or allergic reaction to octreotide, other medications, foods, dyes, or preservatives Pregnant or trying to get pregnant Breastfeeding How should I use this medication? This medication is injected under the skin or into a vein. It is usually given by your care team in a hospital or clinic setting. If you get this medication at home, you will be taught how to prepare and give it. Use exactly as directed. Take it as directed on the prescription label at the same time every day. Keep taking it unless your care team tells you to stop. Allow the injection solution to come to room temperature before use. Do not warm it artificially. It is important that you put your used needles and syringes in a special sharps container. Do not put them in a trash can. If you do not have a sharps container, call your pharmacist or care team to get one. Talk to your care team about the use of this medication in children. Special care may be needed. Overdosage: If you think you have taken too much of this medicine contact a poison control center or emergency room at once. NOTE: This medicine is only for you. Do not share this medicine with others. What if I miss a dose? If you miss a dose, take it as soon as you can. If it is almost time for your next dose, take only that dose. Do not take double or extra doses. What may interact with this medication? Bromocriptine Certain medications for blood pressure, heart disease, irregular heartbeat Cyclosporine Diuretics Medications for diabetes, including insulin Quinidine This list may not describe all possible  interactions. Give your health care provider a list of all the medicines, herbs, non-prescription drugs, or dietary supplements you use. Also tell them if you smoke, drink alcohol, or use illegal drugs. Some items may interact with your medicine. What should I watch for while using this medication? Visit your care team for regular checks on your progress. Tell your care  team if your symptoms do not start to get better or if they get worse. To help reduce irritation at the injection site, use a different site for each injection and make sure the solution is at room temperature before use. This medication may cause decreases in blood sugar. Signs of low blood sugar include chills, cool, pale skin or cold sweats, drowsiness, extreme hunger, fast heartbeat, headache, nausea, nervousness or anxiety, shakiness, trembling, unsteadiness, tiredness, or weakness. Contact your care team right away if you experience any of these symptoms. This medication may increase blood sugar. The risk may be higher in patients who already have diabetes. Ask your care team what you can do to lower your risk of diabetes while taking this medication. You should make sure you get enough vitamin B12 while you are taking this medication. Discuss the foods you eat and the vitamins you take with your care team. What side effects may I notice from receiving this medication? Side effects that you should report to your care team as soon as possible: Allergic reactions--skin rash, itching, hives, swelling of the face, lips, tongue, or throat Gallbladder problems--severe stomach pain, nausea, vomiting, fever Heart rhythm changes--fast or irregular heartbeat, dizziness, feeling faint or lightheaded, chest pain, trouble breathing High blood sugar (hyperglycemia)--increased thirst or amount of urine, unusual weakness or fatigue, blurry vision Low blood sugar (hypoglycemia)--tremors or shaking, anxiety, sweating, cold or clammy skin, confusion, dizziness, rapid heartbeat Low thyroid  levels (hypothyroidism)--unusual weakness or fatigue, increased sensitivity to cold, constipation, hair loss, dry skin, weight gain, feelings of depression Low vitamin B12 level--pain, tingling, or numbness in the hands or feet, muscle weakness, dizziness, confusion, trouble concentrating Oily or light-colored stools, diarrhea,  bloating, weight loss Pancreatitis--severe stomach pain that spreads to your back or gets worse after eating or when touched, fever, nausea, vomiting Slow heartbeat--dizziness, feeling faint or lightheaded, confusion, trouble breathing, unusual weakness or fatigue Side effects that usually do not require medical attention (report these to your care team if they continue or are bothersome): Diarrhea Dizziness Headache Nausea Pain, redness, or irritation at injection site Stomach pain This list may not describe all possible side effects. Call your doctor for medical advice about side effects. You may report side effects to FDA at 1-800-FDA-1088. Where should I keep my medication? Keep out of the reach of children and pets. Store in the refrigerator. Protect from light. Allow to come to room temperature naturally. Do not use artificial heat. If protected from light, the injection may be stored between 20 and 30 degrees C (70 and 86 degrees F) for 14 days. After the initial use, throw away any unused portion of a multiple dose vial after 14 days. Get rid of any unused portions of the ampules after use. To get rid of medications that are no longer needed or have expired: Take the medication to a medication take-back program. Ask your pharmacy or law enforcement to find a location. If you cannot return the medication, ask your pharmacist or care team how to get rid of the medication safely. NOTE: This sheet is a summary. It may not cover all possible information. If you have  questions about this medicine, talk to your doctor, pharmacist, or health care provider.  2024 Elsevier/Gold Standard (2023-10-01 00:00:00)      To help prevent nausea and vomiting after your treatment, we encourage you to take your nausea medication as directed.  BELOW ARE SYMPTOMS THAT SHOULD BE REPORTED IMMEDIATELY: *FEVER GREATER THAN 100.4 F (38 C) OR HIGHER *CHILLS OR SWEATING *NAUSEA AND VOMITING THAT IS NOT  CONTROLLED WITH YOUR NAUSEA MEDICATION *UNUSUAL SHORTNESS OF BREATH *UNUSUAL BRUISING OR BLEEDING *URINARY PROBLEMS (pain or burning when urinating, or frequent urination) *BOWEL PROBLEMS (unusual diarrhea, constipation, pain near the anus) TENDERNESS IN MOUTH AND THROAT WITH OR WITHOUT PRESENCE OF ULCERS (sore throat, sores in mouth, or a toothache) UNUSUAL RASH, SWELLING OR PAIN  UNUSUAL VAGINAL DISCHARGE OR ITCHING   Items with * indicate a potential emergency and should be followed up as soon as possible or go to the Emergency Department if any problems should occur.  Please show the CHEMOTHERAPY ALERT CARD or IMMUNOTHERAPY ALERT CARD at check-in to the Emergency Department and triage nurse.  Should you have questions after your visit or need to cancel or reschedule your appointment, please contact [facility]  Dept: [phone]  and follow the prompts.  Office hours are 8:00 a.m. to 4:30 p.m. Monday - Friday. Please note that voicemails left after 4:00 p.m. may not be returned until the following business day.  We are closed weekends and major holidays. You have access to a nurse at all times for urgent questions. Please call the main number to the clinic Dept: [phone] and follow the prompts.   For any non-urgent questions, you may also contact your provider using MyChart. We now offer e-Visits for anyone 50 and older to request care online for non-urgent symptoms. For details visit [email or web address].   Also download the MyChart app! Go to the app store, search MyChart, open the app, select [place], and log in with your MyChart username and password.

## 2024-08-14 NOTE — Assessment & Plan Note (Addendum)
 Please review oncology history for additional details and timeline of events.  Ultrasound-guided biopsy of the liver mass on 07/26/2024 showed well-differentiated neuroendocrine tumor, WHO grade 2. Immunohistochemical stains show the cells were positive for CK AE1/AE3, synaptophysin, chromogranin A and PAX8. The cells are negative for TTF-1, CDX2, and HepPar1. Ki-67 proliferation index is approximately 16%.  Primary remains uncertain at this time.  Although neuroendocrine tumors are reported to originate from the liver, it is rare.  No carcinoid symptoms.  On 07/31/2024, chromogranin A was within normal limits at 55.6 ng/mL.  CA 19-9 was better at 49 units/mL, compared to 64 previously.   On 08/08/2024, staging PET dotatate scan showed large rounded liver mass measuring 7.2 x 8.5 cm, with focal intense DOTATATE uptake, compatible with well-differentiated neuroendocrine tumor.  No dotatate avid nodal or mesenteric disease.  Her case will be discussed in our GI tumor conference.  Referral to surgery versus IR will be planned for intervention.   Given elevated LFTs and tumor burden, we started her on octreotide injections from 08/14/2024.  Plan to continue monthly.  Her LFTs are slowly improving.  This could be from alcohol cessation as well.  Will continue to monitor.

## 2024-08-14 NOTE — Progress Notes (Signed)
 83 year old female diagnosed with Neuroendocrine tumor with unknown primary and evidence of disease to the liver.  She is followed by Dr. Autumn. Treatment includes Octreotide every 28 days  PMH includes GERD, Dyslipidemia, memory retention disorder, and ETOH usage.  Medications include Valium   Labs noted: Glucose 108.  Height: 5' 2 Weight: 163 pounds UBW: 175 pounds BMI: 29.81  Reports 12 pound intentional weight loss. She has made many dietary changes and included more fresh vegetables and fruit. She is eating high fiber foods. Decreased simple carbohydrates. Has added pickled vegetables. Drinks 1 carton of Ensure Max daily providing 150 calories and 30 grams protein. Reports she used to drink 6 oz Gin every day. She stopped when she found out about neuroendocrine tumor. She used drinking and Valium  as a coping mechanism for stress. She used to go to the gym and exercise but hasn't done so in a while. States her energy levels have improved.  Nutrition Diagnosis: Food and Nutrition Related Knowledge Deficit related to neuroendocrine tumor and associated treatments as evidenced by no prior need for nutrition related information.  Intervention: Educated on [employer]. Encourage small amounts of food at meals and snacks. Continue 1 carton of Ensure Max daily. Encouraged weight maintenance. Reviewed tips on constipation. Nutrition fact sheets provided.   Monitoring, Evaluation, Goals: Tolerate adequate calories and protein for weight maintenance.  Next visit to be scheduled as needed.

## 2024-08-14 NOTE — Assessment & Plan Note (Signed)
 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 Not related to the neuroendocrine tumor. No current symptoms attributed to this finding. - Discuss with surgeons during upcoming conference to consider laparoscopic removal if surgical intervention for the liver is pursued

## 2024-08-14 NOTE — Progress Notes (Signed)
 [facility]  ONCOLOGY CLINIC PROGRESS NOTE   Patient Care Team: Job Lukes, [place] as PCP - General (Physician Assistant) Gladis, Inocente Coy, NP as Nurse Practitioner (Neurology) Cyrus Carwin, MD as Consulting Physician (Ophthalmology) Dohmeier, Dedra, MD as Consulting Physician (Neurology) [facility], [facility]eri Pandora Cadet, [facility] as Pharmacist (Pharmacist) Autumn Millman, MD as Consulting Physician (Oncology)  PATIENT NAME: Nicole Holt   MR#: [id number] DOB: 07-Dec-1940  Date of visit: 08/14/2024   ASSESSMENT & PLAN:   Nicole Holt is a 83 y.o. 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   Well differentiated neuroendocrine tumor [facility]) Please review oncology history for additional details and timeline of events.  Ultrasound-guided biopsy of the liver mass on 07/26/2024 showed well-differentiated neuroendocrine tumor, WHO grade 2. Immunohistochemical stains show the cells were positive for CK AE1/AE3, synaptophysin, chromogranin A and PAX8. The cells are negative for TTF-1, CDX2, and HepPar1. Ki-67 proliferation index is approximately 16%.  Primary remains uncertain at this time.  Although neuroendocrine tumors are reported to originate from the liver, it is rare.  No carcinoid symptoms.  On 07/31/2024, chromogranin A was within normal limits at 55.6 ng/mL.  CA 19-9 was better at 49 units/mL, compared to 64 previously.   On 08/08/2024, staging PET dotatate scan showed large rounded liver mass measuring 7.2 x 8.5 cm, with focal intense DOTATATE uptake, compatible with well-differentiated neuroendocrine tumor.  No dotatate avid nodal or  mesenteric disease.  Her case will be discussed in our GI tumor conference.  Referral to surgery versus IR will be planned for intervention.   Given elevated LFTs and tumor burden, we started her on octreotide injections from 08/14/2024.  Plan to continue monthly.  Her LFTs are slowly improving.  This could be from alcohol cessation as well.  Will continue to monitor.  Cancer associated pain Has occasional abdominal pain, in the right upper quadrant/gastric region.  It it is well-managed with tramadol as needed.  Pain has improved compared to prior.  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 Not related to the neuroendocrine tumor. No current symptoms attributed to this finding. - Discuss with surgeons during upcoming conference to consider laparoscopic removal if surgical intervention for the liver is pursued   Abnormal liver function tests, improving Liver function tests have shown improvement. Bilirubin levels decreased from 2.4 to 2.1. AST decreased from 229 to 72, and ALT decreased from 376 to 106. Improvement likely due to lifestyle changes, including cessation of alcohol and dietary modifications.  Chronic constipation Long-standing issue with constipation, not directly linked to the liver tumor. No evidence of intestinal blockage on scans. Previously managed with Miralax, prune juice, and suppositories, but these are no longer in use. Constipation symptoms have improved. - Encourage hydration and dietary adjustments  I reviewed lab results and outside records for this visit and discussed relevant results with the patient. Diagnosis, plan of care and treatment options were also discussed in detail with the patient. Opportunity provided to ask questions and answers provided to her apparent satisfaction. Provided instructions to call our clinic with any problems, questions or concerns prior to return visit. I recommended to  continue follow-up with PCP and sub-specialists. She verbalized  understanding and agreed with the plan.   NCCN guidelines have been consulted in the planning of this patient's care.  I spent a total of 30 minutes during this encounter with the patient including review of chart and various tests results, discussions about plan of care and coordination of care plan.   Chinita Patten, MD  08/14/2024 2:05 PM  [facility] - A DEPT OF 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   Current Treatment: Started octreotide injections from 08/14/2024.  Pending surgical evaluation  INTERVAL HISTORY:    Discussed the use of AI scribe software for clinical note transcription with the patient, who gave verbal consent to proceed.  History of Present Illness Nicole Holt is an 83 year old female with a neuroendocrine tumor who presents for follow-up after a PET scan. Her husband accompanied her on the road trip.   She recently underwent a PET scan which confirmed the presence of a neuroendocrine tumor localized to the liver. She has a history of a biopsy confirming the diagnosis of a neuroendocrine tumor. She has been told she has a sizable tumor in her liver, described to her as approximately grapefruit-sized.  She has a history of digestive issues, including chronic constipation, which she has experienced for a long time. She describes a sensation of blockage in the lower digestive tract, requiring manual manipulation to relieve. She has previously used Clearlax, Miralax, prune juice, and suppositories to manage her symptoms but has since stopped these treatments. She notes improvement in her bowel movements.  She has made dietary  changes, including reducing alcohol and red meat consumption, to improve liver health. She reports a history of elevated liver enzymes, including bilirubin, AST, and ALT, which have shown improvement over time.  She has experienced chronic inflammation since 2021, which she believes has contributed to her digestive issues. She reports a history of severe inflammation affecting her mobility, particularly in her shoulders, which has improved with exercise. She has not returned to the gym recently due to feeling unwell but wants to resume her exercise routine.  She feels better overall, with increased energy and weight loss. She has not experienced any recent pain requiring tramadol, which was previously prescribed for liver-related pain.     I have reviewed the past medical history, past surgical history, social history and family history with the patient and they are unchanged from previous note.  HISTORY OF PRESENT ILLNESS:   ONCOLOGY HISTORY:   Patient had a follow-up appointment with her PCP on 07/04/2024. At that visit she reported bowel changes with constipation and lower abdominal pain so CT AP was ordered. CT AP performed on 07/11/24 showed a 7.5 x 8.2 cm heterogeneous lesion in the central liver.   MR liver was subsequently performed on 07/15/24 showing large mass situated in the central liver closely abutting the IVC and centered in hepatic segment IV, measuring 8.3 x 6.6 cm. No evidence of lymphadenopathy or metastatic disease in the abdomen. Mass effect distorts and compresses the hepatic hilum with consequent intrahepatic biliary ductal dilatation. 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  Hepatic function panel was collected 07/13/24  with results significant for elevated T bili 2.4, alkaline phosphatase 495,  AST/ALT 160/271.    Ultrasound-guided biopsy of the liver mass on 07/26/2024 showed well-differentiated neuroendocrine tumor, WHO grade 2. Immunohistochemical stains show the cells were positive for CK AE1/AE3, synaptophysin, chromogranin A and PAX8. The cells are negative for TTF-1, CDX2, and HepPar1. Ki-67 proliferation index is approximately 16%.  The overall findings are consistent with a well-differentiated neuroendocrine tumor. The primary site could potentially be the pancreas, upper gastrointestinal tract, duodenum, or rectum, among others. Although the appendiceal region and gynecologic system (including carcinoid arising in teratoma) remain possible sites, they are considered less likely. The existence of a primary hepatic neuroendocrine tumor remains controversial; however, if no primary tumor is identified elsewhere, a primary hepatic origin should be considered. Correlation with radiological imaging is recommended for further evaluation.    Primary remains uncertain at this time.  Although neuroendocrine tumors are reported to originate from the liver, it is rare.  On 07/31/2024, chromogranin A was within normal limits at 55.6 ng/mL.  CA 19-9 was better at 49 units/mL, compared to 64 previously.   On 08/08/2024, staging PET dotatate scan showed large rounded liver mass measuring 7.2 x 8.5 cm, with focal intense DOTATATE uptake, compatible with well-differentiated neuroendocrine tumor.  No dotatate avid nodal or mesenteric disease.  Her case will be discussed in our GI tumor conference.  Referral to surgery versus IR will be planned for intervention.   Given elevated LFTs and tumor burden, we started her on octreotide injections from 08/14/2024.  Plan to continue monthly.  REVIEW OF SYSTEMS:   Review of Systems - Oncology  All other pertinent systems were reviewed with the patient and are negative.  ALLERGIES: She is allergic to codeine, estrogens conjugated, and paroxetine  hcl.  MEDICATIONS:  Current Outpatient Medications  Medication Sig Dispense Refill   diazepam  (VALIUM ) 5 MG tablet Take 1 tablet (5 mg total) by mouth every 6 (six) hours as needed. for anxiety 30 tablet 0   fluticasone  (FLONASE ) 50 MCG/ACT nasal spray Use 1 spray(s) in each nostril once daily 16 g 0   latanoprost (XALATAN) 0.005 % ophthalmic solution      traMADol (ULTRAM) 50 MG tablet Take 1 tablet (50 mg total) by mouth every 8 (eight) hours as needed. 60 tablet 0   busPIRone  (BUSPAR ) 5 MG tablet Take 1 tablet (5 mg total) by mouth 2 (two) times daily as needed (anxiety). (Patient not taking: Reported on 08/14/2024) 30 tablet 1   No current facility-administered medications for this visit.     VITALS:   Blood pressure 119/79, pulse 61, temperature 97.8 F (36.6 C), temperature source Oral, resp. rate 18, height 5' 2 (1.575 m), weight 163 lb (73.9 kg), SpO2 96%.  Wt Readings from Last 3 Encounters:  08/14/24 163 lb (73.9 kg)  07/31/24 164 lb 14.4 oz (74.8 kg)  07/26/24 170 lb (77.1 kg)    Body mass index is 29.81 kg/m.    Onc Performance Status - 08/14/24 1126       ECOG Perf Status   ECOG Perf Status Ambulatory and capable of all selfcare but unable to carry out any work activities.  Up and about more than 50% of waking hours      KPS SCALE   KPS % SCORE Cares for self, unable to carry on normal activity or to do active work          PHYSICAL EXAM:   Physical Exam Constitutional:  General: She is not in acute distress.    Appearance: Normal appearance.  HENT:     Head: Normocephalic and atraumatic.  Cardiovascular:     Rate and Rhythm: Normal rate.     Heart sounds: Normal heart sounds.  Pulmonary:     Effort: Pulmonary effort is normal. No respiratory distress.     Breath sounds: Normal breath sounds.  Abdominal:     General: There is no distension.  Neurological:     General: No focal deficit present.     Mental Status: She is alert and oriented to  person, place, and time.  Psychiatric:        Mood and Affect: Mood normal.        Behavior: Behavior normal.      LABORATORY DATA:   I have reviewed the data as listed.  Results for orders placed or performed in visit on 08/14/24  Lactate dehydrogenase  Result Value Ref Range   LDH 208 (H) 98 - 192 U/L  CMP (Cancer Center only)  Result Value Ref Range   Sodium 140 135 - 145 mmol/L   Potassium 4.4 3.5 - 5.1 mmol/L   Chloride 104 98 - 111 mmol/L   CO2 25 22 - 32 mmol/L   Glucose, Bld 108 (H) 70 - 99 mg/dL   BUN 17 8 - 23 mg/dL   Creatinine 9.02 9.55 - 1.00 mg/dL   Calcium  10.0 8.9 - 10.3 mg/dL   Total Protein 7.0 6.5 - 8.1 g/dL   Albumin 4.2 3.5 - 5.0 g/dL   AST 44 (H) 15 - 41 U/L   ALT 43 0 - 44 U/L   Alkaline Phosphatase 202 (H) 38 - 126 U/L   Total Bilirubin 1.1 0.0 - 1.2 mg/dL   GFR, Estimated 58 (L) >60 mL/min   Anion gap 11 5 - 15  CBC with Differential (Cancer Center Only)  Result Value Ref Range   WBC Count 5.0 4.0 - 10.5 K/uL   RBC 4.29 3.87 - 5.11 MIL/uL   Hemoglobin 13.7 12.0 - 15.0 g/dL   HCT 60.2 63.9 - 53.9 %   MCV 92.5 80.0 - 100.0 fL   MCH 31.9 26.0 - 34.0 pg   MCHC 34.5 30.0 - 36.0 g/dL   RDW 85.0 88.4 - 84.4 %   Platelet Count 280 150 - 400 K/uL   nRBC 0.0 0.0 - 0.2 %   Neutrophils Relative % 57 %   Neutro Abs 2.9 1.7 - 7.7 K/uL   Lymphocytes Relative 33 %   Lymphs Abs 1.7 0.7 - 4.0 K/uL   Monocytes Relative 9 %   Monocytes Absolute 0.5 0.1 - 1.0 K/uL   Eosinophils Relative 0 %   Eosinophils Absolute 0.0 0.0 - 0.5 K/uL   Basophils Relative 1 %   Basophils Absolute 0.1 0.0 - 0.1 K/uL   Immature Granulocytes 0 %   Abs Immature Granulocytes 0.02 0.00 - 0.07 K/uL      RADIOGRAPHIC STUDIES:  I have personally reviewed the radiological images as listed and agree with the findings in the report.  NM PET DOTATATE SKULL BASE TO MID THIGH Result Date: 08/10/2024 EXAM: PET AND CT SKULL BASE TO MID THIGH 08/08/2024 04:36:38 PM TECHNIQUE:  RADIOPHARMACEUTICAL: 4.178 mCi copper-64 DOTATATE Uptake time 60 minutes. PET imaging was acquired from the base of the skull to the mid thighs. Non-contrast enhanced computed tomography was obtained for attenuation correction and anatomic localization. COMPARISON: CT 00/00/2025 and MRI 00/13/2025. CLINICAL HISTORY: Neuroendocrine tumor.  4.178 mCi Cu64 Dotatate IV RAC @ 1504 MM; PET Dotatate Scan for well differentiated neuroendocrine tumor; No short acting somatostatin; no long acting somatostatin; EOV. FINDINGS: HEAD AND NECK: Physiologic activity within the pituitary gland, salivary glands, and thyroid  gland. No DOTATATE-avid cervical lymphadenopathy. CHEST: No DOTATATE-avid pulmonary nodules or lymphadenopathy. ABDOMEN AND PELVIS: Physiologic activity within the spleen, adrenal glands, kidneys, and bowel. There is a large rounded mass in the liver, typical to define on a noncontrast CT, measuring approximately 7.2 x 8.5 cm. The majority of the mass is iso intense to the liver parenchyma; however, along the dorsal medial margin of the mass there is a focus of intense DOTATATE activity significantly above liver activity with a maximum standardized uptake value (SUVmax) of 22.3 on image 98. This region measures approximately 4 cm. No DOTATATE-avid nodules within the mesentery. No DOTATATE-avid abdominal pelvic lymph nodes. Fluid distention of the appendix is again noted measuring 16 mm in diameter on image 136. This fluid distended appendix does not have DOTATATE activity. Cystic lesion in the left adnexal region measuring 4.2 cm does not demonstrate DOTATATE activity. BONES AND SOFT TISSUE: No DOTATATE activity within the bones. IMPRESSION: 1. Large rounded liver mass with focal intense DOTATATE uptake compatible with well differentiaed neuroendocrine tumor. 2. No DOTATATE-avid nodal or mesenteric disease. 3. Fluid-distended appendix without DOTATATE uptake. 4. Left adnexal cystic lesion without DOTATATE uptake.  Electronically signed by: Norleen Boxer MD 08/10/2024 03:49 PM EDT RP Workstation: [id number]   US  BIOPSY (LIVER) Result Date: 07/26/2024 INDICATION: 83 year old female with indeterminate central liver mass. EXAM: ULTRASOUND GUIDED LIVER LESION BIOPSY COMPARISON:  None Available. MEDICATIONS: None ANESTHESIA/SEDATION: Fentanyl  75 mcg IV; Versed  1.5 mg IV Total Moderate Sedation time:  11 minutes. The patient's level of consciousness and vital signs were monitored continuously by radiology nursing throughout the procedure under my direct supervision. COMPLICATIONS: None immediate. PROCEDURE: Informed written consent was obtained from the patient after a discussion of the risks, benefits and alternatives to treatment. The patient understands and consents the procedure. A timeout was performed prior to the initiation of the procedure. Ultrasound scanning was performed of the right upper abdominal quadrant demonstrates large central heterogeneously isoechoic hepatic mass. The liver mass was selected for biopsy and the procedure was planned. The right upper abdominal quadrant was prepped and draped in the usual sterile fashion. The overlying soft tissues were anesthetized with 1% lidocaine  with epinephrine . A 17 gauge, 6.8 cm co-axial needle was advanced into a peripheral aspect of the lesion. This was followed by 2 core biopsies with an 18 gauge core device under direct ultrasound guidance. The coaxial needle tract was embolized with a small amount of Gel-Foam slurry and superficial hemostasis was obtained with manual compression. Post procedural scanning was negative for definitive area of hemorrhage or additional complication. A dressing was placed. The patient tolerated the procedure well without immediate post procedural complication. IMPRESSION: Technically successful ultrasound guided core needle biopsy of central liver mass. Ester Sides, MD [facility] Radiology  Electronically Signed   By: Ester Sides M.D.   On: 07/26/2024 12:08   MR LIVER W WO CONTRAST Result Date: 07/18/2024 CLINICAL DATA:  Abdominal mass, intra-abdominal neoplasm suspected large liver mass identified by ultrasound EXAM: MRI ABDOMEN WITHOUT AND WITH CONTRAST TECHNIQUE: Multiplanar multisequence MR imaging of the abdomen was performed both before and after the administration of intravenous contrast. CONTRAST:  8mL GADAVIST  GADOBUTROL  1 MMOL/ML IV SOLN COMPARISON:  Right upper quadrant ultrasound, 07/14/2024, CT abdomen pelvis,  07/11/2024 FINDINGS: Lower chest: No acute abnormality. Hepatobiliary: Large mass situated in the central liver closely abutting the IVC and centered in hepatic segment IV, measuring 8.3 x 6.6 cm (series 6, image 13). Mass demonstrates heterogeneous early arterial hyperenhancement with some cystic or necrotic components inferiorly, with evidence of early washout and some suggestion of capsular enhancement on later contrast phases. Mass effect distorts and compresses the hepatic hilum with consequent intrahepatic biliary ductal dilatation. No gallstones, gallbladder wall thickening, or biliary dilatation. Pancreas: Unremarkable. No pancreatic ductal dilatation or surrounding inflammatory changes. Spleen: Normal in size without significant abnormality. Adrenals/Urinary Tract: Adrenal glands are unremarkable. Kidneys are normal, without renal calculi, solid lesion, or hydronephrosis. Stomach/Bowel: Stomach is within normal limits. No evidence of bowel wall thickening, distention, or inflammatory changes. Vascular/Lymphatic: Aortic atherosclerosis. No enlarged abdominal lymph nodes. Other: No abdominal wall hernia or abnormality. No ascites. Musculoskeletal: No acute or significant osseous findings. IMPRESSION: 1. Large mass situated in the central liver closely abutting the IVC and centered in hepatic segment IV, measuring 8.3 x 6.6 cm. Mass demonstrates heterogeneous early  arterial hyperenhancement with some cystic or necrotic components inferiorly, with evidence of early washout and some suggestion of capsular enhancement on later contrast phases. Mass effect distorts and compresses the hepatic hilum with consequent intrahepatic biliary ductal dilatation. 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 In the absence of cirrhotic liver morphology, a large metastasis or less likely a large focal nodular hyperplasia would remain differential considerations. 2. No evidence of lymphadenopathy or metastatic disease in the abdomen. Electronically Signed   By: Marolyn JONETTA Jaksch M.D.   On: 07/18/2024 07:32     CODE STATUS:  Code Status History     Date Active Date Inactive Code Status Order ID Comments User Context   07/26/2024 0933 07/27/2024 0513 Full Code [id number]  Jennefer Ester PARAS, MD [facility]    Questions for Most Recent Historical Code Status (Order [id number])     Question Answer   By: Consent: discussion documented in EHR            Orders Placed This Encounter  Procedures   CBC with Differential (Cancer Center Only)    Standing Status:   Future    Expected Date:   09/11/2024    Expiration Date:   12/10/2024   CMP (Cancer Center only)    Standing Status:   Future    Expected Date:   09/11/2024    Expiration Date:   12/10/2024   Lactate dehydrogenase    Standing Status:   Future    Expected Date:   09/11/2024    Expiration Date:   12/10/2024     Future Appointments  Date Time Provider Department Center  11/27/2024  1:00 PM [facility] ANNUAL WELLNESS VISIT 1 [facility]  12/12/2024  2:15 PM Whitfield Raisin, NP [facility] None      This document was completed utilizing speech recognition software. Grammatical errors, random word insertions, pronoun errors, and incomplete sentences are an occasional consequence of this system due to software limitations, ambient noise, and hardware issues. Any formal questions or concerns about the  content, text or information contained within the body of this dictation should be directly addressed to the provider for clarification.

## 2024-08-14 NOTE — Assessment & Plan Note (Addendum)
 Has occasional abdominal pain, in the right upper quadrant/gastric region.  It it is well-managed with tramadol as needed.  Pain has improved compared to prior.

## 2024-08-15 ENCOUNTER — Telehealth: Payer: Self-pay | Admitting: Oncology

## 2024-08-15 NOTE — Telephone Encounter (Signed)
 Patient has been scheduled for follow-up visit per 08/14/24 LOS.  Pt aware of scheduled appt details.

## 2024-08-23 ENCOUNTER — Other Ambulatory Visit: Payer: Self-pay | Admitting: *Deleted

## 2024-08-23 NOTE — Progress Notes (Signed)
 The proposed treatment discussed in conference is for discussion purpose only and is not a binding recommendation.  The patients have not been physically examined, or presented with their treatment options.  Therefore, final treatment plans cannot be decided.

## 2024-09-04 ENCOUNTER — Encounter: Payer: Self-pay | Admitting: Radiology

## 2024-09-07 DIAGNOSIS — H524 Presbyopia: Secondary | ICD-10-CM | POA: Diagnosis not present

## 2024-09-07 DIAGNOSIS — H401131 Primary open-angle glaucoma, bilateral, mild stage: Secondary | ICD-10-CM | POA: Diagnosis not present

## 2024-09-07 DIAGNOSIS — H35361 Drusen (degenerative) of macula, right eye: Secondary | ICD-10-CM | POA: Diagnosis not present

## 2024-09-07 DIAGNOSIS — H353131 Nonexudative age-related macular degeneration, bilateral, early dry stage: Secondary | ICD-10-CM | POA: Diagnosis not present

## 2024-09-07 DIAGNOSIS — H35032 Hypertensive retinopathy, left eye: Secondary | ICD-10-CM | POA: Diagnosis not present

## 2024-09-07 LAB — OPHTHALMOLOGY REPORT-SCANNED

## 2024-09-11 ENCOUNTER — Inpatient Hospital Stay: Admitting: Oncology

## 2024-09-11 ENCOUNTER — Inpatient Hospital Stay: Attending: Physician Assistant

## 2024-09-11 ENCOUNTER — Encounter: Payer: Self-pay | Admitting: Oncology

## 2024-09-11 ENCOUNTER — Inpatient Hospital Stay

## 2024-09-11 VITALS — BP 120/59 | HR 57 | Temp 97.9°F | Resp 18 | Ht 62.0 in | Wt 164.6 lb

## 2024-09-11 DIAGNOSIS — C7A098 Malignant carcinoid tumors of other sites: Secondary | ICD-10-CM | POA: Insufficient documentation

## 2024-09-11 DIAGNOSIS — R7989 Other specified abnormal findings of blood chemistry: Secondary | ICD-10-CM | POA: Diagnosis not present

## 2024-09-11 DIAGNOSIS — C7A8 Other malignant neuroendocrine tumors: Secondary | ICD-10-CM

## 2024-09-11 DIAGNOSIS — C7B02 Secondary carcinoid tumors of liver: Secondary | ICD-10-CM | POA: Diagnosis not present

## 2024-09-11 DIAGNOSIS — K5909 Other constipation: Secondary | ICD-10-CM | POA: Diagnosis not present

## 2024-09-11 LAB — CMP (CANCER CENTER ONLY)
ALT: 51 U/L — ABNORMAL HIGH (ref 0–44)
AST: 44 U/L — ABNORMAL HIGH (ref 15–41)
Albumin: 4.5 g/dL (ref 3.5–5.0)
Alkaline Phosphatase: 151 U/L — ABNORMAL HIGH (ref 38–126)
Anion gap: 10 (ref 5–15)
BUN: 18 mg/dL (ref 8–23)
CO2: 27 mmol/L (ref 22–32)
Calcium: 10 mg/dL (ref 8.9–10.3)
Chloride: 101 mmol/L (ref 98–111)
Creatinine: 0.9 mg/dL (ref 0.44–1.00)
GFR, Estimated: >60 mL/min (ref >60)
Glucose, Bld: 122 mg/dL — ABNORMAL HIGH (ref 70–99)
Potassium: 4.3 mmol/L (ref 3.5–5.1)
Sodium: 138 mmol/L (ref 135–145)
Total Bilirubin: 0.7 mg/dL (ref 0.0–1.2)
Total Protein: 7.4 g/dL (ref 6.5–8.1)

## 2024-09-11 LAB — CBC WITH DIFFERENTIAL (CANCER CENTER ONLY)
Abs Immature Granulocytes: 0.03 K/uL (ref 0.00–0.07)
Basophils Absolute: 0.1 K/uL (ref 0.0–0.1)
Basophils Relative: 1 %
Eosinophils Absolute: 0 K/uL (ref 0.0–0.5)
Eosinophils Relative: 0 %
HCT: 44 % (ref 36.0–46.0)
Hemoglobin: 14.6 g/dL (ref 12.0–15.0)
Immature Granulocytes: 0 %
Lymphocytes Relative: 32 %
Lymphs Abs: 2.2 K/uL (ref 0.7–4.0)
MCH: 31.5 pg (ref 26.0–34.0)
MCHC: 33.2 g/dL (ref 30.0–36.0)
MCV: 95 fL (ref 80.0–100.0)
Monocytes Absolute: 0.5 K/uL (ref 0.1–1.0)
Monocytes Relative: 7 %
Neutro Abs: 4 K/uL (ref 1.7–7.7)
Neutrophils Relative %: 60 %
Platelet Count: 242 K/uL (ref 150–400)
RBC: 4.63 MIL/uL (ref 3.87–5.11)
RDW: 14.3 % (ref 11.5–15.5)
WBC Count: 6.8 K/uL (ref 4.0–10.5)
nRBC: 0 % (ref 0.0–0.2)

## 2024-09-11 LAB — LACTATE DEHYDROGENASE: LDH: 216 U/L — ABNORMAL HIGH (ref 98–192)

## 2024-09-11 MED ORDER — OCTREOTIDE ACETATE 20 MG IM KIT
20.0000 mg | PACK | Freq: Once | INTRAMUSCULAR | Status: AC
  Administered 2024-09-11: 20 mg via INTRAMUSCULAR
  Filled 2024-09-11: qty 1

## 2024-09-11 NOTE — Progress Notes (Signed)
 [facility]  ONCOLOGY CLINIC PROGRESS NOTE   Patient Care Team: Job Lukes, [place] as PCP - General (Physician Assistant) Gladis, Inocente Coy, NP as Nurse Practitioner (Neurology) Cyrus Carwin, MD as Consulting Physician (Ophthalmology) Dohmeier, Dedra, MD as Consulting Physician (Neurology) [facility], [facility], [facility] as Pharmacist (Pharmacist) Autumn Millman, MD as Consulting Physician (Oncology)  PATIENT NAME: Nicole Holt   MR#: [id number] DOB: August 24, 1941  Date of visit: 09/11/2024   ASSESSMENT & PLAN:   Nicole Holt is a 83 y.o. 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  Primary well differentiated neuroendocrine tumor of pancreas [facility]) Please review oncology history for additional details and timeline of events.  Ultrasound-guided biopsy of the liver mass on 07/26/2024 showed well-differentiated neuroendocrine tumor, WHO grade 2. Immunohistochemical stains show the cells were positive for CK AE1/AE3, synaptophysin, chromogranin A and PAX8. The cells are negative for TTF-1, CDX2, and HepPar1. Ki-67 proliferation index is approximately 16%.  Primary remains uncertain at this time.  Although neuroendocrine tumors are reported to originate from the liver, it is rare.  No carcinoid symptoms.  On 07/31/2024, chromogranin A was within normal limits at 55.6 ng/mL.  CA 19-9 was better at 49 units/mL, compared to 64 previously.   On 08/08/2024, staging PET  dotatate scan showed large rounded liver mass measuring 7.2 x 8.5 cm, with focal intense DOTATATE uptake, compatible with well-differentiated neuroendocrine tumor.  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  No dotatate avid nodal or mesenteric disease.  Her case was discussed in our GI tumor conference.  Not a candidate for surgical resection given the location of the liver tumor and the size.  Referral sent to IR for liver directed therapy/Y90.    Given elevated LFTs and tumor burden, we started her on octreotide injections from 08/14/2024.  Plan to continue monthly.  Labs reveal no dose-limiting toxicities.  Will proceed with octreotide injection today.  Her LFTs are slowly improving.  This could be from alcohol cessation as well.  Will continue to monitor.   Abnormal liver function tests, improving Liver function tests have shown improvement. Bilirubin levels decreased from 2.4 to 2.1. AST decreased from 229 to 72, and ALT decreased from 376 to 106. Improvement likely due to lifestyle changes, including cessation of alcohol and dietary modifications.  Chronic constipation Long-standing issue with constipation, not directly linked to the liver tumor. No evidence of intestinal blockage on scans. Previously managed with Miralax, prune juice, and suppositories, but these are no longer in use. Constipation symptoms have improved. - Encourage hydration and dietary adjustments  I reviewed lab results and outside records for this visit and discussed relevant results with the patient. Diagnosis, plan of care and treatment options were also discussed in detail with the patient. Opportunity provided to ask questions and answers provided to her apparent satisfaction. Provided instructions to call our clinic with any problems, questions or concerns prior to return visit. I recommended to continue follow-up with PCP and sub-specialists.  She verbalized understanding and agreed with the plan.  NCCN guidelines have been consulted in the planning of this patient's care.  I spent a total of 30 minutes during this encounter with the patient including review of chart and various tests results, discussions about plan of care and coordination of care plan.   Chinita Patten, MD  09/11/2024 2:03 PM  [facility] - A DEPT OF 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   Current Treatment: Started octreotide injections from 08/14/2024.  Pending surgical evaluation  INTERVAL HISTORY:    Discussed the use of AI scribe software for clinical note transcription with the patient, who gave verbal consent to proceed.  History of Present Illness  Nicole Holt is an 83 year old female with a neuroendocrine tumor who presents for follow-up and treatment management.  She is undergoing regular follow-up for a neuroendocrine tumor identified in the liver, possibly originating from the pancreas. The liver tumor is approximately 8.5 cm in diameter, described as almost grapefruit-sized. Recent PET scan indicated subtle activity in the tail of the pancreas.  Her liver function tests have shown improvement over time. Previously elevated bilirubin levels at 2.4 and 2.1 have decreased to 1.1. ALT levels, previously at 376, have normalized to 43.  She experiences discomfort and soreness in the abdominal area, particularly at night, which she attributes to gas. The sensation of trapped gas leads to discomfort, relieved upon passing gas. The discomfort sometimes feels like an impending bowel movement, but often results in just passing gas. She uses hot water  baths to alleviate the  discomfort, describing 'bubbles in the water ' when gas is released.     I have reviewed the past medical history, past surgical history, social history and family history with the patient and they are unchanged from previous note.  HISTORY OF PRESENT ILLNESS:   ONCOLOGY HISTORY:   Patient had a follow-up appointment with her PCP on 07/04/2024. At that visit she reported bowel changes with constipation and lower abdominal pain so CT AP was ordered. CT AP performed on 07/11/24 showed a 7.5 x 8.2 cm heterogeneous lesion in the central liver.   MR liver was subsequently performed on 07/15/24 showing large mass situated in the central liver closely abutting the IVC and centered in hepatic segment IV, measuring 8.3 x 6.6 cm. No evidence of lymphadenopathy or metastatic disease in the abdomen. Mass effect distorts and compresses the hepatic hilum with consequent intrahepatic biliary ductal dilatation. 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  Hepatic function panel was collected 07/13/24 with results significant for elevated T bili 2.4, alkaline phosphatase 495, AST/ALT 160/271.    Ultrasound-guided biopsy of the liver mass on 07/26/2024 showed well-differentiated neuroendocrine tumor, WHO grade 2. Immunohistochemical stains show the cells were positive for CK AE1/AE3, synaptophysin, chromogranin A and PAX8. The cells are negative for TTF-1, CDX2, and HepPar1. Ki-67 proliferation index is approximately 16%.  The overall findings are consistent with a well-differentiated neuroendocrine tumor. The primary site could potentially be the pancreas, upper gastrointestinal tract, duodenum, or rectum, among others. Although the appendiceal region and gynecologic system (including carcinoid arising in teratoma) remain possible sites, they  are considered less likely.  The existence of a primary hepatic neuroendocrine tumor remains controversial; however, if no primary tumor is identified elsewhere, a primary hepatic origin should be considered. Correlation with radiological imaging is recommended for further evaluation.    Primary remains uncertain at this time.  Although neuroendocrine tumors are reported to originate from the liver, it is rare.  On 07/31/2024, chromogranin A was within normal limits at 55.6 ng/mL.  CA 19-9 was better at 49 units/mL, compared to 64 previously.   On 08/08/2024, staging PET dotatate scan showed large rounded liver mass measuring 7.2 x 8.5 cm, with focal intense DOTATATE uptake, compatible with well-differentiated neuroendocrine tumor.  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  No dotatate avid nodal or mesenteric disease.  Her case was discussed in our GI tumor conference.  Not a candidate for surgical resection given the location of the liver tumor and the size.  Referral sent to IR for liver directed therapy/Y90.    Given elevated LFTs and tumor burden, we started her on octreotide injections from 08/14/2024.  Plan to continue monthly.  REVIEW OF SYSTEMS:   Review of Systems - Oncology  All other pertinent systems were reviewed with the patient and are negative.  ALLERGIES: She is allergic to codeine, estrogens conjugated, and paroxetine hcl.  MEDICATIONS:  Current Outpatient Medications  Medication Sig Dispense Refill   busPIRone  (BUSPAR ) 5 MG tablet Take 1 tablet (5 mg total) by mouth 2 (two) times daily as needed (anxiety). 30 tablet 1   diazepam  (VALIUM ) 5 MG tablet Take 1 tablet (5 mg total) by mouth every 6 (six) hours as needed. for anxiety 30 tablet 0   fluticasone  (FLONASE ) 50 MCG/ACT nasal spray Use 1 spray(s) in each nostril once daily 16 g 0   latanoprost (XALATAN) 0.005 % ophthalmic solution      traMADol  (ULTRAM) 50 MG tablet Take 1 tablet (50 mg total) by mouth every 8 (eight) hours as needed. 60 tablet 0   No current facility-administered medications for this visit.     VITALS:   Blood pressure (!) 120/59, pulse (!) 57, temperature 97.9 F (36.6 C), temperature source Temporal, resp. rate 18, height 5' 2 (1.575 m), weight 164 lb 9.6 oz (74.7 kg), SpO2 97%.  Wt Readings from Last 3 Encounters:  09/11/24 164 lb 9.6 oz (74.7 kg)  08/14/24 163 lb (73.9 kg)  07/31/24 164 lb 14.4 oz (74.8 kg)    Body mass index is 30.11 kg/m.    Onc Performance Status - 09/11/24 1149       ECOG Perf Status   ECOG Perf Status Ambulatory and capable of all selfcare but unable to carry out any work activities.  Up and about more than 50% of waking hours      KPS SCALE   KPS % SCORE Cares for self, unable to carry on normal activity or to do active work          PHYSICAL EXAM:   Physical Exam Constitutional:      General: She is not in acute distress.    Appearance: Normal appearance.  HENT:     Head: Normocephalic and atraumatic.  Cardiovascular:     Rate and Rhythm: Normal rate.     Heart sounds: Normal heart sounds.  Pulmonary:     Effort: Pulmonary effort is normal. No respiratory distress.     Breath sounds: Normal breath sounds.  Abdominal:     General: There is no  distension.  Neurological:     General: No focal deficit present.     Mental Status: She is alert and oriented to person, place, and time.  Psychiatric:        Mood and Affect: Mood normal.        Behavior: Behavior normal.      LABORATORY DATA:   I have reviewed the data as listed.  Results for orders placed or performed in visit on 09/11/24  Lactate dehydrogenase  Result Value Ref Range   LDH 216 (H) 98 - 192 U/L  CMP (Cancer Center only)  Result Value Ref Range   Sodium 138 135 - 145 mmol/L   Potassium 4.3 3.5 - 5.1 mmol/L   Chloride 101 98 - 111 mmol/L   CO2 27 22 - 32 mmol/L   Glucose, Bld 122 (H)  70 - 99 mg/dL   BUN 18 8 - 23 mg/dL   Creatinine 9.09 9.55 - 1.00 mg/dL   Calcium  10.0 8.9 - 10.3 mg/dL   Total Protein 7.4 6.5 - 8.1 g/dL   Albumin 4.5 3.5 - 5.0 g/dL   AST 44 (H) 15 - 41 U/L   ALT 51 (H) 0 - 44 U/L   Alkaline Phosphatase 151 (H) 38 - 126 U/L   Total Bilirubin 0.7 0.0 - 1.2 mg/dL   GFR, Estimated >39 >39 mL/min   Anion gap 10 5 - 15  CBC with Differential (Cancer Center Only)  Result Value Ref Range   WBC Count 6.8 4.0 - 10.5 K/uL   RBC 4.63 3.87 - 5.11 MIL/uL   Hemoglobin 14.6 12.0 - 15.0 g/dL   HCT 55.9 63.9 - 53.9 %   MCV 95.0 80.0 - 100.0 fL   MCH 31.5 26.0 - 34.0 pg   MCHC 33.2 30.0 - 36.0 g/dL   RDW 85.6 88.4 - 84.4 %   Platelet Count 242 150 - 400 K/uL   nRBC 0.0 0.0 - 0.2 %   Neutrophils Relative % 60 %   Neutro Abs 4.0 1.7 - 7.7 K/uL   Lymphocytes Relative 32 %   Lymphs Abs 2.2 0.7 - 4.0 K/uL   Monocytes Relative 7 %   Monocytes Absolute 0.5 0.1 - 1.0 K/uL   Eosinophils Relative 0 %   Eosinophils Absolute 0.0 0.0 - 0.5 K/uL   Basophils Relative 1 %   Basophils Absolute 0.1 0.0 - 0.1 K/uL   Immature Granulocytes 0 %   Abs Immature Granulocytes 0.03 0.00 - 0.07 K/uL       RADIOGRAPHIC STUDIES:  I have personally reviewed the radiological images as listed and agree with the findings in the report.  NM PET DOTATATE SKULL BASE TO MID THIGH Addendum: ADDENDUM REPORT: 08/24/2024 08:28   ADDENDUM:  Upon further review, there is DOTATATE activity in the tail of the  pancreas which is subtly evident on the background of intense  radiotracer activity within the splenic hilum. Findings concerning  for neuroendocrine tumor of the pancreatic tail. This certainly  could represent a primary neuroendocrine tumor. Findings discussed  at GI tumor board.   Electronically Signed    By: Jackquline Boxer M.D.    On: 08/24/2024 08:28 Narrative: CLINICAL DATA:  EXAM: PET AND CT SKULL BASE TO MID THIGH 08/08/2024 04:36:38  PM  TECHNIQUE:  RADIOPHARMACEUTICAL: 4.178 mCi copper-64 DOTATATE Uptake time 60 minutes.  PET imaging was acquired from the base of the skull to the mid thighs. Non-contrast enhanced computed tomography was obtained for attenuation correction and  anatomic localization.  COMPARISON: CT 00/00/2025 and MRI 00/13/2025.  CLINICAL HISTORY: Neuroendocrine tumor. 4.178 mCi Cu64 Dotatate IV RAC @ 1504 MM; PET Dotatate Scan for well differentiated neuroendocrine tumor; No short acting somatostatin; no long acting somatostatin; EOV.  FINDINGS:  HEAD AND NECK: Physiologic activity within the pituitary gland, salivary glands, and thyroid  gland. No DOTATATE-avid cervical lymphadenopathy.  CHEST: No DOTATATE-avid pulmonary nodules or lymphadenopathy.  ABDOMEN AND PELVIS: Physiologic activity within the spleen, adrenal glands, kidneys, and bowel. There is a large rounded mass in the liver, typical to define on a noncontrast CT, measuring approximately 7.2 x 8.5 cm. The majority of the mass is iso intense to the liver parenchyma; however, along the dorsal medial margin of the mass there is a focus of intense DOTATATE activity significantly above liver activity with a maximum standardized uptake value (SUVmax) of 22.3 on image 98. This region measures approximately 4 cm. No DOTATATE-avid nodules within the mesentery. No DOTATATE-avid abdominal pelvic lymph nodes. Fluid distention of the appendix is again noted measuring 16 mm in diameter on image 136. This fluid distended appendix does not have DOTATATE activity. Cystic lesion in the left adnexal region measuring 4.2 cm does not demonstrate DOTATATE activity.  BONES AND SOFT TISSUE: No DOTATATE activity within the bones.  IMPRESSION: 1. Large rounded liver mass with focal intense DOTATATE uptake compatible with well differentiaed neuroendocrine tumor. 2. No DOTATATE-avid nodal or mesenteric disease. 3. Fluid-distended  appendix without DOTATATE uptake. 4. Left adnexal cystic lesion without DOTATATE uptake.  Electronically signed by: Norleen Boxer MD 08/10/2024 03:49 PM EDT RP Workstation: [id number]  Electronically Signed   By: Jackquline Boxer M.D.   On: 08/24/2024 08:28   CODE STATUS:  Code Status History     Date Active Date Inactive Code Status Order ID Comments User Context   07/26/2024 0933 07/27/2024 0513 Full Code [id number]  Jennefer Ester PARAS, MD [facility]    Questions for Most Recent Historical Code Status (Order [id number])     Question Answer   By: Consent: discussion documented in EHR            Orders Placed This Encounter  Procedures   CBC with Differential (Cancer Center Only)    Standing Status:   Standing    Number of Occurrences:   6    Expiration Date:   09/11/2025   CMP (Cancer Center only)    Standing Status:   Standing    Number of Occurrences:   6    Expiration Date:   09/11/2025   Lactate dehydrogenase    Standing Status:   Standing    Number of Occurrences:   6    Expiration Date:   09/11/2025   Ambulatory referral to Interventional Radiology    Referral Priority:   Routine    Referral Type:   Consultation    Referral Reason:   Specialty Services Required    Referred to Provider:   Hughes Simmonds, MD    Requested Specialty:   Interventional Radiology    Number of Visits Requested:   1     Future Appointments  Date Time Provider Department Center  11/27/2024  1:00 PM [facility] [place] VISIT 1 [facility]  12/12/2024  2:15 PM Whitfield Raisin, NP [facility] None     This document was completed utilizing speech recognition software. Grammatical errors, random word insertions, pronoun errors, and incomplete sentences are an occasional consequence of this system due to software limitations, ambient noise, and hardware issues. Any formal  questions or concerns about the content, text or information contained within the body of this dictation should be directly  addressed to the provider for clarification.

## 2024-09-11 NOTE — Patient Instructions (Signed)
 Octreotide Injection Solution What is this medication? OCTREOTIDE (ok TREE oh tide) treats high levels of growth hormone (acromegaly). It works by reducing the amount of growth hormone your body makes. This reduces symptoms and the risk of health problems caused by too much growth hormone, such as diabetes and heart disease. It may also be used to treat diarrhea caused by neuroendocrine tumors. It works by slowing down the release of serotonin from the tumor cells. This reduces the number of bowel movements you have. This medicine may be used for other purposes; ask your health care provider or pharmacist if you have questions. COMMON BRAND NAME(S): Berline Lopes, Sandostatin What should I tell my care team before I take this medication? They need to know if you have any of these conditions: Diabetes Gallbladder disease Heart disease Kidney disease Liver disease Pancreatic disease Thyroid disease An unusual or allergic reaction to octreotide, other medications, foods, dyes, or preservatives Pregnant or trying to get pregnant Breastfeeding How should I use this medication? This medication is injected under the skin or into a vein. It is usually given by your care team in a hospital or clinic setting. If you get this medication at home, you will be taught how to prepare and give it. Use exactly as directed. Take it as directed on the prescription label at the same time every day. Keep taking it unless your care team tells you to stop. Allow the injection solution to come to room temperature before use. Do not warm it artificially. It is important that you put your used needles and syringes in a special sharps container. Do not put them in a trash can. If you do not have a sharps container, call your pharmacist or care team to get one. Talk to your care team about the use of this medication in children. Special care may be needed. Overdosage: If you think you have taken too much of this medicine  contact a poison control center or emergency room at once. NOTE: This medicine is only for you. Do not share this medicine with others. What if I miss a dose? If you miss a dose, take it as soon as you can. If it is almost time for your next dose, take only that dose. Do not take double or extra doses. What may interact with this medication? Bromocriptine Certain medications for blood pressure, heart disease, irregular heartbeat Cyclosporine Diuretics Medications for diabetes, including insulin Quinidine This list may not describe all possible interactions. Give your health care provider a list of all the medicines, herbs, non-prescription drugs, or dietary supplements you use. Also tell them if you smoke, drink alcohol, or use illegal drugs. Some items may interact with your medicine. What should I watch for while using this medication? Visit your care team for regular checks on your progress. Tell your care team if your symptoms do not start to get better or if they get worse. To help reduce irritation at the injection site, use a different site for each injection and make sure the solution is at room temperature before use. This medication may cause decreases in blood sugar. Signs of low blood sugar include chills, cool, pale skin or cold sweats, drowsiness, extreme hunger, fast heartbeat, headache, nausea, nervousness or anxiety, shakiness, trembling, unsteadiness, tiredness, or weakness. Contact your care team right away if you experience any of these symptoms. This medication may increase blood sugar. The risk may be higher in patients who already have diabetes. Ask your care team what you can  do to lower your risk of diabetes while taking this medication. You should make sure you get enough vitamin B12 while you are taking this medication. Discuss the foods you eat and the vitamins you take with your care team. What side effects may I notice from receiving this medication? Side effects that  you should report to your care team as soon as possible: Allergic reactions--skin rash, itching, hives, swelling of the face, lips, tongue, or throat Gallbladder problems--severe stomach pain, nausea, vomiting, fever Heart rhythm changes--fast or irregular heartbeat, dizziness, feeling faint or lightheaded, chest pain, trouble breathing High blood sugar (hyperglycemia)--increased thirst or amount of urine, unusual weakness or fatigue, blurry vision Low blood sugar (hypoglycemia)--tremors or shaking, anxiety, sweating, cold or clammy skin, confusion, dizziness, rapid heartbeat Low thyroid levels (hypothyroidism)--unusual weakness or fatigue, increased sensitivity to cold, constipation, hair loss, dry skin, weight gain, feelings of depression Low vitamin B12 level--pain, tingling, or numbness in the hands or feet, muscle weakness, dizziness, confusion, trouble concentrating Oily or light-colored stools, diarrhea, bloating, weight loss Pancreatitis--severe stomach pain that spreads to your back or gets worse after eating or when touched, fever, nausea, vomiting Slow heartbeat--dizziness, feeling faint or lightheaded, confusion, trouble breathing, unusual weakness or fatigue Side effects that usually do not require medical attention (report these to your care team if they continue or are bothersome): Diarrhea Dizziness Headache Nausea Pain, redness, or irritation at injection site Stomach pain This list may not describe all possible side effects. Call your doctor for medical advice about side effects. You may report side effects to FDA at 1-800-FDA-1088. Where should I keep my medication? Keep out of the reach of children and pets. Store in the refrigerator. Protect from light. Allow to come to room temperature naturally. Do not use artificial heat. If protected from light, the injection may be stored between 20 and 30 degrees C (70 and 86 degrees F) for 14 days. After the initial use, throw away  any unused portion of a multiple dose vial after 14 days. Get rid of any unused portions of the ampules after use. To get rid of medications that are no longer needed or have expired: Take the medication to a medication take-back program. Ask your pharmacy or law enforcement to find a location. If you cannot return the medication, ask your pharmacist or care team how to get rid of the medication safely. NOTE: This sheet is a summary. It may not cover all possible information. If you have questions about this medicine, talk to your doctor, pharmacist, or health care provider.  2024 Elsevier/Gold Standard (2023-10-01 00:00:00)

## 2024-09-11 NOTE — Progress Notes (Signed)
Patient seen by  Dr. Seward Carol are within treatment parameters.  Labs reviewed by  Dr. Arlana Pouch   and are within treatment parameters.  Per physician team, patient is ready for treatment and there are NO modifications to the treatment plan.

## 2024-09-11 NOTE — Assessment & Plan Note (Addendum)
 Please review oncology history for additional details and timeline of events.  Ultrasound-guided biopsy of the liver mass on 07/26/2024 showed well-differentiated neuroendocrine tumor, WHO grade 2. Immunohistochemical stains show the cells were positive for CK AE1/AE3, synaptophysin, chromogranin A and PAX8. The cells are negative for TTF-1, CDX2, and HepPar1. Ki-67 proliferation index is approximately 16%.  Primary remains uncertain at this time.  Although neuroendocrine tumors are reported to originate from the liver, it is rare.  No carcinoid symptoms.  On 07/31/2024, chromogranin A was within normal limits at 55.6 ng/mL.  CA 19-9 was better at 49 units/mL, compared to 64 previously.   On 08/08/2024, staging PET dotatate scan showed large rounded liver mass measuring 7.2 x 8.5 cm, with focal intense DOTATATE uptake, compatible with well-differentiated neuroendocrine tumor.  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  No dotatate avid nodal or mesenteric disease.  Her case was discussed in our GI tumor conference.  Not a candidate for surgical resection given the location of the liver tumor and the size.  Referral sent to IR for liver directed therapy/Y90.    Given elevated LFTs and tumor burden, we started her on octreotide injections from 08/14/2024.  Plan to continue monthly.  Labs reveal no dose-limiting toxicities.  Will proceed with octreotide injection today.  Her LFTs are slowly improving.  This could be from alcohol cessation as well.  Will continue to monitor.

## 2024-09-12 ENCOUNTER — Telehealth: Payer: Self-pay | Admitting: Oncology

## 2024-09-12 NOTE — Telephone Encounter (Signed)
 Patient has been scheduled for follow-up visit per 09/11/24 LOS.  LVM notifying pt of appt details, provided my direct number to pt if appt changes need to be made.

## 2024-10-09 ENCOUNTER — Inpatient Hospital Stay: Admitting: Oncology

## 2024-10-09 ENCOUNTER — Inpatient Hospital Stay: Attending: Physician Assistant

## 2024-10-09 ENCOUNTER — Inpatient Hospital Stay

## 2024-10-09 ENCOUNTER — Encounter: Payer: Self-pay | Admitting: Oncology

## 2024-10-09 VITALS — BP 129/89 | HR 62 | Temp 98.1°F | Resp 16 | Wt 163.3 lb

## 2024-10-09 DIAGNOSIS — C7A098 Malignant carcinoid tumors of other sites: Secondary | ICD-10-CM | POA: Insufficient documentation

## 2024-10-09 DIAGNOSIS — C7A8 Other malignant neuroendocrine tumors: Secondary | ICD-10-CM | POA: Diagnosis not present

## 2024-10-09 DIAGNOSIS — K5909 Other constipation: Secondary | ICD-10-CM | POA: Insufficient documentation

## 2024-10-09 DIAGNOSIS — R7989 Other specified abnormal findings of blood chemistry: Secondary | ICD-10-CM | POA: Diagnosis not present

## 2024-10-09 DIAGNOSIS — C7B02 Secondary carcinoid tumors of liver: Secondary | ICD-10-CM | POA: Insufficient documentation

## 2024-10-09 LAB — CMP (CANCER CENTER ONLY)
ALT: 53 U/L — ABNORMAL HIGH (ref 0–44)
AST: 50 U/L — ABNORMAL HIGH (ref 15–41)
Albumin: 4.5 g/dL (ref 3.5–5.0)
Alkaline Phosphatase: 241 U/L — ABNORMAL HIGH (ref 38–126)
Anion gap: 13 (ref 5–15)
BUN: 20 mg/dL (ref 8–23)
CO2: 27 mmol/L (ref 22–32)
Calcium: 10.5 mg/dL — ABNORMAL HIGH (ref 8.9–10.3)
Chloride: 101 mmol/L (ref 98–111)
Creatinine: 0.97 mg/dL (ref 0.44–1.00)
GFR, Estimated: 58 mL/min — ABNORMAL LOW (ref >60)
Glucose, Bld: 109 mg/dL — ABNORMAL HIGH (ref 70–99)
Potassium: 4.1 mmol/L (ref 3.5–5.1)
Sodium: 141 mmol/L (ref 135–145)
Total Bilirubin: 0.5 mg/dL (ref 0.0–1.2)
Total Protein: 7.8 g/dL (ref 6.5–8.1)

## 2024-10-09 LAB — CBC WITH DIFFERENTIAL (CANCER CENTER ONLY)
Abs Immature Granulocytes: 0.04 K/uL (ref 0.00–0.07)
Basophils Absolute: 0.1 K/uL (ref 0.0–0.1)
Basophils Relative: 1 %
Eosinophils Absolute: 0 K/uL (ref 0.0–0.5)
Eosinophils Relative: 0 %
HCT: 46.2 % — ABNORMAL HIGH (ref 36.0–46.0)
Hemoglobin: 15.4 g/dL — ABNORMAL HIGH (ref 12.0–15.0)
Immature Granulocytes: 1 %
Lymphocytes Relative: 44 %
Lymphs Abs: 3.5 K/uL (ref 0.7–4.0)
MCH: 31.4 pg (ref 26.0–34.0)
MCHC: 33.3 g/dL (ref 30.0–36.0)
MCV: 94.3 fL (ref 80.0–100.0)
Monocytes Absolute: 0.5 K/uL (ref 0.1–1.0)
Monocytes Relative: 6 %
Neutro Abs: 3.9 K/uL (ref 1.7–7.7)
Neutrophils Relative %: 48 %
Platelet Count: 275 K/uL (ref 150–400)
RBC: 4.9 MIL/uL (ref 3.87–5.11)
RDW: 13.3 % (ref 11.5–15.5)
WBC Count: 8.1 K/uL (ref 4.0–10.5)
nRBC: 0 % (ref 0.0–0.2)

## 2024-10-09 LAB — LACTATE DEHYDROGENASE: LDH: 233 U/L (ref 105–235)

## 2024-10-09 MED ORDER — OCTREOTIDE ACETATE 20 MG IM KIT
20.0000 mg | PACK | Freq: Once | INTRAMUSCULAR | Status: AC
  Administered 2024-10-09: 20 mg via INTRAMUSCULAR
  Filled 2024-10-09: qty 1

## 2024-10-09 NOTE — Progress Notes (Signed)
 [facility]  ONCOLOGY CLINIC PROGRESS NOTE   Patient Care Team: Job Lukes, [place] as PCP - General (Physician Assistant) Gladis, Inocente Coy, NP as Nurse Practitioner (Neurology) Cyrus Carwin, MD as Consulting Physician (Ophthalmology) Dohmeier, Dedra, MD as Consulting Physician (Neurology) [facility], [facility] Keri Pandora Cadet, [facility] as Pharmacist (Pharmacist) Autumn Millman, MD as Consulting Physician (Oncology)  PATIENT NAME: Nicole Holt   MR#: [id number] DOB: Sep 05, 1941  Date of visit: 10/09/2024   ASSESSMENT & PLAN:   Nicole Holt is a 83 y.o. 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  Primary well differentiated neuroendocrine tumor of pancreas [facility]) Please review oncology history for additional details and timeline of events.  Ultrasound-guided biopsy of the liver mass on 07/26/2024 showed well-differentiated neuroendocrine tumor, WHO grade 2. Immunohistochemical stains show the cells were positive for CK AE1/AE3, synaptophysin, chromogranin A and PAX8. The cells are negative for TTF-1, CDX2, and HepPar1. Ki-67 proliferation index is approximately 16%.  Primary remains uncertain at this time.  Although neuroendocrine tumors are reported to originate from the liver, it is rare.  No carcinoid symptoms.  On 07/31/2024, chromogranin A was within normal limits at 55.6 ng/mL.  CA 19-9 was better at 49 units/mL, compared to 64 previously.   On  08/08/2024, staging PET dotatate scan showed large rounded liver mass measuring 7.2 x 8.5 cm, with focal intense DOTATATE uptake, compatible with well-differentiated neuroendocrine tumor.  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  No dotatate avid nodal or mesenteric disease.  Her case was discussed in our GI tumor conference.  Not a candidate for surgical resection given the location of the liver tumor and the size.  Referral previous sent to IR for liver directed therapy/Y90.  Appointment pending.   Given elevated LFTs and tumor burden, we started her on octreotide  injections from 08/14/2024.  Plan to continue monthly.  Labs reveal no dose-limiting toxicities.  Will proceed with octreotide  injection today.  Her LFTs are slowly improving.  This could be from alcohol cessation as well.  Will continue to monitor.   Abnormal liver function tests, improving Liver function tests show fluctuating alkaline phosphatase levels, currently at 240, with previous peaks at 520. AST and ALT levels have improved, with AST at 50 and ALT at 53. No recent alcohol consumption reported. Liver function tests will be monitored closely. - Continue to monitor liver function tests, particularly alkaline phosphatase, AST, and ALT levels.  Chronic constipation Long-standing issue with constipation, not directly linked to the liver tumor. No evidence of intestinal blockage on scans. Previously managed with Miralax, prune juice, and suppositories, but these are no longer in use. Constipation symptoms have improved. - Encourage hydration and dietary adjustments  I reviewed lab results and outside records for this visit and discussed relevant results with the patient. Diagnosis, plan of care and treatment options were also discussed in detail with the patient. Opportunity provided to ask questions and answers provided to her apparent satisfaction. Provided  instructions to call our clinic with any  problems, questions or concerns prior to return visit. I recommended to continue follow-up with PCP and sub-specialists. She verbalized understanding and agreed with the plan.   NCCN guidelines have been consulted in the planning of this patient's care.  I spent a total of 32 minutes during this encounter with the patient including review of chart and various tests results, discussions about plan of care and coordination of care plan.  Chinita Patten, MD  10/09/2024 4:54 PM   [facility] A DEPT OF 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   Current Treatment: Started octreotide  injections from 08/14/2024.  Not a candidate for surgery.   INTERVAL HISTORY:    Discussed the use of AI scribe software for clinical note transcription with the patient, who gave verbal consent to proceed.  History of Present Illness  Nicole Holt is an 83 year old female with a neuroendocrine tumor who presents for follow-up after her second injection treatment.  She is concerned about muscle loss, particularly in the buttocks, following the injections and is considering whether she should be administered in her arms instead. The injections are targeting neuroendocrine tumor cells in both the pancreas and liver.  At the time of diagnosis, the tumor's origin was unclear, but a PET scan later identified the pancreas and liver as sites of involvement. She has not yet received communication from the interventional radiologist regarding Y90.  She has financial concerns related to her treatment, noting a bill of [id number] for the first injection, with insurance covering part of the cost. Her copay for the  year is expected to be around $9,000.  She has been experiencing trouble sleeping since learning about the mass and is considering returning to melatonin for sleep aid. She has been avoiding alcohol and is cautious about medications to protect her liver.  No recent alcohol consumption.     I have reviewed the past medical history, past surgical history, social history and family history with the patient and they are unchanged from previous note.  HISTORY OF PRESENT ILLNESS:   ONCOLOGY HISTORY:   Patient had a follow-up appointment with her PCP on 07/04/2024. At that visit she reported bowel changes with constipation and lower abdominal pain so CT AP was ordered. CT AP performed on 07/11/24 showed a 7.5 x 8.2 cm heterogeneous lesion in the central liver.   MR liver was subsequently performed on 07/15/24 showing large mass situated in the central liver closely abutting the IVC and centered in hepatic segment IV, measuring 8.3 x 6.6 cm. No evidence of lymphadenopathy or metastatic disease in the abdomen. Mass effect distorts and compresses the hepatic hilum with consequent intrahepatic biliary ductal dilatation. 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  Hepatic function panel was collected 07/13/24 with results significant for elevated T bili 2.4, alkaline phosphatase 495, AST/ALT 160/271.    Ultrasound-guided biopsy of the liver mass on 07/26/2024 showed well-differentiated neuroendocrine tumor, WHO grade 2. Immunohistochemical stains show the cells were positive for CK AE1/AE3, synaptophysin, chromogranin A and PAX8. The cells are negative for TTF-1, CDX2, and HepPar1. Ki-67 proliferation index is approximately 16%.  The overall findings  are consistent with a well-differentiated neuroendocrine tumor. The primary site  could potentially be the pancreas, upper gastrointestinal tract, duodenum, or rectum, among others. Although the appendiceal region and gynecologic system (including carcinoid arising in teratoma) remain possible sites, they are considered less likely. The existence of a primary hepatic neuroendocrine tumor remains controversial; however, if no primary tumor is identified elsewhere, a primary hepatic origin should be considered. Correlation with radiological imaging is recommended for further evaluation.    Primary remains uncertain at this time.  Although neuroendocrine tumors are reported to originate from the liver, it is rare.  On 07/31/2024, chromogranin A was within normal limits at 55.6 ng/mL.  CA 19-9 was better at 49 units/mL, compared to 64 previously.   On 08/08/2024, staging PET dotatate scan showed large rounded liver mass measuring 7.2 x 8.5 cm, with focal intense DOTATATE uptake, compatible with well-differentiated neuroendocrine tumor.  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  No dotatate avid nodal or mesenteric disease.  Her case was discussed in our GI tumor conference.  Not a candidate for surgical resection given the location of the liver tumor and the size.  Referral sent to IR for liver directed therapy/Y90.    Given elevated LFTs and tumor burden, we started her on octreotide  injections from 08/14/2024.  Plan to continue monthly.  REVIEW OF SYSTEMS:   Review of Systems - Oncology  All other pertinent systems were reviewed with the patient and are negative.  ALLERGIES: She is allergic to codeine, estrogens conjugated, and paroxetine hcl.  MEDICATIONS:  Current Outpatient Medications  Medication Sig Dispense Refill   latanoprost (XALATAN) 0.005 % ophthalmic solution      busPIRone  (BUSPAR ) 5 MG tablet Take 1 tablet (5 mg total) by mouth 2 (two) times daily as needed (anxiety). (Patient not taking:  Reported on 10/09/2024) 30 tablet 1   diazepam  (VALIUM ) 5 MG tablet Take 1 tablet (5 mg total) by mouth every 6 (six) hours as needed. for anxiety (Patient not taking: Reported on 10/09/2024) 30 tablet 0   fluticasone  (FLONASE ) 50 MCG/ACT nasal spray Use 1 spray(s) in each nostril once daily (Patient not taking: Reported on 10/09/2024) 16 g 0   traMADol  (ULTRAM ) 50 MG tablet Take 1 tablet (50 mg total) by mouth every 8 (eight) hours as needed. (Patient not taking: Reported on 10/09/2024) 60 tablet 0   No current facility-administered medications for this visit.     VITALS:   Blood pressure 129/89, pulse 62, temperature 98.1 F (36.7 C), temperature source Temporal, resp. rate 16, weight 163 lb 4.8 oz (74.1 kg), SpO2 96%.  Wt Readings from Last 3 Encounters:  10/09/24 163 lb 4.8 oz (74.1 kg)  09/11/24 164 lb 9.6 oz (74.7 kg)  08/14/24 163 lb (73.9 kg)    Body mass index is 29.87 kg/m.    Onc Performance Status - 10/09/24 0947       ECOG Perf Status   ECOG Perf Status Ambulatory and capable of all selfcare but unable to carry out any work activities.  Up and about more than 50% of waking hours      KPS SCALE   KPS % SCORE Cares for self, unable to carry on normal activity or to do active work          PHYSICAL EXAM:   Physical Exam Constitutional:      General: She is not in acute distress.    Appearance: Normal appearance.  HENT:  Head: Normocephalic and atraumatic.  Cardiovascular:     Rate and Rhythm: Normal rate.     Heart sounds: Normal heart sounds.  Pulmonary:     Effort: Pulmonary effort is normal. No respiratory distress.     Breath sounds: Normal breath sounds.  Abdominal:     General: There is no distension.  Neurological:     General: No focal deficit present.     Mental Status: She is alert and oriented to person, place, and time.  Psychiatric:        Mood and Affect: Mood normal.        Behavior: Behavior normal.      LABORATORY DATA:   I have  reviewed the data as listed.  Results for orders placed or performed in visit on 10/09/24  Lactate dehydrogenase  Result Value Ref Range   LDH 233 105 - 235 U/L  CMP (Cancer Center only)  Result Value Ref Range   Sodium 141 135 - 145 mmol/L   Potassium 4.1 3.5 - 5.1 mmol/L   Chloride 101 98 - 111 mmol/L   CO2 27 22 - 32 mmol/L   Glucose, Bld 109 (H) 70 - 99 mg/dL   BUN 20 8 - 23 mg/dL   Creatinine 9.02 9.55 - 1.00 mg/dL   Calcium  10.5 (H) 8.9 - 10.3 mg/dL   Total Protein 7.8 6.5 - 8.1 g/dL   Albumin 4.5 3.5 - 5.0 g/dL   AST 50 (H) 15 - 41 U/L   ALT 53 (H) 0 - 44 U/L   Alkaline Phosphatase 241 (H) 38 - 126 U/L   Total Bilirubin 0.5 0.0 - 1.2 mg/dL   GFR, Estimated 58 (L) >60 mL/min   Anion gap 13 5 - 15  CBC with Differential (Cancer Center Only)  Result Value Ref Range   WBC Count 8.1 4.0 - 10.5 K/uL   RBC 4.90 3.87 - 5.11 MIL/uL   Hemoglobin 15.4 (H) 12.0 - 15.0 g/dL   HCT 53.7 (H) 63.9 - 53.9 %   MCV 94.3 80.0 - 100.0 fL   MCH 31.4 26.0 - 34.0 pg   MCHC 33.3 30.0 - 36.0 g/dL   RDW 86.6 88.4 - 84.4 %   Platelet Count 275 150 - 400 K/uL   nRBC 0.0 0.0 - 0.2 %   Neutrophils Relative % 48 %   Neutro Abs 3.9 1.7 - 7.7 K/uL   Lymphocytes Relative 44 %   Lymphs Abs 3.5 0.7 - 4.0 K/uL   Monocytes Relative 6 %   Monocytes Absolute 0.5 0.1 - 1.0 K/uL   Eosinophils Relative 0 %   Eosinophils Absolute 0.0 0.0 - 0.5 K/uL   Basophils Relative 1 %   Basophils Absolute 0.1 0.0 - 0.1 K/uL   Immature Granulocytes 1 %   Abs Immature Granulocytes 0.04 0.00 - 0.07 K/uL     RADIOGRAPHIC STUDIES:  I have personally reviewed the radiological images as listed and agree with the findings in the report.  NM PET DOTATATE SKULL BASE TO MID THIGH Addendum: ADDENDUM REPORT: 08/24/2024 08:28   ADDENDUM:  Upon further review, there is DOTATATE activity in the tail of the  pancreas which is subtly evident on the background of intense  radiotracer activity within the splenic hilum.  Findings concerning  for neuroendocrine tumor of the pancreatic tail. This certainly  could represent a primary neuroendocrine tumor. Findings discussed  at GI tumor board.   Electronically Signed    By: Jackquline Malva HERO.D.  On: 08/24/2024 08:28 Narrative: CLINICAL DATA:  EXAM: PET AND CT SKULL BASE TO MID THIGH 08/08/2024 04:36:38 PM  TECHNIQUE:  RADIOPHARMACEUTICAL: 4.178 mCi copper -64 DOTATATE Uptake time 60 minutes.  PET imaging was acquired from the base of the skull to the mid thighs. Non-contrast enhanced computed tomography was obtained for attenuation correction and anatomic localization.  COMPARISON: CT 00/00/2025 and MRI 00/13/2025.  CLINICAL HISTORY: Neuroendocrine tumor. 4.178 mCi Cu64 Dotatate IV RAC @ 1504 MM; PET Dotatate Scan for well differentiated neuroendocrine tumor; No short acting somatostatin; no long acting somatostatin; EOV.  FINDINGS:  HEAD AND NECK: Physiologic activity within the pituitary gland, salivary glands, and thyroid  gland. No DOTATATE-avid cervical lymphadenopathy.  CHEST: No DOTATATE-avid pulmonary nodules or lymphadenopathy.  ABDOMEN AND PELVIS: Physiologic activity within the spleen, adrenal glands, kidneys, and bowel. There is a large rounded mass in the liver, typical to define on a noncontrast CT, measuring approximately 7.2 x 8.5 cm. The majority of the mass is iso intense to the liver parenchyma; however, along the dorsal medial margin of the mass there is a focus of intense DOTATATE activity significantly above liver activity with a maximum standardized uptake value (SUVmax) of 22.3 on image 98. This region measures approximately 4 cm. No DOTATATE-avid nodules within the mesentery. No DOTATATE-avid abdominal pelvic lymph nodes. Fluid distention of the appendix is again noted measuring 16 mm in diameter on image 136. This fluid distended appendix does not have DOTATATE activity. Cystic lesion in the left  adnexal region measuring 4.2 cm does not demonstrate DOTATATE activity.  BONES AND SOFT TISSUE: No DOTATATE activity within the bones.  IMPRESSION: 1. Large rounded liver mass with focal intense DOTATATE uptake compatible with well differentiaed neuroendocrine tumor. 2. No DOTATATE-avid nodal or mesenteric disease. 3. Fluid-distended appendix without DOTATATE uptake. 4. Left adnexal cystic lesion without DOTATATE uptake.  Electronically signed by: Norleen Boxer MD 08/10/2024 03:49 PM EDT RP Workstation: [id number]  Electronically Signed   By: Jackquline Boxer M.D.   On: 08/24/2024 08:28   CODE STATUS:  Code Status History     Date Active Date Inactive Code Status Order ID Comments User Context   07/26/2024 0933 07/27/2024 0513 Full Code [id number]  Jennefer Ester PARAS, MD [facility]    Questions for Most Recent Historical Code Status (Order [id number])     Question Answer   By: Consent: discussion documented in EHR            No orders of the defined types were placed in this encounter.    Future Appointments  Date Time Provider Department Center  11/27/2024  1:00 PM [facility] VISIT 1 [facility]  01/02/2025  1:15 PM Whitfield Raisin, NP [facility] None     This document was completed utilizing speech recognition software. Grammatical errors, random word insertions, pronoun errors, and incomplete sentences are an occasional consequence of this system due to software limitations, ambient noise, and hardware issues. Any formal questions or concerns about the content, text or information contained within the body of this dictation should be directly addressed to the provider for clarification.

## 2024-10-09 NOTE — Assessment & Plan Note (Addendum)
 Please review oncology history for additional details and timeline of events.  Ultrasound-guided biopsy of the liver mass on 07/26/2024 showed well-differentiated neuroendocrine tumor, WHO grade 2. Immunohistochemical stains show the cells were positive for CK AE1/AE3, synaptophysin, chromogranin A and PAX8. The cells are negative for TTF-1, CDX2, and HepPar1. Ki-67 proliferation index is approximately 16%.  Primary remains uncertain at this time.  Although neuroendocrine tumors are reported to originate from the liver, it is rare.  No carcinoid symptoms.  On 07/31/2024, chromogranin A was within normal limits at 55.6 ng/mL.  CA 19-9 was better at 49 units/mL, compared to 64 previously.   On 08/08/2024, staging PET dotatate scan showed large rounded liver mass measuring 7.2 x 8.5 cm, with focal intense DOTATATE uptake, compatible with well-differentiated neuroendocrine tumor.  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  No dotatate avid nodal or mesenteric disease.  Her case was discussed in our GI tumor conference.  Not a candidate for surgical resection given the location of the liver tumor and the size.  Referral previous sent to IR for liver directed therapy/Y90.  Appointment pending.   Given elevated LFTs and tumor burden, we started her on octreotide  injections from 08/14/2024.  Plan to continue monthly.  Labs reveal no dose-limiting toxicities.  Will proceed with octreotide  injection today.  Her LFTs are slowly improving.  This could be from alcohol cessation as well.  Will continue to monitor.

## 2024-10-09 NOTE — Progress Notes (Signed)
 Patient seen by Dr. Archie Patten Pasam today  Vitals are within treatment parameters:Yes   Labs are within treatment parameters: Yes   Treatment plan has been signed: Yes   Per physician team, Patient is ready for treatment and there are NO modifications to the treatment plan.

## 2024-10-09 NOTE — Patient Instructions (Signed)
 Octreotide Injection Solution What is this medication? OCTREOTIDE (ok TREE oh tide) treats high levels of growth hormone (acromegaly). It works by reducing the amount of growth hormone your body makes. This reduces symptoms and the risk of health problems caused by too much growth hormone, such as diabetes and heart disease. It may also be used to treat diarrhea caused by neuroendocrine tumors. It works by slowing down the release of serotonin from the tumor cells. This reduces the number of bowel movements you have. This medicine may be used for other purposes; ask your health care provider or pharmacist if you have questions. COMMON BRAND NAME(S): Berline Lopes, Sandostatin What should I tell my care team before I take this medication? They need to know if you have any of these conditions: Diabetes Gallbladder disease Heart disease Kidney disease Liver disease Pancreatic disease Thyroid disease An unusual or allergic reaction to octreotide, other medications, foods, dyes, or preservatives Pregnant or trying to get pregnant Breastfeeding How should I use this medication? This medication is injected under the skin or into a vein. It is usually given by your care team in a hospital or clinic setting. If you get this medication at home, you will be taught how to prepare and give it. Use exactly as directed. Take it as directed on the prescription label at the same time every day. Keep taking it unless your care team tells you to stop. Allow the injection solution to come to room temperature before use. Do not warm it artificially. It is important that you put your used needles and syringes in a special sharps container. Do not put them in a trash can. If you do not have a sharps container, call your pharmacist or care team to get one. Talk to your care team about the use of this medication in children. Special care may be needed. Overdosage: If you think you have taken too much of this medicine  contact a poison control center or emergency room at once. NOTE: This medicine is only for you. Do not share this medicine with others. What if I miss a dose? If you miss a dose, take it as soon as you can. If it is almost time for your next dose, take only that dose. Do not take double or extra doses. What may interact with this medication? Bromocriptine Certain medications for blood pressure, heart disease, irregular heartbeat Cyclosporine Diuretics Medications for diabetes, including insulin Quinidine This list may not describe all possible interactions. Give your health care provider a list of all the medicines, herbs, non-prescription drugs, or dietary supplements you use. Also tell them if you smoke, drink alcohol, or use illegal drugs. Some items may interact with your medicine. What should I watch for while using this medication? Visit your care team for regular checks on your progress. Tell your care team if your symptoms do not start to get better or if they get worse. To help reduce irritation at the injection site, use a different site for each injection and make sure the solution is at room temperature before use. This medication may cause decreases in blood sugar. Signs of low blood sugar include chills, cool, pale skin or cold sweats, drowsiness, extreme hunger, fast heartbeat, headache, nausea, nervousness or anxiety, shakiness, trembling, unsteadiness, tiredness, or weakness. Contact your care team right away if you experience any of these symptoms. This medication may increase blood sugar. The risk may be higher in patients who already have diabetes. Ask your care team what you can  do to lower your risk of diabetes while taking this medication. You should make sure you get enough vitamin B12 while you are taking this medication. Discuss the foods you eat and the vitamins you take with your care team. What side effects may I notice from receiving this medication? Side effects that  you should report to your care team as soon as possible: Allergic reactions--skin rash, itching, hives, swelling of the face, lips, tongue, or throat Gallbladder problems--severe stomach pain, nausea, vomiting, fever Heart rhythm changes--fast or irregular heartbeat, dizziness, feeling faint or lightheaded, chest pain, trouble breathing High blood sugar (hyperglycemia)--increased thirst or amount of urine, unusual weakness or fatigue, blurry vision Low blood sugar (hypoglycemia)--tremors or shaking, anxiety, sweating, cold or clammy skin, confusion, dizziness, rapid heartbeat Low thyroid levels (hypothyroidism)--unusual weakness or fatigue, increased sensitivity to cold, constipation, hair loss, dry skin, weight gain, feelings of depression Low vitamin B12 level--pain, tingling, or numbness in the hands or feet, muscle weakness, dizziness, confusion, trouble concentrating Oily or light-colored stools, diarrhea, bloating, weight loss Pancreatitis--severe stomach pain that spreads to your back or gets worse after eating or when touched, fever, nausea, vomiting Slow heartbeat--dizziness, feeling faint or lightheaded, confusion, trouble breathing, unusual weakness or fatigue Side effects that usually do not require medical attention (report these to your care team if they continue or are bothersome): Diarrhea Dizziness Headache Nausea Pain, redness, or irritation at injection site Stomach pain This list may not describe all possible side effects. Call your doctor for medical advice about side effects. You may report side effects to FDA at 1-800-FDA-1088. Where should I keep my medication? Keep out of the reach of children and pets. Store in the refrigerator. Protect from light. Allow to come to room temperature naturally. Do not use artificial heat. If protected from light, the injection may be stored between 20 and 30 degrees C (70 and 86 degrees F) for 14 days. After the initial use, throw away  any unused portion of a multiple dose vial after 14 days. Get rid of any unused portions of the ampules after use. To get rid of medications that are no longer needed or have expired: Take the medication to a medication take-back program. Ask your pharmacy or law enforcement to find a location. If you cannot return the medication, ask your pharmacist or care team how to get rid of the medication safely. NOTE: This sheet is a summary. It may not cover all possible information. If you have questions about this medicine, talk to your doctor, pharmacist, or health care provider.  2024 Elsevier/Gold Standard (2023-10-01 00:00:00)

## 2024-10-10 DIAGNOSIS — H401131 Primary open-angle glaucoma, bilateral, mild stage: Secondary | ICD-10-CM | POA: Diagnosis not present

## 2024-11-02 ENCOUNTER — Encounter: Payer: Self-pay | Admitting: Oncology

## 2024-11-04 ENCOUNTER — Encounter: Payer: Self-pay | Admitting: Oncology

## 2024-11-06 ENCOUNTER — Inpatient Hospital Stay: Admitting: Oncology

## 2024-11-06 ENCOUNTER — Inpatient Hospital Stay: Attending: Physician Assistant

## 2024-11-06 ENCOUNTER — Inpatient Hospital Stay

## 2024-11-06 ENCOUNTER — Encounter: Payer: Self-pay | Admitting: Oncology

## 2024-11-06 VITALS — BP 130/77 | HR 73 | Temp 98.1°F | Resp 18 | Ht 62.0 in | Wt 165.7 lb

## 2024-11-06 DIAGNOSIS — C7A8 Other malignant neuroendocrine tumors: Secondary | ICD-10-CM

## 2024-11-06 DIAGNOSIS — C7B02 Secondary carcinoid tumors of liver: Secondary | ICD-10-CM | POA: Insufficient documentation

## 2024-11-06 DIAGNOSIS — C7A098 Malignant carcinoid tumors of other sites: Secondary | ICD-10-CM | POA: Insufficient documentation

## 2024-11-06 LAB — CBC WITH DIFFERENTIAL (CANCER CENTER ONLY)
Abs Immature Granulocytes: 0.04 K/uL (ref 0.00–0.07)
Basophils Absolute: 0.1 K/uL (ref 0.0–0.1)
Basophils Relative: 1 %
Eosinophils Absolute: 0 K/uL (ref 0.0–0.5)
Eosinophils Relative: 0 %
HCT: 44.2 % (ref 36.0–46.0)
Hemoglobin: 14.7 g/dL (ref 12.0–15.0)
Immature Granulocytes: 1 %
Lymphocytes Relative: 27 %
Lymphs Abs: 2 K/uL (ref 0.7–4.0)
MCH: 31.4 pg (ref 26.0–34.0)
MCHC: 33.3 g/dL (ref 30.0–36.0)
MCV: 94.4 fL (ref 80.0–100.0)
Monocytes Absolute: 0.5 K/uL (ref 0.1–1.0)
Monocytes Relative: 7 %
Neutro Abs: 4.9 K/uL (ref 1.7–7.7)
Neutrophils Relative %: 64 %
Platelet Count: 252 K/uL (ref 150–400)
RBC: 4.68 MIL/uL (ref 3.87–5.11)
RDW: 13.4 % (ref 11.5–15.5)
WBC Count: 7.5 K/uL (ref 4.0–10.5)
nRBC: 0 % (ref 0.0–0.2)

## 2024-11-06 LAB — CMP (CANCER CENTER ONLY)
ALT: 74 U/L — ABNORMAL HIGH (ref 0–44)
AST: 59 U/L — ABNORMAL HIGH (ref 15–41)
Albumin: 4.4 g/dL (ref 3.5–5.0)
Alkaline Phosphatase: 259 U/L — ABNORMAL HIGH (ref 38–126)
Anion gap: 10 (ref 5–15)
BUN: 16 mg/dL (ref 8–23)
CO2: 29 mmol/L (ref 22–32)
Calcium: 10.1 mg/dL (ref 8.9–10.3)
Chloride: 101 mmol/L (ref 98–111)
Creatinine: 0.9 mg/dL (ref 0.44–1.00)
GFR, Estimated: >60 mL/min
Glucose, Bld: 91 mg/dL (ref 70–99)
Potassium: 4.5 mmol/L (ref 3.5–5.1)
Sodium: 139 mmol/L (ref 135–145)
Total Bilirubin: 0.6 mg/dL (ref 0.0–1.2)
Total Protein: 7.4 g/dL (ref 6.5–8.1)

## 2024-11-06 LAB — LACTATE DEHYDROGENASE: LDH: 223 U/L (ref 105–235)

## 2024-11-06 MED ORDER — OCTREOTIDE ACETATE 20 MG IM KIT
20.0000 mg | PACK | Freq: Once | INTRAMUSCULAR | Status: AC
  Administered 2024-11-06: 20 mg via INTRAMUSCULAR
  Filled 2024-11-06: qty 1

## 2024-11-06 NOTE — Assessment & Plan Note (Addendum)
 Please review oncology history for additional details and timeline of events.  Ultrasound-guided biopsy of the liver mass on 07/26/2024 showed well-differentiated neuroendocrine tumor, WHO grade 2. Immunohistochemical stains show the cells were positive for CK AE1/AE3, synaptophysin, chromogranin A and PAX8. The cells are negative for TTF-1, CDX2, and HepPar1. Ki-67 proliferation index is approximately 16%.  Primary remains uncertain at this time.  Although neuroendocrine tumors are reported to originate from the liver, it is rare.  No carcinoid symptoms.  On 07/31/2024, chromogranin A was within normal limits at 55.6 ng/mL.  CA 19-9 was better at 49 units/mL, compared to 64 previously.   On 08/08/2024, staging PET dotatate scan showed large rounded liver mass measuring 7.2 x 8.5 cm, with focal intense DOTATATE uptake, compatible with well-differentiated neuroendocrine tumor.  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  No dotatate avid nodal or mesenteric disease.  Her case was discussed in our GI tumor conference.  Not a candidate for surgical resection given the location of the liver tumor and the size.  Referral previous sent to IR for liver directed therapy/Y90.  Appointment pending.  Given elevated LFTs and tumor burden, we started her on octreotide  injections from 08/14/2024.  Plan to continue monthly.  Labs reveal no dose-limiting toxicities.  Will proceed with octreotide  injection today.  Her LFTs have slowly improved since octreotide  has been initiated.  Lately they have been stable.  This could be from alcohol cessation as well.  Will continue to monitor.  Discussed her case with Dr. Hughes.  Plan is to proceed with CT abdomen pelvis with contrast prior to IR evaluation for consideration of Y90 treatments.  Request placed for CT scan today.

## 2024-11-06 NOTE — Patient Instructions (Signed)
 Octreotide Injection Suspension What is this medication? OCTREOTIDE (ok TREE oh tide) treats high levels of growth hormone (acromegaly). It works by reducing the amount of growth hormone your body makes. This reduces symptoms and the risk of health problems caused by too much growth hormone, such as diabetes and heart disease. It may also be used to treat diarrhea caused by neuroendocrine tumors. It works by slowing down the release of serotonin from the tumor cells. This reduces the number of bowel movements you have. This medicine may be used for other purposes; ask your health care provider or pharmacist if you have questions. COMMON BRAND NAME(S): Sandostatin LAR What should I tell my care team before I take this medication? They need to know if you have any of these conditions: Diabetes Gallbladder disease Heart disease Kidney disease Liver disease Pancreatic disease Thyroid disease An unusual or allergic reaction to octreotide, other medications, foods, dyes, or preservatives Pregnant or trying to get pregnant Breastfeeding How should I use this medication? This medication is injected into a muscle. It is usually given by your care team in a hospital or clinic setting. Talk to your care team about the use of this medication in children. Special care may be needed. Overdosage: If you think you have taken too much of this medicine contact a poison control center or emergency room at once. NOTE: This medicine is only for you. Do not share this medicine with others. What if I miss a dose? Keep appointments for follow-up doses. It is important not to miss your dose. Call your care team if you are unable to keep an appointment. What may interact with this medication? Do not take this medication with any of the following: Cisapride Dronedarone Flibanserin Lutetium Lu 177 dotatate Pimozide Saquinavir Thioridazine This medication may also interact with the  following: Bromocriptine Certain medications for blood pressure, heart disease, irregular heartbeat Cyclosporine Diuretics Medications for diabetes, including insulin Quinidine This list may not describe all possible interactions. Give your health care provider a list of all the medicines, herbs, non-prescription drugs, or dietary supplements you use. Also tell them if you smoke, drink alcohol, or use illegal drugs. Some items may interact with your medicine. What should I watch for while using this medication? Visit your care team for regular checks on your progress. Tell your care team if your symptoms do not start to get better or if they get worse. This medication may cause decreases in blood sugar. Signs of low blood sugar include chills, cool, pale skin or cold sweats, drowsiness, extreme hunger, fast heartbeat, headache, nausea, nervousness or anxiety, shakiness, trembling, unsteadiness, tiredness, or weakness. Contact your care team right away if you experience any of these symptoms. This medication may increase blood sugar. The risk may be higher in patients who already have diabetes. Ask your care team what you can do to lower your risk of diabetes while taking this medication. You should make sure you get enough vitamin B12 while you are taking this medication. Discuss the foods you eat and the vitamins you take with your care team. What side effects may I notice from receiving this medication? Side effects that you should report to your care team as soon as possible: Allergic reactions--skin rash, itching, hives, swelling of the face, lips, tongue, or throat Gallbladder problems--severe stomach pain, nausea, vomiting, fever Heart rhythm changes--fast or irregular heartbeat, dizziness, feeling faint or lightheaded, chest pain, trouble breathing High blood sugar (hyperglycemia)--increased thirst or amount of urine, unusual weakness or fatigue,  blurry vision Low blood sugar  (hypoglycemia)--tremors or shaking, anxiety, sweating, cold or clammy skin, confusion, dizziness, rapid heartbeat Low thyroid levels (hypothyroidism)--unusual weakness or fatigue, increased sensitivity to cold, constipation, hair loss, dry skin, weight gain, feelings of depression Low vitamin B12 level--pain, tingling, or numbness in the hands or feet, muscle weakness, dizziness, confusion, trouble concentrating Oily or light-colored stools, diarrhea, bloating, weight loss Pancreatitis--severe stomach pain that spreads to your back or gets worse after eating or when touched, fever, nausea, vomiting Slow heartbeat--dizziness, feeling faint or lightheaded, confusion, trouble breathing, unusual weakness or fatigue Side effects that usually do not require medical attention (report these to your care team if they continue or are bothersome): Diarrhea Dizziness Headache Nausea Pain, redness, or irritation at injection site Stomach pain This list may not describe all possible side effects. Call your doctor for medical advice about side effects. You may report side effects to FDA at 1-800-FDA-1088. Where should I keep my medication? This medication is given in a hospital or clinic. It will not be stored at home. NOTE: This sheet is a summary. It may not cover all possible information. If you have questions about this medicine, talk to your doctor, pharmacist, or health care provider.  2024 [employer] (2023-10-01 00:00:00)

## 2024-11-06 NOTE — Progress Notes (Addendum)
 "   [facility]  ONCOLOGY CLINIC PROGRESS NOTE   Patient Care Team: Job Lukes, [place] as PCP - General (Physician Assistant) Gladis, Inocente Coy, NP as Nurse Practitioner (Neurology) Cyrus Carwin, MD as Consulting Physician (Ophthalmology) Dohmeier, Dedra, MD as Consulting Physician (Neurology) [facility], [facility] Keri Pandora Cadet, [facility] as Pharmacist (Pharmacist) Autumn Millman, MD as Consulting Physician (Oncology)  PATIENT NAME: Nicole Holt   MR#: [id number] DOB: 04/28/1941  Date of visit: 11/06/2024   ASSESSMENT & PLAN:   Nicole Holt is a 84 y.o. 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  Primary well differentiated neuroendocrine tumor of pancreas [facility]) Please review oncology history for additional details and timeline of events.  Ultrasound-guided biopsy of the liver mass on 07/26/2024 showed well-differentiated neuroendocrine tumor, WHO grade 2. Immunohistochemical stains show the cells were positive for CK AE1/AE3, synaptophysin, chromogranin A and PAX8. The cells are negative for TTF-1, CDX2, and HepPar1. Ki-67 proliferation index is approximately 16%.  Primary remains uncertain at this time.  Although neuroendocrine tumors are reported to originate from the liver, it is rare.  No carcinoid symptoms.  On 07/31/2024, chromogranin A was within normal limits at 55.6 ng/mL.  CA 19-9 was better at 49 units/mL, compared to 64 previously.   On  08/08/2024, staging PET dotatate scan showed large rounded liver mass measuring 7.2 x 8.5 cm, with focal intense DOTATATE uptake, compatible with well-differentiated neuroendocrine tumor.  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  No dotatate avid nodal or mesenteric disease.  Her case was discussed in our GI tumor conference.  Not a candidate for surgical resection given the location of the liver tumor and the size.  Referral previous sent to IR for liver directed therapy/Y90.  Appointment pending.  Given elevated LFTs and tumor burden, we started her on octreotide  injections from 08/14/2024.  Plan to continue monthly.  Labs reveal no dose-limiting toxicities.  Will proceed with octreotide  injection today.  Her LFTs have slowly improved since octreotide  has been initiated.  Lately they have been stable.  This could be from alcohol cessation as well.  Will continue to monitor.  Discussed her case with Dr. Hughes.  Plan is to proceed with CT abdomen pelvis with contrast prior to IR evaluation for consideration of Y90 treatments.  Request placed for CT scan today.   Abnormal liver function tests, improving Liver function tests show fluctuating alkaline phosphatase levels, currently at 259, with previous peaks at 520. AST and ALT levels have improved, with AST at 59 and ALT at 74. No recent alcohol consumption reported. Liver function tests will be monitored closely. - Continue to monitor liver function tests, particularly alkaline phosphatase, AST, and ALT levels.  Chronic constipation Long-standing issue with constipation, not directly linked to the liver tumor. No evidence of intestinal blockage on scans. Previously managed with Miralax, prune juice, and suppositories, but these are no longer in use. Constipation symptoms have improved. - Encourage hydration and dietary adjustments  I reviewed lab results and outside records  for this visit and discussed relevant results with the patient. Diagnosis, plan of care and treatment options were also discussed in detail with the patient. Opportunity provided to ask questions and answers provided to her apparent satisfaction. Provided instructions to call our clinic with any problems, questions or concerns prior to return visit. I recommended to continue follow-up with PCP and sub-specialists. She verbalized understanding and agreed with the plan.   NCCN guidelines have been consulted in the planning of this patients care.  I spent a total of 32 minutes during this encounter with the patient including review of chart and various tests results, discussions about plan of care and coordination of care plan.  Chinita Patten, MD  11/06/2024 5:08 PM  [facility] - A DEPT OF 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   Current Treatment: Started octreotide  injections from 08/14/2024.  Not a candidate for surgery.   INTERVAL HISTORY:    Discussed the use of AI scribe software for clinical note transcription with the patient, who gave verbal consent to proceed.  History of Present Illness  Nicole Holt is an 84 year old female with metastatic neuroendocrine tumor involving the pancreas and liver who presents for ongoing oncology management and evaluation for Y90 radioembolization.  Prior PET scan has shown at least two hepatic lesions, with the largest measuring 8.5 cm. The primary tumor is in the pancreas and there is metastatic disease in the liver. The largest hepatic lesion identified on prior PET scan measures 8.5 cm. She has been receiving monthly injections for approximately four months. No interval  imaging has been performed since starting injections. She is awaiting evaluation by interventional radiology and has not yet been contacted by the IR team.  She describes persistent facial flushing and erythema, which she attributes to rosacea but recognizes may be related to neuroendocrine tumor activity. Flushing episodes are exacerbated by anxiety or stress, with her face becoming intensely red and burning, accompanied by a sensation of heat. She denies diarrhea or loose stools, but has ongoing constipation, which she associates with her injection therapy. She previously used polyethylene glycol but switched to magnesium hydroxide and prune juice after consulting with a nutritionist, and has increased her intake of fruits and water . She denies issues with appetite or eating.  She expresses difficulty comprehending her care and is accompanied by her grandson for support and assistance with communication. She requests assistance in coordinating care due to delays in communication from the IR team and prefers to have her grandson present for future visits to help with understanding and decision-making.     I have reviewed the past medical history, past surgical history, social history and family history with the patient and they are unchanged from previous note.  HISTORY OF PRESENT ILLNESS:   ONCOLOGY HISTORY:   Patient had a follow-up appointment with her PCP on 07/04/2024. At that visit she reported bowel changes with constipation and lower abdominal pain so CT AP was ordered. CT AP performed on 07/11/24 showed a 7.5 x 8.2 cm heterogeneous lesion in the central liver.   MR liver was subsequently performed on 07/15/24 showing large mass situated in the central liver closely abutting the IVC and centered in hepatic segment IV, measuring 8.3 x 6.6 cm. No evidence of  lymphadenopathy or metastatic disease in the abdomen. Mass effect distorts and compresses the hepatic hilum with consequent intrahepatic  biliary ductal dilatation. 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  Hepatic function panel was collected 07/13/24 with results significant for elevated T bili 2.4, alkaline phosphatase 495, AST/ALT 160/271.    Ultrasound-guided biopsy of the liver mass on 07/26/2024 showed well-differentiated neuroendocrine tumor, WHO grade 2. Immunohistochemical stains show the cells were positive for CK AE1/AE3, synaptophysin, chromogranin A and PAX8. The cells are negative for TTF-1, CDX2, and HepPar1. Ki-67 proliferation index is approximately 16%.  The overall findings are consistent with a well-differentiated neuroendocrine tumor. The primary site could potentially be the pancreas, upper gastrointestinal tract, duodenum, or rectum, among others. Although the appendiceal region and gynecologic system (including carcinoid arising in teratoma) remain possible sites, they are considered less likely. The existence of a primary hepatic neuroendocrine tumor remains controversial; however, if no primary tumor is identified elsewhere, a primary hepatic origin should be considered. Correlation with radiological imaging is recommended for further evaluation.    Primary remains uncertain at this time.  Although neuroendocrine tumors are reported to originate from the liver, it is rare.  On 07/31/2024, chromogranin A was within normal limits at 55.6 ng/mL.  CA 19-9 was better at 49 units/mL, compared to 64 previously.   On 08/08/2024, staging PET dotatate scan showed large rounded liver mass measuring 7.2 x 8.5 cm, with focal intense DOTATATE uptake, compatible with well-differentiated neuroendocrine tumor.  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  No dotatate avid nodal or  mesenteric disease.  Her case was discussed in our GI tumor conference.  Not a candidate for surgical resection given the location of the liver tumor and the size.  Referral sent to IR for liver directed therapy/Y90.    Given elevated LFTs and tumor burden, we started her on octreotide  injections from 08/14/2024.  Plan to continue monthly.  REVIEW OF SYSTEMS:   Review of Systems - Oncology  All other pertinent systems were reviewed with the patient and are negative.  ALLERGIES: She is allergic to codeine, estrogens conjugated, and paroxetine hcl.  MEDICATIONS:  Current Outpatient Medications  Medication Sig Dispense Refill   busPIRone  (BUSPAR ) 5 MG tablet Take 1 tablet (5 mg total) by mouth 2 (two) times daily as needed (anxiety). (Patient not taking: Reported on 10/09/2024) 30 tablet 1   diazepam  (VALIUM ) 5 MG tablet Take 1 tablet (5 mg total) by mouth every 6 (six) hours as needed. for anxiety (Patient not taking: Reported on 10/09/2024) 30 tablet 0   fluticasone  (FLONASE ) 50 MCG/ACT nasal spray Use 1 spray(s) in each nostril once daily (Patient not taking: Reported on 10/09/2024) 16 g 0   latanoprost (XALATAN) 0.005 % ophthalmic solution      traMADol  (ULTRAM ) 50 MG tablet Take 1 tablet (50 mg total) by mouth every 8 (eight) hours as needed. (Patient not taking: Reported on 10/09/2024) 60 tablet 0   No current facility-administered medications for this visit.     VITALS:   Blood pressure 130/77, pulse 73, temperature 98.1 F (36.7 C), temperature source Temporal, resp. rate 18, height 5' 2 (1.575 m), weight 165 lb 11.2 oz (75.2 kg), SpO2 98%.  Wt Readings from Last 3 Encounters:  11/06/24 165 lb 11.2 oz (75.2 kg)  10/09/24 163 lb  4.8 oz (74.1 kg)  09/11/24 164 lb 9.6 oz (74.7 kg)    Body mass index is 30.31 kg/m.    Onc Performance Status - 11/06/24 1700       ECOG Perf Status   ECOG Perf Status Ambulatory and capable of all selfcare but unable to carry out any work  activities.  Up and about more than 50% of waking hours      KPS SCALE   KPS % SCORE Normal activity with effort, some s/s of disease           PHYSICAL EXAM:   Physical Exam Constitutional:      General: She is not in acute distress.    Appearance: Normal appearance.  HENT:     Head: Normocephalic and atraumatic.  Cardiovascular:     Rate and Rhythm: Normal rate.     Heart sounds: Normal heart sounds.  Pulmonary:     Effort: Pulmonary effort is normal. No respiratory distress.     Breath sounds: Normal breath sounds.  Abdominal:     General: There is no distension.  Neurological:     General: No focal deficit present.     Mental Status: She is alert and oriented to person, place, and time.  Psychiatric:        Mood and Affect: Mood normal.        Behavior: Behavior normal.      LABORATORY DATA:   I have reviewed the data as listed.  Results for orders placed or performed in visit on 11/06/24  Lactate dehydrogenase  Result Value Ref Range   LDH 223 105 - 235 U/L  CMP (Cancer Center only)  Result Value Ref Range   Sodium 139 135 - 145 mmol/L   Potassium 4.5 3.5 - 5.1 mmol/L   Chloride 101 98 - 111 mmol/L   CO2 29 22 - 32 mmol/L   Glucose, Bld 91 70 - 99 mg/dL   BUN 16 8 - 23 mg/dL   Creatinine 9.09 9.55 - 1.00 mg/dL   Calcium  10.1 8.9 - 10.3 mg/dL   Total Protein 7.4 6.5 - 8.1 g/dL   Albumin 4.4 3.5 - 5.0 g/dL   AST 59 (H) 15 - 41 U/L   ALT 74 (H) 0 - 44 U/L   Alkaline Phosphatase 259 (H) 38 - 126 U/L   Total Bilirubin 0.6 0.0 - 1.2 mg/dL   GFR, Estimated >39 >39 mL/min   Anion gap 10 5 - 15  CBC with Differential (Cancer Center Only)  Result Value Ref Range   WBC Count 7.5 4.0 - 10.5 K/uL   RBC 4.68 3.87 - 5.11 MIL/uL   Hemoglobin 14.7 12.0 - 15.0 g/dL   HCT 55.7 63.9 - 53.9 %   MCV 94.4 80.0 - 100.0 fL   MCH 31.4 26.0 - 34.0 pg   MCHC 33.3 30.0 - 36.0 g/dL   RDW 86.5 88.4 - 84.4 %   Platelet Count 252 150 - 400 K/uL   nRBC 0.0 0.0 - 0.2 %    Neutrophils Relative % 64 %   Neutro Abs 4.9 1.7 - 7.7 K/uL   Lymphocytes Relative 27 %   Lymphs Abs 2.0 0.7 - 4.0 K/uL   Monocytes Relative 7 %   Monocytes Absolute 0.5 0.1 - 1.0 K/uL   Eosinophils Relative 0 %   Eosinophils Absolute 0.0 0.0 - 0.5 K/uL   Basophils Relative 1 %   Basophils Absolute 0.1 0.0 - 0.1 K/uL   Immature  Granulocytes 1 %   Abs Immature Granulocytes 0.04 0.00 - 0.07 K/uL     RADIOGRAPHIC STUDIES:  I have personally reviewed the radiological images as listed and agree with the findings in the report.  NM PET DOTATATE SKULL BASE TO MID THIGH Addendum: ADDENDUM REPORT: 08/24/2024 08:28   ADDENDUM:  Upon further review, there is DOTATATE activity in the tail of the  pancreas which is subtly evident on the background of intense  radiotracer activity within the splenic hilum. Findings concerning  for neuroendocrine tumor of the pancreatic tail. This certainly  could represent a primary neuroendocrine tumor. Findings discussed  at GI tumor board.   Electronically Signed    By: Jackquline Boxer M.D.    On: 08/24/2024 08:28 Narrative: CLINICAL DATA:  EXAM: PET AND CT SKULL BASE TO MID THIGH 08/08/2024 04:36:38 PM  TECHNIQUE:  RADIOPHARMACEUTICAL: 4.178 mCi copper -64 DOTATATE Uptake time 60 minutes.  PET imaging was acquired from the base of the skull to the mid thighs. Non-contrast enhanced computed tomography was obtained for attenuation correction and anatomic localization.  COMPARISON: CT 00/00/2025 and MRI 00/13/2025.  CLINICAL HISTORY: Neuroendocrine tumor. 4.178 mCi Cu64 Dotatate IV RAC @ 1504 MM; PET Dotatate Scan for well differentiated neuroendocrine tumor; No short acting somatostatin; no long acting somatostatin; EOV.  FINDINGS:  HEAD AND NECK: Physiologic activity within the pituitary gland, salivary glands, and thyroid  gland. No DOTATATE-avid cervical lymphadenopathy.  CHEST: No DOTATATE-avid pulmonary nodules or  lymphadenopathy.  ABDOMEN AND PELVIS: Physiologic activity within the spleen, adrenal glands, kidneys, and bowel. There is a large rounded mass in the liver, typical to define on a noncontrast CT, measuring approximately 7.2 x 8.5 cm. The majority of the mass is iso intense to the liver parenchyma; however, along the dorsal medial margin of the mass there is a focus of intense DOTATATE activity significantly above liver activity with a maximum standardized uptake value (SUVmax) of 22.3 on image 98. This region measures approximately 4 cm. No DOTATATE-avid nodules within the mesentery. No DOTATATE-avid abdominal pelvic lymph nodes. Fluid distention of the appendix is again noted measuring 16 mm in diameter on image 136. This fluid distended appendix does not have DOTATATE activity. Cystic lesion in the left adnexal region measuring 4.2 cm does not demonstrate DOTATATE activity.  BONES AND SOFT TISSUE: No DOTATATE activity within the bones.  IMPRESSION: 1. Large rounded liver mass with focal intense DOTATATE uptake compatible with well differentiaed neuroendocrine tumor. 2. No DOTATATE-avid nodal or mesenteric disease. 3. Fluid-distended appendix without DOTATATE uptake. 4. Left adnexal cystic lesion without DOTATATE uptake.  Electronically signed by: Norleen Boxer MD 08/10/2024 03:49 PM EDT RP Workstation: [id number]  Electronically Signed   By: Jackquline Boxer M.D.   On: 08/24/2024 08:28   CODE STATUS:  Code Status History     Date Active Date Inactive Code Status Order ID Comments User Context   07/26/2024 0933 07/27/2024 0513 Full Code [id number]  Jennefer Ester PARAS, MD [facility]    Questions for Most Recent Historical Code Status (Order [id number])     Question Answer   By: Consent: discussion documented in EHR            Orders Placed This Encounter  Procedures   CT ABDOMEN PELVIS W CONTRAST    Standing Status:   Future    Expected Date:   11/07/2024     Expiration Date:   11/06/2025    If indicated for the ordered procedure, I authorize the administration of contrast media per  Radiology protocol:   Yes    Does the patient have a contrast media/X-ray dye allergy?:   No    Preferred imaging location?:   [facility]    If indicated for the ordered procedure, I authorize the administration of oral contrast media per Radiology protocol:   Yes     Future Appointments  Date Time Provider Department Center  11/08/2024  2:30 PM Tykisha, Areola, DPM [facility]  11/27/2024  1:00 PM [facility] ANNUAL WELLNESS VISIT 1 [facility] Milian  12/04/2024  2:15 PM [facility] PHLEBOTOMIST [facility] None  12/04/2024  2:30 PM Kalin Kyler, MD [facility] None  12/04/2024  3:00 PM [facility] INFUSION [facility] None  01/01/2025  2:15 PM [facility] PHLEBOTOMIST [facility] None  01/01/2025  2:30 PM Leotha Voeltz, MD [facility] None  01/01/2025  3:00 PM [facility] INFUSION [facility] None  01/02/2025  1:15 PM McCue, Harlene, NP [facility] None     This document was completed utilizing speech recognition software. Grammatical errors, random word insertions, pronoun errors, and incomplete sentences are an occasional consequence of this system due to software limitations, ambient noise, and hardware issues. Any formal questions or concerns about the content, text or information contained within the body of this dictation should be directly addressed to the provider for clarification.   "

## 2024-11-06 NOTE — Addendum Note (Signed)
 Addended by: Kariyah Baugh on: 11/06/2024 05:09 PM   Modules accepted: Orders

## 2024-11-08 ENCOUNTER — Ambulatory Visit
Admission: RE | Admit: 2024-11-08 | Discharge: 2024-11-08 | Disposition: A | Discharge status: Home / Self Care | Source: Ambulatory Visit | Attending: Oncology | Admitting: Oncology

## 2024-11-08 ENCOUNTER — Other Ambulatory Visit: Payer: Self-pay | Admitting: Oncology

## 2024-11-08 ENCOUNTER — Ambulatory Visit: Admitting: Podiatry

## 2024-11-08 DIAGNOSIS — C7A8 Other malignant neuroendocrine tumors: Secondary | ICD-10-CM

## 2024-11-08 DIAGNOSIS — Q828 Other specified congenital malformations of skin: Secondary | ICD-10-CM

## 2024-11-08 HISTORY — PX: IR RADIOLOGIST EVAL & MGMT: IMG5224

## 2024-11-08 NOTE — Progress Notes (Signed)
 "  Subjective:  Patient ID: Nicole Holt, female    DOB: 06-17-1941,  MRN: [id number]  Chief Complaint  Patient presents with   Callouses    Pt stated that she has been soaking her feet and pulling out these seeds on the bottom of her foot she believes that they are corn seeds     84 y.o. female presents with the above complaint.  Patient presents with bilateral submetatarsal 5 porokeratotic lesion with central nucleated core painful to touch is progressing and worse worse with ambulation or shoe pressure she states it hurts when she is stepping on her foot.  She tries to pull out the seed herself.  Gives her some temporary relief just want to get it discussed treatment options for this.   Review of Systems: Negative except as noted in the HPI. Denies N/V/F/Ch.  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    Osteoporosis    Sleep apnea    Tinnitus    Current Medications[1]  Tobacco Use History[2]  Allergies[3] Objective:  There were no vitals filed for this visit. There is no height or weight on file to calculate BMI. Constitutional Well developed. Well nourished.  Vascular Dorsalis pedis pulses palpable bilaterally. Posterior tibial pulses palpable bilaterally. Capillary refill normal to all digits.  No cyanosis or clubbing noted. Pedal hair growth normal.  Neurologic Normal speech. Oriented to person, place, and time. Epicritic sensation to light touch grossly present bilaterally.  Dermatologic Bilateral submet 5 porokeratosis with central nucleated core noted pain on palpation no pinpoint bleeding  noted.  Orthopedic: Normal joint ROM without pain or crepitus bilaterally. No visible deformities. No bony tenderness.   Radiographs: None Assessment:   1. Porokeratosis    Plan:  Patient was evaluated and treated and all questions answered.  Bilateral submet 5 porokeratosis - All questions and concerns were discussed with the patient in extensive detail given the presence of porokeratotic lesion patient will benefit from debridement of the lesion using chisel blade handle the lesion was debride down to healthy dry tissue no complication noted no pinpoint bleeding noted -shoesgear modification was discussed   No follow-ups on file.    [1]  Current Outpatient Medications:    busPIRone  (BUSPAR ) 5 MG tablet, Take 1 tablet (5 mg total) by mouth 2 (two) times daily as needed (anxiety). (Patient not taking: Reported on 10/09/2024), Disp: 30 tablet, Rfl: 1   diazepam  (VALIUM ) 5 MG tablet, Take 1 tablet (5 mg total) by mouth every 6 (six) hours as needed. for anxiety (Patient not taking: Reported on 10/09/2024), Disp: 30 tablet, Rfl: 0   fluticasone  (FLONASE ) 50 MCG/ACT nasal spray, Use 1 spray(s) in each nostril once daily (Patient not taking: Reported on 10/09/2024), Disp: 16 g, Rfl: 0   latanoprost (XALATAN) 0.005 % ophthalmic solution, , Disp: , Rfl:    traMADol  (ULTRAM ) 50 MG tablet, Take 1 tablet (50 mg total) by mouth every 8 (eight) hours as needed. (Patient not taking:  Reported on 10/09/2024), Disp: 60 tablet, Rfl: 0 [2]  Social History Tobacco Use  Smoking Status Former  Smokeless Tobacco Never  Tobacco Comments   Quit in 1987  [3]  Allergies Allergen Reactions   Codeine Nausea Only   Estrogens Conjugated Other (See Comments)    HA   Paroxetine Hcl Other (See Comments)    Felt like she was out of her head   "

## 2024-11-08 NOTE — Progress Notes (Addendum)
 "   Reason for visit: Metastatic NET. Evaluate for liver therapy.   Care Team(s) PCP: Job Lukes, PA Medical Oncology: Autumn Millman MD  Virtual Visit via Video Conferencing  I connected with Nicole Holt on 11/08/2024 by video-telephonic conference and verified that I am speaking with the correct person using two identifiers. I discussed the limitations, risks, security and privacy concerns of performing an evaluation and management service by tele-visit and the availability of in-person appointments. As part of this video-telephonic encounter, no in-person exam was conducted.  History of Present Illness:  Nicole Holt is a 84 y.o. female w PMHx significant for anxiety who presented to her PCP in 07/2024 w constipation and a change in BMs w acholic stools. Her workup included a labs, notable for >LFTs w a T bili of 2.4. A CT AP (07/11/24) revealed a large liver mass and follow up US  Abd (07/14/24) demonstrated vascularity of the mass. Definitive imaging w MR Abd (07/15/24) revealed 8 cm mass at hepatic segment IV suspicious for a primary liver tumor however biopsy (07/26/24), performed by my colleague Dr Jennefer, confirmed well-differentiated NET. Pt was seen by Oncology, Dr Autumn, and a staging DOTATATE PET (08/08/24) was remarkable for activity at the pancreatic tail suspicious for primary pancreatic NET. She was presented to multi-disciplinary conference and given her age was recommended for VIR referral for locoregional treatment. She was began on octreotide , q 28 days, on 08/14/24 and is tolerating the treatment well. Her bilirubin has normalized.   She denies any abdominal pain but experiences residual constipation, managed symptomatically. She reports feeling well and has changed her lifestyle since diagnosis, with an improved diet and  EtOH cessation in 06/2024. She is a retired airline pilot and reportedly completes all her ADLs without difficulty. ROS was negative for unintended wt loss,  N/V.  Review of Systems: A 12-point ROS discussed, and pertinent positives are indicated in the HPI above.  All other systems are negative  Past Medical History:  Diagnosis Date   Allergy    Anxiety    panic attack   Depression    GERD (gastroesophageal reflux disease)    Glaucoma    Glaucoma    Hearing loss    History of basal cell cancer 08/19/2012   Removed from nose   Hyperlipidemia    Hypertensive retinopathy    OU   Migraines    ocular   OSA on CPAP 02/02/2014   PSG 09-27-2012 RDI 18.9 and AHi 9.8, Epworth 15 .titrated to only 5 cm water .    Osteoporosis    Sleep apnea    Tinnitus     Past Surgical History:  Procedure Laterality Date   AIR/FLUID EXCHANGE Right 09/12/2020   Procedure: AIR/FLUID EXCHANGE;  Surgeon: Valdemar Rogue, MD;  Location: [facility] OR;  Service: Ophthalmology;  Laterality: Right;   CATARACT EXTRACTION Bilateral OD: 10.25.21 OS: 12.2015   Dr. Cyrus   cataract surgery     COSMETIC SURGERY     EYE SURGERY Bilateral    Cat Sx - Dr. Cyrus   EYE SURGERY Right 09/12/2020   PPV/EL for retained lens material - Dr. Rogue Valdemar   PARS PLANA VITRECTOMY Right 09/12/2020   Procedure: PARS PLANA VITRECTOMY WITH 23 GAUGE;  Surgeon: Valdemar Rogue, MD;  Location: [facility] OR;  Service: Ophthalmology;  Laterality: Right;   PHOTOCOAGULATION WITH LASER Right 09/12/2020   Procedure: PHOTOCOAGULATION WITH LASER;  Surgeon: Valdemar Rogue, MD;  Location: [facility] OR;  Service: Ophthalmology;  Laterality: Right;  REMOVAL RETAINED LENS Right 09/12/2020   Procedure: REMOVAL RETAINED LENS;  Surgeon: Valdemar Rogue, MD;  Location: [facility] OR;  Service: Ophthalmology;  Laterality: Right;    Allergies: Codeine, Estrogens conjugated, and Paroxetine hcl  Medications: Prior to Admission medications  Medication Sig Start Date End Date Taking? Authorizing Provider  busPIRone  (BUSPAR ) 5 MG tablet Take 1 tablet (5 mg total) by mouth 2 (two) times daily as needed (anxiety). Patient not taking:  Reported on 10/09/2024 07/04/24   Job Lukes, PA  diazepam  (VALIUM ) 5 MG tablet Take 1 tablet (5 mg total) by mouth every 6 (six) hours as needed. for anxiety Patient not taking: Reported on 10/09/2024 07/04/24   Job Lukes, PA  fluticasone  (FLONASE ) 50 MCG/ACT nasal spray Use 1 spray(s) in each nostril once daily Patient not taking: Reported on 10/09/2024 09/07/23   Job Lukes, PA  latanoprost (XALATAN) 0.005 % ophthalmic solution  05/16/21   [provider]  traMADol  (ULTRAM ) 50 MG tablet Take 1 tablet (50 mg total) by mouth every 8 (eight) hours as needed. Patient not taking: Reported on 10/09/2024 07/31/24   Autumn Millman, MD     Family History  Problem Relation Age of Onset   Osteoporosis Mother    Stroke Mother    Hyperlipidemia Mother    Hypertension Mother    Kidney disease Father    Congestive Heart Failure Father    Heart disease Father    Hyperlipidemia Father    Stroke Sister    Parkinson's disease Sister    Heart disease Sister    Heart disease Maternal Grandmother    Gout Maternal Grandmother    Stroke Maternal Grandfather    Stroke Paternal Grandmother    Thrombosis Paternal Grandfather    Early death Paternal Grandfather    Hypertension Brother    Diabetes Brother    Arthritis/Rheumatoid Other    Parkinson's disease Other    Heart disease Other    Obesity Daughter    Breast cancer Neg Hx     Social History   Socioeconomic History   Marital status: Married    Spouse name: Ozell   Number of children: 2   Years of education: 16   Highest education level: Some college, no degree  Occupational History   Occupation: Retired  Tobacco Use   Smoking status: Former   Smokeless tobacco: Never   Tobacco comments:    Quit in 1987  Vaping Use   Vaping status: Never Used  Substance and Sexual Activity   Alcohol use: Not Currently    Alcohol/week: 12.0 standard drinks of alcohol    Types: 12 Glasses of wine per week    Comment: wine or  alcohol daily   Drug use: No   Sexual activity: Yes    Birth control/protection: Post-menopausal  Other Topics Concern   Not on file  Social History Narrative   Patient is married Tatiana) and lives with her husband.Patient is retired.Patient has two children.Patient is right-handed.Patient has a college education.Patient drinks two cups of coffee daily.      Right handed    Drinks 2 cups per day    Social Drivers of Health   Tobacco Use: Medium Risk (11/06/2024)   Patient History    Smoking Tobacco Use: Former    Smokeless Tobacco Use: Never    Passive Exposure: Not on file  Financial Resource Strain: Low Risk (11/23/2023)   Overall Financial Resource Strain (CARDIA)    Difficulty of Paying Living Expenses: Not hard at all  Food Insecurity: No Food Insecurity (07/31/2024)   Epic    Worried About Programme Researcher, Broadcasting/film/video in the Last Year: Never true    Ran Out of Food in the Last Year: Never true  Transportation Needs: No Transportation Needs (07/31/2024)   Epic    Lack of Transportation (Medical): No    Lack of Transportation (Non-Medical): No  Physical Activity: Insufficiently Active (11/23/2023)   Exercise Vital Sign    Days of Exercise per Week: 2 days    Minutes of Exercise per Session: 60 min  Stress: Stress Concern Present (11/23/2023)   [employer] of Occupational Health - Occupational Stress Questionnaire    Feeling of Stress : To some extent  Social Connections: Moderately Isolated (11/23/2023)   Social Connection and Isolation Panel    Frequency of Communication with Friends and Family: Once a week    Frequency of Social Gatherings with Friends and Family: More than three times a week    Attends Religious Services: Never    Database Administrator or Organizations: No    Attends Banker Meetings: Never    Marital Status: Married  Depression (PHQ2-9): Low Risk (10/09/2024)   Depression (PHQ2-9)    PHQ-2 Score: 4  Alcohol Screen: Not on file  Housing:  Unknown (07/31/2024)   Epic    Unable to Pay for Housing in the Last Year: No    Number of Times Moved in the Last Year: Not on file    Homeless in the Last Year: No  Utilities: Not At Risk (07/31/2024)   Epic    Threatened with loss of utilities: No  Health Literacy: Adequate Health Literacy (11/23/2023)   B1300 Health Literacy    Frequency of need for help with medical instructions: Never    ECOG Status: 1 - Symptomatic but completely ambulatory  Review of Systems As above  Vital Signs: There were no vitals taken for this visit.  Physical Exam Deferred secondary to virtual visit.  Imaging: No results found.    Imaging independently reviewed, demonstrating 8 cm enhancing mass at hepatic segment IV   NM DOTATATE PET (08/08/24) IMPRESSION:  1. Large rounded liver mass with focal intense DOTATATE uptake compatible with well differentiaed neuroendocrine tumor.  2. No DOTATATE-avid nodal or mesenteric disease.  3. Fluid-distended appendix without DOTATATE uptake.  4. Left adnexal cystic lesion without DOTATATE uptake.    ADDENDUM: Upon further review, there is DOTATATE activity in the tail of the pancreas which is subtly evident on the background of intense radiotracer activity within the splenic hilum. Findings concerning for neuroendocrine tumor of the pancreatic tail. This certainly could represent a primary neuroendocrine tumor. Findings discussed at GI tumor board.   Labs:  CBC: Recent Labs    08/14/24 1107 09/11/24 1135 10/09/24 0903 11/06/24 1410  WBC 5.0 6.8 8.1 7.5  HGB 13.7 14.6 15.4* 14.7  HCT 39.7 44.0 46.2* 44.2  PLT 280 242 275 252    COAGS: Recent Labs    07/13/24 1038 07/26/24 0728  INR 1.1* 1.0    BMP: Recent Labs    08/14/24 1107 09/11/24 1135 10/09/24 0903 11/06/24 1410  NA 140 138 141 139  K 4.4 4.3 4.1 4.5  CL 104 101 101 101  CO2 25 27 27 29   GLUCOSE 108* 122* 109* 91  BUN 17 18 20 16   CALCIUM  10.0 10.0 10.5* 10.1   CREATININE 0.97 0.90 0.97 0.90  GFRNONAA 58* >60 58* >60    LIVER FUNCTION TESTS:  Recent Labs    08/14/24 1107 09/11/24 1135 10/09/24 0903 11/06/24 1410  BILITOT 1.1 0.7 0.5 0.6  AST 44* 44* 50* 59*  ALT 43 51* 53* 74*  ALKPHOS 202* 151* 241* 259*  PROT 7.0 7.4 7.8 7.4  ALBUMIN 4.2 4.5 4.5 4.4   Computed MELD 3.0 unavailable. One or more values for this score either were not found within the given timeframe or did not fit some other criterion. Computed MELD-Na unavailable. One or more values for this score either were not found within the given timeframe or did not fit some other criterion.   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 LIVER, MASS, NEEDLE CORE BIOPSY: Well-differentiated neuroendocrine tumor, WHO grade 2.   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  No carcinoid symptoms or underlying liver disease. ECOG 1  Treatment Hx Octreotide  somatostatin, q 28 days.  Began 08/14/24, next Rx 12/04/24.  She is interested in pursuing a minimally-invasive option for locoregional liver mestatatic tumor control and we discussed ablative vs transarterial therapies, with radioembolization (TARE) and chemoembolization (TACE). The patient understood that this is not curative and may help  control her disease burden.     Given her relatively focal liver tumor burden I anticipate radioembolization with Y-90 as the best option at this time. The procedure has been fully reviewed with the patient/patients authorized representative. The risks, benefits and alternatives have been explained, and the patient/patients authorized representative has consented to the procedure.   *PET CT (08/08/24) and MR Abdomen (07/15/24) reviewed. Request updated imaging w CT AP  to re-evaluate hepatic tumor burden.  *OK to begin authorization process for LEFT Y-90 mapping and treatment *Will plan for trans-radial access, for both mapping and Rx *Proceed to schedule based on mutual availability. *Same day procedures, no overnight admission. *Will obtain MELD labs (CBC, CMP, INR) and chromogranin-A on procedure day arrival.  Thank you for this interesting consult.  I greatly enjoyed meeting Nicole Holt and look forward to participating in their care.  A copy of this report was sent to the requesting provider on this date.  Electronically Signed:  Thom Hall, MD [facility]   Pager. [phone] Clinic. [phone]  I spent a total of 60 Minutes of non-face-to-face time in clinical consultation, greater than 50% of which was counseling/coordinating care for Nicole Holt*'s metastatic cancer to liver.  The patient was physically located in   or a state in which I am permitted to provide care. The encounter was reasonable and appropriate under the circumstances given the patient's presentation at the time. "

## 2024-11-10 ENCOUNTER — Ambulatory Visit
Admission: RE | Admit: 2024-11-10 | Discharge: 2024-11-10 | Disposition: A | Source: Ambulatory Visit | Attending: Oncology

## 2024-11-10 DIAGNOSIS — C7A8 Other malignant neuroendocrine tumors: Secondary | ICD-10-CM

## 2024-11-10 MED ORDER — IOPAMIDOL (ISOVUE-300) INJECTION 61%
100.0000 mL | Freq: Once | INTRAVENOUS | Status: AC | PRN
  Administered 2024-11-10: 100 mL via INTRAVENOUS

## 2024-11-15 ENCOUNTER — Other Ambulatory Visit (HOSPITAL_COMMUNITY): Payer: Self-pay | Admitting: Interventional Radiology

## 2024-11-15 DIAGNOSIS — C787 Secondary malignant neoplasm of liver and intrahepatic bile duct: Secondary | ICD-10-CM

## 2024-11-27 ENCOUNTER — Ambulatory Visit: Payer: Medicare HMO

## 2024-11-27 VITALS — Ht 62.0 in | Wt 165.0 lb

## 2024-11-27 DIAGNOSIS — Z0000 Encounter for general adult medical examination without abnormal findings: Secondary | ICD-10-CM

## 2024-11-27 NOTE — Patient Instructions (Signed)
 Ms. Warrior,  Thank you for taking the time for your Medicare Wellness Visit. I appreciate your continued commitment to your health goals. Please review the care plan we discussed, and feel free to reach out if I can assist you further.  Please note that Annual Wellness Visits do not include a physical exam. Some assessments may be limited, especially if the visit was conducted virtually. If needed, we may recommend an in-person follow-up with your provider.  Ongoing Care Seeing your primary care provider every 3 to 6 months helps us  monitor your health and provide consistent, personalized care.   Referrals If a referral was made during today's visit and you haven't received any updates within two weeks, please contact the referred provider directly to check on the status.  Recommended Screenings:  Health Maintenance  Topic Date Due   Zoster (Shingles) Vaccine (1 of 2) 06/02/1960   COVID-19 Vaccine (5 - 2025-26 season) 07/04/2025*   Flu Shot  09/28/2027*   Osteoporosis screening with Bone Density Scan  04/16/2025   Medicare Annual Wellness Visit  11/27/2025   DTaP/Tdap/Td vaccine (3 - Td or Tdap) 11/12/2030   Pneumococcal Vaccine for age over 47  Completed   Meningitis B Vaccine  Aged Out   Breast Cancer Screening  Discontinued   Hepatitis C Screening  Discontinued  *Topic was postponed. The date shown is not the original due date.       11/27/2024   12:48 PM  Advanced Directives  Does Patient Have a Medical Advance Directive? Yes  Type of Advance Directive Living will    Vision: Annual vision screenings are recommended for early detection of glaucoma, cataracts, and diabetic retinopathy. These exams can also reveal signs of chronic conditions such as diabetes and high blood pressure.  Dental: Annual dental screenings help detect early signs of oral cancer, gum disease, and other conditions linked to overall health, including heart disease and diabetes.  Please see the  attached documents for additional preventive care recommendations.

## 2024-11-27 NOTE — Progress Notes (Signed)
 "  Chief Complaint  Patient presents with   Medicare Wellness     Subjective:   Nicole Holt is a 84 y.o. female who presents for a Medicare Annual Wellness Visit.  Visit info / Clinical Intake: Medicare Wellness Visit Type:: Subsequent Annual Wellness Visit Persons participating in visit and providing information:: patient Medicare Wellness Visit Mode:: Telephone If telephone:: video declined Since this visit was completed virtually, some vitals may be partially provided or unavailable. Missing vitals are due to the limitations of the virtual format.: Unable to obtain vitals - no equipment If Telephone or Video please confirm:: I connected with patient using audio/video enable telemedicine. I verified patient identity with two identifiers, discussed telehealth limitations, and patient agreed to proceed. Patient Location:: home Provider Location:: office Interpreter Needed?: No Pre-visit prep was completed: yes AWV questionnaire completed by patient prior to visit?: no Living arrangements:: lives with spouse/significant other Patient's Overall Health Status Rating: good Typical amount of pain: none Does pain affect daily life?: no Are you currently prescribed opioids?: no (not taking at this time)  Dietary Habits and Nutritional Risks How many meals a day?: 3 Eats fruit and vegetables daily?: yes Most meals are obtained by: preparing own meals In the last 2 weeks, have you had any of the following?: none Diabetic:: no  Functional Status Activities of Daily Living (to include ambulation/medication): Independent Ambulation: Independent with device- listed below Home Assistive Devices/Equipment: CPAP Medication Administration: Independent Home Management (perform basic housework or laundry): Independent Manage your own finances?: yes Primary transportation is: driving Concerns about vision?: no *vision screening is required for WTM* Concerns about hearing?: (!) yes Uses  hearing aids?: (!) yes Hear whispered voice?: yes  Fall Screening Falls in the past year?: 0 Number of falls in past year: 0 Was there an injury with Fall?: 0 Fall Risk Category Calculator: 0 Patient Fall Risk Level: Low Fall Risk  Fall Risk Patient at Risk for Falls Due to: No Fall Risks Fall risk Follow up: Falls evaluation completed  Home and Transportation Safety: All rugs have non-skid backing?: yes All stairs or steps have railings?: yes Grab bars in the bathtub or shower?: yes Have non-skid surface in bathtub or shower?: (!) no Good home lighting?: yes Regular seat belt use?: yes Hospital stays in the last year:: no  Cognitive Assessment Difficulty concentrating, remembering, or making decisions? : no Will 6CIT or Mini Cog be Completed: yes What year is it?: 0 points What month is it?: 0 points Give patient an address phrase to remember (5 components): [address] [place]  About what time is it?: 0 points Count backwards from 20 to 1: 0 points Say the months of the year in reverse: 0 points Repeat the address phrase from earlier: 2 points 6 CIT Score: 2 points  Advance Directives (For Healthcare) Does Patient Have a Medical Advance Directive?: Yes Does patient want to make changes to medical advance directive?: No - Patient declined Type of Advance Directive: Living will Copy of Healthcare Power of Attorney in Chart?: Yes - validated most recent copy scanned in chart (See row information) Copy of Living Will in Chart?: Yes - validated most recent copy scanned in chart (See row information)  Reviewed/Updated  Reviewed/Updated: Reviewed All (Medical, Surgical, Family, Medications, Allergies, Care Teams, Patient Goals)    Allergies (verified) Codeine, Estrogens conjugated, and Paroxetine hcl   Current Medications (verified) Outpatient Encounter Medications as of 11/27/2024  Medication Sig   diazepam  (VALIUM ) 5 MG tablet Take 1  tablet (5 mg total) by mouth  every 6 (six) hours as needed. for anxiety   fluticasone  (FLONASE ) 50 MCG/ACT nasal spray Use 1 spray(s) in each nostril once daily   latanoprost (XALATAN) 0.005 % ophthalmic solution    traMADol  (ULTRAM ) 50 MG tablet Take 1 tablet (50 mg total) by mouth every 8 (eight) hours as needed. (Patient not taking: Reported on 11/27/2024)   [DISCONTINUED] busPIRone  (BUSPAR ) 5 MG tablet Take 1 tablet (5 mg total) by mouth 2 (two) times daily as needed (anxiety). (Patient not taking: Reported on 10/09/2024)   No facility-administered encounter medications on file as of 11/27/2024.    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    Osteoporosis    Sleep apnea    Tinnitus    Past Surgical History:  Procedure Laterality Date   AIR/FLUID EXCHANGE Right 09/12/2020   Procedure: AIR/FLUID EXCHANGE;  Surgeon: Valdemar Rogue, MD;  Location: [facility] OR;  Service: Ophthalmology;  Laterality: Right;   CATARACT EXTRACTION Bilateral OD: 10.25.21 OS: 12.2015   Dr. Cyrus   cataract surgery     COSMETIC SURGERY     EYE SURGERY Bilateral    Cat Sx - Dr. Cyrus   EYE SURGERY Right 09/12/2020   PPV/EL for retained lens material - Dr. Rogue Valdemar   IR RADIOLOGIST EVAL & MGMT  11/08/2024   PARS PLANA VITRECTOMY Right 09/12/2020   Procedure: PARS PLANA VITRECTOMY WITH 23 GAUGE;  Surgeon: Valdemar Rogue, MD;  Location: [facility] OR;  Service: Ophthalmology;  Laterality: Right;   PHOTOCOAGULATION WITH LASER Right 09/12/2020   Procedure: PHOTOCOAGULATION WITH LASER;  Surgeon: Valdemar Rogue, MD;  Location: [facility] OR;  Service: Ophthalmology;  Laterality:  Right;   REMOVAL RETAINED LENS Right 09/12/2020   Procedure: REMOVAL RETAINED LENS;  Surgeon: Valdemar Rogue, MD;  Location: [facility] OR;  Service: Ophthalmology;  Laterality: Right;   Family History  Problem Relation Age of Onset   Osteoporosis Mother    Stroke Mother    Hyperlipidemia Mother    Hypertension Mother    Kidney disease Father    Congestive Heart Failure Father    Heart disease Father    Hyperlipidemia Father    Stroke Sister    Parkinson's disease Sister    Heart disease Sister    Heart disease Maternal Grandmother    Gout Maternal Grandmother    Stroke Maternal Grandfather    Stroke Paternal Grandmother    Thrombosis Paternal Grandfather    Early death Paternal Grandfather    Hypertension Brother    Diabetes Brother    Arthritis/Rheumatoid Other    Parkinson's disease Other    Heart disease Other    Obesity Daughter    Breast cancer Neg Hx    Social History   Occupational History   Occupation: Retired  Tobacco Use   Smoking status: Former   Smokeless tobacco: Never   Tobacco comments:    Quit in 1987  Vaping Use   Vaping status: Never  Used  Substance and Sexual Activity   Alcohol use: Not Currently    Alcohol/week: 12.0 standard drinks of alcohol    Types: 12 Glasses of wine per week    Comment: wine or alcohol daily   Drug use: No   Sexual activity: Yes    Birth control/protection: Post-menopausal   Tobacco Counseling Counseling given: Not Answered Tobacco comments: Quit in 1987  SDOH Screenings   Food Insecurity: No Food Insecurity (11/27/2024)  Housing: Low Risk (11/27/2024)  Transportation Needs: No Transportation Needs (11/27/2024)  Utilities: Not At Risk (11/27/2024)  Alcohol Screen: Low Risk (11/27/2024)  Depression (PHQ2-9): Low Risk (11/27/2024)  Financial Resource Strain: Low Risk (11/23/2023)  Physical Activity: Inactive (11/27/2024)  Social Connections: Moderately Isolated (11/27/2024)  Stress: No Stress Concern Present (11/27/2024)   Tobacco Use: Medium Risk (11/27/2024)  Health Literacy: Adequate Health Literacy (11/27/2024)   See flowsheets for full screening details  Depression Screen PHQ 2 & 9 Depression Scale- Over the past 2 weeks, how often have you been bothered by any of the following problems? Little interest or pleasure in doing things: 0 Feeling down, depressed, or hopeless (PHQ Adolescent also includes...irritable): 0 PHQ-2 Total Score: 0 Trouble falling or staying asleep, or sleeping too much: 1 Feeling tired or having little energy: 1 Poor appetite or overeating (PHQ Adolescent also includes...weight loss): 0 Feeling bad about yourself - or that you are a failure or have let yourself or your family down: 1 Trouble concentrating on things, such as reading the newspaper or watching television (PHQ Adolescent also includes...like school work): 0 Moving or speaking so slowly that other people could have noticed. Or the opposite - being so fidgety or restless that you have been moving around a lot more than usual: 1 Thoughts that you would be better off dead, or of hurting yourself in some way: 0 PHQ-9 Total Score: 4 If you checked off any problems, how difficult have these problems made it for you to do your work, take care of things at home, or get along with other people?: Somewhat difficult  Depression Treatment Depression Interventions/Treatment : Medication; Counseling     Goals Addressed               This Visit's Progress     continue to not drink alcohol and eat well (pt-stated)               Objective:    Today's Vitals   11/27/24 1244  Weight: 165 lb (74.8 kg)  Height: 5' 2 (1.575 m)   Body mass index is 30.18 kg/m.  Hearing/Vision screen No results found. Immunizations and Health Maintenance Health Maintenance  Topic Date Due   Zoster Vaccines- Shingrix (1 of 2) 06/02/1960   COVID-19 Vaccine (5 - 2025-26 season) 07/04/2025 (Originally 07/03/2024)   Influenza Vaccine   09/28/2027 (Originally 06/02/2024)   Bone Density Scan  04/16/2025   Medicare Annual Wellness (AWV)  11/27/2025   DTaP/Tdap/Td (3 - Td or Tdap) 11/12/2030   Pneumococcal Vaccine: 50+ Years  Completed   Meningococcal B Vaccine  Aged Out   Mammogram  Discontinued   Hepatitis C Screening  Discontinued        Assessment/Plan:  This is a routine wellness examination for Nicole Holt.  Patient Care Team: Job Lukes, [place] as PCP - General (Physician Assistant) Gladis, Inocente Coy, NP as Nurse Practitioner (Neurology) Dohmeier, Dedra, MD as Consulting Physician (Neurology) [facility], [facility] Keri Pandora Cadet, [facility] as Pharmacist (Pharmacist) Autumn Millman, MD as Consulting Physician (  Oncology) Fleeta Zerita DASEN, MD as Consulting Physician (Ophthalmology)  I have personally reviewed and noted the following in the patients chart:   Medical and social history Use of alcohol, tobacco or illicit drugs  Current medications and supplements including opioid prescriptions. Functional ability and status Nutritional status Physical activity Advanced directives List of other physicians Hospitalizations, surgeries, and ER visits in previous 12 months Vitals Screenings to include cognitive, depression, and falls Referrals and appointments  No orders of the defined types were placed in this encounter.  In addition, I have reviewed and discussed with patient certain preventive protocols, quality metrics, and best practice recommendations. A written personalized care plan for preventive services as well as general preventive health recommendations were provided to patient.   Nicole VEAR Haws, LPN   8/73/7973   Return in about 1 year (around 11/27/2025).  After Visit Summary: (MyChart) Due to this being a telephonic visit, the after visit summary with patients personalized plan was offered to patient via MyChart   Nurse Notes: HM Addressed: Vaccines Due: and discussed  "

## 2024-11-29 ENCOUNTER — Other Ambulatory Visit: Payer: Self-pay

## 2024-11-29 ENCOUNTER — Other Ambulatory Visit: Payer: Self-pay | Admitting: Radiology

## 2024-11-29 DIAGNOSIS — C787 Secondary malignant neoplasm of liver and intrahepatic bile duct: Secondary | ICD-10-CM

## 2024-11-29 NOTE — H&P (Signed)
 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  Patient was seen in consultation by Dr. Hughes on 11/08/2024 and deemed an appropriate candidate for hepatic Y90 radioembolization.  She presents today for the arterial roadmapping study, possible embolization as well as test Y90 dosing.  She is familiar to IR team from liver mass biopsy on 07/26/2024.  *** Patient is Full Code  Past Medical History:  Diagnosis Date   Allergy    Anxiety    panic attack   Depression    GERD (gastroesophageal reflux disease)    Glaucoma    Glaucoma    Hearing loss    History of basal cell cancer 08/19/2012   Removed from nose   Hyperlipidemia    Hypertensive retinopathy    OU   Migraines    ocular   OSA on CPAP 02/02/2014   PSG 09-27-2012 RDI 18.9 and AHi 9.8, Epworth 15 .titrated to only 5 cm water .    Osteoporosis    Sleep apnea    Tinnitus     Past Surgical History:  Procedure Laterality Date   AIR/FLUID EXCHANGE Right 09/12/2020   Procedure: AIR/FLUID EXCHANGE;  Surgeon: Valdemar Rogue, MD;  Location: [facility] OR;  Service: Ophthalmology;  Laterality: Right;   CATARACT EXTRACTION Bilateral OD: 10.25.21 OS: 12.2015   Dr. Cyrus   cataract surgery     COSMETIC SURGERY     EYE SURGERY Bilateral    Cat Sx - Dr. Cyrus   EYE SURGERY Right 09/12/2020   PPV/EL for  retained lens material - Dr. Rogue Valdemar   IR RADIOLOGIST EVAL & MGMT  11/08/2024   PARS PLANA VITRECTOMY Right 09/12/2020   Procedure: PARS PLANA VITRECTOMY WITH 23 GAUGE;  Surgeon: Valdemar Rogue, MD;  Location: [facility] OR;  Service: Ophthalmology;  Laterality: Right;   PHOTOCOAGULATION WITH LASER Right 09/12/2020   Procedure: PHOTOCOAGULATION WITH LASER;  Surgeon: Valdemar Rogue, MD;  Location: [facility] OR;  Service: Ophthalmology;  Laterality: Right;   REMOVAL RETAINED LENS Right 09/12/2020   Procedure: REMOVAL RETAINED LENS;  Surgeon: Valdemar Rogue, MD;  Location: [facility] OR;  Service: Ophthalmology;  Laterality: Right;    Allergies: Codeine, Estrogens conjugated, and Paroxetine hcl  Medications: Prior to Admission medications  Medication Sig Start Date End Date Taking? Authorizing Provider  diazepam  (VALIUM ) 5 MG tablet Take 1 tablet (5 mg total) by mouth every 6 (six) hours as needed. for anxiety 07/04/24   Job Lukes, PA  fluticasone  (FLONASE ) 50 MCG/ACT nasal spray Use 1 spray(s) in each nostril once daily 09/07/23   Worley, Samantha, PA  latanoprost (XALATAN) 0.005 % ophthalmic solution  05/16/21   [provider]  traMADol  (ULTRAM ) 50 MG tablet Take 1 tablet (50 mg total) by mouth every 8 (eight) hours as needed. Patient not taking: Reported on 11/27/2024 07/31/24   Autumn Millman, MD     Family History  Problem Relation Age of Onset   Osteoporosis Mother    Stroke Mother    Hyperlipidemia Mother    Hypertension  Mother    Kidney disease Father    Congestive Heart Failure Father    Heart disease Father    Hyperlipidemia Father    Stroke Sister    Parkinson's disease Sister    Heart disease Sister    Heart disease Maternal Grandmother    Gout Maternal Grandmother    Stroke Maternal Grandfather    Stroke Paternal Grandmother    Thrombosis Paternal Grandfather    Early death Paternal Grandfather    Hypertension Brother    Diabetes Brother    Arthritis/Rheumatoid Other     Parkinson's disease Other    Heart disease Other    Obesity Daughter    Breast cancer Neg Hx     Social History   Socioeconomic History   Marital status: Married    Spouse name: Ozell   Number of children: 2   Years of education: 16   Highest education level: Some college, no degree  Occupational History   Occupation: Retired  Tobacco Use   Smoking status: Former   Smokeless tobacco: Never   Tobacco comments:    Quit in 1987  Vaping Use   Vaping status: Never Used  Substance and Sexual Activity   Alcohol use: Not Currently    Alcohol/week: 12.0 standard drinks of alcohol    Types: 12 Glasses of wine per week    Comment: wine or alcohol daily   Drug use: No   Sexual activity: Yes    Birth control/protection: Post-menopausal  Other Topics Concern   Not on file  Social History Narrative   Patient is married Nicole Holt) and lives with her husband.Patient is retired.Patient has two children.Patient is right-handed.Patient has a college education.Patient drinks two cups of coffee daily.      Right handed    Drinks 2 cups per day    Social Drivers of Health   Tobacco Use: Medium Risk (11/27/2024)   Patient History    Smoking Tobacco Use: Former    Smokeless Tobacco Use: Never    Passive Exposure: Not on file  Financial Resource Strain: Low Risk (11/23/2023)   Overall Financial Resource Strain (CARDIA)    Difficulty of Paying Living Expenses: Not hard at all  Food Insecurity: No Food Insecurity (11/27/2024)   Epic    Worried About Radiation Protection Practitioner of Food in the Last Year: Never true    Ran Out of Food in the Last Year: Never true  Transportation Needs: No Transportation Needs (11/27/2024)   Epic    Lack of Transportation (Medical): No    Lack of Transportation (Non-Medical): No  Physical Activity: Inactive (11/27/2024)   Exercise Vital Sign    Days of Exercise per Week: 0 days    Minutes of Exercise per Session: 0 min  Stress: No Stress Concern Present (11/27/2024)    [employer] of Occupational Health - Occupational Stress Questionnaire    Feeling of Stress: Only a little  Social Connections: Moderately Isolated (11/27/2024)   Social Connection and Isolation Panel    Frequency of Communication with Friends and Family: Once a week    Frequency of Social Gatherings with Friends and Family: Twice a week    Attends Religious Services: Never    Database Administrator or Organizations: No    Attends Banker Meetings: Never    Marital Status: Married  Depression (PHQ2-9): Low Risk (11/27/2024)   Depression (PHQ2-9)    PHQ-2 Score: 0  Alcohol Screen: Low Risk (11/27/2024)   Alcohol Screen  Last Alcohol Screening Score (AUDIT): 0  Housing: Low Risk (11/27/2024)   Epic    Unable to Pay for Housing in the Last Year: No    Number of Times Moved in the Last Year: 0    Homeless in the Last Year: No  Utilities: Not At Risk (11/27/2024)   Epic    Threatened with loss of utilities: No  Health Literacy: Adequate Health Literacy (11/27/2024)   B1300 Health Literacy    Frequency of need for help with medical instructions: Never       Review of Systems  Vital Signs:   Advance Care Plan: No documents on file   Physical Exam  Imaging: CT ABDOMEN PELVIS W CONTRAST Result Date: 11/10/2024 CLINICAL DATA:  Restaging metastatic pancreatic neuroendocrine tumor to liver. * Tracking Code: BO * EXAM: CT ABDOMEN AND PELVIS WITH CONTRAST TECHNIQUE: Multidetector CT imaging of the abdomen and pelvis was performed using the standard protocol following bolus administration of intravenous contrast. RADIATION DOSE REDUCTION: This exam was performed according to the departmental dose-optimization program which includes automated exposure control, adjustment of the mA and/or kV according to patient size and/or use of iterative reconstruction technique. CONTRAST:  100mL ISOVUE -300 IOPAMIDOL  (ISOVUE -300) INJECTION 61% COMPARISON:  Nuclear medicine PET dated  08/08/2024, CT abdomen and pelvis dated 07/11/2024 FINDINGS: Lower chest: No focal consolidation or pulmonary nodule in the lung bases. No pleural effusion or pneumothorax demonstrated. Partially imaged heart size is normal. Hepatobiliary: Slight interval increase in size of 9.0 x 8.3 cm heterogeneously enhancing mass centered within segment 4, previously 8.6 x 7.2 cm on 08/08/2024, with continued extension into the caudate lobe. No new focal hepatic lesions. Persistent mild intrahepatic bile duct dilation secondary to mass effect from the mass at the hilum. Normal gallbladder. Pancreas: Queried fullness of the pancreatic tail, where there was DOTATATE activity previously. No main pancreatic ductal dilation Spleen: Normal in size without focal abnormality. Adrenals/Urinary Tract: No adrenal nodules. No suspicious renal mass, calculi or hydronephrosis. No focal bladder wall thickening. Stomach/Bowel: Normal appearance of the stomach. No evidence of bowel wall thickening, distention, or inflammatory changes. Colonic diverticulosis without acute diverticulitis. Dilated appendix measures 1.9 cm in diameter, unchanged. Vascular/Lymphatic: Aortic atherosclerosis. No enlarged abdominal or pelvic lymph nodes. Reproductive: Unchanged 4.0 x 3.3 cm left adnexal simple/minimally complicated cyst. No right adnexal mass. Other: No free fluid, fluid collection, or free air. Musculoskeletal: No acute or abnormal lytic or blastic osseous lesions. Subcutaneous emphysema and stranding in the flank bilaterally, likely injection related. Degenerative changes of bilateral hips, right-greater-than-left. IMPRESSION: 1. Slight interval increase in size of 9.0 x 8.3 cm heterogeneously enhancing mass centered within segment 4, previously 8.6 x 7.2 cm on 08/08/2024, with continued extension into the caudate lobe. No new focal hepatic lesions. 2. Queried fullness of the pancreatic tail, where there was DOTATATE activity previously. No main  pancreatic ductal dilation. 3. Unchanged dilated appendix measures 1.9 cm in diameter, which may reflect an appendiceal mucocele. 4. Unchanged 4.0 x 3.3 cm left adnexal simple/minimally complicated cyst. Recommend continued attention on follow-up. 5.  Aortic Atherosclerosis (ICD10-I70.0). Electronically Signed   By: Limin  Xu M.D.   On: 11/10/2024 09:39   IR Radiologist Eval & Mgmt Result Date: 11/08/2024 EXAM: NEW PATIENT OFFICE VISIT CHIEF COMPLAINT: See below HISTORY OF PRESENT ILLNESS: See below REVIEW OF SYSTEMS: See below PHYSICAL EXAMINATION: See below ASSESSMENT AND PLAN: Please refer to completed note in the electronic medical record on  Epic Thom Hall, MD [facility] Electronically Signed   By: Thom Hall M.D.   On: 11/08/2024 14:26    Labs:  CBC: Recent Labs    08/14/24 1107 09/11/24 1135 10/09/24 0903 11/06/24 1410  WBC 5.0 6.8 8.1 7.5  HGB 13.7 14.6 15.4* 14.7  HCT 39.7 44.0 46.2* 44.2  PLT 280 242 275 252    COAGS: Recent Labs    07/13/24 1038 07/26/24 0728  INR 1.1* 1.0    BMP: Recent Labs    08/14/24 1107 09/11/24 1135 10/09/24 0903 11/06/24 1410  NA 140 138 141 139  K 4.4 4.3 4.1 4.5  CL 104 101 101 101  CO2 25 27 27 29   GLUCOSE 108* 122* 109* 91  BUN 17 18 20 16   CALCIUM  10.0 10.0 10.5* 10.1  CREATININE 0.97 0.90 0.97 0.90  GFRNONAA 58* >60 58* >60    LIVER FUNCTION TESTS: Recent Labs    08/14/24 1107 09/11/24 1135 10/09/24 0903 11/06/24 1410  BILITOT 1.1 0.7 0.5 0.6  AST 44* 44* 50* 59*  ALT 43 51* 53* 74*  ALKPHOS 202* 151* 241* 259*  PROT 7.0 7.4 7.8 7.4  ALBUMIN  4.2 4.5 4.5 4.4    TUMOR MARKERS: Recent Labs    07/21/24 1333  CEA 2.00    Assessment and Plan: 84 y.o. 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  Patient was seen in consultation by Dr. Hall on 11/08/2024 and deemed an appropriate candidate for hepatic Y90 radioembolization.  She presents today for the arterial roadmapping study, possible embolization as well as test Y90 dosing.  She is familiar to IR team from liver mass biopsy on 07/26/2024.Risks and benefits of procedure were discussed with the patient including, but not limited to bleeding, infection, vascular injury or contrast induced renal failure.  This interventional procedure involves the use of X-rays and because of the nature of the planned procedure, it is possible that we will have prolonged use of X-ray fluoroscopy.  Potential radiation risks to you include (but are not limited to) the following: - A slightly elevated risk for cancer  several years later in life. This risk is typically less than 0.5% percent. This risk is low in comparison to the normal incidence of human cancer, which is 33% for women and 50% for men according to the [employer] Society. - Radiation induced injury can include skin redness, resembling a rash, tissue breakdown / ulcers and hair loss (which can be temporary or permanent).   The likelihood of either of these occurring depends on the difficulty of the procedure and whether you are sensitive to radiation due to previous procedures, disease, or genetic conditions.   IF your procedure requires a prolonged use of radiation, you will be notified and given written instructions for further action.  It is your responsibility to monitor the irradiated area for the 2 weeks following the procedure and to notify your physician if you are concerned that you have suffered a radiation induced injury.    All of the patient's questions were answered, patient is agreeable to proceed.  Consent signed and in chart.      Thank you for  allowing our service to participate in Nicole Holt 's care.  Electronically Signed: D. Franky Rakers, PA-C   11/29/2024, 8:36 PM  I spent a total of   25 minutes  in face to face in clinical consultation, greater than 50% of which was counseling/coordinating care for visceral /hepatic arteriogram with possible embolization/test Y90 dosing   "

## 2024-11-30 ENCOUNTER — Other Ambulatory Visit (HOSPITAL_COMMUNITY): Payer: Self-pay | Admitting: Interventional Radiology

## 2024-11-30 ENCOUNTER — Ambulatory Visit (HOSPITAL_COMMUNITY)
Admission: RE | Admit: 2024-11-30 | Discharge: 2024-11-30 | Disposition: A | Discharge status: Home / Self Care | Source: Ambulatory Visit | Attending: Interventional Radiology | Admitting: Interventional Radiology

## 2024-11-30 ENCOUNTER — Encounter (HOSPITAL_COMMUNITY): Payer: Self-pay

## 2024-11-30 ENCOUNTER — Encounter (HOSPITAL_COMMUNITY)
Admission: RE | Admit: 2024-11-30 | Discharge: 2024-11-30 | Disposition: A | Discharge status: Home / Self Care | Source: Ambulatory Visit | Attending: Interventional Radiology | Admitting: Interventional Radiology

## 2024-11-30 ENCOUNTER — Other Ambulatory Visit: Payer: Self-pay

## 2024-11-30 DIAGNOSIS — C787 Secondary malignant neoplasm of liver and intrahepatic bile duct: Secondary | ICD-10-CM

## 2024-11-30 DIAGNOSIS — H409 Unspecified glaucoma: Secondary | ICD-10-CM | POA: Insufficient documentation

## 2024-11-30 DIAGNOSIS — F32A Depression, unspecified: Secondary | ICD-10-CM | POA: Insufficient documentation

## 2024-11-30 DIAGNOSIS — Z85828 Personal history of other malignant neoplasm of skin: Secondary | ICD-10-CM | POA: Insufficient documentation

## 2024-11-30 DIAGNOSIS — C7A8 Other malignant neuroendocrine tumors: Secondary | ICD-10-CM | POA: Insufficient documentation

## 2024-11-30 DIAGNOSIS — F419 Anxiety disorder, unspecified: Secondary | ICD-10-CM | POA: Insufficient documentation

## 2024-11-30 DIAGNOSIS — G4730 Sleep apnea, unspecified: Secondary | ICD-10-CM | POA: Insufficient documentation

## 2024-11-30 DIAGNOSIS — M810 Age-related osteoporosis without current pathological fracture: Secondary | ICD-10-CM | POA: Insufficient documentation

## 2024-11-30 DIAGNOSIS — E785 Hyperlipidemia, unspecified: Secondary | ICD-10-CM | POA: Insufficient documentation

## 2024-11-30 DIAGNOSIS — K219 Gastro-esophageal reflux disease without esophagitis: Secondary | ICD-10-CM | POA: Insufficient documentation

## 2024-11-30 LAB — COMPREHENSIVE METABOLIC PANEL WITH GFR
ALT: 72 U/L — ABNORMAL HIGH (ref 0–44)
AST: 64 U/L — ABNORMAL HIGH (ref 15–41)
Albumin: 4.6 g/dL (ref 3.5–5.0)
Alkaline Phosphatase: 300 U/L — ABNORMAL HIGH (ref 38–126)
Anion gap: 12 (ref 5–15)
BUN: 18 mg/dL (ref 8–23)
CO2: 25 mmol/L (ref 22–32)
Calcium: 10.2 mg/dL (ref 8.9–10.3)
Chloride: 101 mmol/L (ref 98–111)
Creatinine, Ser: 0.95 mg/dL (ref 0.44–1.00)
GFR, Estimated: 60 mL/min — ABNORMAL LOW
Glucose, Bld: 113 mg/dL — ABNORMAL HIGH (ref 70–99)
Potassium: 4.8 mmol/L (ref 3.5–5.1)
Sodium: 138 mmol/L (ref 135–145)
Total Bilirubin: 0.6 mg/dL (ref 0.0–1.2)
Total Protein: 8 g/dL (ref 6.5–8.1)

## 2024-11-30 LAB — CBC WITH DIFFERENTIAL/PLATELET
Abs Immature Granulocytes: 0.03 10*3/uL (ref 0.00–0.07)
Basophils Absolute: 0.1 10*3/uL (ref 0.0–0.1)
Basophils Relative: 1 %
Eosinophils Absolute: 0.1 10*3/uL (ref 0.0–0.5)
Eosinophils Relative: 1 %
HCT: 46.8 % — ABNORMAL HIGH (ref 36.0–46.0)
Hemoglobin: 15.7 g/dL — ABNORMAL HIGH (ref 12.0–15.0)
Immature Granulocytes: 0 %
Lymphocytes Relative: 34 %
Lymphs Abs: 2.7 10*3/uL (ref 0.7–4.0)
MCH: 31.7 pg (ref 26.0–34.0)
MCHC: 33.5 g/dL (ref 30.0–36.0)
MCV: 94.5 fL (ref 80.0–100.0)
Monocytes Absolute: 0.5 10*3/uL (ref 0.1–1.0)
Monocytes Relative: 7 %
Neutro Abs: 4.6 10*3/uL (ref 1.7–7.7)
Neutrophils Relative %: 57 %
Platelets: 257 10*3/uL (ref 150–400)
RBC: 4.95 MIL/uL (ref 3.87–5.11)
RDW: 13.6 % (ref 11.5–15.5)
WBC: 8 10*3/uL (ref 4.0–10.5)
nRBC: 0 % (ref 0.0–0.2)

## 2024-11-30 LAB — PROTIME-INR
INR: 2.2 — ABNORMAL HIGH (ref 0.8–1.2)
Prothrombin Time: 25.5 s — ABNORMAL HIGH (ref 11.4–15.2)

## 2024-11-30 MED ORDER — VERAPAMIL HCL 2.5 MG/ML IV SOLN
INTRAVENOUS | Status: AC
  Filled 2024-11-30: qty 2

## 2024-11-30 MED ORDER — ONDANSETRON HCL 4 MG/2ML IJ SOLN: 4.0000 mg | Freq: Four times a day (QID) | INTRAMUSCULAR | Status: DC | PRN

## 2024-11-30 MED ORDER — IOHEXOL 300 MG/ML  SOLN
200.0000 mL | Freq: Once | INTRAMUSCULAR | Status: AC | PRN
  Administered 2024-11-30: 110 mL via INTRA_ARTERIAL

## 2024-11-30 MED ORDER — FREE WATER: 500.0000 mL | Freq: Once | Status: DC

## 2024-11-30 MED ORDER — LIDOCAINE HCL (PF) 1 % IJ SOLN
INTRAMUSCULAR | Status: AC
  Filled 2024-11-30: qty 30

## 2024-11-30 MED ORDER — VERAPAMIL HCL 2.5 MG/ML IV SOLN
INTRA_ARTERIAL | Status: AC | PRN
  Administered 2024-11-30: 6 mL via INTRA_ARTERIAL

## 2024-11-30 MED ORDER — MIDAZOLAM HCL (PF) 2 MG/2ML IJ SOLN
INTRAMUSCULAR | Status: AC | PRN
  Administered 2024-11-30: 1 mg via INTRAVENOUS

## 2024-11-30 MED ORDER — MIDAZOLAM HCL 2 MG/2ML IJ SOLN
INTRAMUSCULAR | Status: AC
  Filled 2024-11-30: qty 2

## 2024-11-30 MED ORDER — TECHNETIUM TO 99M ALBUMIN AGGREGATED
6.3100 | Freq: Once | INTRAVENOUS | Status: AC
  Administered 2024-11-30: 6.31 via INTRAVENOUS

## 2024-11-30 MED ORDER — NITROGLYCERIN IN D5W 100-5 MCG/ML-% IV SOLN
INTRAVENOUS | Status: AC
  Filled 2024-11-30: qty 250

## 2024-11-30 MED ORDER — SODIUM CHLORIDE 0.9% FLUSH: 3.0000 mL | INTRAVENOUS | Status: DC | PRN

## 2024-11-30 MED ORDER — SODIUM CHLORIDE 0.9 % IV SOLN: INTRAVENOUS | Status: DC

## 2024-11-30 MED ORDER — HEPARIN SODIUM (PORCINE) 1000 UNIT/ML IJ SOLN
INTRAMUSCULAR | Status: AC
  Filled 2024-11-30: qty 10

## 2024-11-30 MED ORDER — FENTANYL CITRATE (PF) 100 MCG/2ML IJ SOLN
INTRAMUSCULAR | Status: AC | PRN
  Administered 2024-11-30: 50 ug via INTRAVENOUS

## 2024-11-30 MED ORDER — LIDOCAINE HCL (PF) 1 % IJ SOLN
30.0000 mL | Freq: Once | INTRAMUSCULAR | Status: AC
  Administered 2024-11-30: 2 mL

## 2024-11-30 MED ORDER — OXYCODONE HCL 5 MG PO TABS: 10.0000 mg | ORAL_TABLET | ORAL | Status: DC | PRN

## 2024-11-30 MED ORDER — FENTANYL CITRATE (PF) 100 MCG/2ML IJ SOLN
INTRAMUSCULAR | Status: AC
  Filled 2024-11-30: qty 2

## 2024-11-30 NOTE — Discharge Instructions (Addendum)
 Discharge Instructions:   Please call Interventional Radiology clinic [phone] with any questions or concerns.  You may remove your dressing and shower tomorrow.  Please keep your left arm on a pillow elevated for support and to reduce swelling.  Do not use your left arm to help prevent pressure and bleeding after your procedure for 24 hours.  Please call Interventional Radiology clinic [phone] with any questions or concerns.  You may remove your dressing and shower tomorrow.    Hepatic Artery Radioembolization, Care After The following information offers guidance on how to care for yourself after your procedure. Your health care provider may also give you more specific instructions. If you have problems or questions, contact your health care provider. What can I expect after the procedure? After the procedure, it is possible to have: A slight fever for 7 to 10 days. This may be accompanied by pain, nausea, or vomiting, which is referred to as post-embolization syndrome. You may be given medicine to help relieve these symptoms. If your fever gets worse, tell your health care provider. Tiredness (fatigue). Loss of appetite. This should gradually improve after about 1 week. Abdominal pain on your right side. Soreness and tenderness in your groin area where the needle and catheter were placed (puncture site). Follow these instructions at home: Puncture site care Follow instructions from your health care provider about how to take care of the puncture site. Make sure you: Wash your hands with soap and water  for at least 20 seconds before and after you change your bandage (dressing). If soap and water  are not available, use hand sanitizer. Change your dressing as told by your health care provider. Check your puncture site every day for signs of infection. Check for: More redness, swelling, or pain. Fluid or blood. Warmth. Pus or a bad smell. Activity Rest as told by your health  care provider. Return to your normal activities as told by your health care provider. Ask your health care provider what activities are safe for you. Avoid sitting for a long time without moving. Get up to take short walks every 1-2 hours. This is important to improve blood flow and breathing. Ask for help if you feel weak or unsteady. If you were given a sedative during the procedure, it can affect you for several hours. Do not drive or operate machinery until your health care provider says that it is safe. Do not lift anything that is heavier than 10 lb (4.5 kg), or the limit that you are told, until your health care provider says that it is safe. Medicines Take over-the-counter and prescription medicines only as told by your health care provider. Ask your health care provider if the medicine prescribed to you: Requires you to avoid driving or using machinery. Can cause constipation. You may need to take these actions to prevent or treat constipation: Drink enough fluid to keep your urine pale yellow. Take over-the-counter or prescription medicines. Eat foods that are high in fiber, such as beans, whole grains, and fresh fruits and vegetables. Limit foods that are high in fat and processed sugars, such as fried or sweet foods. General instructions Eat frequent, small meals until your appetite returns. Follow instructions from your health care provider about eating or drinking restrictions. Do not take baths, swim, or use a hot tub until your health care provider approves. You may take showers. Wash your puncture site with mild soap and water , and [place] the area dry. Wear compression stockings as told by your health care  provider. These stockings help to prevent blood clots and reduce swelling in your legs. Keep all follow-up visits. This is important. You may need to have blood tests and imaging tests. Contact a health care provider if: You have any of these signs of infection: More redness,  swelling, or pain around your puncture site. Fluid or blood coming from your puncture site. Warmth coming from your puncture site. Pus or a bad smell coming from your puncture site. You have pain that: Gets worse. Does not get better with medicine. Feels like very bad heartburn. Is in the middle of your abdomen, above your belly button. You have any signs of infection or liver failure, such as: Your skin or the white parts of your eyes turn yellow (jaundice). The color of your urine changes to dark brown. The color of your stool (feces) changes to light yellow. Your abdominal measurement (girth) increases in a short period of time. You gain more than 5 lb (2.3 kg) in a short period of time. Get help right away if: You have a fever that lasts more than 10 days or is higher than what your health care provider told you to expect. You develop any of the following in your legs: Pain. Swelling. Skin that is cold or pale or turns blue. You have chest pain. You have blood in your vomit, saliva, or stool. You have trouble breathing. These symptoms may represent a serious problem that is an emergency. Do not wait to see if the symptoms will go away. Get medical help right away. Call your local emergency services (911 in the [place] Do not drive yourself to the hospital. Summary After the procedure, it is possible to have a slight fever for up to 7-10 days, tiredness, loss of appetite, abdominal pain on the right side, and groin tenderness where the catheter was placed. Do not come in close contact with people for up to a week after your procedure, as told by your health care provider. Follow instructions from your health care provider about how to take care of the puncture site. Contact a health care provider if you have any signs of infection. Get help right away if you develop pain or swelling in your legs or if your legs feel cool or look pale. This information is not intended to replace  advice given to you by your health care provider. Make sure you discuss any questions you have with your health care provider. Document Revised: 09/22/2020 Document Reviewed: 09/22/2020 Elsevier Patient Education  2022 [employer]      Moderate Conscious Sedation, Adult, Care After This sheet gives you information about how to care for yourself after your procedure. Your health care provider may also give you more specific instructions. If you have problems or questions, contact your health care provider. What can I expect after the procedure? After the procedure, it is common to have: Sleepiness for several hours. Impaired judgment for several hours. Difficulty with balance. Vomiting if you eat too soon. Follow these instructions at home: For the time period you were told by your health care provider: Rest. Do not participate in activities where you could fall or become injured. Do not drive or use machinery. Do not drink alcohol. Do not take sleeping pills or medicines that cause drowsiness. Do not make important decisions or sign legal documents. Do not take care of children on your own.      Eating and drinking Follow the diet recommended by your health care provider. Drink enough  fluid to keep your urine pale yellow. If you vomit: Drink water , juice, or soup when you can drink without vomiting. Make sure you have little or no nausea before eating solid foods.   General instructions Take over-the-counter and prescription medicines only as told by your health care provider. Have a responsible adult stay with you for the time you are told. It is important to have someone help care for you until you are awake and alert. Do not smoke. Keep all follow-up visits as told by your health care provider. This is important. Contact a health care provider if: You are still sleepy or having trouble with balance after 24 hours. You feel light-headed. You keep feeling nauseous or you  keep vomiting. You develop a rash. You have a fever. You have redness or swelling around the IV site. Get help right away if: You have trouble breathing. You have new-onset confusion at home. Summary After the procedure, it is common to feel sleepy, have impaired judgment, or feel nauseous if you eat too soon. Rest after you get home. Know the things you should not do after the procedure. Follow the diet recommended by your health care provider and drink enough fluid to keep your urine pale yellow. Get help right away if you have trouble breathing or new-onset confusion at home. This information is not intended to replace advice given to you by your health care provider. Make sure you discuss any questions you have with your health care provider. Document Revised: 02/16/2020 Document Reviewed: 09/14/2019 Elsevier Patient Education  2021 [employer]   AFTER YOUR NEXT PROCEDURE  Radiation precautions For up to a week after your procedure, there will be a small amount of radioactivity near your liver. This is not especially dangerous to other people. However, as told by your health care provider, you should follow these precautions for 7 days: Do not come in close contact with people. Do not sleep in the same bed as someone else. Do not hold children or babies. Do not have contact with pregnant women.  Post Y-90 Radioembolization Discharge Instructions  You have been given a radioactive material during your procedure.  While it is safe for you to be discharged home from the hospital, you need to proceed directly home.    Do not use public transportation, including air travel, lasting more than 2 hours for 1 week.  Avoid crowded public places for 1 week.  Adult visitors should try to avoid close contact with you for 1 week.    Children and pregnant females should not visit or have close contact with you for 1 week.  Items that you touch are not radioactive.  Do not sleep in the  same bed as your partner for 1 week, and a condom should be used for sexual activity during the first 24 hours.  Your blood may be radioactive and caution should be used if any bleeding occurs during the recovery period.  Body fluids may be radioactive for 24 hours.  Wash your hands after voiding.  Men should sit to urinate.  Dispose of any soiled materials (flush down toilet or place in trash at home) during the first day.  Drink 6 to 8 glasses of fluids per day for 5 days to hydrate yourself.  If you need to see a doctor during the first week, you must let them know that you were treated with yttrium-90 microspheres, and will be slightly radioactive.  They can call Interventional Radiology [phone] with any questions.

## 2024-11-30 NOTE — Procedures (Signed)
 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 - diagnostic hepatic angiography was performed.   - successful split dose 99mTc MAA injection into branches of the LEFT hepatic artery. - TR band closure at L wrist at the end of the case  Plan: - Post sheath removal precautions.  - Standard post radial access deflation protocol.   Final report to follow once all images are reviewed and compared with previous studies.  See detailed dictation with images in PACS. The patient tolerated the procedure well without incident or complication and was returned to Recovery in stable condition.    Thom Hall, MD [facility] Rad[facility]   Pager. [phone] Clinic. [phone]

## 2024-11-30 NOTE — Sedation Documentation (Signed)
 RN Lavoris Canizales pulled 4 mg Versed  and 100 mcg Fentanyl  in IR room. Pt. Received 4 mg Versed  and 100 mcg Fentanyl  throughout the procedure.

## 2024-12-02 LAB — CHROMOGRANIN A: Chromogranin A (ng/mL): 60.4 ng/mL (ref 0.0–101.8)

## 2024-12-03 ENCOUNTER — Telehealth: Payer: Self-pay

## 2024-12-03 NOTE — Telephone Encounter (Signed)
 Spoke directly with patient to reschedule appointments to next week as requested by Dr. Autumn, she requested appointments be canceled and for scheduling to call her back on 12/14/2024 to reschedule because she has a procedure next week. Scheduling message sent.

## 2024-12-04 ENCOUNTER — Inpatient Hospital Stay

## 2024-12-04 ENCOUNTER — Inpatient Hospital Stay: Admitting: Oncology

## 2024-12-04 ENCOUNTER — Other Ambulatory Visit (HOSPITAL_COMMUNITY): Payer: Self-pay | Admitting: Interventional Radiology

## 2024-12-04 DIAGNOSIS — C787 Secondary malignant neoplasm of liver and intrahepatic bile duct: Secondary | ICD-10-CM

## 2024-12-08 ENCOUNTER — Telehealth: Payer: Self-pay | Admitting: *Deleted

## 2024-12-08 NOTE — Telephone Encounter (Signed)
 Voice mail from Juliene with [employer] to f/u on fax sent to office. Patient currently receives Sando in office and her insurance policy will cover drug delivered to home with infusion nurse to administer. Before offering this to patient they need to clear w/provider if this would be allowed. Forwarded message to Dr. Clovis nurse

## 2024-12-12 ENCOUNTER — Ambulatory Visit: Payer: Medicare HMO | Admitting: Adult Health

## 2024-12-12 ENCOUNTER — Encounter (HOSPITAL_COMMUNITY)

## 2024-12-12 ENCOUNTER — Ambulatory Visit (HOSPITAL_COMMUNITY)

## 2024-12-12 ENCOUNTER — Other Ambulatory Visit (HOSPITAL_COMMUNITY)

## 2025-01-01 ENCOUNTER — Inpatient Hospital Stay

## 2025-01-01 ENCOUNTER — Inpatient Hospital Stay: Admitting: Oncology

## 2025-01-02 ENCOUNTER — Ambulatory Visit: Admitting: Adult Health

## 2025-11-29 ENCOUNTER — Ambulatory Visit
